# Patient Record
Sex: Female | Born: 1960 | Race: White | Hispanic: No | Marital: Married | Smoking: Former smoker
Health system: Southern US, Community
[De-identification: ages and names within clinical notes are randomized; demographics above are authoritative.]

## PROBLEM LIST (undated history)

## (undated) DIAGNOSIS — G43809 Other migraine, not intractable, without status migrainosus: Secondary | ICD-10-CM

## (undated) DIAGNOSIS — F32A Depression, unspecified: Secondary | ICD-10-CM

## (undated) DIAGNOSIS — M1990 Unspecified osteoarthritis, unspecified site: Secondary | ICD-10-CM

## (undated) DIAGNOSIS — R112 Nausea with vomiting, unspecified: Secondary | ICD-10-CM

## (undated) DIAGNOSIS — E279 Disorder of adrenal gland, unspecified: Secondary | ICD-10-CM

## (undated) DIAGNOSIS — C50919 Malignant neoplasm of unspecified site of unspecified female breast: Secondary | ICD-10-CM

## (undated) DIAGNOSIS — G4730 Sleep apnea, unspecified: Secondary | ICD-10-CM

## (undated) DIAGNOSIS — E039 Hypothyroidism, unspecified: Secondary | ICD-10-CM

## (undated) DIAGNOSIS — M4802 Spinal stenosis, cervical region: Secondary | ICD-10-CM

## (undated) DIAGNOSIS — R42 Dizziness and giddiness: Secondary | ICD-10-CM

## (undated) DIAGNOSIS — Z803 Family history of malignant neoplasm of breast: Secondary | ICD-10-CM

## (undated) DIAGNOSIS — Z8639 Personal history of other endocrine, nutritional and metabolic disease: Secondary | ICD-10-CM

## (undated) DIAGNOSIS — F419 Anxiety disorder, unspecified: Secondary | ICD-10-CM

## (undated) DIAGNOSIS — R7303 Prediabetes: Secondary | ICD-10-CM

## (undated) DIAGNOSIS — M8580 Other specified disorders of bone density and structure, unspecified site: Secondary | ICD-10-CM

## (undated) DIAGNOSIS — IMO0002 Reserved for concepts with insufficient information to code with codable children: Secondary | ICD-10-CM

## (undated) DIAGNOSIS — R251 Tremor, unspecified: Secondary | ICD-10-CM

## (undated) DIAGNOSIS — K219 Gastro-esophageal reflux disease without esophagitis: Secondary | ICD-10-CM

## (undated) DIAGNOSIS — E785 Hyperlipidemia, unspecified: Secondary | ICD-10-CM

## (undated) DIAGNOSIS — C3490 Malignant neoplasm of unspecified part of unspecified bronchus or lung: Secondary | ICD-10-CM

## (undated) DIAGNOSIS — G2581 Restless legs syndrome: Secondary | ICD-10-CM

## (undated) DIAGNOSIS — Z9889 Other specified postprocedural states: Secondary | ICD-10-CM

## (undated) DIAGNOSIS — F319 Bipolar disorder, unspecified: Secondary | ICD-10-CM

## (undated) DIAGNOSIS — E0590 Thyrotoxicosis, unspecified without thyrotoxic crisis or storm: Secondary | ICD-10-CM

## (undated) DIAGNOSIS — E079 Disorder of thyroid, unspecified: Secondary | ICD-10-CM

## (undated) DIAGNOSIS — E278 Other specified disorders of adrenal gland: Secondary | ICD-10-CM

## (undated) HISTORY — DX: Prediabetes: R73.03

## (undated) HISTORY — DX: Hyperlipidemia, unspecified: E78.5

## (undated) HISTORY — DX: Gastro-esophageal reflux disease without esophagitis: K21.9

## (undated) HISTORY — DX: Depression, unspecified: F32.A

## (undated) HISTORY — PX: KNEE ARTHROSCOPY WITH EXCISION BAKER'S CYST: SHX5646

## (undated) HISTORY — PX: TUBAL LIGATION: SHX77

## (undated) HISTORY — PX: LAPAROSCOPY: SHX197

## (undated) HISTORY — DX: Reserved for concepts with insufficient information to code with codable children: IMO0002

## (undated) HISTORY — DX: Other specified disorders of bone density and structure, unspecified site: M85.80

## (undated) HISTORY — PX: CYSTECTOMY: SUR359

## (undated) HISTORY — DX: Disorder of thyroid, unspecified: E07.9

## (undated) HISTORY — PX: OTHER SURGICAL HISTORY: SHX169

## (undated) HISTORY — DX: Family history of malignant neoplasm of breast: Z80.3

## (undated) HISTORY — DX: Spinal stenosis, cervical region: M48.02

## (undated) HISTORY — DX: Sleep apnea, unspecified: G47.30

## (undated) HISTORY — DX: Malignant neoplasm of unspecified site of unspecified female breast: C50.919

## (undated) HISTORY — DX: Bipolar disorder, unspecified: F31.9

## (undated) HISTORY — PX: WISDOM TOOTH EXTRACTION: SHX21

## (undated) HISTORY — PX: BREAST LUMPECTOMY: SHX2

## (undated) HISTORY — PX: ANTERIOR / POSTERIOR COMBINED FUSION CERVICAL SPINE: SUR627

## (undated) HISTORY — DX: Malignant neoplasm of unspecified part of unspecified bronchus or lung: C34.90

## (undated) HISTORY — PX: TOTAL MASTECTOMY: SHX6129

---

## 2004-12-26 ENCOUNTER — Observation Stay (HOSPITAL_COMMUNITY): Admission: AD | Admit: 2004-12-26 | Discharge: 2004-12-27 | Payer: Self-pay | Admitting: Neurosurgery

## 2004-12-26 ENCOUNTER — Emergency Department: Payer: Self-pay | Admitting: Internal Medicine

## 2005-11-23 ENCOUNTER — Ambulatory Visit: Payer: Self-pay

## 2006-02-15 ENCOUNTER — Ambulatory Visit: Payer: Self-pay | Admitting: Unknown Physician Specialty

## 2010-01-27 ENCOUNTER — Ambulatory Visit: Payer: Self-pay | Admitting: Family Medicine

## 2011-02-28 ENCOUNTER — Ambulatory Visit: Payer: Self-pay | Admitting: Family Medicine

## 2011-10-09 ENCOUNTER — Ambulatory Visit: Payer: Self-pay | Admitting: Family Medicine

## 2011-10-16 ENCOUNTER — Ambulatory Visit: Payer: Self-pay | Admitting: Family Medicine

## 2012-02-29 ENCOUNTER — Ambulatory Visit: Payer: Self-pay | Admitting: Family Medicine

## 2012-04-24 ENCOUNTER — Ambulatory Visit: Payer: Self-pay | Admitting: Orthopedic Surgery

## 2013-04-09 ENCOUNTER — Ambulatory Visit: Payer: Self-pay | Admitting: Family Medicine

## 2013-04-28 ENCOUNTER — Ambulatory Visit: Payer: Self-pay | Admitting: Gastroenterology

## 2014-05-21 ENCOUNTER — Ambulatory Visit: Payer: Self-pay | Admitting: Family Medicine

## 2014-09-15 DIAGNOSIS — G5600 Carpal tunnel syndrome, unspecified upper limb: Secondary | ICD-10-CM | POA: Insufficient documentation

## 2014-11-17 ENCOUNTER — Other Ambulatory Visit: Payer: Self-pay | Admitting: Neurosurgery

## 2014-11-27 ENCOUNTER — Ambulatory Visit
Admission: RE | Admit: 2014-11-27 | Discharge: 2014-11-27 | Disposition: A | Discharge status: Home / Self Care | Payer: Commercial Managed Care - HMO | Source: Ambulatory Visit | Attending: Neurosurgery | Admitting: Neurosurgery

## 2014-11-27 ENCOUNTER — Other Ambulatory Visit: Payer: Self-pay | Admitting: Neurosurgery

## 2014-11-27 DIAGNOSIS — M7989 Other specified soft tissue disorders: Secondary | ICD-10-CM | POA: Insufficient documentation

## 2014-11-27 DIAGNOSIS — Z9889 Other specified postprocedural states: Secondary | ICD-10-CM | POA: Diagnosis not present

## 2014-11-27 DIAGNOSIS — M542 Cervicalgia: Secondary | ICD-10-CM

## 2014-11-27 DIAGNOSIS — M47816 Spondylosis without myelopathy or radiculopathy, lumbar region: Secondary | ICD-10-CM | POA: Diagnosis present

## 2014-11-27 DIAGNOSIS — M4722 Other spondylosis with radiculopathy, cervical region: Secondary | ICD-10-CM

## 2014-12-24 DIAGNOSIS — G5620 Lesion of ulnar nerve, unspecified upper limb: Secondary | ICD-10-CM | POA: Insufficient documentation

## 2015-01-19 ENCOUNTER — Other Ambulatory Visit: Payer: Self-pay | Admitting: Family Medicine

## 2015-01-20 ENCOUNTER — Other Ambulatory Visit: Payer: Self-pay | Admitting: Family Medicine

## 2015-01-26 NOTE — Telephone Encounter (Signed)
Has been out. Rx sent to her pharmacy.

## 2015-02-18 ENCOUNTER — Other Ambulatory Visit: Payer: Self-pay | Admitting: Family Medicine

## 2015-03-12 ENCOUNTER — Ambulatory Visit
Admission: RE | Admit: 2015-03-12 | Discharge: 2015-03-12 | Disposition: A | Discharge status: Home / Self Care | Payer: Commercial Managed Care - HMO | Source: Ambulatory Visit | Attending: Neurosurgery | Admitting: Neurosurgery

## 2015-03-12 ENCOUNTER — Other Ambulatory Visit: Payer: Self-pay | Admitting: Neurosurgery

## 2015-03-12 DIAGNOSIS — Z981 Arthrodesis status: Secondary | ICD-10-CM | POA: Insufficient documentation

## 2015-03-12 DIAGNOSIS — M542 Cervicalgia: Secondary | ICD-10-CM

## 2015-03-23 ENCOUNTER — Encounter: Payer: Self-pay | Admitting: Family Medicine

## 2015-03-23 ENCOUNTER — Ambulatory Visit (INDEPENDENT_AMBULATORY_CARE_PROVIDER_SITE_OTHER): Payer: Commercial Managed Care - HMO | Admitting: Family Medicine

## 2015-03-23 VITALS — BP 138/73 | HR 88 | Temp 99.2°F | Ht 64.0 in | Wt 193.0 lb

## 2015-03-23 DIAGNOSIS — E039 Hypothyroidism, unspecified: Secondary | ICD-10-CM

## 2015-03-23 DIAGNOSIS — E785 Hyperlipidemia, unspecified: Secondary | ICD-10-CM | POA: Diagnosis not present

## 2015-03-23 DIAGNOSIS — E0590 Thyrotoxicosis, unspecified without thyrotoxic crisis or storm: Secondary | ICD-10-CM | POA: Insufficient documentation

## 2015-03-23 DIAGNOSIS — F3181 Bipolar II disorder: Secondary | ICD-10-CM | POA: Diagnosis not present

## 2015-03-23 DIAGNOSIS — R300 Dysuria: Secondary | ICD-10-CM

## 2015-03-23 DIAGNOSIS — Z79899 Other long term (current) drug therapy: Secondary | ICD-10-CM | POA: Diagnosis not present

## 2015-03-23 DIAGNOSIS — J0110 Acute frontal sinusitis, unspecified: Secondary | ICD-10-CM

## 2015-03-23 LAB — CBC WITH DIFFERENTIAL/PLATELET
HEMATOCRIT: 38.7 % (ref 34.0–46.6)
HEMOGLOBIN: 13.2 g/dL (ref 11.1–15.9)
Lymphocytes Absolute: 1.5 10*3/uL (ref 0.7–3.1)
Lymphs: 23 %
MCH: 30.9 pg (ref 26.6–33.0)
MCHC: 34.1 g/dL (ref 31.5–35.7)
MCV: 91 fL (ref 79–97)
MID (Absolute): 0.5 10*3/uL (ref 0.1–1.6)
MID: 8 %
NEUTROS PCT: 69 %
Neutrophils Absolute: 4.5 10*3/uL (ref 1.4–7.0)
Platelets: 250 10*3/uL (ref 150–379)
RBC: 4.27 x10E6/uL (ref 3.77–5.28)
RDW: 14.3 % (ref 12.3–15.4)
WBC: 6.5 10*3/uL (ref 3.4–10.8)

## 2015-03-23 LAB — MICROSCOPIC EXAMINATION: Renal Epithel, UA: NONE SEEN /hpf

## 2015-03-23 LAB — MICROALBUMIN, URINE WAIVED
Creatinine, Urine Waived: 100 mg/dL (ref 10–300)
Microalb, Ur Waived: 10 mg/L (ref 0–19)
Microalb/Creat Ratio: <30 mg/g (ref <30)

## 2015-03-23 LAB — LIPID PANEL PICCOLO, WAIVED
CHOL/HDL RATIO PICCOLO,WAIVE: 3.2 mg/dL
Cholesterol Piccolo, Waived: 146 mg/dL (ref <200)
HDL Chol Piccolo, Waived: 45 mg/dL — ABNORMAL LOW (ref >59)
LDL CHOL CALC PICCOLO WAIVED: 75 mg/dL (ref <100)
Triglycerides Piccolo,Waived: 127 mg/dL (ref <150)
VLDL Chol Calc Piccolo,Waive: 25 mg/dL (ref <30)

## 2015-03-23 MED ORDER — QUETIAPINE FUMARATE 100 MG PO TABS: 150.0000 mg | ORAL_TABLET | Freq: Every day | ORAL | Status: DC

## 2015-03-23 MED ORDER — PREDNISONE 10 MG PO TABS: 40.0000 mg | ORAL_TABLET | Freq: Every day | ORAL | Status: DC

## 2015-03-23 MED ORDER — AMOXICILLIN-POT CLAVULANATE 875-125 MG PO TABS
1.0000 | ORAL_TABLET | Freq: Two times a day (BID) | ORAL | Status: DC
Start: 2015-03-23 — End: 2015-04-20

## 2015-03-23 MED ORDER — TRAMADOL HCL 50 MG PO TABS: 50.0000 mg | ORAL_TABLET | Freq: Four times a day (QID) | ORAL | Status: DC | PRN

## 2015-03-23 NOTE — Assessment & Plan Note (Signed)
Under good control. Continue current regimen. Recheck 6 months.

## 2015-03-23 NOTE — Assessment & Plan Note (Signed)
Rechecking levels today. Will adjust as needed pending results.

## 2015-03-23 NOTE — Progress Notes (Signed)
BP 138/73 mmHg  Pulse 88  Temp(Src) 99.2 F (37.3 C)  Ht '5\' 4"'$  (1.626 m)  Wt 193 lb (87.544 kg)  BMI 33.11 kg/m2  SpO2 95%   Subjective:    Patient ID: Carol Becker, female    DOB: 1961-03-09, 54 y.o.   MRN: [id number]  HPI: Carol Becker is a 54 y.o. female  Chief Complaint  Patient presents with  . Hypothyroidism  . Hyperlipidemia   UPPER RESPIRATORY TRACT INFECTION x5 days Worst symptom: fatigue Fever: yes Cough: no Shortness of breath: yes Wheezing: no Chest pain: no Chest tightness: no Chest congestion: no Nasal congestion: yes Runny nose: no Post nasal drip: yes Sneezing: no Sore throat: yes- gone now Swollen glands: no Sinus pressure: no Headache: no Face pain: no Toothache: no Ear pain: no  Ear pressure: no  Eyes red/itching:no Eye drainage/crusting: yes  Vomiting: no Rash: no Fatigue: yes Sick contacts: yes Strep contacts: no  Context: stable Recurrent sinusitis: no Relief with OTC cold/cough medications: no  Treatments attempted: none   HYPOTHYROIDISM Thyroid control status:stable Satisfied with current treatment? yes Medication side effects: no Medication compliance: good compliance Recent dose adjustment:no Fatigue: yes Cold intolerance: no Heat intolerance: no Weight gain: yes Weight loss: no Constipation: yes Diarrhea/loose stools: no Palpitations: no Lower extremity edema: no Anxiety/depressed mood: no  HYPERLIPIDEMIA Hyperlipidemia status: excellent compliance Satisfied with current treatment?  yes Side effects:  no Medication compliance: excellent compliance Supplements: none Aspirin:  no Chest pain:  no Coronary artery disease:  no Family history CAD:  yes  Relevant past medical, surgical, family and social history reviewed and updated as indicated. Interim medical history since our last visit reviewed. Allergies and medications reviewed and updated.  Review of Systems  Constitutional: Negative.   HENT:  Positive for congestion.   Respiratory: Negative.   Cardiovascular: Negative.   Gastrointestinal: Negative.   Musculoskeletal:       Leg and feet cramps  Psychiatric/Behavioral: Negative.    Per HPI unless specifically indicated above     Objective:    BP 138/73 mmHg  Pulse 88  Temp(Src) 99.2 F (37.3 C)  Ht '5\' 4"'$  (1.626 m)  Wt 193 lb (87.544 kg)  BMI 33.11 kg/m2  SpO2 95%  Wt Readings from Last 3 Encounters:  03/23/15 193 lb (87.544 kg)  09/11/14 193 lb (87.544 kg)    Physical Exam  Constitutional: She is oriented to person, place, and time. She appears well-developed and well-nourished. No distress.  HENT:  Head: Normocephalic and atraumatic.  Right Ear: Hearing normal.  Left Ear: Hearing normal.  Nose: Nose normal.  Eyes: Conjunctivae and lids are normal. Right eye exhibits no discharge. Left eye exhibits no discharge. No scleral icterus.  Cardiovascular: Normal rate, regular rhythm, normal heart sounds and intact distal pulses.  Exam reveals no gallop and no friction rub.   No murmur heard. Pulmonary/Chest: Effort normal and breath sounds normal. No respiratory distress. She has no wheezes. She has no rales. She exhibits no tenderness.  Musculoskeletal: Normal range of motion.  Neurological: She is alert and oriented to person, place, and time.  Skin: Skin is warm, dry and intact. No rash noted. No erythema. No pallor.  Psychiatric: She has a normal mood and affect. Her speech is normal and behavior is normal. Judgment and thought content normal. Cognition and memory are normal.  Nursing note and vitals reviewed.   No results found for this or any previous visit.    Assessment & Plan:  Problem List Items Addressed This Visit      Endocrine   Thyroid activity decreased    Rechecking levels today. Will adjust as needed pending results.       Relevant Orders   TSH   Comprehensive metabolic panel   CBC With Differential/Platelet   UA/M w/rflx Culture,  Routine     Other   Hyperlipidemia    Under good control. Continue current regimen. Recheck 6 months.       Relevant Orders   Comprehensive metabolic panel   Lipid Panel Piccolo, Waived   CBC With Differential/Platelet   UA/M w/rflx Culture, Routine   Bipolar 2 disorder    Under good control with current regimen. Continue current regimen. Refills given today.        Other Visit Diagnoses    Acute frontal sinusitis, recurrence not specified    -  Primary    Will treat with prednisone burst. If not better in 2-3 days, will start augmentin.     Relevant Medications    amoxicillin-clavulanate (AUGMENTIN) 875-125 MG per tablet    predniSONE (DELTASONE) 10 MG tablet    Long-term use of high-risk medication        Will check CMP due to seroquel.     Relevant Orders    Microalbumin, Urine Waived    CBC With Differential/Platelet    UA/M w/rflx Culture, Routine    Dysuria        Await culture. If + and susceptible to amoxicillin, will treat with augmentin for sinusitis.         Follow up plan: Return in about 6 months (around 09/20/2015) for Physical.

## 2015-03-23 NOTE — Assessment & Plan Note (Signed)
Under good control with current regimen. Continue current regimen. Refills given today.

## 2015-03-24 ENCOUNTER — Telehealth: Payer: Self-pay | Admitting: Family Medicine

## 2015-03-24 DIAGNOSIS — R7301 Impaired fasting glucose: Secondary | ICD-10-CM

## 2015-03-24 DIAGNOSIS — R7303 Prediabetes: Secondary | ICD-10-CM

## 2015-03-24 LAB — COMPREHENSIVE METABOLIC PANEL
ALBUMIN: 4.3 g/dL (ref 3.5–5.5)
ALT: 26 IU/L (ref 0–32)
AST: 21 IU/L (ref 0–40)
Albumin/Globulin Ratio: 1.5 (ref 1.1–2.5)
Alkaline Phosphatase: 79 IU/L (ref 39–117)
BILIRUBIN TOTAL: 0.3 mg/dL (ref 0.0–1.2)
BUN / CREAT RATIO: 12 (ref 9–23)
BUN: 11 mg/dL (ref 6–24)
CO2: 24 mmol/L (ref 18–29)
CREATININE: 0.94 mg/dL (ref 0.57–1.00)
Calcium: 9.7 mg/dL (ref 8.7–10.2)
Chloride: 99 mmol/L (ref 97–108)
GFR calc non Af Amer: 69 mL/min/{1.73_m2} (ref >59)
GFR, EST AFRICAN AMERICAN: 80 mL/min/{1.73_m2} (ref >59)
GLUCOSE: 144 mg/dL — AB (ref 65–99)
Globulin, Total: 2.8 g/dL (ref 1.5–4.5)
Potassium: 4.4 mmol/L (ref 3.5–5.2)
Sodium: 138 mmol/L (ref 134–144)
TOTAL PROTEIN: 7.1 g/dL (ref 6.0–8.5)

## 2015-03-24 LAB — TSH: TSH: 3.08 u[IU]/mL (ref 0.450–4.500)

## 2015-03-24 MED ORDER — LEVOTHYROXINE SODIUM 25 MCG PO TABS: 37.5000 ug | ORAL_TABLET | Freq: Every day | ORAL | Status: DC

## 2015-03-24 NOTE — Telephone Encounter (Signed)
Please let Carol Becker know that her labs came back normal except an elevated sugar- but I don't think she was fasting, if she was, we need to recheck an A1c, so let me know and I'll add it on (she shouldn't need to come in). Her thyroid was right around where it was, so I sent in a years supply for her. Thanks!

## 2015-03-24 NOTE — Telephone Encounter (Signed)
Spoke to patient. A1c added on in office and came back at 6.1. Appt cancelled for tomorrow. Went over results and mailing patient information about diet with prediabetes. Continue to monitor. Recheck A1c in 6 months.

## 2015-03-24 NOTE — Telephone Encounter (Signed)
All she had was some almond milk in her coffee. She will come in for a A1C, appointment is scheduled for 8:00am on 03/25/15. I placed the order for the A1C  FYI: Daughter needed a doctor, she recommended you and she will be coming in on 04/06/15 for a new patient appointment with you.

## 2015-03-24 NOTE — Patient Instructions (Signed)
Diabetes Mellitus and Food It is important for you to manage your blood sugar (glucose) level. Your blood glucose level can be greatly affected by what you eat. Eating healthier foods in the appropriate amounts throughout the day at about the same time each day will help you control your blood glucose level. It can also help slow or prevent worsening of your diabetes mellitus. Healthy eating may even help you improve the level of your blood pressure and reach or maintain a healthy weight.  HOW CAN FOOD AFFECT ME? Carbohydrates Carbohydrates affect your blood glucose level more than any other type of food. Your dietitian will help you determine how many carbohydrates to eat at each meal and teach you how to count carbohydrates. Counting carbohydrates is important to keep your blood glucose at a healthy level, especially if you are using insulin or taking certain medicines for diabetes mellitus. Alcohol Alcohol can cause sudden decreases in blood glucose (hypoglycemia), especially if you use insulin or take certain medicines for diabetes mellitus. Hypoglycemia can be a life-threatening condition. Symptoms of hypoglycemia (sleepiness, dizziness, and disorientation) are similar to symptoms of having too much alcohol.  If your health care provider has given you approval to drink alcohol, do so in moderation and use the following guidelines:  Women should not have more than one drink per day, and men should not have more than two drinks per day. One drink is equal to:  12 oz of beer.  5 oz of wine.  1 oz of hard liquor.  Do not drink on an empty stomach.  Keep yourself hydrated. Have water, diet soda, or unsweetened iced tea.  Regular soda, juice, and other mixers might contain a lot of carbohydrates and should be counted. WHAT FOODS ARE NOT RECOMMENDED? As you make food choices, it is important to remember that all foods are not the same. Some foods have fewer nutrients per serving than other  foods, even though they might have the same number of calories or carbohydrates. It is difficult to get your body what it needs when you eat foods with fewer nutrients. Examples of foods that you should avoid that are high in calories and carbohydrates but low in nutrients include:  Trans fats (most processed foods list trans fats on the Nutrition Facts label).  Regular soda.  Juice.  Candy.  Sweets, such as cake, pie, doughnuts, and cookies.  Fried foods. WHAT FOODS CAN I EAT? Have nutrient-rich foods, which will nourish your body and keep you healthy. The food you should eat also will depend on several factors, including:  The calories you need.  The medicines you take.  Your weight.  Your blood glucose level.  Your blood pressure level.  Your cholesterol level. You also should eat a variety of foods, including:  Protein, such as meat, poultry, fish, tofu, nuts, and seeds (lean animal proteins are best).  Fruits.  Vegetables.  Dairy products, such as milk, cheese, and yogurt (low fat is best).  Breads, grains, pasta, cereal, rice, and beans.  Fats such as olive oil, trans fat-free margarine, canola oil, avocado, and olives. DOES EVERYONE WITH DIABETES MELLITUS HAVE THE SAME MEAL PLAN? Because every person with diabetes mellitus is different, there is not one meal plan that works for everyone. It is very important that you meet with a dietitian who will help you create a meal plan that is just right for you. Document Released: 03/23/2005 Document Revised: 07/01/2013 Document Reviewed: 05/23/2013 [employer] Patient Information 2015 [employer]. This   information is not intended to replace advice given to you by your health care provider. Make sure you discuss any questions you have with your health care provider. Basic Carbohydrate Counting for Diabetes Mellitus Carbohydrate counting is a method for keeping track of the amount of carbohydrates you eat. Eating  carbohydrates naturally increases the level of sugar (glucose) in your blood, so it is important for you to know the amount that is okay for you to have in every meal. Carbohydrate counting helps keep the level of glucose in your blood within normal limits. The amount of carbohydrates allowed is different for every person. A dietitian can help you calculate the amount that is right for you. Once you know the amount of carbohydrates you can have, you can count the carbohydrates in the foods you want to eat. Carbohydrates are found in the following foods:  Grains, such as breads and cereals.  Dried beans and soy products.  Starchy vegetables, such as potatoes, peas, and corn.  Fruit and fruit juices.  Milk and yogurt.  Sweets and snack foods, such as cake, cookies, candy, chips, soft drinks, and fruit drinks. CARBOHYDRATE COUNTING There are two ways to count the carbohydrates in your food. You can use either of the methods or a combination of both. Reading the "Nutrition Facts" on [place] The "Nutrition Facts" is an area that is included on the labels of almost all packaged food and beverages in the [place]. It includes the serving size of that food or beverage and information about the nutrients in each serving of the food, including the grams (g) of carbohydrate per serving.  Decide the number of servings of this food or beverage that you will be able to eat or drink. Multiply that number of servings by the number of grams of carbohydrate that is listed on the label for that serving. The total will be the amount of carbohydrates you will be having when you eat or drink this food or beverage. Learning Standard Serving Sizes of Food When you eat food that is not packaged or does not include "Nutrition Facts" on the label, you need to measure the servings in order to count the amount of carbohydrates.A serving of most carbohydrate-rich foods contains about 15 g of carbohydrates. The  following list includes serving sizes of carbohydrate-rich foods that provide 15 g ofcarbohydrate per serving:   1 slice of bread (1 oz) or 1 six-inch tortilla.    of a hamburger bun or English muffin.  4-6 crackers.   cup unsweetened dry cereal.    cup hot cereal.   cup rice or pasta.    cup mashed potatoes or  of a large baked potato.  1 cup fresh fruit or one small piece of fruit.    cup canned or frozen fruit or fruit juice.  1 cup milk.   cup plain fat-free yogurt or yogurt sweetened with artificial sweeteners.   cup cooked dried beans or starchy vegetable, such as peas, corn, or potatoes.  Decide the number of standard-size servings that you will eat. Multiply that number of servings by 15 (the grams of carbohydrates in that serving). For example, if you eat 2 cups of strawberries, you will have eaten 2 servings and 30 g of carbohydrates (2 servings x 15 g = 30 g). For foods such as soups and casseroles, in which more than one food is mixed in, you will need to count the carbohydrates in each food that is included. EXAMPLE OF CARBOHYDRATE  COUNTING Sample Dinner  3 oz chicken breast.   cup of brown rice.   cup of corn.  1 cup milk.   1 cup strawberries with sugar-free whipped topping.  Carbohydrate Calculation Step 1: Identify the foods that contain carbohydrates:   Rice.   Corn.   Milk.   Strawberries. Step 2:Calculate the number of servings eaten of each:   2 servings of rice.   1 serving of corn.   1 serving of milk.   1 serving of strawberries. Step 3: Multiply each of those number of servings by 15 g:   2 servings of rice x 15 g = 30 g.   1 serving of corn x 15 g = 15 g.   1 serving of milk x 15 g = 15 g.   1 serving of strawberries x 15 g = 15 g. Step 4: Add together all of the amounts to find the total grams of carbohydrates eaten: 30 g + 15 g + 15 g + 15 g = 75 g. Document Released: 06/26/2005 Document  Revised: 11/10/2013 Document Reviewed: 05/23/2013 [facility] Patient Information 2015 [place], [place]. This information is not intended to replace advice given to you by your health care provider. Make sure you discuss any questions you have with your health care provider.

## 2015-03-25 ENCOUNTER — Other Ambulatory Visit: Payer: Self-pay

## 2015-03-25 LAB — UA/M W/RFLX CULTURE, ROUTINE

## 2015-03-26 ENCOUNTER — Other Ambulatory Visit: Payer: Self-pay | Admitting: Family Medicine

## 2015-03-26 DIAGNOSIS — R7301 Impaired fasting glucose: Secondary | ICD-10-CM

## 2015-03-26 LAB — BAYER DCA HB A1C WAIVED: HB A1C (BAYER DCA - WAIVED): 6.1 % (ref <7.0)

## 2015-04-12 ENCOUNTER — Encounter: Payer: Commercial Managed Care - HMO | Admitting: Family Medicine

## 2015-04-20 ENCOUNTER — Encounter: Payer: Self-pay | Admitting: Family Medicine

## 2015-04-20 ENCOUNTER — Ambulatory Visit (INDEPENDENT_AMBULATORY_CARE_PROVIDER_SITE_OTHER): Payer: Commercial Managed Care - HMO | Admitting: Family Medicine

## 2015-04-20 VITALS — BP 126/80 | HR 85 | Temp 98.0°F | Ht 64.3 in | Wt 197.0 lb

## 2015-04-20 DIAGNOSIS — Z1239 Encounter for other screening for malignant neoplasm of breast: Secondary | ICD-10-CM

## 2015-04-20 DIAGNOSIS — E669 Obesity, unspecified: Secondary | ICD-10-CM | POA: Diagnosis not present

## 2015-04-20 DIAGNOSIS — Z0000 Encounter for general adult medical examination without abnormal findings: Secondary | ICD-10-CM | POA: Diagnosis not present

## 2015-04-20 DIAGNOSIS — Z23 Encounter for immunization: Secondary | ICD-10-CM | POA: Diagnosis not present

## 2015-04-20 DIAGNOSIS — Z6835 Body mass index (BMI) 35.0-35.9, adult: Secondary | ICD-10-CM

## 2015-04-20 MED ORDER — LIRAGLUTIDE -WEIGHT MANAGEMENT 18 MG/3ML ~~LOC~~ SOPN: 0.6000 mg | PEN_INJECTOR | Freq: Every day | SUBCUTANEOUS | Status: DC

## 2015-04-20 NOTE — Assessment & Plan Note (Signed)
Has really worked on diet and exercise. Stable to gaining weight. Has comorbidities. Will treat with saxenda if covered and follow up in 1 month.

## 2015-04-20 NOTE — Progress Notes (Signed)
BP 126/80 mmHg  Pulse 85  Temp(Src) 98 F (36.7 C)  Ht 5' 4.3" (1.633 m)  Wt 197 lb (89.359 kg)  BMI 33.51 kg/m2  SpO2 93%   Subjective:    Patient ID: Carol Becker, female    DOB: 1961-01-17, 54 y.o.   MRN: [id number]  HPI: Carol Becker is a 54 y.o. female presenting on 04/20/2015 for comprehensive medical examination. Current medical complaints include: Scratchy throat starting today.   UPPER RESPIRATORY TRACT INFECTION Worst symptom: sore throat Fever: no Cough: yes Shortness of breath: no Wheezing: no Chest pain: no Chest tightness: no Chest congestion: no Nasal congestion: yes Runny nose: yes Post nasal drip: yes Sneezing: no Sore throat: yes Swollen glands: no Sinus pressure: no Headache: no Face pain: no Toothache: no Ear pain: no  Ear pressure: no  Eyes red/itching:no Eye drainage/crusting: no  Vomiting: no Rash: no Fatigue: no Sick contacts: yes Strep contacts: yes  Context: stable Recurrent sinusitis: no Relief with OTC cold/cough medications: no  Treatments attempted: none    She currently lives with: Menopausal Symptoms: no  Depression Screen done today and results listed below:  No flowsheet data found.  The patient does not have a history of falls. I did not complete a risk assessment for falls. A plan of care for falls was not documented.   Past Medical History:  Past Medical History  Diagnosis Date  . Sleep apnea   . Bipolar 1 disorder ([place])   . Osteopenia   . Hyperlipidemia   . Cervical spinal stenosis   . GERD (gastroesophageal reflux disease)   . Thyroid disease     Surgical History:  Past Surgical History  Procedure Laterality Date  . Tubal ligation    . Knee arthroscopy with excision baker's cyst    . Anterior / posterior combined fusion cervical spine      Medications:  Current Outpatient Prescriptions on File Prior to Visit  Medication Sig  . gabapentin (NEURONTIN) 600 MG tablet   .  HYDROcodone-acetaminophen (NORCO) 10-325 MG per tablet TAKE 1/2-1 TABLET BY ORAL ROUTE EVERY 5 HOURS AS NEEDED FOR PAIN  . levothyroxine (SYNTHROID, LEVOTHROID) 25 MCG tablet Take 1.5 tablets (37.5 mcg total) by mouth daily.  . QUEtiapine (SEROQUEL) 100 MG tablet Take 1.5 tablets (150 mg total) by mouth at bedtime.  . simvastatin (ZOCOR) 20 MG tablet TAKE 1 TABLET BY MOUTH EVERY DAY  . traMADol (ULTRAM) 50 MG tablet Take 1 tablet (50 mg total) by mouth every 6 (six) hours as needed. Take1-2 tablets every 6 hours as needed   No current facility-administered medications on file prior to visit.    Allergies:  Allergies  Allergen Reactions  . Codeine Phosphate [Codeine] Other (See Comments)    Jittery  . Crestor [Rosuvastatin Calcium] Other (See Comments)    Indigestion  . Sulfa Antibiotics Nausea Only  . Zithromax [Azithromycin] Other (See Comments)    Not effective    Social History:  Social History   Social History  . Marital Status: Married    Spouse Name: N/A  . Number of Children: N/A  . Years of Education: N/A   Occupational History  . Not on file.   Social History Main Topics  . Smoking status: Former Smoker    Quit date: 04/06/2011  . Smokeless tobacco: Never Used  . Alcohol Use: Yes     Comment: On Occasion  . Drug Use: No  . Sexual Activity: Yes    Birth Control/  Protection: Post-menopausal   Other Topics Concern  . Not on file   Social History Narrative   History  Smoking status  . Former Smoker  . Quit date: 04/06/2011  Smokeless tobacco  . Never Used   History  Alcohol Use  . Yes    Comment: On Occasion    Family History:  Family History  Problem Relation Age of Onset  . Heart disease Father     CAD  . Heart disease Sister 34    CAD  . Cancer Maternal Grandmother     breast  . Cancer Paternal Grandmother     breast  . Alzheimer's disease Maternal Grandfather     Past medical history, surgical history, medications, allergies, family  history and social history reviewed with patient today and changes made to appropriate areas of the chart.   Review of Systems  Constitutional: Negative.   HENT: Negative.   Eyes: Negative.   Respiratory: Negative.   Cardiovascular: Negative.   Gastrointestinal: Positive for constipation. Negative for heartburn, nausea, vomiting, abdominal pain, diarrhea, blood in stool and melena.  Genitourinary: Negative.   Musculoskeletal: Negative.   Skin: Negative.   Neurological: Negative.   Endo/Heme/Allergies: Negative.   Psychiatric/Behavioral: Negative.     All other ROS negative except what is listed above and in the HPI.      Objective:    BP 126/80 mmHg  Pulse 85  Temp(Src) 98 F (36.7 C)  Ht 5' 4.3" (1.633 m)  Wt 197 lb (89.359 kg)  BMI 33.51 kg/m2  SpO2 93%  Wt Readings from Last 3 Encounters:  04/20/15 197 lb (89.359 kg)  03/23/15 193 lb (87.544 kg)  09/11/14 193 lb (87.544 kg)    Physical Exam  Constitutional: She is oriented to person, place, and time. She appears well-developed and well-nourished. No distress.  HENT:  Head: Normocephalic and atraumatic.  Right Ear: Hearing and external ear normal.  Left Ear: Hearing and external ear normal.  Nose: Nose normal.  Mouth/Throat: Oropharynx is clear and moist. No oropharyngeal exudate.  Eyes: Conjunctivae and lids are normal. Pupils are equal, round, and reactive to light. Right eye exhibits no discharge. Left eye exhibits no discharge. No scleral icterus.  Neck: Normal range of motion. Neck supple. No JVD present. No tracheal deviation present. No thyromegaly present.  Cardiovascular: Normal rate, regular rhythm, normal heart sounds and intact distal pulses.  Exam reveals no gallop and no friction rub.   No murmur heard. Pulmonary/Chest: Effort normal and breath sounds normal. No stridor. No respiratory distress. She has no wheezes. She has no rales. She exhibits no tenderness. Right breast exhibits no inverted nipple, no  mass, no nipple discharge, no skin change and no tenderness. Left breast exhibits no inverted nipple, no mass, no nipple discharge, no skin change and no tenderness. Breasts are symmetrical.  Abdominal: Soft. Bowel sounds are normal. She exhibits no distension and no mass. There is no tenderness. There is no rebound and no guarding.  Genitourinary:  Deferred with shared decision making  Musculoskeletal: Normal range of motion. She exhibits no edema or tenderness.  Lymphadenopathy:    She has no cervical adenopathy.  Neurological: She is alert and oriented to person, place, and time. She has normal reflexes. She displays normal reflexes. No cranial nerve deficit. She exhibits normal muscle tone. Coordination normal.  Skin: Skin is warm, dry and intact. No rash noted. She is not diaphoretic. No erythema. No pallor.  Psychiatric: She has a normal mood and affect.  Her speech is normal and behavior is normal. Judgment and thought content normal. Cognition and memory are normal.  Nursing note and vitals reviewed.   Results for orders placed or performed in visit on 03/23/15  Microscopic Examination  Result Value Ref Range   WBC, UA 0-5 0 -  5 /hpf   RBC, UA 0-2 0 -  2 /hpf   Epithelial Cells (non renal) 0-10 0 - 10 /hpf   Renal Epithel, UA None seen None seen /hpf   Casts Present (A) None seen /lpf   Cast Type Hyaline casts N/A   Mucus, UA Present Not Estab.   Bacteria, UA Few None seen/Few  TSH  Result Value Ref Range   TSH 3.080 0.450 - 4.500 uIU/mL  Microalbumin, Urine Waived  Result Value Ref Range   Microalb, Ur Waived 10 0 - 19 mg/L   Creatinine, Urine Waived 100 10 - 300 mg/dL   Microalb/Creat Ratio <30 <30 mg/g  Comprehensive metabolic panel  Result Value Ref Range   Glucose 144 (H) 65 - 99 mg/dL   BUN 11 6 - 24 mg/dL   Creatinine, Ser 0.94 0.57 - 1.00 mg/dL   GFR calc non Af Amer 69 >59 mL/min/1.73   GFR calc Af Amer 80 >59 mL/min/1.73   BUN/Creatinine Ratio 12 9 - 23    Sodium 138 134 - 144 mmol/L   Potassium 4.4 3.5 - 5.2 mmol/L   Chloride 99 97 - 108 mmol/L   CO2 24 18 - 29 mmol/L   Calcium 9.7 8.7 - 10.2 mg/dL   Total Protein 7.1 6.0 - 8.5 g/dL   Albumin 4.3 3.5 - 5.5 g/dL   Globulin, Total 2.8 1.5 - 4.5 g/dL   Albumin/Globulin Ratio 1.5 1.1 - 2.5   Bilirubin Total 0.3 0.0 - 1.2 mg/dL   Alkaline Phosphatase 79 39 - 117 IU/L   AST 21 0 - 40 IU/L   ALT 26 0 - 32 IU/L  Lipid Panel Piccolo, Waived  Result Value Ref Range   Cholesterol Piccolo, Waived 146 <200 mg/dL   HDL Chol Piccolo, Waived 45 (L) >59 mg/dL   Triglycerides Piccolo,Waived 127 <150 mg/dL   Chol/HDL Ratio Piccolo,Waive 3.2 mg/dL   LDL Chol Calc Piccolo Waived 75 <100 mg/dL   VLDL Chol Calc Piccolo,Waive 25 <30 mg/dL  CBC With Differential/Platelet  Result Value Ref Range   WBC 6.5 3.4 - 10.8 x10E3/uL   RBC 4.27 3.77 - 5.28 x10E6/uL   Hemoglobin 13.2 11.1 - 15.9 g/dL   Hematocrit 38.7 34.0 - 46.6 %   MCV 91 79 - 97 fL   MCH 30.9 26.6 - 33.0 pg   MCHC 34.1 31.5 - 35.7 g/dL   RDW 14.3 12.3 - 15.4 %   Platelets 250 150 - 379 x10E3/uL   Neutrophils 69 %   Lymphs 23 %   MID 8 %   Neutrophils Absolute 4.5 1.4 - 7.0 x10E3/uL   Lymphocytes Absolute 1.5 0.7 - 3.1 x10E3/uL   MID (Absolute) 0.5 0.1 - 1.6 X10E3/uL  UA/M w/rflx Culture, Routine  Result Value Ref Range   Urine Culture, Routine Final report    Urine Culture result 1 Comment   Bayer DCA Hb A1c Waived  Result Value Ref Range   Bayer DCA Hb A1c Waived 6.1 <7.0 %      Assessment & Plan:   Problem List Items Addressed This Visit      Other   Obesity    Has really worked on diet  and exercise. Stable to gaining weight. Has comorbidities. Will treat with saxenda if covered and follow up in 1 month.       Relevant Medications   Liraglutide -Weight Management (SAXENDA) 18 MG/3ML SOPN    Other Visit Diagnoses    Routine general medical examination at a health care facility    -  Primary    Up to date on pap and  mammogram and colonoscopy. Up to date on tdap, flu today, pneumovax refused. Screening labs last visit. Continue diet and exercise.     Immunization due        Flu shot given today.     Relevant Orders    Flu Vaccine QUAD 36+ mos PF IM (Fluarix & Fluzone Quad PF) (Completed)    Screening for breast cancer        Mammogram due in November. Ordered today.    Relevant Orders    MM DIGITAL SCREENING BILATERAL        Follow up plan: Return in about 1 month (around 05/21/2015) for follow up weight.   LABORATORY TESTING:  - Pap smear: up to date- due July 2020  IMMUNIZATIONS:   - Tdap: Tetanus vaccination status reviewed: last tetanus booster within 10 years. - Influenza: Up to date - Pneumovax: Refused  SCREENING: -Mammogram: Ordered today  - Colonoscopy: Up to date   PATIENT COUNSELING:   Advised to take 1 mg of folate supplement per day if capable of pregnancy.   Sexuality: Discussed sexually transmitted diseases, partner selection, use of condoms, avoidance of unintended pregnancy  and contraceptive alternatives.   Advised to avoid cigarette smoking.  I discussed with the patient that most people either abstain from alcohol or drink within safe limits (<=14/week and <=4 drinks/occasion for males, <=7/weeks and <= 3 drinks/occasion for females) and that the risk for alcohol disorders and other health effects rises proportionally with the number of drinks per week and how often a drinker exceeds daily limits.  Discussed cessation/primary prevention of drug use and availability of treatment for abuse.   Diet: Encouraged to adjust caloric intake to maintain  or achieve ideal body weight, to reduce intake of dietary saturated fat and total fat, to limit sodium intake by avoiding high sodium foods and not adding table salt, and to maintain adequate dietary potassium and calcium preferably from fresh fruits, vegetables, and low-fat dairy products.    stressed the importance of regular  exercise  Injury prevention: Discussed safety belts, safety helmets, smoke detector, smoking near bedding or upholstery.   Dental health: Discussed importance of regular tooth brushing, flossing, and dental visits.    NEXT PREVENTATIVE PHYSICAL DUE IN 1 YEAR. Return in about 1 month (around 05/21/2015) for follow up weight.

## 2015-04-20 NOTE — Addendum Note (Signed)
Addended by: Valerie Roys on: 04/20/2015 08:52 AM   Modules accepted: Miquel Dunn

## 2015-04-20 NOTE — Patient Instructions (Addendum)
You are due for your mammogram after 05/22/15- your order is in, you can call and schedule whenever you'd like.  Preventive Care for Adults, Female A healthy lifestyle and preventive care can promote health and wellness. Preventive health guidelines for women include the following key practices.  A routine yearly physical is a good way to check with your health care provider about your health and preventive screening. It is a chance to share any concerns and updates on your health and to receive a thorough exam.  Visit your dentist for a routine exam and preventive care every 6 months. Brush your teeth twice a day and floss once a day. Good oral hygiene prevents tooth decay and gum disease.  The frequency of eye exams is based on your age, health, family medical history, use of contact lenses, and other factors. Follow your health care provider's recommendations for frequency of eye exams.  Eat a healthy diet. Foods like vegetables, fruits, whole grains, low-fat dairy products, and lean protein foods contain the nutrients you need without too many calories. Decrease your intake of foods high in solid fats, added sugars, and salt. Eat the right amount of calories for you.Get information about a proper diet from your health care provider, if necessary.  Regular physical exercise is one of the most important things you can do for your health. Most adults should get at least 150 minutes of moderate-intensity exercise (any activity that increases your heart rate and causes you to sweat) each week. In addition, most adults need muscle-strengthening exercises on 2 or more days a week.  Maintain a healthy weight. The body mass index (BMI) is a screening tool to identify possible weight problems. It provides an estimate of body fat based on height and weight. Your health care provider can find your BMI and can help you achieve or maintain a healthy weight.For adults 20 years and older:  A BMI below 18.5 is  considered underweight.  A BMI of 18.5 to 24.9 is normal.  A BMI of 25 to 29.9 is considered overweight.  A BMI of 30 and above is considered obese.  Maintain normal blood lipids and cholesterol levels by exercising and minimizing your intake of saturated fat. Eat a balanced diet with plenty of fruit and vegetables. Blood tests for lipids and cholesterol should begin at age 33 and be repeated every 5 years. If your lipid or cholesterol levels are high, you are over 50, or you are at high risk for heart disease, you may need your cholesterol levels checked more frequently.Ongoing high lipid and cholesterol levels should be treated with medicines if diet and exercise are not working.  If you smoke, find out from your health care provider how to quit. If you do not use tobacco, do not start.  Lung cancer screening is recommended for adults aged 54-80 years who are at high risk for developing lung cancer because of a history of smoking. A yearly low-dose CT scan of the lungs is recommended for people who have at least a 30-pack-year history of smoking and are a current smoker or have quit within the past 15 years. A pack year of smoking is smoking an average of 1 pack of cigarettes a day for 1 year (for example: 1 pack a day for 30 years or 2 packs a day for 15 years). Yearly screening should continue until the smoker has stopped smoking for at least 15 years. Yearly screening should be stopped for people who develop a health problem  that would prevent them from having lung cancer treatment.  If you are pregnant, do not drink alcohol. If you are breastfeeding, be very cautious about drinking alcohol. If you are not pregnant and choose to drink alcohol, do not have more than 1 drink per day. One drink is considered to be 12 ounces (355 mL) of beer, 5 ounces (148 mL) of wine, or 1.5 ounces (44 mL) of liquor.  Avoid use of street drugs. Do not share needles with anyone. Ask for help if you need support or  instructions about stopping the use of drugs.  High blood pressure causes heart disease and increases the risk of stroke. Your blood pressure should be checked at least every 1 to 2 years. Ongoing high blood pressure should be treated with medicines if weight loss and exercise do not work.  If you are 49-23 years old, ask your health care provider if you should take aspirin to prevent strokes.  Diabetes screening is done by taking a blood sample to check your blood glucose level after you have not eaten for a certain period of time (fasting). If you are not overweight and you do not have risk factors for diabetes, you should be screened once every 3 years starting at age 7. If you are overweight or obese and you are 24-60 years of age, you should be screened for diabetes every year as part of your cardiovascular risk assessment.  Breast cancer screening is essential preventive care for women. You should practice "breast self-awareness." This means understanding the normal appearance and feel of your breasts and may include breast self-examination. Any changes detected, no matter how small, should be reported to a health care provider. Women in their 63s and 30s should have a clinical breast exam (CBE) by a health care provider as part of a regular health exam every 1 to 3 years. After age 50, women should have a CBE every year. Starting at age 65, women should consider having a mammogram (breast X-ray test) every year. Women who have a family history of breast cancer should talk to their health care provider about genetic screening. Women at a high risk of breast cancer should talk to their health care providers about having an MRI and a mammogram every year.  Breast cancer gene (BRCA)-related cancer risk assessment is recommended for women who have family members with BRCA-related cancers. BRCA-related cancers include breast, ovarian, tubal, and peritoneal cancers. Having family members with these  cancers may be associated with an increased risk for harmful changes (mutations) in the breast cancer genes BRCA1 and BRCA2. Results of the assessment will determine the need for genetic counseling and BRCA1 and BRCA2 testing.  Your health care provider may recommend that you be screened regularly for cancer of the pelvic organs (ovaries, uterus, and vagina). This screening involves a pelvic examination, including checking for microscopic changes to the surface of your cervix (Pap test). You may be encouraged to have this screening done every 3 years, beginning at age 85.  For women ages 15-65, health care providers may recommend pelvic exams and Pap testing every 3 years, or they may recommend the Pap and pelvic exam, combined with testing for human papilloma virus (HPV), every 5 years. Some types of HPV increase your risk of cervical cancer. Testing for HPV may also be done on women of any age with unclear Pap test results.  Other health care providers may not recommend any screening for nonpregnant women who are considered low risk for  pelvic cancer and who do not have symptoms. Ask your health care provider if a screening pelvic exam is right for you.  If you have had past treatment for cervical cancer or a condition that could lead to cancer, you need Pap tests and screening for cancer for at least 20 years after your treatment. If Pap tests have been discontinued, your risk factors (such as having a new sexual partner) need to be reassessed to determine if screening should resume. Some women have medical problems that increase the chance of getting cervical cancer. In these cases, your health care provider may recommend more frequent screening and Pap tests.  Colorectal cancer can be detected and often prevented. Most routine colorectal cancer screening begins at the age of 83 years and continues through age 79 years. However, your health care provider may recommend screening at an earlier age if you  have risk factors for colon cancer. On a yearly basis, your health care provider may provide home test kits to check for hidden blood in the stool. Use of a small camera at the end of a tube, to directly examine the colon (sigmoidoscopy or colonoscopy), can detect the earliest forms of colorectal cancer. Talk to your health care provider about this at age 62, when routine screening begins. Direct exam of the colon should be repeated every 5-10 years through age 9 years, unless early forms of precancerous polyps or small growths are found.  People who are at an increased risk for hepatitis B should be screened for this virus. You are considered at high risk for hepatitis B if:  You were born in a country where hepatitis B occurs often. Talk with your health care provider about which countries are considered high risk.  Your parents were born in a high-risk country and you have not received a shot to protect against hepatitis B (hepatitis B vaccine).  You have HIV or AIDS.  You use needles to inject street drugs.  You live with, or have sex with, someone who has hepatitis B.  You get hemodialysis treatment.  You take certain medicines for conditions like cancer, organ transplantation, and autoimmune conditions.  Hepatitis C blood testing is recommended for all people born from [birth year] through 1964 and any individual with known risks for hepatitis C.  Practice safe sex. Use condoms and avoid high-risk sexual practices to reduce the spread of sexually transmitted infections (STIs). STIs include gonorrhea, chlamydia, syphilis, trichomonas, herpes, HPV, and human immunodeficiency virus (HIV). Herpes, HIV, and HPV are viral illnesses that have no cure. They can result in disability, cancer, and death.  You should be screened for sexually transmitted illnesses (STIs) including gonorrhea and chlamydia if:  You are sexually active and are younger than 24 years.  You are older than 24 years and your  health care provider tells you that you are at risk for this type of infection.  Your sexual activity has changed since you were last screened and you are at an increased risk for chlamydia or gonorrhea. Ask your health care provider if you are at risk.  If you are at risk of being infected with HIV, it is recommended that you take a prescription medicine daily to prevent HIV infection. This is called preexposure prophylaxis (PrEP). You are considered at risk if:  You are sexually active and do not regularly use condoms or know the HIV status of your partner(s).  You take drugs by injection.  You are sexually active with a partner who has  HIV.  Talk with your health care provider about whether you are at high risk of being infected with HIV. If you choose to begin PrEP, you should first be tested for HIV. You should then be tested every 3 months for as long as you are taking PrEP.  Osteoporosis is a disease in which the bones lose minerals and strength with aging. This can result in serious bone fractures or breaks. The risk of osteoporosis can be identified using a bone density scan. Women ages 59 years and over and women at risk for fractures or osteoporosis should discuss screening with their health care providers. Ask your health care provider whether you should take a calcium supplement or vitamin D to reduce the rate of osteoporosis.  Menopause can be associated with physical symptoms and risks. Hormone replacement therapy is available to decrease symptoms and risks. You should talk to your health care provider about whether hormone replacement therapy is right for you.  Use sunscreen. Apply sunscreen liberally and repeatedly throughout the day. You should seek shade when your shadow is shorter than you. Protect yourself by wearing long sleeves, pants, a wide-brimmed hat, and sunglasses year round, whenever you are outdoors.  Once a month, do a whole body skin exam, using a mirror to look  at the skin on your back. Tell your health care provider of new moles, moles that have irregular borders, moles that are larger than a pencil eraser, or moles that have changed in shape or color.  Stay current with required vaccines (immunizations).  Influenza vaccine. All adults should be immunized every year.  Tetanus, diphtheria, and acellular pertussis (Td, Tdap) vaccine. Pregnant women should receive 1 dose of Tdap vaccine during each pregnancy. The dose should be obtained regardless of the length of time since the last dose. Immunization is preferred during the 27th-36th week of gestation. An adult who has not previously received Tdap or who does not know her vaccine status should receive 1 dose of Tdap. This initial dose should be followed by tetanus and diphtheria toxoids (Td) booster doses every 10 years. Adults with an unknown or incomplete history of completing a 3-dose immunization series with Td-containing vaccines should begin or complete a primary immunization series including a Tdap dose. Adults should receive a Td booster every 10 years.  Varicella vaccine. An adult without evidence of immunity to varicella should receive 2 doses or a second dose if she has previously received 1 dose. Pregnant females who do not have evidence of immunity should receive the first dose after pregnancy. This first dose should be obtained before leaving the health care facility. The second dose should be obtained 4-8 weeks after the first dose.  Human papillomavirus (HPV) vaccine. Females aged 13-26 years who have not received the vaccine previously should obtain the 3-dose series. The vaccine is not recommended for use in pregnant females. However, pregnancy testing is not needed before receiving a dose. If a female is found to be pregnant after receiving a dose, no treatment is needed. In that case, the remaining doses should be delayed until after the pregnancy. Immunization is recommended for any person  with an immunocompromised condition through the age of 30 years if she did not get any or all doses earlier. During the 3-dose series, the second dose should be obtained 4-8 weeks after the first dose. The third dose should be obtained 24 weeks after the first dose and 16 weeks after the second dose.  Zoster vaccine. One dose is recommended  for adults aged 36 years or older unless certain conditions are present.  Measles, mumps, and rubella (MMR) vaccine. Adults born before [birth year] generally are considered immune to measles and mumps. Adults born in [birth year] or later should have 1 or more doses of MMR vaccine unless there is a contraindication to the vaccine or there is laboratory evidence of immunity to each of the three diseases. A routine second dose of MMR vaccine should be obtained at least 28 days after the first dose for students attending postsecondary schools, health care workers, or international travelers. People who received inactivated measles vaccine or an unknown type of measles vaccine during 1963-1967 should receive 2 doses of MMR vaccine. People who received inactivated mumps vaccine or an unknown type of mumps vaccine before 1979 and are at high risk for mumps infection should consider immunization with 2 doses of MMR vaccine. For females of childbearing age, rubella immunity should be determined. If there is no evidence of immunity, females who are not pregnant should be vaccinated. If there is no evidence of immunity, females who are pregnant should delay immunization until after pregnancy. Unvaccinated health care workers born before [birth year] who lack laboratory evidence of measles, mumps, or rubella immunity or laboratory confirmation of disease should consider measles and mumps immunization with 2 doses of MMR vaccine or rubella immunization with 1 dose of MMR vaccine.  Pneumococcal 13-valent conjugate (PCV13) vaccine. When indicated, a person who is uncertain of his immunization history and has  no record of immunization should receive the PCV13 vaccine. All adults 38 years of age and older should receive this vaccine. An adult aged 84 years or older who has certain medical conditions and has not been previously immunized should receive 1 dose of PCV13 vaccine. This PCV13 should be followed with a dose of pneumococcal polysaccharide (PPSV23) vaccine. Adults who are at high risk for pneumococcal disease should obtain the PPSV23 vaccine at least 8 weeks after the dose of PCV13 vaccine. Adults older than 53 years of age who have normal immune system function should obtain the PPSV23 vaccine dose at least 1 year after the dose of PCV13 vaccine.  Pneumococcal polysaccharide (PPSV23) vaccine. When PCV13 is also indicated, PCV13 should be obtained first. All adults aged 4 years and older should be immunized. An adult younger than age 47 years who has certain medical conditions should be immunized. Any person who resides in a nursing home or long-term care facility should be immunized. An adult smoker should be immunized. People with an immunocompromised condition and certain other conditions should receive both PCV13 and PPSV23 vaccines. People with human immunodeficiency virus (HIV) infection should be immunized as soon as possible after diagnosis. Immunization during chemotherapy or radiation therapy should be avoided. Routine use of PPSV23 vaccine is not recommended for American Indians, [place] Natives, or people younger than 65 years unless there are medical conditions that require PPSV23 vaccine. When indicated, people who have unknown immunization and have no record of immunization should receive PPSV23 vaccine. One-time revaccination 5 years after the first dose of PPSV23 is recommended for people aged 19-64 years who have chronic kidney failure, nephrotic syndrome, asplenia, or immunocompromised conditions. People who received 1-2 doses of PPSV23 before age 13 years should receive another dose of PPSV23  vaccine at age 11 years or later if at least 5 years have passed since the previous dose. Doses of PPSV23 are not needed for people immunized with PPSV23 at or after age 28 years.  Meningococcal vaccine. Adults with  asplenia or persistent complement component deficiencies should receive 2 doses of quadrivalent meningococcal conjugate (MenACWY-D) vaccine. The doses should be obtained at least 2 months apart. Microbiologists working with certain meningococcal bacteria, [place] recruits, people at risk during an outbreak, and people who travel to or live in countries with a high rate of meningitis should be immunized. A first-year college student up through age 2 years who is living in a residence hall should receive a dose if she did not receive a dose on or after her 16th birthday. Adults who have certain high-risk conditions should receive one or more doses of vaccine.  Hepatitis A vaccine. Adults who wish to be protected from this disease, have certain high-risk conditions, work with hepatitis A-infected animals, work in hepatitis A research labs, or travel to or work in countries with a high rate of hepatitis A should be immunized. Adults who were previously unvaccinated and who anticipate close contact with an international adoptee during the first 60 days after arrival in the [place] from a country with a high rate of hepatitis A should be immunized.  Hepatitis B vaccine. Adults who wish to be protected from this disease, have certain high-risk conditions, may be exposed to blood or other infectious body fluids, are household contacts or sex partners of hepatitis B positive people, are clients or workers in certain care facilities, or travel to or work in countries with a high rate of hepatitis B should be immunized.  Haemophilus influenzae type b (Hib) vaccine. A previously unvaccinated person with asplenia or sickle cell disease or having a scheduled splenectomy should receive 1 dose of Hib  vaccine. Regardless of previous immunization, a recipient of a hematopoietic stem cell transplant should receive a 3-dose series 6-12 months after her successful transplant. Hib vaccine is not recommended for adults with HIV infection. Preventive Services / Frequency Ages 77 to 26 years  Blood pressure check.** / Every 3-5 years.  Lipid and cholesterol check.** / Every 5 years beginning at age 54.  Clinical breast exam.** / Every 3 years for women in their 55s and 28s.  BRCA-related cancer risk assessment.** / For women who have family members with a BRCA-related cancer (breast, ovarian, tubal, or peritoneal cancers).  Pap test.** / Every 2 years from ages 32 through 36. Every 3 years starting at age 63 through age 79 or 76 with a history of 3 consecutive normal Pap tests.  HPV screening.** / Every 3 years from ages 21 through ages 20 to 12 with a history of 3 consecutive normal Pap tests.  Hepatitis C blood test.** / For any individual with known risks for hepatitis C.  Skin self-exam. / Monthly.  Influenza vaccine. / Every year.  Tetanus, diphtheria, and acellular pertussis (Tdap, Td) vaccine.** / Consult your health care provider. Pregnant women should receive 1 dose of Tdap vaccine during each pregnancy. 1 dose of Td every 10 years.  Varicella vaccine.** / Consult your health care provider. Pregnant females who do not have evidence of immunity should receive the first dose after pregnancy.  HPV vaccine. / 3 doses over 6 months, if 42 and younger. The vaccine is not recommended for use in pregnant females. However, pregnancy testing is not needed before receiving a dose.  Measles, mumps, rubella (MMR) vaccine.** / You need at least 1 dose of MMR if you were born in 1957 or later. You may also need a 2nd dose. For females of childbearing age, rubella immunity should be determined. If there is no  evidence of immunity, females who are not pregnant should be vaccinated. If there is no  evidence of immunity, females who are pregnant should delay immunization until after pregnancy.  Pneumococcal 13-valent conjugate (PCV13) vaccine.** / Consult your health care provider.  Pneumococcal polysaccharide (PPSV23) vaccine.** / 1 to 2 doses if you smoke cigarettes or if you have certain conditions.  Meningococcal vaccine.** / 1 dose if you are age 52 to 22 years and a Market researcher living in a residence hall, or have one of several medical conditions, you need to get vaccinated against meningococcal disease. You may also need additional booster doses.  Hepatitis A vaccine.** / Consult your health care provider.  Hepatitis B vaccine.** / Consult your health care provider.  Haemophilus influenzae type b (Hib) vaccine.** / Consult your health care provider. Ages 87 to 7 years  Blood pressure check.** / Every year.  Lipid and cholesterol check.** / Every 5 years beginning at age 38 years.  Lung cancer screening. / Every year if you are aged 76-80 years and have a 30-pack-year history of smoking and currently smoke or have quit within the past 15 years. Yearly screening is stopped once you have quit smoking for at least 15 years or develop a health problem that would prevent you from having lung cancer treatment.  Clinical breast exam.** / Every year after age 67 years.  BRCA-related cancer risk assessment.** / For women who have family members with a BRCA-related cancer (breast, ovarian, tubal, or peritoneal cancers).  Mammogram.** / Every year beginning at age 40 years and continuing for as long as you are in good health. Consult with your health care provider.  Pap test.** / Every 3 years starting at age 19 years through age 17 or 12 years with a history of 3 consecutive normal Pap tests.  HPV screening.** / Every 3 years from ages 37 years through ages 34 to 71 years with a history of 3 consecutive normal Pap tests.  Fecal occult blood test (FOBT) of stool. /  Every year beginning at age 46 years and continuing until age 87 years. You may not need to do this test if you get a colonoscopy every 10 years.  Flexible sigmoidoscopy or colonoscopy.** / Every 5 years for a flexible sigmoidoscopy or every 10 years for a colonoscopy beginning at age 57 years and continuing until age 22 years.  Hepatitis C blood test.** / For all people born from [birth year] through 1964 and any individual with known risks for hepatitis C.  Skin self-exam. / Monthly.  Influenza vaccine. / Every year.  Tetanus, diphtheria, and acellular pertussis (Tdap/Td) vaccine.** / Consult your health care provider. Pregnant women should receive 1 dose of Tdap vaccine during each pregnancy. 1 dose of Td every 10 years.  Varicella vaccine.** / Consult your health care provider. Pregnant females who do not have evidence of immunity should receive the first dose after pregnancy.  Zoster vaccine.** / 1 dose for adults aged 106 years or older.  Measles, mumps, rubella (MMR) vaccine.** / You need at least 1 dose of MMR if you were born in 1957 or later. You may also need a second dose. For females of childbearing age, rubella immunity should be determined. If there is no evidence of immunity, females who are not pregnant should be vaccinated. If there is no evidence of immunity, females who are pregnant should delay immunization until after pregnancy.  Pneumococcal 13-valent conjugate (PCV13) vaccine.** / Consult your health care provider.  Pneumococcal polysaccharide (  PPSV23) vaccine.** / 1 to 2 doses if you smoke cigarettes or if you have certain conditions.  Meningococcal vaccine.** / Consult your health care provider.  Hepatitis A vaccine.** / Consult your health care provider.  Hepatitis B vaccine.** / Consult your health care provider.  Haemophilus influenzae type b (Hib) vaccine.** / Consult your health care provider. Ages 36 years and over  Blood pressure check.** / Every year.  Lipid  and cholesterol check.** / Every 5 years beginning at age 36 years.  Lung cancer screening. / Every year if you are aged 52-80 years and have a 30-pack-year history of smoking and currently smoke or have quit within the past 15 years. Yearly screening is stopped once you have quit smoking for at least 15 years or develop a health problem that would prevent you from having lung cancer treatment.  Clinical breast exam.** / Every year after age 40 years.  BRCA-related cancer risk assessment.** / For women who have family members with a BRCA-related cancer (breast, ovarian, tubal, or peritoneal cancers).  Mammogram.** / Every year beginning at age 53 years and continuing for as long as you are in good health. Consult with your health care provider.  Pap test.** / Every 3 years starting at age 35 years through age 66 or 78 years with 3 consecutive normal Pap tests. Testing can be stopped between 65 and 70 years with 3 consecutive normal Pap tests and no abnormal Pap or HPV tests in the past 10 years.  HPV screening.** / Every 3 years from ages 81 years through ages 28 or 37 years with a history of 3 consecutive normal Pap tests. Testing can be stopped between 65 and 70 years with 3 consecutive normal Pap tests and no abnormal Pap or HPV tests in the past 10 years.  Fecal occult blood test (FOBT) of stool. / Every year beginning at age 51 years and continuing until age 49 years. You may not need to do this test if you get a colonoscopy every 10 years.  Flexible sigmoidoscopy or colonoscopy.** / Every 5 years for a flexible sigmoidoscopy or every 10 years for a colonoscopy beginning at age 20 years and continuing until age 22 years.  Hepatitis C blood test.** / For all people born from [birth year] through 1964 and any individual with known risks for hepatitis C.  Osteoporosis screening.** / A one-time screening for women ages 29 years and over and women at risk for fractures or osteoporosis.  Skin  self-exam. / Monthly.  Influenza vaccine. / Every year.  Tetanus, diphtheria, and acellular pertussis (Tdap/Td) vaccine.** / 1 dose of Td every 10 years.  Varicella vaccine.** / Consult your health care provider.  Zoster vaccine.** / 1 dose for adults aged 37 years or older.  Pneumococcal 13-valent conjugate (PCV13) vaccine.** / Consult your health care provider.  Pneumococcal polysaccharide (PPSV23) vaccine.** / 1 dose for all adults aged 47 years and older.  Meningococcal vaccine.** / Consult your health care provider.  Hepatitis A vaccine.** / Consult your health care provider.  Hepatitis B vaccine.** / Consult your health care provider.  Haemophilus influenzae type b (Hib) vaccine.** / Consult your health care provider. ** Family history and personal history of risk and conditions may change your health care provider's recommendations.   This information is not intended to replace advice given to you by your health care provider. Make sure you discuss any questions you have with your health care provider.   Document Released: 08/22/2001 Document Revised: 07/17/2014 Document  Reviewed: 11/21/2010 Elsevier Interactive Patient Education [employer].

## 2015-04-29 ENCOUNTER — Ambulatory Visit (INDEPENDENT_AMBULATORY_CARE_PROVIDER_SITE_OTHER): Payer: 59 | Admitting: Family Medicine

## 2015-04-29 ENCOUNTER — Telehealth: Payer: Self-pay

## 2015-04-29 ENCOUNTER — Encounter: Payer: Self-pay | Admitting: Family Medicine

## 2015-04-29 VITALS — BP 142/85 | HR 63 | Temp 97.9°F | Ht 64.3 in | Wt 197.0 lb

## 2015-04-29 DIAGNOSIS — R300 Dysuria: Secondary | ICD-10-CM | POA: Diagnosis not present

## 2015-04-29 LAB — UA/M W/RFLX CULTURE, ROUTINE
BILIRUBIN UA: NEGATIVE
GLUCOSE, UA: NEGATIVE
Ketones, UA: NEGATIVE
LEUKOCYTES UA: NEGATIVE
Nitrite, UA: NEGATIVE
PH UA: 7 (ref 5.0–7.5)
PROTEIN UA: NEGATIVE
RBC, UA: NEGATIVE
SPEC GRAV UA: 1.01 (ref 1.005–1.030)
Urobilinogen, Ur: 0.2 mg/dL (ref 0.2–1.0)

## 2015-04-29 MED ORDER — CIPROFLOXACIN HCL 500 MG PO TABS: 500.0000 mg | ORAL_TABLET | Freq: Two times a day (BID) | ORAL | Status: DC

## 2015-04-29 NOTE — Patient Instructions (Signed)
Interstitial Cystitis Interstitial cystitis is a condition that causes inflammation of the bladder. The bladder is a hollow organ in the lower part of your abdomen. It stores urine after the urine is made by your kidneys. With interstitial cystitis, you may have pain in the bladder area. You may also have a frequent and urgent need to urinate. The severity of interstitial cystitis can vary from person to person. You may have flare-ups of the condition, and then it may go away for a while. For many people who have this condition, it becomes a long-term problem. CAUSES The cause of this condition is not known. RISK FACTORS This condition is more likely to develop in women. SYMPTOMS Symptoms of interstitial cystitis vary, and they can change over time. Symptoms may include:  Discomfort or pain in the bladder area. This can range from mild to severe. The pain may change in intensity as the bladder fills with urine or as it empties.  Pelvic pain.  An urgent need to urinate.  Frequent urination.  Pain during sexual intercourse.  Pinpoint bleeding on the bladder wall. For women, the symptoms often get worse during menstruation. DIAGNOSIS This condition is diagnosed by evaluating your symptoms and ruling out other causes. A physical exam will be done. Various tests may be done to rule out other conditions. Common tests include:  Urine tests.  Cystoscopy. In this test, a tool that is like a very thin telescope is used to look into your bladder.  Biopsy. This involves taking a sample of tissue from the bladder wall to be examined under a microscope. TREATMENT There is no cure for interstitial cystitis, but treatment methods are available to control your symptoms. Work closely with your health care provider to find the treatments that will be most effective for you. Treatment options may include:  Medicines to relieve pain and to help reduce the number of times that you feel the need to  urinate.  Bladder training. This involves learning ways to control when you urinate, such as:  Urinating at scheduled times.  Training yourself to delay urination.  Doing exercises (Kegel exercises) to strengthen the muscles that control urine flow.  Lifestyle changes, such as changing your diet or taking steps to control stress.  Use of a device that provides electrical stimulation in order to reduce pain.  A procedure that stretches your bladder by filling it with air or fluid.  Surgery. This is rare. It is only done for extreme cases if other treatments do not help. HOME CARE INSTRUCTIONS  Take medicines only as directed by your health care provider.  Use bladder training techniques as directed.  Keep a bladder diary to find out which foods, liquids, or activities make your symptoms worse.  Use your bladder diary to schedule bathroom trips. If you are away from home, plan to be near a bathroom at each of your scheduled times.  Make sure you urinate just before you leave the house and just before you go to bed.  Do Kegel exercises as directed by your health care provider.  Do not drink alcohol.  Do not use any tobacco products, including cigarettes, chewing tobacco, or electronic cigarettes. If you need help quitting, ask your health care provider.  Make dietary changes as directed by your health care provider. You may need to avoid spicy foods and foods that contain a high amount of potassium.  Limit your drinking of beverages that stimulate urination. These include soda, coffee, and tea.  Keep all follow-up   visits as directed by your health care provider. This is important. SEEK MEDICAL CARE IF:  Your symptoms do not get better after treatment.  Your pain and discomfort are getting worse.  You have more frequent urges to urinate.  You have a fever. SEEK IMMEDIATE MEDICAL CARE IF:  You are not able to control your bladder at all.   This information is not  intended to replace advice given to you by your health care provider. Make sure you discuss any questions you have with your health care provider.   Document Released: 02/25/2004 Document Revised: 07/17/2014 Document Reviewed: 03/03/2014 Elsevier Interactive Patient Education 2016 Elsevier Inc.  

## 2015-04-29 NOTE — Telephone Encounter (Signed)
Patient called, she is having UTI symptoms. Patient will come in for an appt at 4:00pm today.

## 2015-04-29 NOTE — Progress Notes (Signed)
BP 142/85 mmHg  Pulse 63  Temp(Src) 97.9 F (36.6 C)  Ht 5' 4.3" (1.633 m)  Wt 197 lb (89.359 kg)  BMI 33.51 kg/m2  SpO2 99%   Subjective:    Patient ID: Carol Becker, female    DOB: Jan 06, 1961, 54 y.o.   MRN: [id number]  HPI: Carol Becker is a 54 y.o. female  Chief Complaint  Patient presents with  . urinary symptoms   URINARY SYMPTOMS Duration: this morning Dysuria: yes Urinary frequency: yes Urgency: yes Small volume voids: yes Symptom severity: moderate Urinary incontinence: no Foul odor: no Hematuria: no Abdominal pain: no Back pain: no Suprapubic pain/pressure: yes Flank pain: no Fever:  no Vomiting: no Relief with cranberry juice: no Relief with pyridium: no Status: stable Previous urinary tract infection: yes Recurrent urinary tract infection: no Sexual activity: Monogamous History of sexually transmitted disease: no Vaginal discharge: no Treatments attempted: antibiotics, pyridium, cranberry and increasing fluids   Relevant past medical, surgical, family and social history reviewed and updated as indicated. Interim medical history since our last visit reviewed. Allergies and medications reviewed and updated.  Review of Systems  Constitutional: Negative.   Respiratory: Negative.   Cardiovascular: Negative.   Gastrointestinal: Negative.   Genitourinary: Negative.   Psychiatric/Behavioral: Negative.     Per HPI unless specifically indicated above     Objective:    BP 142/85 mmHg  Pulse 63  Temp(Src) 97.9 F (36.6 C)  Ht 5' 4.3" (1.633 m)  Wt 197 lb (89.359 kg)  BMI 33.51 kg/m2  SpO2 99%  Wt Readings from Last 3 Encounters:  04/29/15 197 lb (89.359 kg)  04/20/15 197 lb (89.359 kg)  03/23/15 193 lb (87.544 kg)    Physical Exam  Constitutional: She is oriented to person, place, and time. She appears well-developed and well-nourished. No distress.  HENT:  Head: Normocephalic and atraumatic.  Right Ear: Hearing normal.  Left  Ear: Hearing normal.  Nose: Nose normal.  Eyes: Conjunctivae and lids are normal. Right eye exhibits no discharge. Left eye exhibits no discharge. No scleral icterus.  Cardiovascular: Normal rate, regular rhythm, normal heart sounds and intact distal pulses.  Exam reveals no gallop and no friction rub.   No murmur heard. Pulmonary/Chest: Effort normal and breath sounds normal. No respiratory distress. She has no wheezes. She has no rales. She exhibits no tenderness.  Abdominal: Soft. Bowel sounds are normal. She exhibits no distension and no mass. There is no tenderness. There is no rebound and no guarding.  Musculoskeletal: Normal range of motion.  Neurological: She is alert and oriented to person, place, and time.  Skin: Skin is warm, dry and intact. No rash noted. No erythema. No pallor.  Psychiatric: She has a normal mood and affect. Her speech is normal and behavior is normal. Judgment and thought content normal. Cognition and memory are normal.  Nursing note and vitals reviewed.   Results for orders placed or performed in visit on 03/23/15  Microscopic Examination  Result Value Ref Range   WBC, UA 0-5 0 -  5 /hpf   RBC, UA 0-2 0 -  2 /hpf   Epithelial Cells (non renal) 0-10 0 - 10 /hpf   Renal Epithel, UA None seen None seen /hpf   Casts Present (A) None seen /lpf   Cast Type Hyaline casts N/A   Mucus, UA Present Not Estab.   Bacteria, UA Few None seen/Few  TSH  Result Value Ref Range   TSH 3.080 0.450 - 4.500  uIU/mL  Microalbumin, Urine Waived  Result Value Ref Range   Microalb, Ur Waived 10 0 - 19 mg/L   Creatinine, Urine Waived 100 10 - 300 mg/dL   Microalb/Creat Ratio <30 <30 mg/g  Comprehensive metabolic panel  Result Value Ref Range   Glucose 144 (H) 65 - 99 mg/dL   BUN 11 6 - 24 mg/dL   Creatinine, Ser 0.94 0.57 - 1.00 mg/dL   GFR calc non Af Amer 69 >59 mL/min/1.73   GFR calc Af Amer 80 >59 mL/min/1.73   BUN/Creatinine Ratio 12 9 - 23   Sodium 138 134 - 144  mmol/L   Potassium 4.4 3.5 - 5.2 mmol/L   Chloride 99 97 - 108 mmol/L   CO2 24 18 - 29 mmol/L   Calcium 9.7 8.7 - 10.2 mg/dL   Total Protein 7.1 6.0 - 8.5 g/dL   Albumin 4.3 3.5 - 5.5 g/dL   Globulin, Total 2.8 1.5 - 4.5 g/dL   Albumin/Globulin Ratio 1.5 1.1 - 2.5   Bilirubin Total 0.3 0.0 - 1.2 mg/dL   Alkaline Phosphatase 79 39 - 117 IU/L   AST 21 0 - 40 IU/L   ALT 26 0 - 32 IU/L  Lipid Panel Piccolo, Waived  Result Value Ref Range   Cholesterol Piccolo, Waived 146 <200 mg/dL   HDL Chol Piccolo, Waived 45 (L) >59 mg/dL   Triglycerides Piccolo,Waived 127 <150 mg/dL   Chol/HDL Ratio Piccolo,Waive 3.2 mg/dL   LDL Chol Calc Piccolo Waived 75 <100 mg/dL   VLDL Chol Calc Piccolo,Waive 25 <30 mg/dL  CBC With Differential/Platelet  Result Value Ref Range   WBC 6.5 3.4 - 10.8 x10E3/uL   RBC 4.27 3.77 - 5.28 x10E6/uL   Hemoglobin 13.2 11.1 - 15.9 g/dL   Hematocrit 38.7 34.0 - 46.6 %   MCV 91 79 - 97 fL   MCH 30.9 26.6 - 33.0 pg   MCHC 34.1 31.5 - 35.7 g/dL   RDW 14.3 12.3 - 15.4 %   Platelets 250 150 - 379 x10E3/uL   Neutrophils 69 %   Lymphs 23 %   MID 8 %   Neutrophils Absolute 4.5 1.4 - 7.0 x10E3/uL   Lymphocytes Absolute 1.5 0.7 - 3.1 x10E3/uL   MID (Absolute) 0.5 0.1 - 1.6 X10E3/uL  UA/M w/rflx Culture, Routine  Result Value Ref Range   Urine Culture, Routine Final report    Urine Culture result 1 Comment   Bayer DCA Hb A1c Waived  Result Value Ref Range   Bayer DCA Hb A1c Waived 6.1 <7.0 %      Assessment & Plan:   Problem List Items Addressed This Visit    None    Visit Diagnoses    Dysuria    -  Primary    Normal UA today, but all the symptoms of UTI. Will treat as UTI. Possibly IC- will work on identifying triggers and monitor. Call if not better.     Relevant Orders    UA/M w/rflx Culture, Routine        Follow up plan: Return if symptoms worsen or fail to improve.

## 2015-05-16 ENCOUNTER — Other Ambulatory Visit: Payer: Self-pay | Admitting: Family Medicine

## 2015-05-27 ENCOUNTER — Other Ambulatory Visit: Payer: Self-pay | Admitting: Family Medicine

## 2015-05-28 NOTE — Telephone Encounter (Signed)
Did she go through 3 refills in 2 months?

## 2015-05-28 NOTE — Telephone Encounter (Signed)
She can come pick up new Rx

## 2015-05-28 NOTE — Telephone Encounter (Signed)
I called and spoke with pharmacy, patient picked up her last refill a couple of days ago.

## 2015-07-16 ENCOUNTER — Other Ambulatory Visit: Payer: Self-pay | Admitting: Family Medicine

## 2015-07-16 ENCOUNTER — Other Ambulatory Visit: Payer: 59

## 2015-07-16 DIAGNOSIS — R300 Dysuria: Secondary | ICD-10-CM

## 2015-07-16 DIAGNOSIS — N3001 Acute cystitis with hematuria: Secondary | ICD-10-CM

## 2015-07-16 MED ORDER — CIPROFLOXACIN HCL 500 MG PO TABS: 500.0000 mg | ORAL_TABLET | Freq: Two times a day (BID) | ORAL | Status: DC

## 2015-07-23 LAB — UA/M W/RFLX CULTURE, ROUTINE
BILIRUBIN UA: NEGATIVE
GLUCOSE, UA: NEGATIVE
KETONES UA: NEGATIVE
Nitrite, UA: POSITIVE — AB
SPEC GRAV UA: 1.015 (ref 1.005–1.030)
UUROB: 0.2 mg/dL (ref 0.2–1.0)
pH, UA: 6 (ref 5.0–7.5)

## 2015-07-23 LAB — URINE CULTURE, REFLEX

## 2015-07-23 LAB — MICROSCOPIC EXAMINATION
CAST TYPE: NEGATIVE
CRYSTALS: NEGATIVE
Casts: NEGATIVE /lpf

## 2015-09-14 ENCOUNTER — Other Ambulatory Visit: Payer: Self-pay | Admitting: Family Medicine

## 2015-09-14 NOTE — Telephone Encounter (Signed)
Apt MJ for PE

## 2015-09-16 ENCOUNTER — Encounter: Payer: Self-pay | Admitting: Family Medicine

## 2015-09-16 NOTE — Telephone Encounter (Signed)
Letter sent.

## 2015-10-08 ENCOUNTER — Other Ambulatory Visit: Payer: Self-pay | Admitting: Family Medicine

## 2015-10-08 ENCOUNTER — Ambulatory Visit (INDEPENDENT_AMBULATORY_CARE_PROVIDER_SITE_OTHER): Payer: 59 | Admitting: Family Medicine

## 2015-10-08 ENCOUNTER — Encounter: Payer: Self-pay | Admitting: Family Medicine

## 2015-10-08 VITALS — BP 130/82 | HR 93 | Temp 99.5°F | Ht 63.7 in | Wt 210.0 lb

## 2015-10-08 DIAGNOSIS — F3181 Bipolar II disorder: Secondary | ICD-10-CM | POA: Diagnosis not present

## 2015-10-08 DIAGNOSIS — E785 Hyperlipidemia, unspecified: Secondary | ICD-10-CM

## 2015-10-08 DIAGNOSIS — R635 Abnormal weight gain: Secondary | ICD-10-CM

## 2015-10-08 DIAGNOSIS — E039 Hypothyroidism, unspecified: Secondary | ICD-10-CM | POA: Diagnosis not present

## 2015-10-08 DIAGNOSIS — R7303 Prediabetes: Secondary | ICD-10-CM

## 2015-10-08 DIAGNOSIS — E669 Obesity, unspecified: Secondary | ICD-10-CM

## 2015-10-08 LAB — LIPID PANEL PICCOLO, WAIVED
CHOLESTEROL PICCOLO, WAIVED: 145 mg/dL (ref <200)
Chol/HDL Ratio Piccolo,Waive: 3 mg/dL
HDL Chol Piccolo, Waived: 48 mg/dL — ABNORMAL LOW (ref >59)
LDL Chol Calc Piccolo Waived: 57 mg/dL (ref <100)
Triglycerides Piccolo,Waived: 200 mg/dL — ABNORMAL HIGH (ref <150)
VLDL Chol Calc Piccolo,Waive: 40 mg/dL — ABNORMAL HIGH (ref <30)

## 2015-10-08 LAB — BAYER DCA HB A1C WAIVED: HB A1C (BAYER DCA - WAIVED): 5.8 % (ref <7.0)

## 2015-10-08 MED ORDER — QUETIAPINE FUMARATE 100 MG PO TABS: 150.0000 mg | ORAL_TABLET | Freq: Every day | ORAL | Status: DC

## 2015-10-08 MED ORDER — LORCASERIN HCL 10 MG PO TABS: 10.0000 mg | ORAL_TABLET | Freq: Two times a day (BID) | ORAL | Status: DC

## 2015-10-08 MED ORDER — TRAMADOL HCL 50 MG PO TABS: 50.0000 mg | ORAL_TABLET | Freq: Four times a day (QID) | ORAL | Status: DC | PRN

## 2015-10-08 MED ORDER — SIMVASTATIN 20 MG PO TABS: 20.0000 mg | ORAL_TABLET | Freq: Every day | ORAL | Status: DC

## 2015-10-08 MED ORDER — GABAPENTIN 600 MG PO TABS: 600.0000 mg | ORAL_TABLET | Freq: Two times a day (BID) | ORAL | Status: DC

## 2015-10-08 NOTE — Assessment & Plan Note (Signed)
Under good control. Continue to monitor. Refills given today. Continue to monitor.

## 2015-10-08 NOTE — Assessment & Plan Note (Signed)
Stable on current regimen. Continue current regimen and continue to monitor. May benefit from more weight neutral medication. Consider latuda. Continue to monitor.

## 2015-10-08 NOTE — Assessment & Plan Note (Signed)
Not under good control. Has gotten out of control. Couldn't afford saxenda. Will start belviq and see if it helps, monitor mood closely for hypomania. Not able to take contrave due to chronic pain. Continue to monitor.

## 2015-10-08 NOTE — Progress Notes (Signed)
BP 130/82 mmHg  Pulse 93  Temp(Src) 99.5 F (37.5 C)  Ht 5' 3.7" (1.618 m)  Wt 210 lb (95.255 kg)  BMI 36.39 kg/m2  SpO2 96%   Subjective:    Patient ID: Carol Becker, female    DOB: 08/20/1960, 55 y.o.   MRN: [id number]  HPI: Carol Becker is a 55 y.o. female  Chief Complaint  Patient presents with  . Fatigue  . Weight Gain   HYPERLIPIDEMIA Hyperlipidemia status: excellent compliance Satisfied with current treatment?  yes Side effects:  no Medication compliance: excellent compliance Supplements: none Aspirin:  no Chest pain:  no Coronary artery disease:  no  BIPOLAR Mood status: stable Satisfied with current treatment?: yes Symptom severity: mild  Duration of current treatment : chronic Side effects: no Medication compliance: excellent compliance Psychotherapy/counseling: no  Depressed mood: yes Anxious mood: no Anhedonia: no Significant weight loss or gain: yes Insomnia: no  Fatigue: yes Feelings of worthlessness or guilt: no Impaired concentration/indecisiveness: no Suicidal ideations: no Hopelessness: no Crying spells: no Depression screen PHQ 2/9 04/20/2015  Decreased Interest 1  Down, Depressed, Hopeless 1  PHQ - 2 Score 2   Impaired Fasting Glucose HbA1C: 5.8 Duration of elevated blood sugar: chronic Polydipsia: no Polyuria: no Weight change: yes Visual disturbance: no Glucose Monitoring: no Diabetic Education: Not Completed  HYPOTHYROIDISM Thyroid control status:worse Satisfied with current treatment? no Medication side effects: no Medication compliance: excellent compliance Recent dose adjustment:no Fatigue: yes Cold intolerance: no Heat intolerance: no Weight gain: yes Weight loss: no Constipation: no Diarrhea/loose stools: no Palpitations: yes Lower extremity edema: no Anxiety/depressed mood: yes   Relevant past medical, surgical, family and social history reviewed and updated as indicated. Interim medical history  since our last visit reviewed. Allergies and medications reviewed and updated.  Review of Systems  Constitutional: Positive for fatigue and unexpected weight change. Negative for fever, chills, diaphoresis, activity change and appetite change.  Respiratory: Negative.   Cardiovascular: Positive for palpitations. Negative for chest pain and leg swelling.  Musculoskeletal: Positive for back pain. Negative for myalgias, joint swelling, arthralgias, gait problem, neck pain and neck stiffness.  Psychiatric/Behavioral: Positive for sleep disturbance and dysphoric mood. Negative for suicidal ideas, hallucinations, behavioral problems, confusion, self-injury, decreased concentration and agitation. The patient is not nervous/anxious and is not hyperactive.     Per HPI unless specifically indicated above     Objective:    BP 130/82 mmHg  Pulse 93  Temp(Src) 99.5 F (37.5 C)  Ht 5' 3.7" (1.618 m)  Wt 210 lb (95.255 kg)  BMI 36.39 kg/m2  SpO2 96%  Wt Readings from Last 3 Encounters:  10/08/15 210 lb (95.255 kg)  04/29/15 197 lb (89.359 kg)  04/20/15 197 lb (89.359 kg)    Physical Exam  Constitutional: She is oriented to person, place, and time. She appears well-developed and well-nourished. No distress.  HENT:  Head: Normocephalic and atraumatic.  Right Ear: Hearing and external ear normal.  Left Ear: Hearing and external ear normal.  Nose: Nose normal.  Mouth/Throat: Oropharynx is clear and moist. No oropharyngeal exudate.  Eyes: Conjunctivae, EOM and lids are normal. Pupils are equal, round, and reactive to light. Right eye exhibits no discharge. Left eye exhibits no discharge. No scleral icterus.  Cardiovascular: Normal rate, regular rhythm, normal heart sounds and intact distal pulses.  Exam reveals no gallop and no friction rub.   No murmur heard. Pulmonary/Chest: Effort normal and breath sounds normal. No respiratory distress. She has no wheezes.  She has no rales. She exhibits no  tenderness.  Musculoskeletal: Normal range of motion.  Neurological: She is alert and oriented to person, place, and time.  Skin: Skin is warm, dry and intact. No rash noted. She is not diaphoretic. No erythema. No pallor.  Psychiatric: She has a normal mood and affect. Her speech is normal and behavior is normal. Judgment and thought content normal. Cognition and memory are normal.  Nursing note and vitals reviewed.   Results for orders placed or performed in visit on 10/08/15  Lipid Panel Piccolo, [employer]  Result Value Ref Range   Cholesterol Piccolo, Waived 145 <200 mg/dL   HDL Chol Piccolo, Waived 48 (L) >59 mg/dL   Triglycerides Piccolo,Waived 200 (H) <150 mg/dL   Chol/HDL Ratio Piccolo,Waive 3.0 mg/dL   LDL Chol Calc Piccolo Waived 57 <100 mg/dL   VLDL Chol Calc Piccolo,Waive 40 (H) <30 mg/dL  Bayer DCA Hb A1c Waived  Result Value Ref Range   Bayer DCA Hb A1c Waived 5.8 <7.0 %      Assessment & Plan:   Problem List Items Addressed This Visit      Endocrine   Thyroid activity decreased    Having symptoms again. Unclear if they are from her thyroid or from mood. Will check labs and await results. Will adjust as needed.       Relevant Orders   Thyroid Panel With TSH     Other   Hyperlipidemia    Under good control. Continue to monitor. Refills given today. Continue to monitor.       Relevant Medications   simvastatin (ZOCOR) 20 MG tablet   Bipolar 2 disorder (HCC)    Stable on current regimen. Continue current regimen and continue to monitor. May benefit from more weight neutral medication. Consider latuda. Continue to monitor.       Prediabetes    Improved. A1c down to 5.8. Continue diet and exercise. Continue to monitor. Recheck in 6 months.       Relevant Orders   Amb ref to [facility] Nutrition [facility]   Obesity - Primary    Not under good control. Has gotten out of control. Couldn't afford saxenda. Will start belviq and see if it helps, monitor mood closely  for hypomania. Not able to take contrave due to chronic pain. Continue to monitor.       Relevant Medications   Lorcaserin HCl 10 MG TABS   Other Relevant Orders   Amb ref to [facility]    Other Visit Diagnoses    Weight gain        Will check labs and will monitor.     Relevant Orders    Thyroid Panel With TSH    Amb ref to [facility]        Follow up plan: Return in about 4 weeks (around 11/05/2015) for weight check.

## 2015-10-08 NOTE — Assessment & Plan Note (Signed)
Improved. A1c down to 5.8. Continue diet and exercise. Continue to monitor. Recheck in 6 months.

## 2015-10-08 NOTE — Assessment & Plan Note (Signed)
Having symptoms again. Unclear if they are from her thyroid or from mood. Will check labs and await results. Will adjust as needed.

## 2015-10-09 LAB — COMPREHENSIVE METABOLIC PANEL
ALBUMIN: 4.4 g/dL (ref 3.5–5.5)
ALK PHOS: 90 IU/L (ref 39–117)
ALT: 38 IU/L — ABNORMAL HIGH (ref 0–32)
AST: 31 IU/L (ref 0–40)
Albumin/Globulin Ratio: 1.8 (ref 1.2–2.2)
BUN / CREAT RATIO: 20 (ref 9–23)
BUN: 15 mg/dL (ref 6–24)
Bilirubin Total: <0.2 mg/dL (ref 0.0–1.2)
CO2: 21 mmol/L (ref 18–29)
CREATININE: 0.74 mg/dL (ref 0.57–1.00)
Calcium: 9.6 mg/dL (ref 8.7–10.2)
Chloride: 99 mmol/L (ref 96–106)
GFR, EST AFRICAN AMERICAN: 107 mL/min/{1.73_m2} (ref >59)
GFR, EST NON AFRICAN AMERICAN: 93 mL/min/{1.73_m2} (ref >59)
GLOBULIN, TOTAL: 2.5 g/dL (ref 1.5–4.5)
GLUCOSE: 103 mg/dL — AB (ref 65–99)
Potassium: 4.4 mmol/L (ref 3.5–5.2)
Sodium: 140 mmol/L (ref 134–144)
TOTAL PROTEIN: 6.9 g/dL (ref 6.0–8.5)

## 2015-10-09 LAB — THYROID PANEL WITH TSH
Free Thyroxine Index: 1.9 (ref 1.2–4.9)
T3 UPTAKE RATIO: 29 % (ref 24–39)
T4, Total: 6.7 ug/dL (ref 4.5–12.0)
TSH: 4.22 u[IU]/mL (ref 0.450–4.500)

## 2015-10-11 ENCOUNTER — Telehealth: Payer: Self-pay | Admitting: Family Medicine

## 2015-10-11 DIAGNOSIS — E039 Hypothyroidism, unspecified: Secondary | ICD-10-CM

## 2015-10-11 MED ORDER — LEVOTHYROXINE SODIUM 50 MCG PO TABS: 50.0000 ug | ORAL_TABLET | Freq: Every day | ORAL | Status: DC

## 2015-10-11 NOTE — Telephone Encounter (Signed)
Called Carol Becker with the results. Will increase thyroid medicine. Will recheck 6 weeks. She is aware. Belviq very expensive. Will try coupon card if still really expensive, will call back.

## 2015-11-08 ENCOUNTER — Encounter: Payer: Self-pay | Admitting: Family Medicine

## 2015-11-08 ENCOUNTER — Ambulatory Visit (INDEPENDENT_AMBULATORY_CARE_PROVIDER_SITE_OTHER): Payer: 59 | Admitting: Family Medicine

## 2015-11-08 ENCOUNTER — Encounter (INDEPENDENT_AMBULATORY_CARE_PROVIDER_SITE_OTHER): Payer: Self-pay

## 2015-11-08 VITALS — BP 121/78 | HR 78 | Temp 98.7°F | Ht 64.5 in | Wt 207.0 lb

## 2015-11-08 DIAGNOSIS — E039 Hypothyroidism, unspecified: Secondary | ICD-10-CM | POA: Diagnosis not present

## 2015-11-08 DIAGNOSIS — E669 Obesity, unspecified: Secondary | ICD-10-CM

## 2015-11-08 NOTE — Assessment & Plan Note (Signed)
Tolerating belviq well with no side effects. Has lost 3lbs so far. Will check weight in 2 months if able to continue to afford it. If not, will send to weight loss clinic for eval. She is aware. Continue to monitor.

## 2015-11-08 NOTE — Progress Notes (Signed)
BP 121/78 mmHg  Pulse 78  Temp(Src) 98.7 F (37.1 C)  Ht 5' 4.5" (1.638 m)  Wt 207 lb (93.895 kg)  BMI 35.00 kg/m2  SpO2 97%   Subjective:    Patient ID: Carol Becker, female    DOB: 12-Feb-1961, 55 y.o.   MRN: [id number]  HPI: Carol Becker is a 55 y.o. female  Chief Complaint  Patient presents with  . Weight Check    down 3 pounds since last visit   WEIGHT GAIN- started on belviq last visit. Here to check on tolerance and how she's doing.  Duration: chronic Previous attempts at weight loss: yes Complications of obesity: yes Peak weight: 210 Weight loss to date: 3 lbs  Requesting obesity pharmacotherapy: yes Current weight loss supplements/medications: yes Previous weight loss supplements/meds: no   HYPOTHYROIDISM Thyroid control status:better Satisfied with current treatment? yes Medication side effects: no Medication compliance: excellent compliance Recent dose adjustment:yes Fatigue: yes Cold intolerance: no Heat intolerance: no Weight gain: no Weight loss: yes Constipation: no Diarrhea/loose stools: no Palpitations: no Lower extremity edema: no Anxiety/depressed mood: no   Relevant past medical, surgical, family and social history reviewed and updated as indicated. Interim medical history since our last visit reviewed. Allergies and medications reviewed and updated.  Review of Systems  Constitutional: Negative.   Respiratory: Negative.   Cardiovascular: Negative.   Psychiatric/Behavioral: Negative.     Per HPI unless specifically indicated above     Objective:    BP 121/78 mmHg  Pulse 78  Temp(Src) 98.7 F (37.1 C)  Ht 5' 4.5" (1.638 m)  Wt 207 lb (93.895 kg)  BMI 35.00 kg/m2  SpO2 97%  Wt Readings from Last 3 Encounters:  11/08/15 207 lb (93.895 kg)  10/08/15 210 lb (95.255 kg)  04/29/15 197 lb (89.359 kg)    Physical Exam  Constitutional: She is oriented to person, place, and time. She appears well-developed and  well-nourished. No distress.  HENT:  Head: Normocephalic and atraumatic.  Right Ear: Hearing normal.  Left Ear: Hearing normal.  Nose: Nose normal.  Eyes: Conjunctivae and lids are normal. Right eye exhibits no discharge. Left eye exhibits no discharge. No scleral icterus.  Cardiovascular: Normal rate, regular rhythm, normal heart sounds and intact distal pulses.  Exam reveals no gallop and no friction rub.   No murmur heard. Pulmonary/Chest: Effort normal and breath sounds normal. No respiratory distress. She has no wheezes. She has no rales. She exhibits no tenderness.  Musculoskeletal: Normal range of motion.  Neurological: She is alert and oriented to person, place, and time.  Skin: Skin is intact. No rash noted.  Psychiatric: She has a normal mood and affect. Her speech is normal and behavior is normal. Judgment and thought content normal. Cognition and memory are normal.  Nursing note and vitals reviewed.   Results for orders placed or performed in visit on 10/08/15  Lipid Panel Piccolo, [employer]  Result Value Ref Range   Cholesterol Piccolo, Waived 145 <200 mg/dL   HDL Chol Piccolo, Waived 48 (L) >59 mg/dL   Triglycerides Piccolo,Waived 200 (H) <150 mg/dL   Chol/HDL Ratio Piccolo,Waive 3.0 mg/dL   LDL Chol Calc Piccolo Waived 57 <100 mg/dL   VLDL Chol Calc Piccolo,Waive 40 (H) <30 mg/dL  Comprehensive metabolic panel  Result Value Ref Range   Glucose 103 (H) 65 - 99 mg/dL   BUN 15 6 - 24 mg/dL   Creatinine, Ser 0.74 0.57 - 1.00 mg/dL   GFR calc non Af Wyvonnia Lora  93 >59 mL/min/1.73   GFR calc Af Amer 107 >59 mL/min/1.73   BUN/Creatinine Ratio 20 9 - 23   Sodium 140 134 - 144 mmol/L   Potassium 4.4 3.5 - 5.2 mmol/L   Chloride 99 96 - 106 mmol/L   CO2 21 18 - 29 mmol/L   Calcium 9.6 8.7 - 10.2 mg/dL   Total Protein 6.9 6.0 - 8.5 g/dL   Albumin 4.4 3.5 - 5.5 g/dL   Globulin, Total 2.5 1.5 - 4.5 g/dL   Albumin/Globulin Ratio 1.8 1.2 - 2.2   Bilirubin Total <0.2 0.0 - 1.2 mg/dL    Alkaline Phosphatase 90 39 - 117 IU/L   AST 31 0 - 40 IU/L   ALT 38 (H) 0 - 32 IU/L  Bayer DCA Hb A1c Waived  Result Value Ref Range   Bayer DCA Hb A1c Waived 5.8 <7.0 %  Thyroid Panel With TSH  Result Value Ref Range   TSH 4.220 0.450 - 4.500 uIU/mL   T4, Total 6.7 4.5 - 12.0 ug/dL   T3 Uptake Ratio 29 24 - 39 %   Free Thyroxine Index 1.9 1.2 - 4.9      Assessment & Plan:   Problem List Items Addressed This Visit      Endocrine   Thyroid activity decreased    Due for recheck in her levels in 2 weeks. Will return and adjust as needed.         Other   Obesity - Primary    Tolerating belviq well with no side effects. Has lost 3lbs so far. Will check weight in 2 months if able to continue to afford it. If not, will send to weight loss clinic for eval. She is aware. Continue to monitor.           Follow up plan: Return in about 2 months (around 01/08/2016) for weight check .

## 2015-11-08 NOTE — Assessment & Plan Note (Signed)
Due for recheck in her levels in 2 weeks. Will return and adjust as needed.

## 2015-11-23 ENCOUNTER — Other Ambulatory Visit: Payer: 59

## 2015-11-23 DIAGNOSIS — E039 Hypothyroidism, unspecified: Secondary | ICD-10-CM

## 2015-11-24 ENCOUNTER — Telehealth: Payer: Self-pay | Admitting: Family Medicine

## 2015-11-24 LAB — TSH: TSH: 4.8 u[IU]/mL — AB (ref 0.450–4.500)

## 2015-11-24 MED ORDER — AMOXICILLIN-POT CLAVULANATE 875-125 MG PO TABS: 1.0000 | ORAL_TABLET | Freq: Two times a day (BID) | ORAL | Status: DC

## 2015-11-24 MED ORDER — LEVOTHYROXINE SODIUM 75 MCG PO TABS: 75.0000 ug | ORAL_TABLET | Freq: Every day | ORAL | Status: DC

## 2015-11-24 NOTE — Telephone Encounter (Signed)
Called and spoke to patient. Thyroid off. Will increase dose to 73mg and recheck in 6 weeks. If having palpitations, will decrease back down to 595m. Has been sick with hacking cough and sinusitis. Finishing z-pack but no better. Rx for augmentin sent to her pharmacy. Will call if not better or worse.

## 2016-01-10 ENCOUNTER — Ambulatory Visit: Payer: PRIVATE HEALTH INSURANCE | Admitting: Family Medicine

## 2016-01-14 ENCOUNTER — Encounter: Payer: Self-pay | Admitting: Family Medicine

## 2016-01-14 ENCOUNTER — Other Ambulatory Visit: Payer: Self-pay | Admitting: Family Medicine

## 2016-01-14 ENCOUNTER — Ambulatory Visit (INDEPENDENT_AMBULATORY_CARE_PROVIDER_SITE_OTHER): Payer: 59 | Admitting: Family Medicine

## 2016-01-14 VITALS — BP 138/78 | HR 83 | Temp 98.6°F | Wt 209.0 lb

## 2016-01-14 DIAGNOSIS — E669 Obesity, unspecified: Secondary | ICD-10-CM

## 2016-01-14 DIAGNOSIS — F458 Other somatoform disorders: Secondary | ICD-10-CM

## 2016-01-14 MED ORDER — FLUOXETINE HCL 20 MG PO TABS: 20.0000 mg | ORAL_TABLET | Freq: Every day | ORAL | Status: DC

## 2016-01-14 NOTE — Progress Notes (Signed)
BP 138/78 mmHg  Pulse 83  Temp(Src) 98.6 F (37 C)  Wt 209 lb (94.802 kg)  SpO2 97%   Subjective:    Patient ID: Carol Becker, female    DOB: 09/22/1960, 55 y.o.   MRN: [id number]  HPI: Carol Becker is a 55 y.o. female  Chief Complaint  Patient presents with  . Obesity    weight follow up, she had to stop the Belviq, it was too expensive.   WEIGHT GAIN Duration: chronic Previous attempts at weight loss: yes Complications of obesity: IFG, HLD Peak weight:  210lbs Weight loss goal: 170 Weight loss to date: 1lb Requesting obesity pharmacotherapy: yes Current weight loss supplements/medications: no Previous weight loss supplements/meds: yes- used belviq but it was too expensive. Can't use others due to cost/use of narcotics. Did well without manic symptoms on belviq. Lost weight on prozac, would like to try it again.   Having some pain in her muscle under her chin when she stretches. Talked to her dentist who thought it was due to her grinding her teeth. Wants to know if it has something to do with her thyroid.   Relevant past medical, surgical, family and social history reviewed and updated as indicated. Interim medical history since our last visit reviewed. Allergies and medications reviewed and updated.  Review of Systems  Constitutional: Negative.   Respiratory: Negative.   Cardiovascular: Negative.   Psychiatric/Behavioral: Negative.     Per HPI unless specifically indicated above     Objective:    BP 138/78 mmHg  Pulse 83  Temp(Src) 98.6 F (37 C)  Wt 209 lb (94.802 kg)  SpO2 97%  Wt Readings from Last 3 Encounters:  01/14/16 209 lb (94.802 kg)  11/08/15 207 lb (93.895 kg)  10/08/15 210 lb (95.255 kg)    Physical Exam  Constitutional: She is oriented to person, place, and time. She appears well-developed and well-nourished. No distress.  HENT:  Head: Normocephalic and atraumatic.  Right Ear: Hearing normal.  Left Ear: Hearing normal.  Nose:  Nose normal.  Eyes: Conjunctivae and lids are normal. Right eye exhibits no discharge. Left eye exhibits no discharge. No scleral icterus.  Cardiovascular: Normal rate, regular rhythm, normal heart sounds and intact distal pulses.  Exam reveals no gallop and no friction rub.   No murmur heard. Pulmonary/Chest: Effort normal and breath sounds normal. No respiratory distress. She has no wheezes. She has no rales. She exhibits no tenderness.  Musculoskeletal: Normal range of motion.  Neurological: She is alert and oriented to person, place, and time.  Skin: Skin is warm, dry and intact. No rash noted. No erythema. No pallor.  Psychiatric: She has a normal mood and affect. Her speech is normal and behavior is normal. Judgment and thought content normal. Cognition and memory are normal.  Nursing note and vitals reviewed.   Results for orders placed or performed in visit on 11/23/15  TSH  Result Value Ref Range   TSH 4.800 (H) 0.450 - 4.500 uIU/mL      Assessment & Plan:   Problem List Items Addressed This Visit      Other   Obesity - Primary    Will try her on prozac. Follow up 3 weeks. Signs of hypomania discussed and she will watch closely. Has been having some depression symptoms because of the weight. Continue to monitor closely.       Other Visit Diagnoses    Bruxism        Discussed stretches and  guard. Call if not getting better or getting worse.         Follow up plan: Return in about 3 weeks (around 02/04/2016) for follow up weight.

## 2016-01-14 NOTE — Assessment & Plan Note (Signed)
Will try her on prozac. Follow up 3 weeks. Signs of hypomania discussed and she will watch closely. Has been having some depression symptoms because of the weight. Continue to monitor closely.

## 2016-01-30 ENCOUNTER — Other Ambulatory Visit: Payer: Self-pay | Admitting: Family Medicine

## 2016-01-31 NOTE — Telephone Encounter (Signed)
Your patient.  Thanks 

## 2016-02-10 ENCOUNTER — Ambulatory Visit (INDEPENDENT_AMBULATORY_CARE_PROVIDER_SITE_OTHER): Payer: 59 | Admitting: Family Medicine

## 2016-02-10 ENCOUNTER — Encounter: Payer: Self-pay | Admitting: Family Medicine

## 2016-02-10 VITALS — BP 124/80 | HR 59 | Temp 98.5°F | Wt 210.0 lb

## 2016-02-10 DIAGNOSIS — E669 Obesity, unspecified: Secondary | ICD-10-CM | POA: Diagnosis not present

## 2016-02-10 DIAGNOSIS — E039 Hypothyroidism, unspecified: Secondary | ICD-10-CM

## 2016-02-10 DIAGNOSIS — Z23 Encounter for immunization: Secondary | ICD-10-CM

## 2016-02-10 MED ORDER — FLUOXETINE HCL 20 MG PO TABS: 20.0000 mg | ORAL_TABLET | Freq: Every day | ORAL | 1 refills | Status: DC

## 2016-02-10 NOTE — Progress Notes (Signed)
BP 124/80 (BP Location: Left Arm, Patient Position: Sitting, Cuff Size: Large)   Pulse (!) 59   Temp 98.5 F (36.9 C)   Wt 210 lb (95.3 kg)   SpO2 97%   BMI 35.49 kg/m    Subjective:    Patient ID: Carol Becker, female    DOB: [birth year], 55 y.o.   MRN: [id number]  HPI: Carol Becker is a 55 y.o. female  Chief Complaint  Patient presents with  . Obesity   Doing well on her medicine. No signs of mania. Has been losing weight. Feeling well.   HYPOTHYROIDISM Thyroid control status:better Satisfied with current treatment? yes Medication side effects: no Medication compliance: excellent compliance Recent dose adjustment:yes Fatigue: yes Cold intolerance: no Heat intolerance: no Weight gain: no Weight loss: no Constipation: yes Diarrhea/loose stools: no Palpitations: no Lower extremity edema: no Anxiety/depressed mood: no  Relevant past medical, surgical, family and social history reviewed and updated as indicated. Interim medical history since our last visit reviewed. Allergies and medications reviewed and updated.  Review of Systems  Constitutional: Negative.   Respiratory: Negative.   Cardiovascular: Negative.   Psychiatric/Behavioral: Negative.     Per HPI unless specifically indicated above     Objective:    BP 124/80 (BP Location: Left Arm, Patient Position: Sitting, Cuff Size: Large)   Pulse (!) 59   Temp 98.5 F (36.9 C)   Wt 210 lb (95.3 kg)   SpO2 97%   BMI 35.49 kg/m   Wt Readings from Last 3 Encounters:  02/10/16 210 lb (95.3 kg)  01/14/16 209 lb (94.8 kg)  11/08/15 207 lb (93.9 kg)    Physical Exam  Constitutional: She is oriented to person, place, and time. She appears well-developed and well-nourished. No distress.  HENT:  Head: Normocephalic and atraumatic.  Right Ear: Hearing normal.  Left Ear: Hearing normal.  Nose: Nose normal.  Eyes: Conjunctivae and lids are normal. Right eye exhibits no discharge. Left eye exhibits  no discharge. No scleral icterus.  Cardiovascular: Normal rate, regular rhythm, normal heart sounds and intact distal pulses.  Exam reveals no gallop and no friction rub.   No murmur heard. Pulmonary/Chest: Effort normal and breath sounds normal. No respiratory distress. She has no wheezes. She has no rales. She exhibits no tenderness.  Musculoskeletal: Normal range of motion.  Neurological: She is alert and oriented to person, place, and time.  Skin: Skin is warm, dry and intact. No rash noted. No erythema. No pallor.  Psychiatric: She has a normal mood and affect. Her speech is normal and behavior is normal. Judgment and thought content normal. Cognition and memory are normal.  Nursing note and vitals reviewed.   Results for orders placed or performed in visit on 11/23/15  TSH  Result Value Ref Range   TSH 4.800 (H) 0.450 - 4.500 uIU/mL      Assessment & Plan:   Problem List Items Addressed This Visit      Endocrine   Thyroid activity decreased    Rechecking levels today. Adjust as needed. Call with any concerns.       Relevant Orders   TSH     Other   Obesity - Primary    Doing well on her medicine and feeling well. Recheck October for Physical.       Other Visit Diagnoses    Immunization due       Tdap given today.   Relevant Orders   Tdap vaccine greater than or  equal to 7yo IM (Completed)       Follow up plan: Return After 10/11 , for physical.

## 2016-02-10 NOTE — Assessment & Plan Note (Signed)
Doing well on her medicine and feeling well. Recheck October for Physical.

## 2016-02-10 NOTE — Assessment & Plan Note (Signed)
Rechecking levels today. Adjust as needed. Call with any concerns.

## 2016-02-10 NOTE — Patient Instructions (Addendum)
Tdap Vaccine (Tetanus, Diphtheria and Pertussis): What You Need to Know 1. Why get vaccinated? Tetanus, diphtheria and pertussis are very serious diseases. Tdap vaccine can protect us from these diseases. And, Tdap vaccine given to pregnant women can protect newborn babies against pertussis. TETANUS (Lockjaw) is rare in the [place] today. It causes painful muscle tightening and stiffness, usually all over the body.  It can lead to tightening of muscles in the head and neck so you can't open your mouth, swallow, or sometimes even breathe. Tetanus kills about 1 out of 10 people who are infected even after receiving the best medical care. DIPHTHERIA is also rare in the [place] today. It can cause a thick coating to form in the back of the throat.  It can lead to breathing problems, heart failure, paralysis, and death. PERTUSSIS (Whooping Cough) causes severe coughing spells, which can cause difficulty breathing, vomiting and disturbed sleep.  It can also lead to weight loss, incontinence, and rib fractures. Up to 2 in 100 adolescents and 5 in 100 adults with pertussis are hospitalized or have complications, which could include pneumonia or death. These diseases are caused by bacteria. Diphtheria and pertussis are spread from person to person through secretions from coughing or sneezing. Tetanus enters the body through cuts, scratches, or wounds. Before vaccines, as many as 200,000 cases of diphtheria, 200,000 cases of pertussis, and hundreds of cases of tetanus, were reported in the [place] each year. Since vaccination began, reports of cases for tetanus and diphtheria have dropped by about 99% and for pertussis by about 80%. 2. Tdap vaccine Tdap vaccine can protect adolescents and adults from tetanus, diphtheria, and pertussis. One dose of Tdap is routinely given at age 11 or 12. People who did not get Tdap at that age should get it as soon as possible. Tdap is especially important  for healthcare professionals and anyone having close contact with a baby younger than 12 months. Pregnant women should get a dose of Tdap during every pregnancy, to protect the newborn from pertussis. Infants are most at risk for severe, life-threatening complications from pertussis. Another vaccine, called Td, protects against tetanus and diphtheria, but not pertussis. A Td booster should be given every 10 years. Tdap may be given as one of these boosters if you have never gotten Tdap before. Tdap may also be given after a severe cut or burn to prevent tetanus infection. Your doctor or the person giving you the vaccine can give you more information. Tdap may safely be given at the same time as other vaccines. 3. Some people should not get this vaccine  A person who has ever had a life-threatening allergic reaction after a previous dose of any diphtheria, tetanus or pertussis containing vaccine, OR has a severe allergy to any part of this vaccine, should not get Tdap vaccine. Tell the person giving the vaccine about any severe allergies.  Anyone who had coma or long repeated seizures within 7 days after a childhood dose of DTP or DTaP, or a previous dose of Tdap, should not get Tdap, unless a cause other than the vaccine was found. They can still get Td.  Talk to your doctor if you:  have seizures or another nervous system problem,  had severe pain or swelling after any vaccine containing diphtheria, tetanus or pertussis,  ever had a condition called Guillain-Barr Syndrome (GBS),  aren't feeling well on the day the shot is scheduled. 4. Risks With any medicine, including vaccines, there is   a chance of side effects. These are usually mild and go away on their own. Serious reactions are also possible but are rare. Most people who get Tdap vaccine do not have any problems with it. Mild problems following Tdap (Did not interfere with activities)  Pain where the shot was given (about 3 in 4  adolescents or 2 in 3 adults)  Redness or swelling where the shot was given (about 1 person in 5)  Mild fever of at least 100.4F (up to about 1 in 25 adolescents or 1 in 100 adults)  Headache (about 3 or 4 people in 10)  Tiredness (about 1 person in 3 or 4)  Nausea, vomiting, diarrhea, stomach ache (up to 1 in 4 adolescents or 1 in 10 adults)  Chills, sore joints (about 1 person in 10)  Body aches (about 1 person in 3 or 4)  Rash, swollen glands (uncommon) Moderate problems following Tdap (Interfered with activities, but did not require medical attention)  Pain where the shot was given (up to 1 in 5 or 6)  Redness or swelling where the shot was given (up to about 1 in 16 adolescents or 1 in 12 adults)  Fever over 102F (about 1 in 100 adolescents or 1 in 250 adults)  Headache (about 1 in 7 adolescents or 1 in 10 adults)  Nausea, vomiting, diarrhea, stomach ache (up to 1 or 3 people in 100)  Swelling of the entire arm where the shot was given (up to about 1 in 500). Severe problems following Tdap (Unable to perform usual activities; required medical attention)  Swelling, severe pain, bleeding and redness in the arm where the shot was given (rare). Problems that could happen after any vaccine:  People sometimes faint after a medical procedure, including vaccination. Sitting or lying down for about 15 minutes can help prevent fainting, and injuries caused by a fall. Tell your doctor if you feel dizzy, or have vision changes or ringing in the ears.  Some people get severe pain in the shoulder and have difficulty moving the arm where a shot was given. This happens very rarely.  Any medication can cause a severe allergic reaction. Such reactions from a vaccine are very rare, estimated at fewer than 1 in a million doses, and would happen within a few minutes to a few hours after the vaccination. As with any medicine, there is a very remote chance of a vaccine causing a serious  injury or death. The safety of vaccines is always being monitored. For more information, visit: www.cdc.gov/vaccinesafety/ 5. What if there is a serious problem? What should I look for?  Look for anything that concerns you, such as signs of a severe allergic reaction, very high fever, or unusual behavior.  Signs of a severe allergic reaction can include hives, swelling of the face and throat, difficulty breathing, a fast heartbeat, dizziness, and weakness. These would usually start a few minutes to a few hours after the vaccination. What should I do?  If you think it is a severe allergic reaction or other emergency that can't wait, call 9-1-1 or get the person to the nearest hospital. Otherwise, call your doctor.  Afterward, the reaction should be reported to the Vaccine Adverse Event Reporting System (VAERS). Your doctor might file this report, or you can do it yourself through the VAERS web site at www.vaers.hhs.gov, or by calling 1-800-822-7967. VAERS does not give medical advice.  6. The National Vaccine Injury Compensation Program The National Vaccine Injury Compensation Program (  VICP) is a federal program that was created to compensate people who may have been injured by certain vaccines. Persons who believe they may have been injured by a vaccine can learn about the program and about filing a claim by calling 1-800-338-2382 or visiting the VICP website at www.hrsa.gov/vaccinecompensation. There is a time limit to file a claim for compensation. 7. How can I learn more?  Ask your doctor. He or she can give you the vaccine package insert or suggest other sources of information.  Call your local or state health department.  Contact the Centers for Disease Control and Prevention (CDC):  Call 1-800-232-4636 (1-800-CDC-INFO) or  Visit CDC's website at www.cdc.gov/vaccines CDC Tdap Vaccine VIS (09/02/13)   This information is not intended to replace advice given to you by your health care  provider. Make sure you discuss any questions you have with your health care provider.   Document Released: 12/26/2011 Document Revised: 07/17/2014 Document Reviewed: 10/08/2013 Elsevier Interactive Patient Education 2016 [employer]  

## 2016-02-11 ENCOUNTER — Telehealth: Payer: Self-pay | Admitting: Family Medicine

## 2016-02-11 LAB — TSH: TSH: 1.54 u[IU]/mL (ref 0.450–4.500)

## 2016-02-11 MED ORDER — LEVOTHYROXINE SODIUM 75 MCG PO TABS: 75.0000 ug | ORAL_TABLET | Freq: Every day | ORAL | 3 refills | Status: DC

## 2016-02-11 NOTE — Telephone Encounter (Signed)
Please let Carol Becker know that her thyroid was normal and I sent a year supply of her medicine to the pharmacy. Thanks!

## 2016-02-11 NOTE — Telephone Encounter (Signed)
Patient notified

## 2016-02-29 ENCOUNTER — Other Ambulatory Visit: Payer: Self-pay | Admitting: Family Medicine

## 2016-03-28 ENCOUNTER — Other Ambulatory Visit: Payer: Self-pay | Admitting: Family Medicine

## 2016-06-13 ENCOUNTER — Ambulatory Visit (INDEPENDENT_AMBULATORY_CARE_PROVIDER_SITE_OTHER): Payer: 59 | Admitting: Family Medicine

## 2016-06-13 ENCOUNTER — Other Ambulatory Visit: Payer: Self-pay | Admitting: Family Medicine

## 2016-06-13 ENCOUNTER — Encounter: Payer: Self-pay | Admitting: Family Medicine

## 2016-06-13 VITALS — BP 121/76 | HR 72 | Temp 98.6°F | Ht 64.3 in | Wt 206.0 lb

## 2016-06-13 DIAGNOSIS — Z0001 Encounter for general adult medical examination with abnormal findings: Secondary | ICD-10-CM

## 2016-06-13 DIAGNOSIS — Z23 Encounter for immunization: Secondary | ICD-10-CM | POA: Diagnosis not present

## 2016-06-13 DIAGNOSIS — E6609 Other obesity due to excess calories: Secondary | ICD-10-CM | POA: Diagnosis not present

## 2016-06-13 DIAGNOSIS — E782 Mixed hyperlipidemia: Secondary | ICD-10-CM

## 2016-06-13 DIAGNOSIS — M25532 Pain in left wrist: Secondary | ICD-10-CM

## 2016-06-13 DIAGNOSIS — R7303 Prediabetes: Secondary | ICD-10-CM

## 2016-06-13 DIAGNOSIS — F3181 Bipolar II disorder: Secondary | ICD-10-CM | POA: Diagnosis not present

## 2016-06-13 DIAGNOSIS — E039 Hypothyroidism, unspecified: Secondary | ICD-10-CM

## 2016-06-13 DIAGNOSIS — Z6835 Body mass index (BMI) 35.0-35.9, adult: Secondary | ICD-10-CM | POA: Diagnosis not present

## 2016-06-13 DIAGNOSIS — N3000 Acute cystitis without hematuria: Secondary | ICD-10-CM | POA: Diagnosis not present

## 2016-06-13 DIAGNOSIS — IMO0001 Reserved for inherently not codable concepts without codable children: Secondary | ICD-10-CM

## 2016-06-13 DIAGNOSIS — Z0000 Encounter for general adult medical examination without abnormal findings: Secondary | ICD-10-CM

## 2016-06-13 DIAGNOSIS — Z79899 Other long term (current) drug therapy: Secondary | ICD-10-CM

## 2016-06-13 LAB — LIPID PANEL PICCOLO, WAIVED
CHOL/HDL RATIO PICCOLO,WAIVE: 3.5 mg/dL
CHOLESTEROL PICCOLO, WAIVED: 155 mg/dL (ref <200)
HDL Chol Piccolo, Waived: 45 mg/dL — ABNORMAL LOW (ref >59)
LDL CHOL CALC PICCOLO WAIVED: 80 mg/dL (ref <100)
TRIGLYCERIDES PICCOLO,WAIVED: 155 mg/dL — AB (ref <150)
VLDL Chol Calc Piccolo,Waive: 31 mg/dL — ABNORMAL HIGH (ref <30)

## 2016-06-13 LAB — UA/M W/RFLX CULTURE, ROUTINE
BILIRUBIN UA: NEGATIVE
Glucose, UA: NEGATIVE
Ketones, UA: NEGATIVE
Nitrite, UA: NEGATIVE
PH UA: 6 (ref 5.0–7.5)
Specific Gravity, UA: 1.025 (ref 1.005–1.030)
Urobilinogen, Ur: 0.2 mg/dL (ref 0.2–1.0)

## 2016-06-13 LAB — MICROSCOPIC EXAMINATION

## 2016-06-13 LAB — BAYER DCA HB A1C WAIVED: HB A1C: 6.2 % (ref <7.0)

## 2016-06-13 MED ORDER — HYDROCODONE-ACETAMINOPHEN 10-325 MG PO TABS: ORAL_TABLET | ORAL | 0 refills | Status: DC

## 2016-06-13 MED ORDER — FLUOXETINE HCL 20 MG PO TABS: 20.0000 mg | ORAL_TABLET | Freq: Every day | ORAL | 1 refills | Status: DC

## 2016-06-13 MED ORDER — CIPROFLOXACIN HCL 500 MG PO TABS: 500.0000 mg | ORAL_TABLET | Freq: Two times a day (BID) | ORAL | 0 refills | Status: DC

## 2016-06-13 MED ORDER — LEVOTHYROXINE SODIUM 75 MCG PO TABS: 75.0000 ug | ORAL_TABLET | Freq: Every day | ORAL | 3 refills | Status: DC

## 2016-06-13 MED ORDER — DICLOFENAC SODIUM 1 % TD GEL: 4.0000 g | Freq: Four times a day (QID) | TRANSDERMAL | 3 refills | Status: DC

## 2016-06-13 MED ORDER — GABAPENTIN 600 MG PO TABS
ORAL_TABLET | ORAL | 1 refills | Status: DC
Start: 2016-06-13 — End: 2016-12-19

## 2016-06-13 MED ORDER — QUETIAPINE FUMARATE 100 MG PO TABS: 150.0000 mg | ORAL_TABLET | Freq: Every day | ORAL | 1 refills | Status: DC

## 2016-06-13 MED ORDER — SIMVASTATIN 20 MG PO TABS: 20.0000 mg | ORAL_TABLET | Freq: Every day | ORAL | 1 refills | Status: DC

## 2016-06-13 NOTE — Assessment & Plan Note (Signed)
Doing great on vegetarian diet. If continues to be low next visit, will try trial off statin. For now, continue current regimen.

## 2016-06-13 NOTE — Assessment & Plan Note (Signed)
For hydrocodone for previous neck surgery. 3 month supply given 06/13/16 as she takes 1 a day

## 2016-06-13 NOTE — Assessment & Plan Note (Signed)
A1c 6.3. Continue to work on diet and exercise. Call with any concerns.

## 2016-06-13 NOTE — Assessment & Plan Note (Signed)
Rechecking levels today. Continue current regimen. Call with any concerns.

## 2016-06-13 NOTE — Assessment & Plan Note (Signed)
Stable. No concerns. Continue current regimen. Call with any concerns.

## 2016-06-13 NOTE — Progress Notes (Signed)
BP 121/76 (BP Location: Left Arm, Patient Position: Sitting, Cuff Size: Large)   Pulse 72   Temp 98.6 F (37 C)   Ht 5' 4.3" (1.633 m)   Wt 206 lb (93.4 kg)   SpO2 97%   BMI 35.03 kg/m    Subjective:    Patient ID: Carol Becker, female    DOB: [birth year], 55 y.o.   MRN: [id number]  HPI: Carol Becker is a 55 y.o. female presenting on 06/13/2016 for comprehensive medical examination. Current medical complaints include:  HAND PAIN Duration: about a month Involved hand: left Mechanism of injury: unknown Location: dorsal over thumb Onset: sudden Severity: moderate  Quality: aching Frequency: constant Radiation: no Aggravating factors: nothing Alleviating factors: nothing Treatments attempted: wrapping it Relief with NSAIDs?: No NSAIDs Taken Weakness: no Numbness: no Redness: no Swelling:no Bruising: no Fevers: no  HYPOTHYROIDISM Thyroid control status:controlled Satisfied with current treatment? yes Medication side effects: no Medication compliance: excellent compliance Etiology of hypothyroidism:  Recent dose adjustment:no Fatigue: yes Cold intolerance: no Heat intolerance: no Weight gain: no Weight loss: no Constipation: yes Diarrhea/loose stools: no Palpitations: yes Lower extremity edema: no Anxiety/depressed mood: no  HYPERLIPIDEMIA Hyperlipidemia status: Under good control- Now a vegetarian Satisfied with current treatment?  yes Side effects:  No Supplements: none Aspirin:  no Chest pain:  no Coronary artery disease:  no  She currently lives with: husband Menopausal Symptoms: no  Depression Screen done today and results listed below:  Depression screen [facility] 2/9 06/13/2016 04/20/2015  Decreased Interest 0 1  Down, Depressed, Hopeless 0 1  PHQ - 2 Score 0 2    Past Medical History:  Past Medical History:  Diagnosis Date  . Bipolar 1 disorder ([place])   . Cervical spinal stenosis   . GERD (gastroesophageal reflux disease)   .  Hyperlipidemia   . Osteopenia   . Sleep apnea   . Thyroid disease     Surgical History:  Past Surgical History:  Procedure Laterality Date  . ANTERIOR / POSTERIOR COMBINED FUSION CERVICAL SPINE    . KNEE ARTHROSCOPY WITH EXCISION BAKER'S CYST    . TUBAL LIGATION      Medications:  Current Outpatient Prescriptions on File Prior to Visit  Medication Sig  . cyclobenzaprine (FLEXERIL) 10 MG tablet Take 10 mg by mouth 3 (three) times daily as needed. for muscle spams  . levothyroxine (SYNTHROID, LEVOTHROID) 75 MCG tablet TAKE 1 TABLET (75 MCG TOTAL) BY MOUTH DAILY BEFORE BREAKFAST.  Marland Kitchen traMADol (ULTRAM) 50 MG tablet Take 1-2 tablets (50-100 mg total) by mouth every 6 (six) hours as needed.   No current facility-administered medications on file prior to visit.     Allergies:  Allergies  Allergen Reactions  . Codeine Phosphate [Codeine] Other (See Comments)    Jittery  . Crestor [Rosuvastatin Calcium] Other (See Comments)    Indigestion  . Sulfa Antibiotics Nausea Only  . Zithromax [Azithromycin] Other (See Comments)    Not effective    Social History:  Social History   Social History  . Marital status: Married    Spouse name: N/A  . Number of children: N/A  . Years of education: N/A   Occupational History  . Not on file.   Social History Main Topics  . Smoking status: Former Smoker    Quit date: 04/06/2011  . Smokeless tobacco: Never Used  . Alcohol use Yes     Comment: On Occasion  . Drug use: No  . Sexual activity: Yes  Birth control/ protection: Post-menopausal   Other Topics Concern  . Not on file   Social History Narrative  . No narrative on file   History  Smoking Status  . Former Smoker  . Quit date: 04/06/2011  Smokeless Tobacco  . Never Used   History  Alcohol Use  . Yes    Comment: On Occasion    Family History:  Family History  Problem Relation Age of Onset  . Heart disease Father     CAD  . Heart disease Sister 81    CAD  .  Cancer Maternal Grandmother     breast  . Cancer Paternal Grandmother     breast  . Alzheimer's disease Maternal Grandfather     Past medical history, surgical history, medications, allergies, family history and social history reviewed with patient today and changes made to appropriate areas of the chart.   Review of Systems  Constitutional: Negative.   HENT: Negative.   Eyes: Negative.   Respiratory: Negative.   Cardiovascular: Positive for palpitations. Negative for chest pain, orthopnea, claudication, leg swelling and PND.  Gastrointestinal: Positive for constipation and heartburn (Relieved with OTC medicine). Negative for abdominal pain, blood in stool, diarrhea, melena, nausea and vomiting.  Genitourinary: Positive for dysuria. Negative for flank pain, frequency, hematuria and urgency.  Musculoskeletal: Positive for joint pain, myalgias and neck pain. Negative for back pain and falls.  Skin: Negative.   Neurological: Negative.   Endo/Heme/Allergies: Negative.   Psychiatric/Behavioral: Negative.    All other ROS negative except what is listed above and in the HPI.      Objective:    BP 121/76 (BP Location: Left Arm, Patient Position: Sitting, Cuff Size: Large)   Pulse 72   Temp 98.6 F (37 C)   Ht 5' 4.3" (1.633 m)   Wt 206 lb (93.4 kg)   SpO2 97%   BMI 35.03 kg/m   Wt Readings from Last 3 Encounters:  06/13/16 206 lb (93.4 kg)  02/10/16 210 lb (95.3 kg)  01/14/16 209 lb (94.8 kg)    Physical Exam  Constitutional: She is oriented to person, place, and time. She appears well-developed and well-nourished. No distress.  HENT:  Head: Normocephalic and atraumatic.  Right Ear: Hearing, tympanic membrane, external ear and ear canal normal.  Left Ear: Hearing, tympanic membrane, external ear and ear canal normal.  Nose: Nose normal.  Mouth/Throat: Uvula is midline, oropharynx is clear and moist and mucous membranes are normal. No oropharyngeal exudate.  Eyes:  Conjunctivae, EOM and lids are normal. Pupils are equal, round, and reactive to light. Right eye exhibits no discharge. Left eye exhibits no discharge. No scleral icterus.  Neck: Normal range of motion. Neck supple. No JVD present. No tracheal deviation present. No thyromegaly present.  Cardiovascular: Normal rate, regular rhythm, normal heart sounds and intact distal pulses.  Exam reveals no gallop and no friction rub.   No murmur heard. Pulmonary/Chest: Effort normal and breath sounds normal. No stridor. No respiratory distress. She has no wheezes. She has no rales. She exhibits no tenderness. Right breast exhibits no inverted nipple, no mass, no nipple discharge, no skin change and no tenderness. Left breast exhibits no inverted nipple, no mass, no nipple discharge, no skin change and no tenderness. Breasts are symmetrical.  Abdominal: Soft. Bowel sounds are normal. She exhibits no distension and no mass. There is no tenderness. There is no rebound and no guarding.  Genitourinary:  Genitourinary Comments: GYN exam deferred with shared decision making  Musculoskeletal: Normal range of motion. She exhibits no edema, tenderness or deformity.  Lymphadenopathy:    She has no cervical adenopathy.  Neurological: She is alert and oriented to person, place, and time. She has normal reflexes. She displays normal reflexes. No cranial nerve deficit. She exhibits normal muscle tone. Coordination normal.  Skin: Skin is warm, dry and intact. No rash noted. She is not diaphoretic. No erythema. No pallor.  Psychiatric: She has a normal mood and affect. Her speech is normal and behavior is normal. Judgment and thought content normal. Cognition and memory are normal.  Nursing note and vitals reviewed.   Results for orders placed or performed in visit on 02/10/16  TSH  Result Value Ref Range   TSH 1.540 0.450 - 4.500 uIU/mL      Assessment & Plan:   Problem List Items Addressed This Visit      Endocrine    Thyroid activity decreased    Rechecking levels today. Continue current regimen. Call with any concerns.       Relevant Medications   levothyroxine (SYNTHROID, LEVOTHROID) 75 MCG tablet     Other   Hyperlipidemia    Doing great on vegetarian diet. If continues to be low next visit, will try trial off statin. For now, continue current regimen.      Relevant Medications   simvastatin (ZOCOR) 20 MG tablet   Bipolar 2 disorder ([facility])    Stable. No concerns. Continue current regimen. Call with any concerns.       Prediabetes    A1c 6.3. Continue to work on diet and exercise. Call with any concerns.       Obesity    Congratulated patient on 4lb weight loss. Continue current regimen. Continue to work on diet and exercise.       Controlled substance agreement signed    For hydrocodone for previous neck surgery. 3 month supply given 06/13/16 as she takes 1 a day       Other Visit Diagnoses    Routine general medical examination at a health care facility    -  Primary   Up to date on vaccines. Screening labs checked today. To get mammogram. Colonoscopy and pap up to date. Continue diet and exercise.    Immunization due       Flu shot given today.   Relevant Orders   Flu Vaccine QUAD 36+ mos PF IM (Fluarix & Fluzone Quad PF) (Completed)   Acute cystitis without hematuria       Will start her on cipro. Call with any concerns.    Left wrist pain       Will start her on diflucan as she can't take oral NSAIDs due to GI upset. Call if not getting better.       Follow up plan: Return in about 6 months (around 12/12/2016) for Follow up cholesterol and mood.   LABORATORY TESTING:  - Pap smear: up to date  IMMUNIZATIONS:   - Tdap: Tetanus vaccination status reviewed: last tetanus booster within 10 years. - Influenza: Administered today - Pneumovax: Not applicable - Prevnar: Not applicable - Zostavax vaccine: Will check with insurance  SCREENING: -Mammogram: Ordered- just needs to  go  - Colonoscopy: Up to date   PATIENT COUNSELING:   Advised to take 1 mg of folate supplement per day if capable of pregnancy.   Sexuality: Discussed sexually transmitted diseases, partner selection, use of condoms, avoidance of unintended pregnancy  and contraceptive alternatives.   Advised to avoid  cigarette smoking.  I discussed with the patient that most people either abstain from alcohol or drink within safe limits (<=14/week and <=4 drinks/occasion for males, <=7/weeks and <= 3 drinks/occasion for females) and that the risk for alcohol disorders and other health effects rises proportionally with the number of drinks per week and how often a drinker exceeds daily limits.  Discussed cessation/primary prevention of drug use and availability of treatment for abuse.   Diet: Encouraged to adjust caloric intake to maintain  or achieve ideal body weight, to reduce intake of dietary saturated fat and total fat, to limit sodium intake by avoiding high sodium foods and not adding table salt, and to maintain adequate dietary potassium and calcium preferably from fresh fruits, vegetables, and low-fat dairy products.    stressed the importance of regular exercise  Injury prevention: Discussed safety belts, safety helmets, smoke detector, smoking near bedding or upholstery.   Dental health: Discussed importance of regular tooth brushing, flossing, and dental visits.    NEXT PREVENTATIVE PHYSICAL DUE IN 1 YEAR. Return in about 6 months (around 12/12/2016) for Follow up cholesterol and mood.

## 2016-06-13 NOTE — Assessment & Plan Note (Signed)
Congratulated patient on 4lb weight loss. Continue current regimen. Continue to work on diet and exercise.

## 2016-06-13 NOTE — Patient Instructions (Addendum)
Health Maintenance, Female Introduction Adopting a healthy lifestyle and getting preventive care can go a long way to promote health and wellness. Talk with your health care provider about what schedule of regular examinations is right for you. This is a good chance for you to check in with your provider about disease prevention and staying healthy. In between checkups, there are plenty of things you can do on your own. Experts have done a lot of research about which lifestyle changes and preventive measures are most likely to keep you healthy. Ask your health care provider for more information. Weight and diet Eat a healthy diet  Be sure to include plenty of vegetables, fruits, low-fat dairy products, and lean protein.  Do not eat a lot of foods high in solid fats, added sugars, or salt.  Get regular exercise. This is one of the most important things you can do for your health.  Most adults should exercise for at least 150 minutes each week. The exercise should increase your heart rate and make you sweat (moderate-intensity exercise).  Most adults should also do strengthening exercises at least twice a week. This is in addition to the moderate-intensity exercise. Maintain a healthy weight  Body mass index (BMI) is a measurement that can be used to identify possible weight problems. It estimates body fat based on height and weight. Your health care provider can help determine your BMI and help you achieve or maintain a healthy weight.  For females 63 years of age and older:  A BMI below 18.5 is considered underweight.  A BMI of 18.5 to 24.9 is normal.  A BMI of 25 to 29.9 is considered overweight.  A BMI of 30 and above is considered obese. Watch levels of cholesterol and blood lipids  You should start having your blood tested for lipids and cholesterol at 54 years of age, then have this test every 5 years.  You may need to have your cholesterol levels checked more often if:  Your  lipid or cholesterol levels are high.  You are older than 54 years of age.  You are at high risk for heart disease. Cancer screening Lung Cancer  Lung cancer screening is recommended for adults 54-22 years old who are at high risk for lung cancer because of a history of smoking.  A yearly low-dose CT scan of the lungs is recommended for people who:  Currently smoke.  Have quit within the past 15 years.  Have at least a 30-pack-year history of smoking. A pack year is smoking an average of one pack of cigarettes a day for 1 year.  Yearly screening should continue until it has been 15 years since you quit.  Yearly screening should stop if you develop a health problem that would prevent you from having lung cancer treatment. Breast Cancer  Practice breast self-awareness. This means understanding how your breasts normally appear and feel.  It also means doing regular breast self-exams. Let your health care provider know about any changes, no matter how small.  If you are in your 20s or 30s, you should have a clinical breast exam (CBE) by a health care provider every 1-3 years as part of a regular health exam.  If you are 35 or older, have a CBE every year. Also consider having a breast X-ray (mammogram) every year.  If you have a family history of breast cancer, talk to your health care provider about genetic screening.  If you are at high risk for breast cancer,  talk to your health care provider about having an MRI and a mammogram every year.  Breast cancer gene (BRCA) assessment is recommended for women who have family members with BRCA-related cancers. BRCA-related cancers include:  Breast.  Ovarian.  Tubal.  Peritoneal cancers.  Results of the assessment will determine the need for genetic counseling and BRCA1 and BRCA2 testing. Cervical Cancer  Your health care provider may recommend that you be screened regularly for cancer of the pelvic organs (ovaries, uterus, and  vagina). This screening involves a pelvic examination, including checking for microscopic changes to the surface of your cervix (Pap test). You may be encouraged to have this screening done every 3 years, beginning at age 54.  For women ages 30-65, health care providers may recommend pelvic exams and Pap testing every 3 years, or they may recommend the Pap and pelvic exam, combined with testing for human papilloma virus (HPV), every 5 years. Some types of HPV increase your risk of cervical cancer. Testing for HPV may also be done on women of any age with unclear Pap test results.  Other health care providers may not recommend any screening for nonpregnant women who are considered low risk for pelvic cancer and who do not have symptoms. Ask your health care provider if a screening pelvic exam is right for you.  If you have had past treatment for cervical cancer or a condition that could lead to cancer, you need Pap tests and screening for cancer for at least 20 years after your treatment. If Pap tests have been discontinued, your risk factors (such as having a new sexual partner) need to be reassessed to determine if screening should resume. Some women have medical problems that increase the chance of getting cervical cancer. In these cases, your health care provider may recommend more frequent screening and Pap tests. Colorectal Cancer  This type of cancer can be detected and often prevented.  Routine colorectal cancer screening usually begins at 54 years of age and continues through 54 years of age.  Your health care provider may recommend screening at an earlier age if you have risk factors for colon cancer.  Your health care provider may also recommend using home test kits to check for hidden blood in the stool.  A small camera at the end of a tube can be used to examine your colon directly (sigmoidoscopy or colonoscopy). This is done to check for the earliest forms of colorectal  cancer.  Routine screening usually begins at age 50.  Direct examination of the colon should be repeated every 5-10 years through 54 years of age. However, you may need to be screened more often if early forms of precancerous polyps or small growths are found. Skin Cancer  Check your skin from head to toe regularly.  Tell your health care provider about any new moles or changes in moles, especially if there is a change in a mole's shape or color.  Also tell your health care provider if you have a mole that is larger than the size of a pencil eraser.  Always use sunscreen. Apply sunscreen liberally and repeatedly throughout the day.  Protect yourself by wearing long sleeves, pants, a wide-brimmed hat, and sunglasses whenever you are outside. Heart disease, diabetes, and high blood pressure  High blood pressure causes heart disease and increases the risk of stroke. High blood pressure is more likely to develop in:  People who have blood pressure in the high end of the normal range (130-139/85-89 mm Hg).    People who are overweight or obese.  People who are African American.  If you are 18-39 years of age, have your blood pressure checked every 3-5 years. If you are 40 years of age or older, have your blood pressure checked every year. You should have your blood pressure measured twice-once when you are at a hospital or clinic, and once when you are not at a hospital or clinic. Record the average of the two measurements. To check your blood pressure when you are not at a hospital or clinic, you can use:  An automated blood pressure machine at a pharmacy.  A home blood pressure monitor.  If you are between 55 years and 79 years old, ask your health care provider if you should take aspirin to prevent strokes.  Have regular diabetes screenings. This involves taking a blood sample to check your fasting blood sugar level.  If you are at a normal weight and have a low risk for diabetes,  have this test once every three years after 54 years of age.  If you are overweight and have a high risk for diabetes, consider being tested at a younger age or more often. Preventing infection Hepatitis B  If you have a higher risk for hepatitis B, you should be screened for this virus. You are considered at high risk for hepatitis B if:  You were born in a country where hepatitis B is common. Ask your health care provider which countries are considered high risk.  Your parents were born in a high-risk country, and you have not been immunized against hepatitis B (hepatitis B vaccine).  You have HIV or AIDS.  You use needles to inject street drugs.  You live with someone who has hepatitis B.  You have had sex with someone who has hepatitis B.  You get hemodialysis treatment.  You take certain medicines for conditions, including cancer, organ transplantation, and autoimmune conditions. Hepatitis C  Blood testing is recommended for:  Everyone born from 1945 through 1965.  Anyone with known risk factors for hepatitis C. Sexually transmitted infections (STIs)  You should be screened for sexually transmitted infections (STIs) including gonorrhea and chlamydia if:  You are sexually active and are younger than 54 years of age.  You are older than 54 years of age and your health care provider tells you that you are at risk for this type of infection.  Your sexual activity has changed since you were last screened and you are at an increased risk for chlamydia or gonorrhea. Ask your health care provider if you are at risk.  If you do not have HIV, but are at risk, it may be recommended that you take a prescription medicine daily to prevent HIV infection. This is called pre-exposure prophylaxis (PrEP). You are considered at risk if:  You are sexually active and do not regularly use condoms or know the HIV status of your partner(s).  You take drugs by injection.  You are sexually  active with a partner who has HIV. Talk with your health care provider about whether you are at high risk of being infected with HIV. If you choose to begin PrEP, you should first be tested for HIV. You should then be tested every 3 months for as long as you are taking PrEP. Pregnancy  If you are premenopausal and you may become pregnant, ask your health care provider about preconception counseling.  If you may become pregnant, take 400 to 800 micrograms (mcg) of folic acid   every day.  If you want to prevent pregnancy, talk to your health care provider about birth control (contraception). Osteoporosis and menopause  Osteoporosis is a disease in which the bones lose minerals and strength with aging. This can result in serious bone fractures. Your risk for osteoporosis can be identified using a bone density scan.  If you are 55 years of age or older, or if you are at risk for osteoporosis and fractures, ask your health care provider if you should be screened.  Ask your health care provider whether you should take a calcium or vitamin D supplement to lower your risk for osteoporosis.  Menopause may have certain physical symptoms and risks.  Hormone replacement therapy may reduce some of these symptoms and risks. Talk to your health care provider about whether hormone replacement therapy is right for you. Follow these instructions at home:  Schedule regular health, dental, and eye exams.  Stay current with your immunizations.  Do not use any tobacco products including cigarettes, chewing tobacco, or electronic cigarettes.  If you are pregnant, do not drink alcohol.  If you are breastfeeding, limit how much and how often you drink alcohol.  Limit alcohol intake to no more than 1 drink per day for nonpregnant women. One drink equals 12 ounces of beer, 5 ounces of wine, or 1 ounces of hard liquor.  Do not use street drugs.  Do not share needles.  Ask your health care provider for  help if you need support or information about quitting drugs.  Tell your health care provider if you often feel depressed.  Tell your health care provider if you have ever been abused or do not feel safe at home. This information is not intended to replace advice given to you by your health care provider. Make sure you discuss any questions you have with your health care provider. Document Released: 01/09/2011 Document Revised: 12/02/2015 Document Reviewed: 03/30/2015  2017 [employer]  Health Maintenance for Postmenopausal Women Introduction Menopause is a normal process in which your reproductive ability comes to an end. This process happens gradually over a span of months to years, usually between the ages of 63 and 11. Menopause is complete when you have missed 12 consecutive menstrual periods. It is important to talk with your health care provider about some of the most common conditions that affect postmenopausal women, such as heart disease, cancer, and bone loss (osteoporosis). Adopting a healthy lifestyle and getting preventive care can help to promote your health and wellness. Those actions can also lower your chances of developing some of these common conditions. What should I know about menopause? During menopause, you may experience a number of symptoms, such as:  Moderate-to-severe hot flashes.  Night sweats.  Decrease in sex drive.  Mood swings.  Headaches.  Tiredness.  Irritability.  Memory problems.  Insomnia. Choosing to treat or not to treat menopausal changes is an individual decision that you make with your health care provider. What should I know about hormone replacement therapy and supplements? Hormone therapy products are effective for treating symptoms that are associated with menopause, such as hot flashes and night sweats. Hormone replacement carries certain risks, especially as you become older. If you are thinking about using estrogen or estrogen with  progestin treatments, discuss the benefits and risks with your health care provider. What should I know about heart disease and stroke? Heart disease, heart attack, and stroke become more likely as you age. This may be due, in part, to the hormonal  changes that your body experiences during menopause. These can affect how your body processes dietary fats, triglycerides, and cholesterol. Heart attack and stroke are both medical emergencies. There are many things that you can do to help prevent heart disease and stroke:  Have your blood pressure checked at least every 1-2 years. High blood pressure causes heart disease and increases the risk of stroke.  If you are 86-51 years old, ask your health care provider if you should take aspirin to prevent a heart attack or a stroke.  Do not use any tobacco products, including cigarettes, chewing tobacco, or electronic cigarettes. If you need help quitting, ask your health care provider.  It is important to eat a healthy diet and maintain a healthy weight.  Be sure to include plenty of vegetables, fruits, low-fat dairy products, and lean protein.  Avoid eating foods that are high in solid fats, added sugars, or salt (sodium).  Get regular exercise. This is one of the most important things that you can do for your health.  Try to exercise for at least 150 minutes each week. The type of exercise that you do should increase your heart rate and make you sweat. This is known as moderate-intensity exercise.  Try to do strengthening exercises at least twice each week. Do these in addition to the moderate-intensity exercise.  Know your numbers.Ask your health care provider to check your cholesterol and your blood glucose. Continue to have your blood tested as directed by your health care provider. What should I know about cancer screening? There are several types of cancer. Take the following steps to reduce your risk and to catch any cancer development as  early as possible. Breast Cancer  Practice breast self-awareness.  This means understanding how your breasts normally appear and feel.  It also means doing regular breast self-exams. Let your health care provider know about any changes, no matter how small.  If you are 61 or older, have a clinician do a breast exam (clinical breast exam or CBE) every year. Depending on your age, family history, and medical history, it may be recommended that you also have a yearly breast X-ray (mammogram).  If you have a family history of breast cancer, talk with your health care provider about genetic screening.  If you are at high risk for breast cancer, talk with your health care provider about having an MRI and a mammogram every year.  Breast cancer (BRCA) gene test is recommended for women who have family members with BRCA-related cancers. Results of the assessment will determine the need for genetic counseling and BRCA1 and for BRCA2 testing. BRCA-related cancers include these types:  Breast. This occurs in males or females.  Ovarian.  Tubal. This may also be called fallopian tube cancer.  Cancer of the abdominal or pelvic lining (peritoneal cancer).  Prostate.  Pancreatic. Cervical, Uterine, and Ovarian Cancer  Your health care provider may recommend that you be screened regularly for cancer of the pelvic organs. These include your ovaries, uterus, and vagina. This screening involves a pelvic exam, which includes checking for microscopic changes to the surface of your cervix (Pap test).  For women ages 21-65, health care providers may recommend a pelvic exam and a Pap test every three years. For women ages 36-65, they may recommend the Pap test and pelvic exam, combined with testing for human papilloma virus (HPV), every five years. Some types of HPV increase your risk of cervical cancer. Testing for HPV may also be done on  women of any age who have unclear Pap test results.  Other health care  providers may not recommend any screening for nonpregnant women who are considered low risk for pelvic cancer and have no symptoms. Ask your health care provider if a screening pelvic exam is right for you.  If you have had past treatment for cervical cancer or a condition that could lead to cancer, you need Pap tests and screening for cancer for at least 20 years after your treatment. If Pap tests have been discontinued for you, your risk factors (such as having a new sexual partner) need to be reassessed to determine if you should start having screenings again. Some women have medical problems that increase the chance of getting cervical cancer. In these cases, your health care provider may recommend that you have screening and Pap tests more often.  If you have a family history of uterine cancer or ovarian cancer, talk with your health care provider about genetic screening.  If you have vaginal bleeding after reaching menopause, tell your health care provider.  There are currently no reliable tests available to screen for ovarian cancer. Lung Cancer  Lung cancer screening is recommended for adults 47-80 years old who are at high risk for lung cancer because of a history of smoking. A yearly low-dose CT scan of the lungs is recommended if you:  Currently smoke.  Have a history of at least 30 pack-years of smoking and you currently smoke or have quit within the past 15 years. A pack-year is smoking an average of one pack of cigarettes per day for one year. Yearly screening should:  Continue until it has been 15 years since you quit.  Stop if you develop a health problem that would prevent you from having lung cancer treatment. Colorectal Cancer  This type of cancer can be detected and can often be prevented.  Routine colorectal cancer screening usually begins at age 71 and continues through age 75.  If you have risk factors for colon cancer, your health care provider may recommend that you  be screened at an earlier age.  If you have a family history of colorectal cancer, talk with your health care provider about genetic screening.  Your health care provider may also recommend using home test kits to check for hidden blood in your stool.  A small camera at the end of a tube can be used to examine your colon directly (sigmoidoscopy or colonoscopy). This is done to check for the earliest forms of colorectal cancer.  Direct examination of the colon should be repeated every 5-10 years until age 10. However, if early forms of precancerous polyps or small growths are found or if you have a family history or genetic risk for colorectal cancer, you may need to be screened more often. Skin Cancer  Check your skin from head to toe regularly.  Monitor any moles. Be sure to tell your health care provider:  About any new moles or changes in moles, especially if there is a change in a mole's shape or color.  If you have a mole that is larger than the size of a pencil eraser.  If any of your family members has a history of skin cancer, especially at a young age, talk with your health care provider about genetic screening.  Always use sunscreen. Apply sunscreen liberally and repeatedly throughout the day.  Whenever you are outside, protect yourself by wearing long sleeves, pants, a wide-brimmed hat, and sunglasses. What should I  know about osteoporosis? Osteoporosis is a condition in which bone destruction happens more quickly than new bone creation. After menopause, you may be at an increased risk for osteoporosis. To help prevent osteoporosis or the bone fractures that can happen because of osteoporosis, the following is recommended:  If you are 73-25 years old, get at least 1,000 mg of calcium and at least 600 mg of vitamin D per day.  If you are older than age 80 but younger than age 51, get at least 1,200 mg of calcium and at least 600 mg of vitamin D per day.  If you are older than  age 80, get at least 1,200 mg of calcium and at least 800 mg of vitamin D per day. Smoking and excessive alcohol intake increase the risk of osteoporosis. Eat foods that are rich in calcium and vitamin D, and do weight-bearing exercises several times each week as directed by your health care provider. What should I know about how menopause affects my mental health? Depression may occur at any age, but it is more common as you become older. Common symptoms of depression include:  Low or sad mood.  Changes in sleep patterns.  Changes in appetite or eating patterns.  Feeling an overall lack of motivation or enjoyment of activities that you previously enjoyed.  Frequent crying spells. Talk with your health care provider if you think that you are experiencing depression. What should I know about immunizations? It is important that you get and maintain your immunizations. These include:  Tetanus, diphtheria, and pertussis (Tdap) booster vaccine.  Influenza every year before the flu season begins.  Pneumonia vaccine.  Shingles vaccine. Your health care provider may also recommend other immunizations. This information is not intended to replace advice given to you by your health care provider. Make sure you discuss any questions you have with your health care provider. Document Released: 08/18/2005 Document Revised: 01/14/2016 Document Reviewed: 03/30/2015  2017 [employer] Influenza (Flu) Vaccine (Inactivated or Recombinant): What You Need to Know 1. Why get vaccinated? Influenza ("flu") is a contagious disease that spreads around the [place] every year, usually between October and May. Flu is caused by influenza viruses, and is spread mainly by coughing, sneezing, and close contact. Anyone can get flu. Flu strikes suddenly and can last several days. Symptoms vary by age, but can include:  fever/chills  sore throat  muscle aches  fatigue  cough  headache  runny or stuffy  nose Flu can also lead to pneumonia and blood infections, and cause diarrhea and seizures in children. If you have a medical condition, such as heart or lung disease, flu can make it worse. Flu is more dangerous for some people. Infants and young children, people 71 years of age and older, pregnant women, and people with certain health conditions or a weakened immune system are at greatest risk. Each year thousands of people in the [place] die from flu, and many more are hospitalized. Flu vaccine can:  keep you from getting flu,  make flu less severe if you do get it, and  keep you from spreading flu to your family and other people. 2. Inactivated and recombinant flu vaccines A dose of flu vaccine is recommended every flu season. Children 6 months through 81 years of age may need two doses during the same flu season. Everyone else needs only one dose each flu season. Some inactivated flu vaccines contain a very small amount of a mercury-based preservative called thimerosal. Studies have not  shown thimerosal in vaccines to be harmful, but flu vaccines that do not contain thimerosal are available. There is no live flu virus in flu shots. They cannot cause the flu. There are many flu viruses, and they are always changing. Each year a new flu vaccine is made to protect against three or four viruses that are likely to cause disease in the upcoming flu season. But even when the vaccine doesn't exactly match these viruses, it may still provide some protection. Flu vaccine cannot prevent:  flu that is caused by a virus not covered by the vaccine, or  illnesses that look like flu but are not. It takes about 2 weeks for protection to develop after vaccination, and protection lasts through the flu season. 3. Some people should not get this vaccine Tell the person who is giving you the vaccine:  If you have any severe, life-threatening allergies. If you ever had a life-threatening allergic reaction  after a dose of flu vaccine, or have a severe allergy to any part of this vaccine, you may be advised not to get vaccinated. Most, but not all, types of flu vaccine contain a small amount of egg protein.  If you ever had Guillain-Barr Syndrome (also called GBS). Some people with a history of GBS should not get this vaccine. This should be discussed with your doctor.  If you are not feeling well. It is usually okay to get flu vaccine when you have a mild illness, but you might be asked to come back when you feel better. 4. Risks of a vaccine reaction With any medicine, including vaccines, there is a chance of reactions. These are usually mild and go away on their own, but serious reactions are also possible. Most people who get a flu shot do not have any problems with it. Minor problems following a flu shot include:  soreness, redness, or swelling where the shot was given  hoarseness  sore, red or itchy eyes  cough  fever  aches  headache  itching  fatigue If these problems occur, they usually begin soon after the shot and last 1 or 2 days. More serious problems following a flu shot can include the following:  There may be a small increased risk of Guillain-Barre Syndrome (GBS) after inactivated flu vaccine. This risk has been estimated at 1 or 2 additional cases per million people vaccinated. This is much lower than the risk of severe complications from flu, which can be prevented by flu vaccine.  Young children who get the flu shot along with pneumococcal vaccine (PCV13) and/or DTaP vaccine at the same time might be slightly more likely to have a seizure caused by fever. Ask your doctor for more information. Tell your doctor if a child who is getting flu vaccine has ever had a seizure. Problems that could happen after any injected vaccine:  People sometimes faint after a medical procedure, including vaccination. Sitting or lying down for about 15 minutes can help prevent  fainting, and injuries caused by a fall. Tell your doctor if you feel dizzy, or have vision changes or ringing in the ears.  Some people get severe pain in the shoulder and have difficulty moving the arm where a shot was given. This happens very rarely.  Any medication can cause a severe allergic reaction. Such reactions from a vaccine are very rare, estimated at about 1 in a million doses, and would happen within a few minutes to a few hours after the vaccination. As with any medicine,  there is a very remote chance of a vaccine causing a serious injury or death. The safety of vaccines is always being monitored. For more information, visit: http://floyd.org/ 5. What if there is a serious reaction? What should I look for? Look for anything that concerns you, such as signs of a severe allergic reaction, very high fever, or unusual behavior. Signs of a severe allergic reaction can include hives, swelling of the face and throat, difficulty breathing, a fast heartbeat, dizziness, and weakness. These would start a few minutes to a few hours after the vaccination. What should I do?  If you think it is a severe allergic reaction or other emergency that can't wait, call 9-1-1 and get the person to the nearest hospital. Otherwise, call your doctor.  Reactions should be reported to the Vaccine Adverse Event Reporting System (VAERS). Your doctor should file this report, or you can do it yourself through the VAERS web site at www.vaers.LAgents.no, or by calling 1-336 245 6446.  VAERS does not give medical advice. 6. The National Vaccine Injury Compensation Program The [employer] Vaccine Injury Compensation Program (VICP) is a federal program that was created to compensate people who may have been injured by certain vaccines. Persons who believe they may have been injured by a vaccine can learn about the program and about filing a claim by calling 1-320-600-1889 or visiting the VICP website at  SpiritualWord.at. There is a time limit to file a claim for compensation. 7. How can I learn more?  Ask your healthcare provider. He or she can give you the vaccine package insert or suggest other sources of information.  Call your local or state health department.  Contact the Centers for Disease Control and Prevention (CDC):  Call 458-297-5142 (1-800-CDC-INFO) or  Visit CDC's website at BiotechRoom.com.cy Vaccine Information Statement, Inactivated Influenza Vaccine (02/13/2014) This information is not intended to replace advice given to you by your health care provider. Make sure you discuss any questions you have with your health care provider. Document Released: 04/20/2006 Document Revised: 03/16/2016 Document Reviewed: 03/16/2016 Elsevier Interactive Patient Education  2017 Elsevier Inc. Influenza (Flu) Vaccine (Inactivated or Recombinant): What You Need to Know 1. Why get vaccinated? Influenza ("flu") is a contagious disease that spreads around the [place] every year, usually between October and May. Flu is caused by influenza viruses, and is spread mainly by coughing, sneezing, and close contact. Anyone can get flu. Flu strikes suddenly and can last several days. Symptoms vary by age, but can include:  fever/chills  sore throat  muscle aches  fatigue  cough  headache  runny or stuffy nose Flu can also lead to pneumonia and blood infections, and cause diarrhea and seizures in children. If you have a medical condition, such as heart or lung disease, flu can make it worse. Flu is more dangerous for some people. Infants and young children, people 19 years of age and older, pregnant women, and people with certain health conditions or a weakened immune system are at greatest risk. Each year thousands of people in the [place] die from flu, and many more are hospitalized. Flu vaccine can:  keep you from getting flu,  make flu less severe if you do  get it, and  keep you from spreading flu to your family and other people. 2. Inactivated and recombinant flu vaccines A dose of flu vaccine is recommended every flu season. Children 6 months through 46 years of age may need two doses during the same flu season. Everyone else needs  only one dose each flu season. Some inactivated flu vaccines contain a very small amount of a mercury-based preservative called thimerosal. Studies have not shown thimerosal in vaccines to be harmful, but flu vaccines that do not contain thimerosal are available. There is no live flu virus in flu shots. They cannot cause the flu. There are many flu viruses, and they are always changing. Each year a new flu vaccine is made to protect against three or four viruses that are likely to cause disease in the upcoming flu season. But even when the vaccine doesn't exactly match these viruses, it may still provide some protection. Flu vaccine cannot prevent:  flu that is caused by a virus not covered by the vaccine, or  illnesses that look like flu but are not. It takes about 2 weeks for protection to develop after vaccination, and protection lasts through the flu season. 3. Some people should not get this vaccine Tell the person who is giving you the vaccine:  If you have any severe, life-threatening allergies. If you ever had a life-threatening allergic reaction after a dose of flu vaccine, or have a severe allergy to any part of this vaccine, you may be advised not to get vaccinated. Most, but not all, types of flu vaccine contain a small amount of egg protein.  If you ever had Guillain-Barr Syndrome (also called GBS). Some people with a history of GBS should not get this vaccine. This should be discussed with your doctor.  If you are not feeling well. It is usually okay to get flu vaccine when you have a mild illness, but you might be asked to come back when you feel better. 4. Risks of a vaccine reaction With any  medicine, including vaccines, there is a chance of reactions. These are usually mild and go away on their own, but serious reactions are also possible. Most people who get a flu shot do not have any problems with it. Minor problems following a flu shot include:  soreness, redness, or swelling where the shot was given  hoarseness  sore, red or itchy eyes  cough  fever  aches  headache  itching  fatigue If these problems occur, they usually begin soon after the shot and last 1 or 2 days. More serious problems following a flu shot can include the following:  There may be a small increased risk of Guillain-Barre Syndrome (GBS) after inactivated flu vaccine. This risk has been estimated at 1 or 2 additional cases per million people vaccinated. This is much lower than the risk of severe complications from flu, which can be prevented by flu vaccine.  Young children who get the flu shot along with pneumococcal vaccine (PCV13) and/or DTaP vaccine at the same time might be slightly more likely to have a seizure caused by fever. Ask your doctor for more information. Tell your doctor if a child who is getting flu vaccine has ever had a seizure. Problems that could happen after any injected vaccine:  People sometimes faint after a medical procedure, including vaccination. Sitting or lying down for about 15 minutes can help prevent fainting, and injuries caused by a fall. Tell your doctor if you feel dizzy, or have vision changes or ringing in the ears.  Some people get severe pain in the shoulder and have difficulty moving the arm where a shot was given. This happens very rarely.  Any medication can cause a severe allergic reaction. Such reactions from a vaccine are very rare, estimated at about  1 in a million doses, and would happen within a few minutes to a few hours after the vaccination. As with any medicine, there is a very remote chance of a vaccine causing a serious injury or death. The  safety of vaccines is always being monitored. For more information, visit: http://www.aguilar.org/ 5. What if there is a serious reaction? What should I look for? Look for anything that concerns you, such as signs of a severe allergic reaction, very high fever, or unusual behavior. Signs of a severe allergic reaction can include hives, swelling of the face and throat, difficulty breathing, a fast heartbeat, dizziness, and weakness. These would start a few minutes to a few hours after the vaccination. What should I do?  If you think it is a severe allergic reaction or other emergency that can't wait, call 9-1-1 and get the person to the nearest hospital. Otherwise, call your doctor.  Reactions should be reported to the Vaccine Adverse Event Reporting System (VAERS). Your doctor should file this report, or you can do it yourself through the VAERS web site at www.vaers.SamedayNews.es, or by calling (425)482-4094.  VAERS does not give medical advice. 6. The National Vaccine Injury Compensation Program The [employer] Vaccine Injury Compensation Program (VICP) is a federal program that was created to compensate people who may have been injured by certain vaccines. Persons who believe they may have been injured by a vaccine can learn about the program and about filing a claim by calling 585-530-6476 or visiting the [place] website at GoldCloset.com.ee. There is a time limit to file a claim for compensation. 7. How can I learn more?  Ask your healthcare provider. He or she can give you the vaccine package insert or suggest other sources of information.  Call your local or state health department.  Contact the Centers for Disease Control and Prevention (CDC):  Call 575 576 4674 (1-800-CDC-INFO) or  Visit CDC's website at https://gibson.com/ Vaccine Information Statement, Inactivated Influenza Vaccine (02/13/2014) This information is not intended to replace advice given to you by your health  care provider. Make sure you discuss any questions you have with your health care provider. Document Released: 04/20/2006 Document Revised: 03/16/2016 Document Reviewed: 03/16/2016 Elsevier Interactive Patient Education  2017 [employer].

## 2016-06-14 ENCOUNTER — Telehealth: Payer: Self-pay | Admitting: Family Medicine

## 2016-06-14 ENCOUNTER — Encounter: Payer: Self-pay | Admitting: Family Medicine

## 2016-06-14 LAB — CBC WITH DIFFERENTIAL/PLATELET
BASOS ABS: 0 10*3/uL (ref 0.0–0.2)
Basos: 1 %
EOS (ABSOLUTE): 0.1 10*3/uL (ref 0.0–0.4)
Eos: 2 %
Hematocrit: 40.6 % (ref 34.0–46.6)
Hemoglobin: 13.4 g/dL (ref 11.1–15.9)
IMMATURE GRANULOCYTES: 0 %
Immature Grans (Abs): 0 10*3/uL (ref 0.0–0.1)
LYMPHS ABS: 1.8 10*3/uL (ref 0.7–3.1)
Lymphs: 28 %
MCH: 30.9 pg (ref 26.6–33.0)
MCHC: 33 g/dL (ref 31.5–35.7)
MCV: 94 fL (ref 79–97)
MONOS ABS: 0.4 10*3/uL (ref 0.1–0.9)
Monocytes: 7 %
NEUTROS PCT: 62 %
Neutrophils Absolute: 4 10*3/uL (ref 1.4–7.0)
PLATELETS: 240 10*3/uL (ref 150–379)
RBC: 4.34 x10E6/uL (ref 3.77–5.28)
RDW: 13.1 % (ref 12.3–15.4)
WBC: 6.4 10*3/uL (ref 3.4–10.8)

## 2016-06-14 LAB — COMPREHENSIVE METABOLIC PANEL
A/G RATIO: 1.7 (ref 1.2–2.2)
ALK PHOS: 72 IU/L (ref 39–117)
ALT: 50 IU/L — AB (ref 0–32)
AST: 36 IU/L (ref 0–40)
Albumin: 4.3 g/dL (ref 3.5–5.5)
BUN/Creatinine Ratio: 15 (ref 9–23)
BUN: 13 mg/dL (ref 6–24)
Bilirubin Total: 0.4 mg/dL (ref 0.0–1.2)
CALCIUM: 9.4 mg/dL (ref 8.7–10.2)
CO2: 24 mmol/L (ref 18–29)
CREATININE: 0.84 mg/dL (ref 0.57–1.00)
Chloride: 100 mmol/L (ref 96–106)
GFR calc Af Amer: 91 mL/min/{1.73_m2} (ref >59)
GFR, EST NON AFRICAN AMERICAN: 79 mL/min/{1.73_m2} (ref >59)
Globulin, Total: 2.5 g/dL (ref 1.5–4.5)
Glucose: 143 mg/dL — ABNORMAL HIGH (ref 65–99)
POTASSIUM: 4.5 mmol/L (ref 3.5–5.2)
SODIUM: 139 mmol/L (ref 134–144)
Total Protein: 6.8 g/dL (ref 6.0–8.5)

## 2016-06-14 LAB — TSH: TSH: 0.103 u[IU]/mL — AB (ref 0.450–4.500)

## 2016-06-14 NOTE — Telephone Encounter (Signed)
Called to give Carol Becker the results. Slightly over active thyroid. Will call if she starts having palpitations.

## 2016-06-15 ENCOUNTER — Telehealth: Payer: Self-pay | Admitting: Family Medicine

## 2016-06-15 NOTE — Telephone Encounter (Signed)
Called pharmacy, they do have the medication, patient notified.

## 2016-06-15 NOTE — Telephone Encounter (Signed)
Pt states that a cream was supposed to be called in after her visit earlier this week however the pharmacy has not received the RX. Please call the RX in to [facility] on [address].

## 2016-07-17 ENCOUNTER — Encounter: Payer: Self-pay | Admitting: Family Medicine

## 2016-07-17 ENCOUNTER — Ambulatory Visit (INDEPENDENT_AMBULATORY_CARE_PROVIDER_SITE_OTHER): Payer: 59 | Admitting: Family Medicine

## 2016-07-17 VITALS — BP 128/85 | HR 68 | Temp 97.8°F | Wt 204.6 lb

## 2016-07-17 DIAGNOSIS — J069 Acute upper respiratory infection, unspecified: Secondary | ICD-10-CM | POA: Diagnosis not present

## 2016-07-17 MED ORDER — HYDROCOD POLST-CPM POLST ER 10-8 MG/5ML PO SUER: 5.0000 mL | Freq: Two times a day (BID) | ORAL | 0 refills | Status: DC | PRN

## 2016-07-17 MED ORDER — BENZONATATE 100 MG PO CAPS: 200.0000 mg | ORAL_CAPSULE | Freq: Three times a day (TID) | ORAL | 0 refills | Status: DC | PRN

## 2016-07-17 MED ORDER — ONDANSETRON 4 MG PO TBDP: 4.0000 mg | ORAL_TABLET | Freq: Three times a day (TID) | ORAL | 0 refills | Status: DC | PRN

## 2016-07-17 MED ORDER — AMOXICILLIN-POT CLAVULANATE 875-125 MG PO TABS: 1.0000 | ORAL_TABLET | Freq: Two times a day (BID) | ORAL | 0 refills | Status: DC

## 2016-07-17 NOTE — Patient Instructions (Signed)
Probiotic supplement like culturelle Imodium for diarrhea

## 2016-07-17 NOTE — Progress Notes (Signed)
   BP 128/85 (BP Location: Right Arm, Patient Position: Sitting, Cuff Size: Large)   Pulse 68   Temp 97.8 F (36.6 C)   Wt 204 lb 9.6 oz (92.8 kg)   SpO2 91%   BMI 34.79 kg/m    Subjective:    Patient ID: Carol Becker, female    DOB: 1960-10-28, 56 y.o.   MRN: [id number]  HPI: Carol Becker is a 56 y.o. female  Chief Complaint  Patient presents with  . URI    pt states she has had chest and head congestion for a week and a half. States she also has a stomach ache but thinks it is from drainage.    Patient presents with 10 day history of head and chest congestion, sinus pain/pressure, and productive cough with thick sputum. Some intermittent nausea. Denies CP, SOB, fever. Tylenol, dayquil, and nyquil have provided minimal relief. Daughter was sick last week.   Relevant past medical, surgical, family and social history reviewed and updated as indicated. Interim medical history since our last visit reviewed. Allergies and medications reviewed and updated.  Review of Systems  Constitutional: Negative.   HENT: Positive for congestion, sinus pain and sinus pressure.   Eyes: Negative.   Respiratory: Positive for cough.   Cardiovascular: Negative.   Gastrointestinal: Positive for nausea.  Genitourinary: Negative.   Musculoskeletal: Negative.   Neurological: Negative.   Psychiatric/Behavioral: Negative.     Per HPI unless specifically indicated above     Objective:    BP 128/85 (BP Location: Right Arm, Patient Position: Sitting, Cuff Size: Large)   Pulse 68   Temp 97.8 F (36.6 C)   Wt 204 lb 9.6 oz (92.8 kg)   SpO2 91%   BMI 34.79 kg/m   Wt Readings from Last 3 Encounters:  07/17/16 204 lb 9.6 oz (92.8 kg)  06/13/16 206 lb (93.4 kg)  02/10/16 210 lb (95.3 kg)    Physical Exam  Constitutional: She is oriented to person, place, and time. She appears well-developed and well-nourished. No distress.  HENT:  Head: Atraumatic.  Right Ear: External ear normal.    Left Ear: External ear normal.  Nose: Nose normal.  Oropharynx erythematous  Eyes: Conjunctivae are normal. Pupils are equal, round, and reactive to light. No scleral icterus.  Neck: Normal range of motion. Neck supple.  Cardiovascular: Normal rate and normal heart sounds.   Pulmonary/Chest: Effort normal and breath sounds normal. No respiratory distress. She has no wheezes.  Musculoskeletal: Normal range of motion.  Lymphadenopathy:    She has no cervical adenopathy.  Neurological: She is alert and oriented to person, place, and time.  Skin: Skin is warm and dry.  Psychiatric: She has a normal mood and affect. Her behavior is normal.  Nursing note and vitals reviewed.     Assessment & Plan:   Problem List Items Addressed This Visit    None    Visit Diagnoses    Upper respiratory tract infection, unspecified type    -  Primary   Given duration, will treat with augmentin, tessalon, tussionex, and zofran prn for nausea. Supportive care discussed. Precautions reviewed.        Follow up plan: Return if symptoms worsen or fail to improve.

## 2016-07-25 ENCOUNTER — Telehealth: Payer: Self-pay

## 2016-07-25 MED ORDER — DOXYCYCLINE HYCLATE 100 MG PO TABS: 100.0000 mg | ORAL_TABLET | Freq: Two times a day (BID) | ORAL | 0 refills | Status: DC

## 2016-07-25 NOTE — Telephone Encounter (Signed)
Patient notified

## 2016-07-25 NOTE — Telephone Encounter (Signed)
Sent in different antibiotic for her to go ahead and start. Ask her to keep [place] posted on how she's feeling

## 2016-07-25 NOTE — Telephone Encounter (Signed)
Patient called and stated that she had one dose left of her antibiotic and she is not any better. Wants to know what to do.

## 2016-08-16 ENCOUNTER — Other Ambulatory Visit: Payer: Self-pay | Admitting: Family Medicine

## 2016-08-16 DIAGNOSIS — Z1231 Encounter for screening mammogram for malignant neoplasm of breast: Secondary | ICD-10-CM

## 2016-08-28 ENCOUNTER — Other Ambulatory Visit: Payer: Self-pay | Admitting: Family Medicine

## 2016-08-28 NOTE — Telephone Encounter (Signed)
Routing to provider  

## 2016-08-29 ENCOUNTER — Other Ambulatory Visit: Payer: Self-pay | Admitting: *Deleted

## 2016-08-29 NOTE — Telephone Encounter (Signed)
This patient needs an appointment for further refills.

## 2016-08-29 NOTE — Telephone Encounter (Signed)
Sent Cheryl refill request for hydrocodone. Original message was for Tussinex which was incorrect.

## 2016-09-04 ENCOUNTER — Other Ambulatory Visit: Payer: Self-pay | Admitting: Family Medicine

## 2016-09-04 MED ORDER — HYDROCODONE-ACETAMINOPHEN 10-325 MG PO TABS: ORAL_TABLET | ORAL | 0 refills | Status: DC

## 2016-09-04 NOTE — Telephone Encounter (Signed)
OK for her to come get Rx

## 2016-09-04 NOTE — Telephone Encounter (Signed)
Informed patient Rx is up front for pick up.

## 2016-09-11 ENCOUNTER — Ambulatory Visit
Admission: RE | Admit: 2016-09-11 | Discharge: 2016-09-11 | Disposition: A | Discharge status: Home / Self Care | Payer: Commercial Managed Care - HMO | Source: Ambulatory Visit | Attending: Family Medicine | Admitting: Family Medicine

## 2016-09-11 ENCOUNTER — Telehealth: Payer: Self-pay | Admitting: Family Medicine

## 2016-09-11 DIAGNOSIS — Z1231 Encounter for screening mammogram for malignant neoplasm of breast: Secondary | ICD-10-CM | POA: Diagnosis present

## 2016-09-11 NOTE — Telephone Encounter (Signed)
Pt called and stated that she would like an antibiotic sent to [facility] [address] for an upper respiratory infection.

## 2016-09-12 ENCOUNTER — Encounter: Payer: Self-pay | Admitting: Family Medicine

## 2016-09-12 NOTE — Telephone Encounter (Signed)
Transferred patient to [facility] Glad to make appointment.

## 2016-09-13 ENCOUNTER — Ambulatory Visit: Payer: 59 | Admitting: Family Medicine

## 2016-09-14 ENCOUNTER — Encounter: Payer: Self-pay | Admitting: Family Medicine

## 2016-09-14 ENCOUNTER — Ambulatory Visit (INDEPENDENT_AMBULATORY_CARE_PROVIDER_SITE_OTHER): Payer: 59 | Admitting: Family Medicine

## 2016-09-14 VITALS — BP 128/81 | HR 70 | Temp 98.4°F | Wt 203.0 lb

## 2016-09-14 DIAGNOSIS — J069 Acute upper respiratory infection, unspecified: Secondary | ICD-10-CM | POA: Diagnosis not present

## 2016-09-14 MED ORDER — DOXYCYCLINE HYCLATE 100 MG PO TABS: 100.0000 mg | ORAL_TABLET | Freq: Two times a day (BID) | ORAL | 0 refills | Status: DC

## 2016-09-14 NOTE — Progress Notes (Signed)
   BP 128/81   Pulse 70   Temp 98.4 F (36.9 C)   Wt 203 lb (92.1 kg)   SpO2 95%   BMI 34.52 kg/m    Subjective:    Patient ID: Carol Becker, female    DOB: [birth year], 56 y.o.   MRN: [id number]  HPI: Carol Becker is a 56 y.o. female  Chief Complaint  Patient presents with  . URI    x 9 days, head/chest congestion, fatigue, runny nose, sinus drainage. No fever, no body aches, no cough. No sore throat, no ear ache.   Patient presents with 9 day history of congestion, fatigue, rhinorrhea, sinus pressure, and ear pressure. Some mild cough, but no CP, SOB, wheezing, fever, chills. Taking mucinex DM and nyquil with minimal relief. No sick contacts.   Relevant past medical, surgical, family and social history reviewed and updated as indicated. Interim medical history since our last visit reviewed. Allergies and medications reviewed and updated.  Review of Systems  Constitutional: Positive for fatigue.  HENT: Positive for congestion, ear pain, rhinorrhea and sinus pressure.   Eyes: Negative.   Respiratory: Positive for cough.   Cardiovascular: Negative.   Gastrointestinal: Negative.   Genitourinary: Negative.   Musculoskeletal: Negative.   Neurological: Negative.   Psychiatric/Behavioral: Negative.    Per HPI unless specifically indicated above     Objective:    BP 128/81   Pulse 70   Temp 98.4 F (36.9 C)   Wt 203 lb (92.1 kg)   SpO2 95%   BMI 34.52 kg/m   Wt Readings from Last 3 Encounters:  09/14/16 203 lb (92.1 kg)  07/17/16 204 lb 9.6 oz (92.8 kg)  06/13/16 206 lb (93.4 kg)    Physical Exam  Constitutional: She is oriented to person, place, and time. She appears well-developed and well-nourished. No distress.  HENT:  Head: Atraumatic.  Nose: Nose normal.  Mild effusion b/l middle ears Oropharynx erythematous   Eyes: Conjunctivae are normal. Pupils are equal, round, and reactive to light.  Neck: Normal range of motion. Neck supple.    Cardiovascular: Normal rate and normal heart sounds.   Pulmonary/Chest: Effort normal and breath sounds normal. No respiratory distress.  Musculoskeletal: Normal range of motion.  Neurological: She is alert and oriented to person, place, and time.  Skin: Skin is warm and dry.  Psychiatric: She has a normal mood and affect. Her behavior is normal.  Nursing note and vitals reviewed.     Assessment & Plan:   Problem List Items Addressed This Visit    None    Visit Diagnoses    Upper respiratory tract infection, unspecified type    -  Primary   Doxycycline sent as pt seems to do better with it than others. Continue OTC meds for symptomatic relief. Rest, good hydration. F/u if no improvement.        Follow up plan: Return if symptoms worsen or fail to improve.

## 2016-09-17 NOTE — Patient Instructions (Signed)
Follow up as needed

## 2016-10-09 ENCOUNTER — Other Ambulatory Visit: Payer: 59

## 2016-10-09 ENCOUNTER — Other Ambulatory Visit: Payer: Self-pay | Admitting: Family Medicine

## 2016-10-09 ENCOUNTER — Telehealth: Payer: Self-pay | Admitting: Family Medicine

## 2016-10-09 DIAGNOSIS — R300 Dysuria: Secondary | ICD-10-CM

## 2016-10-09 DIAGNOSIS — R310 Gross hematuria: Secondary | ICD-10-CM

## 2016-10-09 DIAGNOSIS — Z1239 Encounter for other screening for malignant neoplasm of breast: Secondary | ICD-10-CM

## 2016-10-09 MED ORDER — CIPROFLOXACIN HCL 500 MG PO TABS: 500.0000 mg | ORAL_TABLET | Freq: Two times a day (BID) | ORAL | 0 refills | Status: DC

## 2016-10-09 NOTE — Telephone Encounter (Signed)
Called and spoke with Carol Becker. She notes that she's having some hematuria and mild pressure, but no pain. Will treat with cipro as UA + and await culture. Will return when done with Abx to make sure it's resolved.

## 2016-10-12 ENCOUNTER — Other Ambulatory Visit: Payer: Self-pay | Admitting: Family Medicine

## 2016-10-13 ENCOUNTER — Telehealth: Payer: Self-pay | Admitting: Family Medicine

## 2016-10-13 ENCOUNTER — Encounter: Payer: Self-pay | Admitting: Family Medicine

## 2016-10-13 ENCOUNTER — Other Ambulatory Visit: Payer: 59

## 2016-10-13 DIAGNOSIS — R310 Gross hematuria: Secondary | ICD-10-CM

## 2016-10-13 LAB — UA/M W/RFLX CULTURE, ROUTINE
BILIRUBIN UA: NEGATIVE
GLUCOSE, UA: NEGATIVE
KETONES UA: NEGATIVE
NITRITE UA: NEGATIVE
PROTEIN UA: NEGATIVE
SPEC GRAV UA: 1.02 (ref 1.005–1.030)
Urobilinogen, Ur: 0.2 mg/dL (ref 0.2–1.0)
pH, UA: 5.5 (ref 5.0–7.5)

## 2016-10-13 LAB — URINE CULTURE, REFLEX

## 2016-10-13 LAB — MICROSCOPIC EXAMINATION: Bacteria, UA: NONE SEEN

## 2016-10-13 NOTE — Telephone Encounter (Signed)
Called to let her know that her urine looks clear, still having a bit of pressure. Will wait on culture results.

## 2016-10-15 LAB — UA/M W/RFLX CULTURE, ROUTINE
BILIRUBIN UA: NEGATIVE
Glucose, UA: NEGATIVE
KETONES UA: NEGATIVE
Nitrite, UA: NEGATIVE
PROTEIN UA: NEGATIVE
Urobilinogen, Ur: 0.2 mg/dL (ref 0.2–1.0)
pH, UA: 5.5 (ref 5.0–7.5)

## 2016-10-15 LAB — URINE CULTURE, REFLEX

## 2016-10-15 LAB — MICROSCOPIC EXAMINATION
Bacteria, UA: NONE SEEN
RBC, UA: NONE SEEN /hpf (ref 0–?)
WBC, UA: NONE SEEN /hpf (ref 0–?)

## 2016-10-16 ENCOUNTER — Telehealth: Payer: Self-pay | Admitting: Family Medicine

## 2016-10-16 ENCOUNTER — Encounter: Payer: Self-pay | Admitting: Family Medicine

## 2016-10-16 MED ORDER — CIPROFLOXACIN HCL 500 MG PO TABS: 500.0000 mg | ORAL_TABLET | Freq: Two times a day (BID) | ORAL | 0 refills | Status: DC

## 2016-10-16 NOTE — Telephone Encounter (Signed)
Patient called to let Dr Wynetta Emery know that she had been having lower abdomen cramping and tightness still and Sunday when she emptied her bladder she had pain in right side which has resided since yesterday.  Thank you  Santiago Glad

## 2016-10-16 NOTE — Telephone Encounter (Signed)
UA looked good, but given symptoms will treat another 4 days, Rx sent to her pharmacy.

## 2016-11-08 ENCOUNTER — Other Ambulatory Visit: Payer: Self-pay | Admitting: Family Medicine

## 2016-11-08 NOTE — Telephone Encounter (Signed)
Routing to provider  

## 2016-11-08 NOTE — Telephone Encounter (Signed)
Patient called to request a refill for her RX: hydrocodone-aceptaminophen (Norco) 10-325 MG tablet. Please advise.  Patient contact number: [phone]  [place], [place], [place]: [phone]  Thank you.

## 2016-11-09 MED ORDER — HYDROCODONE-ACETAMINOPHEN 10-325 MG PO TABS: ORAL_TABLET | ORAL | 0 refills | Status: DC

## 2016-11-09 NOTE — Telephone Encounter (Signed)
We cannot call that in. Rx up front for her to pick up

## 2016-12-12 ENCOUNTER — Ambulatory Visit: Payer: 59 | Admitting: Family Medicine

## 2016-12-17 ENCOUNTER — Other Ambulatory Visit: Payer: Self-pay | Admitting: Family Medicine

## 2016-12-19 ENCOUNTER — Encounter: Payer: Self-pay | Admitting: Family Medicine

## 2016-12-19 ENCOUNTER — Ambulatory Visit (INDEPENDENT_AMBULATORY_CARE_PROVIDER_SITE_OTHER): Payer: 59 | Admitting: Family Medicine

## 2016-12-19 VITALS — BP 126/80 | HR 70 | Temp 98.7°F | Ht 64.0 in | Wt 203.4 lb

## 2016-12-19 DIAGNOSIS — R7303 Prediabetes: Secondary | ICD-10-CM

## 2016-12-19 DIAGNOSIS — E782 Mixed hyperlipidemia: Secondary | ICD-10-CM | POA: Diagnosis not present

## 2016-12-19 DIAGNOSIS — F3181 Bipolar II disorder: Secondary | ICD-10-CM | POA: Diagnosis not present

## 2016-12-19 DIAGNOSIS — J069 Acute upper respiratory infection, unspecified: Secondary | ICD-10-CM

## 2016-12-19 DIAGNOSIS — Z6835 Body mass index (BMI) 35.0-35.9, adult: Secondary | ICD-10-CM

## 2016-12-19 DIAGNOSIS — E039 Hypothyroidism, unspecified: Secondary | ICD-10-CM

## 2016-12-19 DIAGNOSIS — M5030 Other cervical disc degeneration, unspecified cervical region: Secondary | ICD-10-CM

## 2016-12-19 DIAGNOSIS — M47812 Spondylosis without myelopathy or radiculopathy, cervical region: Secondary | ICD-10-CM | POA: Insufficient documentation

## 2016-12-19 LAB — BAYER DCA HB A1C WAIVED: HB A1C (BAYER DCA - WAIVED): 5.8 % (ref <7.0)

## 2016-12-19 MED ORDER — FLUOXETINE HCL 20 MG PO TABS: 20.0000 mg | ORAL_TABLET | Freq: Every day | ORAL | 1 refills | Status: DC

## 2016-12-19 MED ORDER — GABAPENTIN 600 MG PO TABS
600.0000 mg | ORAL_TABLET | Freq: Every day | ORAL | 1 refills | Status: DC
Start: 2016-12-19 — End: 2017-06-21

## 2016-12-19 MED ORDER — ONDANSETRON 4 MG PO TBDP: 4.0000 mg | ORAL_TABLET | Freq: Three times a day (TID) | ORAL | 0 refills | Status: DC | PRN

## 2016-12-19 MED ORDER — HYDROCODONE-ACETAMINOPHEN 10-325 MG PO TABS: ORAL_TABLET | ORAL | 0 refills | Status: DC

## 2016-12-19 NOTE — Assessment & Plan Note (Signed)
Refill of pain medicine given- likely due to computer placement at work. Call with any concerns.

## 2016-12-19 NOTE — Progress Notes (Signed)
BP 126/80 (BP Location: Left Arm, Patient Position: Sitting, Cuff Size: Normal)   Pulse 70   Temp 98.7 F (37.1 C)   Ht 5\' 4"  (1.626 m)   Wt 203 lb 6.4 oz (92.3 kg)   SpO2 97%   BMI 34.91 kg/m    Subjective:    Patient ID: Carol Becker, female    DOB: 06-Sep-1960, 56 y.o.   MRN: [id number]  HPI: [name] is a 56 y.o. female  Chief Complaint  Patient presents with  . Follow-up   HYPOTHYROIDISM Thyroid control status:uncontrolled Satisfied with current treatment? no Medication side effects: no Medication compliance: excellent compliance Recent dose adjustment:no Fatigue: yes Cold intolerance: yes Heat intolerance: no Weight gain: no Weight loss: no- but has drastically changed her diet Constipation: yes Diarrhea/loose stools: no Palpitations: no Lower extremity edema: no Anxiety/depressed mood: no  BIPOLAR Mood status: controlled Satisfied with current treatment?: yes Symptom severity: mild  Duration of current treatment : chronic Side effects: no Medication compliance: excellent compliance Psychotherapy/counseling: no  Depressed mood: no Anxious mood: no Anhedonia: no Significant weight loss or gain: no Insomnia: no  Fatigue: yes Feelings of worthlessness or guilt: no Impaired concentration/indecisiveness: no Suicidal ideations: no Hopelessness: no Crying spells: no Depression screen [facility] 2/9 12/19/2016 06/13/2016 04/20/2015  Decreased Interest 0 0 1  Down, Depressed, Hopeless 1 0 1  PHQ - 2 Score 1 0 2   Impaired Fasting Glucose HbA1C: 5.8 Duration of elevated blood sugar:  Polydipsia: no Polyuria: no Weight change: no Visual disturbance: no Glucose Monitoring: no Diabetic Education: Not Completed Family history of diabetes: no  HYPERLIPIDEMIA Hyperlipidemia status: excellent compliance Satisfied with current treatment?  no Side effects:  no Medication compliance: excellent compliance Past cholesterol meds:  simvastatin Supplements: none Aspirin:  no The 10-year ASCVD risk score Mikey Bussing DC Jr., et al., 2013) is: 1.6%   Values used to calculate the score:     Age: 26 years     Sex: Female     Is Non-Hispanic African American: No     Diabetic: No     Tobacco smoker: No     Systolic Blood Pressure: 893 mmHg     Is BP treated: No     HDL Cholesterol: 45 mg/dL     Total Cholesterol: 155 mg/dL Chest pain:  no Coronary artery disease:  no Family history CAD:  yes  Relevant past medical, surgical, family and social history reviewed and updated as indicated. Interim medical history since our last visit reviewed. Allergies and medications reviewed and updated.  Review of Systems  Constitutional: Positive for fatigue. Negative for activity change, appetite change, chills, diaphoresis, fever and unexpected weight change.  HENT: Negative.   Respiratory: Negative.   Cardiovascular: Negative.   Gastrointestinal: Negative.   Musculoskeletal: Positive for myalgias, neck pain and neck stiffness. Negative for arthralgias, back pain, gait problem and joint swelling.  Neurological: Negative.   Psychiatric/Behavioral: Negative.     Per HPI unless specifically indicated above     Objective:    BP 126/80 (BP Location: Left Arm, Patient Position: Sitting, Cuff Size: Normal)   Pulse 70   Temp 98.7 F (37.1 C)   Ht 5\' 4"  (1.626 m)   Wt 203 lb 6.4 oz (92.3 kg)   SpO2 97%   BMI 34.91 kg/m   Wt Readings from Last 3 Encounters:  12/19/16 203 lb 6.4 oz (92.3 kg)  09/14/16 203 lb (92.1 kg)  07/17/16 204 lb 9.6 oz (92.8 kg)  Physical Exam  Constitutional: She is oriented to person, place, and time. She appears well-developed and well-nourished. No distress.  HENT:  Head: Normocephalic and atraumatic.  Right Ear: Hearing normal.  Left Ear: Hearing normal.  Nose: Nose normal.  Eyes: Conjunctivae and lids are normal. Right eye exhibits no discharge. Left eye exhibits no discharge. No scleral  icterus.  Cardiovascular: Normal rate, regular rhythm, normal heart sounds and intact distal pulses.  Exam reveals no gallop and no friction rub.   No murmur heard. Pulmonary/Chest: Effort normal and breath sounds normal. No respiratory distress. She has no wheezes. She has no rales. She exhibits no tenderness.  Musculoskeletal: Normal range of motion.  Neurological: She is alert and oriented to person, place, and time.  Skin: Skin is warm, dry and intact. No rash noted. She is not diaphoretic. No erythema. No pallor.  Psychiatric: She has a normal mood and affect. Her speech is normal and behavior is normal. Judgment and thought content normal. Cognition and memory are normal.  Nursing note and vitals reviewed.   Results for orders placed or performed in visit on 12/19/16  Bayer DCA Hb A1c Waived  Result Value Ref Range   Bayer DCA Hb A1c Waived 5.8 <7.0 %      Assessment & Plan:   Problem List Items Addressed This Visit      Endocrine   Thyroid activity decreased - Primary    Having symptoms. Will check TSH and adjust dose as needed.       Relevant Orders   Comprehensive metabolic panel   TSH     Musculoskeletal and Integument   DJD (degenerative joint disease), cervical    Refill of pain medicine given- likely due to computer placement at work. Call with any concerns.       Relevant Medications   HYDROcodone-acetaminophen (NORCO) 10-325 MG tablet     Other   Hyperlipidemia    Rechecking levels today. Await results. If doing really well, will stop medication.       Relevant Orders   Comprehensive metabolic panel   Lipid Panel w/o Chol/HDL Ratio   Bipolar 2 disorder (HCC)    Under good control. Call with any concerns. Refills given.       Relevant Orders   Comprehensive metabolic panel   Prediabetes    Under good control with A1c of 5.8. Continue current regimen. Continue to monitor. Call with any concerns.       Relevant Orders   Bayer DCA Hb A1c Waived  (Completed)   Comprehensive metabolic panel   Obesity    Continue diet and exercise. Has not gained or lost. Rechecking thyroid. Call with any concerns.       Relevant Orders   Comprehensive metabolic panel    Other Visit Diagnoses    Recurrent URI (upper respiratory infection)       If happens again, will see ENT. Call with any concerns.        Follow up plan: Return in about 6 months (around 06/20/2017) for Physical.

## 2016-12-19 NOTE — Assessment & Plan Note (Signed)
Having symptoms. Will check TSH and adjust dose as needed.

## 2016-12-19 NOTE — Assessment & Plan Note (Signed)
Under good control. Call with any concerns. Refills given.

## 2016-12-19 NOTE — Assessment & Plan Note (Signed)
Under good control with A1c of 5.8. Continue current regimen. Continue to monitor. Call with any concerns.

## 2016-12-19 NOTE — Assessment & Plan Note (Signed)
Continue diet and exercise. Has not gained or lost. Rechecking thyroid. Call with any concerns.

## 2016-12-19 NOTE — Assessment & Plan Note (Signed)
Rechecking levels today. Await results. If doing really well, will stop medication.

## 2016-12-20 ENCOUNTER — Telehealth: Payer: Self-pay | Admitting: Family Medicine

## 2016-12-20 LAB — LIPID PANEL W/O CHOL/HDL RATIO
CHOLESTEROL TOTAL: 144 mg/dL (ref 100–199)
HDL: 42 mg/dL (ref >39)
LDL CALC: 68 mg/dL (ref 0–99)
TRIGLYCERIDES: 172 mg/dL — AB (ref 0–149)
VLDL CHOLESTEROL CAL: 34 mg/dL (ref 5–40)

## 2016-12-20 LAB — COMPREHENSIVE METABOLIC PANEL
ALK PHOS: 72 IU/L (ref 39–117)
ALT: 33 IU/L — ABNORMAL HIGH (ref 0–32)
AST: 25 IU/L (ref 0–40)
Albumin/Globulin Ratio: 1.7 (ref 1.2–2.2)
Albumin: 4.4 g/dL (ref 3.5–5.5)
BUN/Creatinine Ratio: 13 (ref 9–23)
BUN: 11 mg/dL (ref 6–24)
Bilirubin Total: 0.3 mg/dL (ref 0.0–1.2)
CO2: 22 mmol/L (ref 20–29)
CREATININE: 0.85 mg/dL (ref 0.57–1.00)
Calcium: 9.2 mg/dL (ref 8.7–10.2)
Chloride: 101 mmol/L (ref 96–106)
GFR calc Af Amer: 90 mL/min/{1.73_m2} (ref >59)
GFR calc non Af Amer: 78 mL/min/{1.73_m2} (ref >59)
GLUCOSE: 95 mg/dL (ref 65–99)
Globulin, Total: 2.6 g/dL (ref 1.5–4.5)
Potassium: 4.2 mmol/L (ref 3.5–5.2)
SODIUM: 138 mmol/L (ref 134–144)
Total Protein: 7 g/dL (ref 6.0–8.5)

## 2016-12-20 LAB — TSH: TSH: 0.206 u[IU]/mL — ABNORMAL LOW (ref 0.450–4.500)

## 2016-12-20 MED ORDER — LEVOTHYROXINE SODIUM 75 MCG PO TABS: 75.0000 ug | ORAL_TABLET | Freq: Every day | ORAL | 0 refills | Status: DC

## 2016-12-20 NOTE — Telephone Encounter (Signed)
Called and gave her results. Her thyroid is a little over active. She does better with this- no symptoms. Still having fatigue and constipation. Would like to change to name brand synthroid. Rx sent to her pharmacy- for residual symptoms if we need to do a PA

## 2016-12-28 ENCOUNTER — Other Ambulatory Visit: Payer: Self-pay | Admitting: Family Medicine

## 2017-01-05 ENCOUNTER — Encounter: Payer: Self-pay | Admitting: Family Medicine

## 2017-01-05 ENCOUNTER — Ambulatory Visit (INDEPENDENT_AMBULATORY_CARE_PROVIDER_SITE_OTHER): Payer: 59 | Admitting: Family Medicine

## 2017-01-05 VITALS — BP 132/85 | HR 64 | Temp 99.1°F | Wt 205.0 lb

## 2017-01-05 DIAGNOSIS — M25552 Pain in left hip: Secondary | ICD-10-CM

## 2017-01-05 MED ORDER — PREDNISONE 10 MG PO TABS: ORAL_TABLET | ORAL | 0 refills | Status: DC

## 2017-01-05 NOTE — Progress Notes (Signed)
   BP 132/85   Pulse 64   Temp 99.1 F (37.3 C)   Wt 205 lb (93 kg)   SpO2 97%   BMI 35.19 kg/m    Subjective:    Patient ID: Carol Becker, female    DOB: [birth year], 56 y.o.   MRN: [id number]  HPI: Carol Becker is a 56 y.o. female  Chief Complaint  Patient presents with  . Hip Pain    left hip. off and on x 6 months, woke up Monday night and it's been worse since. wakes her up at night. Not painful to walk but painful to tocuh   Patient presents with worsening left hip pain x 4 days. Has been present x 6 months but much worse lately. States she sleeps on that side and the pressure of laying on it is what hurts. No pain with walking or other movements, only when pressure is applied. No known injury. Takes occasional flexeril and hydrocodone for other back issues, but has not taken any for this.   Relevant past medical, surgical, family and social history reviewed and updated as indicated. Interim medical history since our last visit reviewed. Allergies and medications reviewed and updated.  Review of Systems  Constitutional: Negative.   HENT: Negative.   Eyes: Negative.   Respiratory: Negative.   Cardiovascular: Negative.   Gastrointestinal: Negative.   Genitourinary: Negative.   Musculoskeletal: Positive for arthralgias.  Neurological: Negative.   Psychiatric/Behavioral: Negative.     Per HPI unless specifically indicated above     Objective:    BP 132/85   Pulse 64   Temp 99.1 F (37.3 C)   Wt 205 lb (93 kg)   SpO2 97%   BMI 35.19 kg/m   Wt Readings from Last 3 Encounters:  01/05/17 205 lb (93 kg)  12/19/16 203 lb 6.4 oz (92.3 kg)  09/14/16 203 lb (92.1 kg)    Physical Exam  Constitutional: She is oriented to person, place, and time. She appears well-developed and well-nourished. No distress.  HENT:  Head: Atraumatic.  Eyes: Conjunctivae are normal. Pupils are equal, round, and reactive to light.  Neck: Normal range of motion. Neck supple.    Cardiovascular: Normal rate and normal heart sounds.   Pulmonary/Chest: Effort normal and breath sounds normal. No respiratory distress.  Musculoskeletal: Normal range of motion.  Neurological: She is alert and oriented to person, place, and time.  Skin: Skin is warm and dry.  Psychiatric: She has a normal mood and affect. Her behavior is normal.  Nursing note and vitals reviewed.     Assessment & Plan:   Problem List Items Addressed This Visit    None    Visit Diagnoses    Left hip pain    -  Primary   Suspect bursitis, will obtain x-ray given persistence and start some prednisone in addition to the flexeril and hydrocodone. Sleep on opposite side   Relevant Orders   DG HIP UNILAT WITH PELVIS 2-3 VIEWS LEFT       Follow up plan: Return if symptoms worsen or fail to improve.

## 2017-01-15 ENCOUNTER — Other Ambulatory Visit: Payer: Self-pay | Admitting: Family Medicine

## 2017-03-22 ENCOUNTER — Ambulatory Visit: Payer: 59 | Admitting: Family Medicine

## 2017-03-26 ENCOUNTER — Other Ambulatory Visit: Payer: Self-pay | Admitting: Family Medicine

## 2017-03-26 ENCOUNTER — Telehealth: Payer: Self-pay | Admitting: Family Medicine

## 2017-03-26 DIAGNOSIS — R197 Diarrhea, unspecified: Secondary | ICD-10-CM

## 2017-03-26 DIAGNOSIS — M5030 Other cervical disc degeneration, unspecified cervical region: Secondary | ICD-10-CM | POA: Insufficient documentation

## 2017-03-26 MED ORDER — CIPROFLOXACIN HCL 500 MG PO TABS: 500.0000 mg | ORAL_TABLET | Freq: Two times a day (BID) | ORAL | 0 refills | Status: DC

## 2017-03-26 MED ORDER — HYDROCODONE-ACETAMINOPHEN 10-325 MG PO TABS
ORAL_TABLET | ORAL | 0 refills | Status: DC
Start: 2017-03-26 — End: 2017-05-21

## 2017-03-26 NOTE — Telephone Encounter (Signed)
Called and spoke with Carol Becker. Not doing well. Diarrhea every 1-2 hours on both pepto bismol and imodium. Still waiting on stool studies to come back. UC wants her to see GI- so does she. No better on augmentin or flagyl. Will change to cipro (rx to her pharmacy) and will put in urgent referral to GI. She saw Dr. Tiffany Kocher in the past and would like to see her. Referral generated today.   Kristin Bruins- can we please see about getting her scheduled sooner rather than later. I don't care who in that office she sees, but if we could get her in this week, that'd be awesome!

## 2017-03-26 NOTE — Telephone Encounter (Signed)
Patient notified

## 2017-03-26 NOTE — Telephone Encounter (Signed)
Routing to provider  

## 2017-03-26 NOTE — Telephone Encounter (Signed)
Called and spoke with patient, she went to Urgent Care due to severe diarrhea with bloating, bubbling and gas. She states that she has not had a solid stool since last Sunday, and she is going every 1-2 hours. Patient states that they gave her an antibiotic, which has not helped so they told her to stop that. She is still waiting on some of the stool cultures to come back, but the C Diff was negative. They recommend that patient goes to see a GI doctor.  Patient would like to discuss this with you.

## 2017-03-26 NOTE — Telephone Encounter (Signed)
Patient forgot to tell Dr Wynetta Emery she is going to need a refill on her Hydrocodone within a couple of days.   Thank You

## 2017-03-26 NOTE — Telephone Encounter (Signed)
Rx up front for her to pick up.

## 2017-03-26 NOTE — Telephone Encounter (Signed)
Patient called and would like to speak with Tiffany regarding her stomach issues that she has been having. She was seen at the ER but is still having these issues and wanted to speak with Tiffany.   [phone]  Thank You

## 2017-03-27 ENCOUNTER — Telehealth: Payer: Self-pay | Admitting: Family Medicine

## 2017-03-27 NOTE — Telephone Encounter (Signed)
[facility] with [facility] needs someone to call her back regarding needing patient information per patient Dr Jeananne Rama had ordered colonoscopy in the past and if so they need a copy of it.    Please advise  Thanks  Shirlean Mylar [phone]

## 2017-03-27 NOTE — Telephone Encounter (Signed)
Faxed

## 2017-03-28 DIAGNOSIS — R197 Diarrhea, unspecified: Secondary | ICD-10-CM | POA: Insufficient documentation

## 2017-04-03 ENCOUNTER — Telehealth: Payer: Self-pay | Admitting: Family Medicine

## 2017-04-03 ENCOUNTER — Other Ambulatory Visit
Admission: RE | Admit: 2017-04-03 | Discharge: 2017-04-03 | Disposition: A | Discharge status: Home / Self Care | Payer: 59 | Source: Ambulatory Visit | Attending: Unknown Physician Specialty | Admitting: Unknown Physician Specialty

## 2017-04-03 DIAGNOSIS — R197 Diarrhea, unspecified: Secondary | ICD-10-CM | POA: Diagnosis present

## 2017-04-03 LAB — GASTROINTESTINAL PANEL BY PCR, STOOL (REPLACES STOOL CULTURE)

## 2017-04-03 NOTE — Telephone Encounter (Signed)
Patient is still having diarrhea and is wanting [place] to get all the tests that Dr Vira Agar had done and send her to Dr Allen Norris.  She would like a 2nd opinion   She feels she has had several tests and no answers  Thank you  [phone]

## 2017-04-04 NOTE — Telephone Encounter (Signed)
Notified patient that she will need to get her records from [place], please send a new referral to Dr.Wohl in [place].

## 2017-04-04 NOTE — Telephone Encounter (Signed)
New referral generated- Keri, can you see if we can get her in sooner than later? She'd been having diarrhea every 1-2 hours on both pepto bismol and imodium. Has seen Dr. Tiffany Kocher, but wants a 2nd opinion, saw Dr. Allen Norris for colonoscopy in 2014. Thanks!

## 2017-04-05 NOTE — Telephone Encounter (Signed)
Patient appointment scheduled with Dr. Allen Norris.

## 2017-04-09 ENCOUNTER — Other Ambulatory Visit: Payer: Self-pay

## 2017-04-09 ENCOUNTER — Ambulatory Visit (INDEPENDENT_AMBULATORY_CARE_PROVIDER_SITE_OTHER): Payer: 59 | Admitting: Gastroenterology

## 2017-04-09 ENCOUNTER — Encounter: Payer: Self-pay | Admitting: Gastroenterology

## 2017-04-09 ENCOUNTER — Encounter (INDEPENDENT_AMBULATORY_CARE_PROVIDER_SITE_OTHER): Payer: Self-pay

## 2017-04-09 DIAGNOSIS — R197 Diarrhea, unspecified: Secondary | ICD-10-CM | POA: Diagnosis not present

## 2017-04-09 NOTE — Progress Notes (Signed)
Carol Darby, MD [address]  [place]  [place], [place] [address]  Main: [phone]  Fax: [phone]    Gastroenterology Consultation  Referring Provider:     Valerie Roys, DO Primary Care Physician:  Valerie Roys, DO Primary Gastroenterologist:  Dr. Cephas Becker Reason for Consultation:   Acute diarrhea        HPI:   Carol Becker is a 56 y.o. y/o female referred by Dr. Wynetta Emery, Barb Merino, DO  for consultation & management of Diarrhea. Her diarrhea started on sep 9th after she returned from [place]. She started experiencing severe gurgling/rumbling, explosive diarrhea almost every hour, watery, she initially has a large BM followed by smaller amounts, and nonbloody. She went to walk-in on 03/21/2017, was prescribed Augmentin. Stool studies on 03/22/2017 were negative for C. difficile, ova, parasites, and other infectious etiology. Said she took Pepto then Imodium. She saw Dr. Vira Agar due to persistent diarrhea and was started on ciprofloxacin. Her symptoms didn't resolve and she was recommended colonoscopy but she did not schedule one. ESR was normal, there was no leukocytosis. She came to see me today for a second opinion. She reports that this past weekend her diarrhea resolved spontaneously. She also reports that eating fatty foods, fried foods results in upper abdominal pain and diarrhea She is currently having 2-3 semi-formed BMs since Saturday. I had to this episode, she was having regular bowel movements, 2-3 daily which are more harder in consistency. Denies any weight loss in last 4 weeks. Denies abdominal pain, nausea, vomiting, fever, chills, blood in stools. She denies eating anything out of ordinary. Denies taking NSAIDs. Denies any sick contacts Her daughter has ulcerative colitis She is an ex-smoker, quit 6 years ago. Smoked for 30 years, 2 packs per day. Denies any abdominal surgeries GI Procedures: Dr. Allen Norris did a colonoscopy and she is due in  2019 EGD several years ago was found to have ulcers  Past Medical History:  Diagnosis Date  . Bipolar 1 disorder ([place])   . Cervical spinal stenosis   . GERD (gastroesophageal reflux disease)   . Hyperlipidemia   . Osteopenia   . Sleep apnea   . Thyroid disease     Past Surgical History:  Procedure Laterality Date  . ANTERIOR / POSTERIOR COMBINED FUSION CERVICAL SPINE    . KNEE ARTHROSCOPY WITH EXCISION BAKER'S CYST    . TUBAL LIGATION      Prior to Admission medications   Medication Sig Start Date End Date Taking? Authorizing Provider  chlorhexidine (PERIDEX) 0.12 % solution RINSE WITH 1/2 OZ FOR 30 SECONDS AFTER THROUGH BRUSHING THREE TIMES DAILY 03/21/17   [provider]  cyclobenzaprine (FLEXERIL) 10 MG tablet Take 10 mg by mouth 3 (three) times daily as needed. for muscle spams 04/15/15   [provider]  FLUoxetine (PROZAC) 20 MG tablet Take 1 tablet (20 mg total) by mouth daily. 12/19/16   Johnson, Megan P, DO  gabapentin (NEURONTIN) 600 MG tablet Take 1 tablet (600 mg total) by mouth at bedtime. TAKE 1 TABLET (600 MG TOTAL) BY MOUTH 2 (TWO) TIMES DAILY. 12/19/16   Johnson, Megan P, DO  gabapentin (NEURONTIN) 600 MG tablet TAKE 1 TABLET (600 MG TOTAL) BY MOUTH 2 (TWO) TIMES DAILY. 01/15/17   Johnson, Megan P, DO  HYDROcodone-acetaminophen (NORCO) 10-325 MG tablet TAKE 1/2-1 TABLET BY ORAL ROUTE EVERY 12 HOURS AS NEEDED FOR PAIN 03/26/17   Park Liter P, DO  levothyroxine (SYNTHROID, LEVOTHROID) 75  MCG tablet Take 1 tablet (75 mcg total) by mouth daily before breakfast. 12/20/16   Johnson, Megan P, DO  naproxen sodium (ANAPROX) 550 MG tablet TAKE 1 TABLET BY MOUTH THREE TIMES A DAY AS NEEDED. CONFIRMED HIGH DOSE WITH DR LUTINS 02/02/17   [provider]  ondansetron (ZOFRAN ODT) 4 MG disintegrating tablet Take 1 tablet (4 mg total) by mouth every 8 (eight) hours as needed for nausea or vomiting. 12/19/16   Park Liter P, DO  predniSONE (DELTASONE) 10 MG  tablet Take 6 tabs day one, 5 tabs day two, 4 tabs day 3, etc 01/05/17   Volney American, PA-C  QUEtiapine (SEROQUEL) 100 MG tablet TAKE 1.5 TABLETS (150 MG TOTAL) BY MOUTH AT BEDTIME. 12/18/16   Johnson, Megan P, DO  simvastatin (ZOCOR) 20 MG tablet TAKE 1 TABLET (20 MG TOTAL) BY MOUTH DAILY. 10/12/16   Valerie Roys, DO    Family History  Problem Relation Age of Onset  . Heart disease Father        CAD  . Heart disease Sister 50       CAD  . Cancer Maternal Grandmother        breast  . Breast cancer Maternal Grandmother 39  . Cancer Paternal Grandmother        breast  . Breast cancer Paternal Grandmother 56  . Alzheimer's disease Maternal Grandfather      Social History  Substance Use Topics  . Smoking status: Former Smoker    Quit date: 04/06/2011  . Smokeless tobacco: Never Used  . Alcohol use Yes     Comment: On Occasion    Allergies as of 04/09/2017 - Review Complete 01/05/2017  Allergen Reaction Noted  . Codeine phosphate [codeine] Other (See Comments) 03/19/2015  . Crestor [rosuvastatin calcium] Other (See Comments) 03/19/2015  . Rosuvastatin calcium Other (See Comments) 03/19/2015  . Sulfa antibiotics Nausea Only 03/19/2015  . Zithromax [azithromycin] Other (See Comments) 03/19/2015    Review of Systems:    All systems reviewed and negative except where noted in HPI.   Physical Exam:  BP 103/68   Pulse 76   Temp 98.7 F (37.1 C) (Oral)   Ht _0  (1.626 m)   Wt 206 lb 9.6 oz (93.7 kg)   BMI 35.46 kg/m  No LMP recorded. Patient is postmenopausal.  General:   Alert,  Well-developed, well-nourished, pleasant and cooperative in NAD Head:  Normocephalic and atraumatic. Eyes:  Sclera clear, no icterus.   Conjunctiva pink. Ears:  Normal auditory acuity. Nose:  No deformity, discharge, or lesions. Mouth:  No deformity or lesions,oropharynx pink & moist. Neck:  Supple; no masses or thyromegaly. Lungs:  Respirations even and unlabored.  Clear throughout  to auscultation.   No wheezes, crackles, or rhonchi. No acute distress. Heart:  Regular rate and rhythm; no murmurs, clicks, rubs, or gallops. Abdomen:  Normal bowel sounds.  No bruits.  Soft, mild lower abdominal discomfort and non-distended without masses, hepatosplenomegaly or hernias noted.  No guarding or rebound tenderness.   Rectal: Nor performed Msk:  Symmetrical without gross deformities. Good, equal movement & strength bilaterally. Pulses:  Normal pulses noted. Extremities:  No clubbing or edema.  No cyanosis. Neurologic:  Alert and oriented x3;  grossly normal neurologically. Skin:  Intact without significant lesions or rashes. No jaundice. Lymph Nodes:  No significant cervical adenopathy. Psych:  Alert and cooperative. Normal mood and affect.  Imaging Studies: No abdominal imaging  Assessment and Plan:   Nastassia  LATAVIA GOGA is a 56 y.o. female with Acute nonbloody diarrhea, stool studies have been negative for infectious etiology, ESR normal. Her diarrhea has spontaneously resolved. She received Augmentin followed by ciprofloxacin in last 4 weeks. She has history of heavy smoking. Differentials include viral enteritis or functional diarrhea or exocrine pancreatic insufficiency secondary to chronic pancreatitis  - Check CT A/P pancreas protocol - Pancreatic fecal elastase - If above tests are negative and diarrhea recurs, I will perform colonoscopy with biopsies   Follow up after above workup or sooner if symptoms recur   Carol Darby, MD

## 2017-04-11 ENCOUNTER — Other Ambulatory Visit
Admission: RE | Admit: 2017-04-11 | Discharge: 2017-04-11 | Disposition: A | Discharge status: Home / Self Care | Payer: 59 | Source: Ambulatory Visit | Attending: Gastroenterology | Admitting: Gastroenterology

## 2017-04-11 DIAGNOSIS — R197 Diarrhea, unspecified: Secondary | ICD-10-CM | POA: Insufficient documentation

## 2017-04-13 ENCOUNTER — Ambulatory Visit: Admission: RE | Admit: 2017-04-13 | Payer: 59 | Source: Ambulatory Visit

## 2017-04-14 LAB — PANCREATIC ELASTASE, FECAL

## 2017-04-16 ENCOUNTER — Other Ambulatory Visit: Payer: Self-pay | Admitting: Family Medicine

## 2017-04-17 ENCOUNTER — Telehealth: Payer: Self-pay

## 2017-04-17 NOTE — Telephone Encounter (Signed)
Patient has been informed  pancreatic insufficiency came back normal. We are waiting on the CT A/P . She said she had to cancel her CT due to the cost to have it done.  She is still having issues - she has only had 3 firm bowel movements since Sept 9th.  Is there anything we can do?  Please advise.  Thanks [employer]

## 2017-04-18 ENCOUNTER — Telehealth: Payer: Self-pay | Admitting: Gastroenterology

## 2017-04-18 ENCOUNTER — Encounter: Payer: Self-pay | Admitting: Gastroenterology

## 2017-04-18 DIAGNOSIS — K591 Functional diarrhea: Secondary | ICD-10-CM

## 2017-04-18 MED ORDER — RIFAXIMIN 550 MG PO TABS: 550.0000 mg | ORAL_TABLET | Freq: Three times a day (TID) | ORAL | 0 refills | Status: AC

## 2017-04-18 NOTE — Telephone Encounter (Signed)
Her diarrhea improved when she saw me, started getting worse again. I recommended to check for celiac disease and empiric trial of Antibiotics as infectious work up came back negative and pancreatic elastase came back normal.  She wants to try antibiotics first before checking celiac serologies. She also had about 10lbs weight loss as she is scared of eating. Prescribed rifaximin for 14days  If diarrhea is persistent, recommend clinic f/u to discuss about EGD and colonoscopy with biopsies  Cephas Darby, MD [place]  [place], [place] [address]  Main: [phone]  Fax: [phone] Pager: [phone]

## 2017-05-15 ENCOUNTER — Ambulatory Visit: Payer: Commercial Managed Care - HMO | Admitting: Gastroenterology

## 2017-05-21 ENCOUNTER — Telehealth: Payer: Self-pay | Admitting: Family Medicine

## 2017-05-21 MED ORDER — HYDROCODONE-ACETAMINOPHEN 10-325 MG PO TABS: ORAL_TABLET | ORAL | 0 refills | Status: DC

## 2017-05-21 NOTE — Telephone Encounter (Signed)
Copied from [facility] [id number]. Topic: Quick Communication - See Telephone Encounter >> May 21, 2017  1:32 PM Burnis Medin, NT wrote: CRM for notification. See Telephone encounter for: Pt is calling in about getting a refill on HYDROcodone-acetaminophen (NORCO) 10-325 MG tablet. Pt. Said her insurance will only pay for 34 day supply. Pt would like to pick the medication today or tomorrow. Pt would like a call back when prescription is ready.  05/21/17.

## 2017-05-21 NOTE — Telephone Encounter (Signed)
Patient notified

## 2017-05-21 NOTE — Telephone Encounter (Signed)
Patient states that she needs a 34 day supply,  insurance will only cover 34 day.

## 2017-05-21 NOTE — Telephone Encounter (Signed)
Also making an appointment with neurosurgeon since she is having more pain than usual

## 2017-05-21 NOTE — Telephone Encounter (Signed)
Needs MD approval. Thanks.

## 2017-05-21 NOTE — Telephone Encounter (Signed)
New Rx for 68 pills written, will void 90 day Rx

## 2017-05-21 NOTE — Telephone Encounter (Signed)
Rx up front for her to pick up, however, as she is needing this more regularly, I would like to see her about every 3 months-- we will discuss this at her appointment in December.

## 2017-06-11 ENCOUNTER — Ambulatory Visit: Payer: 59 | Admitting: Family Medicine

## 2017-06-21 ENCOUNTER — Encounter: Payer: Self-pay | Admitting: Family Medicine

## 2017-06-21 ENCOUNTER — Ambulatory Visit (INDEPENDENT_AMBULATORY_CARE_PROVIDER_SITE_OTHER): Payer: Managed Care, Other (non HMO) | Admitting: Family Medicine

## 2017-06-21 VITALS — BP 133/76 | HR 76 | Temp 98.3°F | Ht 64.6 in | Wt 207.5 lb

## 2017-06-21 DIAGNOSIS — R7303 Prediabetes: Secondary | ICD-10-CM | POA: Diagnosis not present

## 2017-06-21 DIAGNOSIS — Z1159 Encounter for screening for other viral diseases: Secondary | ICD-10-CM

## 2017-06-21 DIAGNOSIS — M4722 Other spondylosis with radiculopathy, cervical region: Secondary | ICD-10-CM

## 2017-06-21 DIAGNOSIS — Z0000 Encounter for general adult medical examination without abnormal findings: Secondary | ICD-10-CM

## 2017-06-21 DIAGNOSIS — E039 Hypothyroidism, unspecified: Secondary | ICD-10-CM

## 2017-06-21 DIAGNOSIS — E782 Mixed hyperlipidemia: Secondary | ICD-10-CM | POA: Diagnosis not present

## 2017-06-21 DIAGNOSIS — Z124 Encounter for screening for malignant neoplasm of cervix: Secondary | ICD-10-CM

## 2017-06-21 DIAGNOSIS — Z1239 Encounter for other screening for malignant neoplasm of breast: Secondary | ICD-10-CM

## 2017-06-21 DIAGNOSIS — F3181 Bipolar II disorder: Secondary | ICD-10-CM

## 2017-06-21 MED ORDER — GABAPENTIN 600 MG PO TABS: 600.0000 mg | ORAL_TABLET | Freq: Every day | ORAL | 1 refills | Status: DC

## 2017-06-21 MED ORDER — ONDANSETRON 4 MG PO TBDP: 4.0000 mg | ORAL_TABLET | Freq: Three times a day (TID) | ORAL | 0 refills | Status: DC | PRN

## 2017-06-21 MED ORDER — QUETIAPINE FUMARATE 100 MG PO TABS: 150.0000 mg | ORAL_TABLET | Freq: Every day | ORAL | 1 refills | Status: DC

## 2017-06-21 MED ORDER — SIMVASTATIN 20 MG PO TABS: 20.0000 mg | ORAL_TABLET | Freq: Every day | ORAL | 1 refills | Status: DC

## 2017-06-21 MED ORDER — FLUOXETINE HCL 20 MG PO TABS: 20.0000 mg | ORAL_TABLET | Freq: Every day | ORAL | 1 refills | Status: DC

## 2017-06-21 MED ORDER — OMEPRAZOLE 20 MG PO CPDR: 20.0000 mg | DELAYED_RELEASE_CAPSULE | Freq: Every day | ORAL | 3 refills | Status: DC

## 2017-06-21 NOTE — Patient Instructions (Addendum)
Health Maintenance, Female Adopting a healthy lifestyle and getting preventive care can go a long way to promote health and wellness. Talk with your health care provider about what schedule of regular examinations is right for you. This is a good chance for you to check in with your provider about disease prevention and staying healthy. In between checkups, there are plenty of things you can do on your own. Experts have done a lot of research about which lifestyle changes and preventive measures are most likely to keep you healthy. Ask your health care provider for more information. Weight and diet Eat a healthy diet  Be sure to include plenty of vegetables, fruits, low-fat dairy products, and lean protein.  Do not eat a lot of foods high in solid fats, added sugars, or salt.  Get regular exercise. This is one of the most important things you can do for your health. ? Most adults should exercise for at least 150 minutes each week. The exercise should increase your heart rate and make you sweat (moderate-intensity exercise). ? Most adults should also do strengthening exercises at least twice a week. This is in addition to the moderate-intensity exercise.  Maintain a healthy weight  Body mass index (BMI) is a measurement that can be used to identify possible weight problems. It estimates body fat based on height and weight. Your health care provider can help determine your BMI and help you achieve or maintain a healthy weight.  For females 20 years of age and older: ? A BMI below 18.5 is considered underweight. ? A BMI of 18.5 to 24.9 is normal. ? A BMI of 25 to 29.9 is considered overweight. ? A BMI of 30 and above is considered obese.  Watch levels of cholesterol and blood lipids  You should start having your blood tested for lipids and cholesterol at 55 years of age, then have this test every 5 years.  You may need to have your cholesterol levels checked more often if: ? Your lipid or  cholesterol levels are high. ? You are older than 55 years of age. ? You are at high risk for heart disease.  Cancer screening Lung Cancer  Lung cancer screening is recommended for adults 55-80 years old who are at high risk for lung cancer because of a history of smoking.  A yearly low-dose CT scan of the lungs is recommended for people who: ? Currently smoke. ? Have quit within the past 15 years. ? Have at least a 30-pack-year history of smoking. A pack year is smoking an average of one pack of cigarettes a day for 1 year.  Yearly screening should continue until it has been 15 years since you quit.  Yearly screening should stop if you develop a health problem that would prevent you from having lung cancer treatment.  Breast Cancer  Practice breast self-awareness. This means understanding how your breasts normally appear and feel.  It also means doing regular breast self-exams. Let your health care provider know about any changes, no matter how small.  If you are in your 20s or 30s, you should have a clinical breast exam (CBE) by a health care provider every 1-3 years as part of a regular health exam.  If you are 40 or older, have a CBE every year. Also consider having a breast X-ray (mammogram) every year.  If you have a family history of breast cancer, talk to your health care provider about genetic screening.  If you are at high risk   for breast cancer, talk to your health care provider about having an MRI and a mammogram every year.  Breast cancer gene (BRCA) assessment is recommended for women who have family members with BRCA-related cancers. BRCA-related cancers include: ? Breast. ? Ovarian. ? Tubal. ? Peritoneal cancers.  Results of the assessment will determine the need for genetic counseling and BRCA1 and BRCA2 testing.  Cervical Cancer Your health care provider may recommend that you be screened regularly for cancer of the pelvic organs (ovaries, uterus, and  vagina). This screening involves a pelvic examination, including checking for microscopic changes to the surface of your cervix (Pap test). You may be encouraged to have this screening done every 3 years, beginning at age 22.  For women ages 56-65, health care providers may recommend pelvic exams and Pap testing every 3 years, or they may recommend the Pap and pelvic exam, combined with testing for human papilloma virus (HPV), every 5 years. Some types of HPV increase your risk of cervical cancer. Testing for HPV may also be done on women of any age with unclear Pap test results.  Other health care providers may not recommend any screening for nonpregnant women who are considered low risk for pelvic cancer and who do not have symptoms. Ask your health care provider if a screening pelvic exam is right for you.  If you have had past treatment for cervical cancer or a condition that could lead to cancer, you need Pap tests and screening for cancer for at least 20 years after your treatment. If Pap tests have been discontinued, your risk factors (such as having a new sexual partner) need to be reassessed to determine if screening should resume. Some women have medical problems that increase the chance of getting cervical cancer. In these cases, your health care provider may recommend more frequent screening and Pap tests.  Colorectal Cancer  This type of cancer can be detected and often prevented.  Routine colorectal cancer screening usually begins at 54 years of age and continues through 55 years of age.  Your health care provider may recommend screening at an earlier age if you have risk factors for colon cancer.  Your health care provider may also recommend using home test kits to check for hidden blood in the stool.  A small camera at the end of a tube can be used to examine your colon directly (sigmoidoscopy or colonoscopy). This is done to check for the earliest forms of colorectal  cancer.  Routine screening usually begins at age 33.  Direct examination of the colon should be repeated every 5-10 years through 55 years of age. However, you may need to be screened more often if early forms of precancerous polyps or small growths are found.  Skin Cancer  Check your skin from head to toe regularly.  Tell your health care provider about any new moles or changes in moles, especially if there is a change in a mole's shape or color.  Also tell your health care provider if you have a mole that is larger than the size of a pencil eraser.  Always use sunscreen. Apply sunscreen liberally and repeatedly throughout the day.  Protect yourself by wearing long sleeves, pants, a wide-brimmed hat, and sunglasses whenever you are outside.  Heart disease, diabetes, and high blood pressure  High blood pressure causes heart disease and increases the risk of stroke. High blood pressure is more likely to develop in: ? People who have blood pressure in the high end of  the normal range (130-139/85-89 mm Hg). ? People who are overweight or obese. ? People who are African American.  If you are 21-29 years of age, have your blood pressure checked every 3-5 years. If you are 3 years of age or older, have your blood pressure checked every year. You should have your blood pressure measured twice-once when you are at a hospital or clinic, and once when you are not at a hospital or clinic. Record the average of the two measurements. To check your blood pressure when you are not at a hospital or clinic, you can use: ? An automated blood pressure machine at a pharmacy. ? A home blood pressure monitor.  If you are between 17 years and 37 years old, ask your health care provider if you should take aspirin to prevent strokes.  Have regular diabetes screenings. This involves taking a blood sample to check your fasting blood sugar level. ? If you are at a normal weight and have a low risk for diabetes,  have this test once every three years after 55 years of age. ? If you are overweight and have a high risk for diabetes, consider being tested at a younger age or more often. Preventing infection Hepatitis B  If you have a higher risk for hepatitis B, you should be screened for this virus. You are considered at high risk for hepatitis B if: ? You were born in a country where hepatitis B is common. Ask your health care provider which countries are considered high risk. ? Your parents were born in a high-risk country, and you have not been immunized against hepatitis B (hepatitis B vaccine). ? You have HIV or AIDS. ? You use needles to inject street drugs. ? You live with someone who has hepatitis B. ? You have had sex with someone who has hepatitis B. ? You get hemodialysis treatment. ? You take certain medicines for conditions, including cancer, organ transplantation, and autoimmune conditions.  Hepatitis C  Blood testing is recommended for: ? Everyone born from [birth year] through 1964. ? Anyone with known risk factors for hepatitis C.  Sexually transmitted infections (STIs)  You should be screened for sexually transmitted infections (STIs) including gonorrhea and chlamydia if: ? You are sexually active and are younger than 55 years of age. ? You are older than 55 years of age and your health care provider tells you that you are at risk for this type of infection. ? Your sexual activity has changed since you were last screened and you are at an increased risk for chlamydia or gonorrhea. Ask your health care provider if you are at risk.  If you do not have HIV, but are at risk, it may be recommended that you take a prescription medicine daily to prevent HIV infection. This is called pre-exposure prophylaxis (PrEP). You are considered at risk if: ? You are sexually active and do not regularly use condoms or know the HIV status of your partner(s). ? You take drugs by injection. ? You are  sexually active with a partner who has HIV.  Talk with your health care provider about whether you are at high risk of being infected with HIV. If you choose to begin PrEP, you should first be tested for HIV. You should then be tested every 3 months for as long as you are taking PrEP. Pregnancy  If you are premenopausal and you may become pregnant, ask your health care provider about preconception counseling.  If you may become  pregnant, take 400 to 800 micrograms (mcg) of folic acid every day.  If you want to prevent pregnancy, talk to your health care provider about birth control (contraception). Osteoporosis and menopause  Osteoporosis is a disease in which the bones lose minerals and strength with aging. This can result in serious bone fractures. Your risk for osteoporosis can be identified using a bone density scan.  If you are 28 years of age or older, or if you are at risk for osteoporosis and fractures, ask your health care provider if you should be screened.  Ask your health care provider whether you should take a calcium or vitamin D supplement to lower your risk for osteoporosis.  Menopause may have certain physical symptoms and risks.  Hormone replacement therapy may reduce some of these symptoms and risks. Talk to your health care provider about whether hormone replacement therapy is right for you. Follow these instructions at home:  Schedule regular health, dental, and eye exams.  Stay current with your immunizations.  Do not use any tobacco products including cigarettes, chewing tobacco, or electronic cigarettes.  If you are pregnant, do not drink alcohol.  If you are breastfeeding, limit how much and how often you drink alcohol.  Limit alcohol intake to no more than 1 drink per day for nonpregnant women. One drink equals 12 ounces of beer, 5 ounces of wine, or 1 ounces of hard liquor.  Do not use street drugs.  Do not share needles.  Ask your health care  provider for help if you need support or information about quitting drugs.  Tell your health care provider if you often feel depressed.  Tell your health care provider if you have ever been abused or do not feel safe at home. This information is not intended to replace advice given to you by your health care provider. Make sure you discuss any questions you have with your health care provider. Document Released: 01/09/2011 Document Revised: 12/02/2015 Document Reviewed: 03/30/2015 Elsevier Interactive Patient Education  2018 [place] Maintenance for Postmenopausal Women Menopause is a normal process in which your reproductive ability comes to an end. This process happens gradually over a span of months to years, usually between the ages of 96 and 42. Menopause is complete when you have missed 12 consecutive menstrual periods. It is important to talk with your health care provider about some of the most common conditions that affect postmenopausal women, such as heart disease, cancer, and bone loss (osteoporosis). Adopting a healthy lifestyle and getting preventive care can help to promote your health and wellness. Those actions can also lower your chances of developing some of these common conditions. What should I know about menopause? During menopause, you may experience a number of symptoms, such as:  Moderate-to-severe hot flashes.  Night sweats.  Decrease in sex drive.  Mood swings.  Headaches.  Tiredness.  Irritability.  Memory problems.  Insomnia.  Choosing to treat or not to treat menopausal changes is an individual decision that you make with your health care provider. What should I know about hormone replacement therapy and supplements? Hormone therapy products are effective for treating symptoms that are associated with menopause, such as hot flashes and night sweats. Hormone replacement carries certain risks, especially as you become older. If you are  thinking about using estrogen or estrogen with progestin treatments, discuss the benefits and risks with your health care provider. What should I know about heart disease and stroke? Heart disease, heart attack, and stroke  become more likely as you age. This may be due, in part, to the hormonal changes that your body experiences during menopause. These can affect how your body processes dietary fats, triglycerides, and cholesterol. Heart attack and stroke are both medical emergencies. There are many things that you can do to help prevent heart disease and stroke:  Have your blood pressure checked at least every 1-2 years. High blood pressure causes heart disease and increases the risk of stroke.  If you are 55-79 years old, ask your health care provider if you should take aspirin to prevent a heart attack or a stroke.  Do not use any tobacco products, including cigarettes, chewing tobacco, or electronic cigarettes. If you need help quitting, ask your health care provider.  It is important to eat a healthy diet and maintain a healthy weight. ? Be sure to include plenty of vegetables, fruits, low-fat dairy products, and lean protein. ? Avoid eating foods that are high in solid fats, added sugars, or salt (sodium).  Get regular exercise. This is one of the most important things that you can do for your health. ? Try to exercise for at least 150 minutes each week. The type of exercise that you do should increase your heart rate and make you sweat. This is known as moderate-intensity exercise. ? Try to do strengthening exercises at least twice each week. Do these in addition to the moderate-intensity exercise.  Know your numbers.Ask your health care provider to check your cholesterol and your blood glucose. Continue to have your blood tested as directed by your health care provider.  What should I know about cancer screening? There are several types of cancer. Take the following steps to reduce  your risk and to catch any cancer development as early as possible. Breast Cancer  Practice breast self-awareness. ? This means understanding how your breasts normally appear and feel. ? It also means doing regular breast self-exams. Let your health care provider know about any changes, no matter how small.  If you are 40 or older, have a clinician do a breast exam (clinical breast exam or CBE) every year. Depending on your age, family history, and medical history, it may be recommended that you also have a yearly breast X-ray (mammogram).  If you have a family history of breast cancer, talk with your health care provider about genetic screening.  If you are at high risk for breast cancer, talk with your health care provider about having an MRI and a mammogram every year.  Breast cancer (BRCA) gene test is recommended for women who have family members with BRCA-related cancers. Results of the assessment will determine the need for genetic counseling and BRCA1 and for BRCA2 testing. BRCA-related cancers include these types: ? Breast. This occurs in males or females. ? Ovarian. ? Tubal. This may also be called fallopian tube cancer. ? Cancer of the abdominal or pelvic lining (peritoneal cancer). ? Prostate. ? Pancreatic.  Cervical, Uterine, and Ovarian Cancer Your health care provider may recommend that you be screened regularly for cancer of the pelvic organs. These include your ovaries, uterus, and vagina. This screening involves a pelvic exam, which includes checking for microscopic changes to the surface of your cervix (Pap test).  For women ages 21-65, health care providers may recommend a pelvic exam and a Pap test every three years. For women ages 30-65, they may recommend the Pap test and pelvic exam, combined with testing for human papilloma virus (HPV), every five years. Some types   of HPV increase your risk of cervical cancer. Testing for HPV may also be done on women of any age who  have unclear Pap test results.  Other health care providers may not recommend any screening for nonpregnant women who are considered low risk for pelvic cancer and have no symptoms. Ask your health care provider if a screening pelvic exam is right for you.  If you have had past treatment for cervical cancer or a condition that could lead to cancer, you need Pap tests and screening for cancer for at least 20 years after your treatment. If Pap tests have been discontinued for you, your risk factors (such as having a new sexual partner) need to be reassessed to determine if you should start having screenings again. Some women have medical problems that increase the chance of getting cervical cancer. In these cases, your health care provider may recommend that you have screening and Pap tests more often.  If you have a family history of uterine cancer or ovarian cancer, talk with your health care provider about genetic screening.  If you have vaginal bleeding after reaching menopause, tell your health care provider.  There are currently no reliable tests available to screen for ovarian cancer.  Lung Cancer Lung cancer screening is recommended for adults 20-34 years old who are at high risk for lung cancer because of a history of smoking. A yearly low-dose CT scan of the lungs is recommended if you:  Currently smoke.  Have a history of at least 30 pack-years of smoking and you currently smoke or have quit within the past 15 years. A pack-year is smoking an average of one pack of cigarettes per day for one year.  Yearly screening should:  Continue until it has been 15 years since you quit.  Stop if you develop a health problem that would prevent you from having lung cancer treatment.  Colorectal Cancer  This type of cancer can be detected and can often be prevented.  Routine colorectal cancer screening usually begins at age 91 and continues through age 40.  If you have risk factors for colon  cancer, your health care provider may recommend that you be screened at an earlier age.  If you have a family history of colorectal cancer, talk with your health care provider about genetic screening.  Your health care provider may also recommend using home test kits to check for hidden blood in your stool.  A small camera at the end of a tube can be used to examine your colon directly (sigmoidoscopy or colonoscopy). This is done to check for the earliest forms of colorectal cancer.  Direct examination of the colon should be repeated every 5-10 years until age 59. However, if early forms of precancerous polyps or small growths are found or if you have a family history or genetic risk for colorectal cancer, you may need to be screened more often.  Skin Cancer  Check your skin from head to toe regularly.  Monitor any moles. Be sure to tell your health care provider: ? About any new moles or changes in moles, especially if there is a change in a mole's shape or color. ? If you have a mole that is larger than the size of a pencil eraser.  If any of your family members has a history of skin cancer, especially at a young age, talk with your health care provider about genetic screening.  Always use sunscreen. Apply sunscreen liberally and repeatedly throughout the day.  Whenever you are outside, protect yourself by wearing long sleeves, pants, a wide-brimmed hat, and sunglasses.  What should I know about osteoporosis? Osteoporosis is a condition in which bone destruction happens more quickly than new bone creation. After menopause, you may be at an increased risk for osteoporosis. To help prevent osteoporosis or the bone fractures that can happen because of osteoporosis, the following is recommended:  If you are 13-49 years old, get at least 1,000 mg of calcium and at least 600 mg of vitamin D per day.  If you are older than age 83 but younger than age 38, get at least 1,200 mg of calcium and  at least 600 mg of vitamin D per day.  If you are older than age 77, get at least 1,200 mg of calcium and at least 800 mg of vitamin D per day.  Smoking and excessive alcohol intake increase the risk of osteoporosis. Eat foods that are rich in calcium and vitamin D, and do weight-bearing exercises several times each week as directed by your health care provider. What should I know about how menopause affects my mental health? Depression may occur at any age, but it is more common as you become older. Common symptoms of depression include:  Low or sad mood.  Changes in sleep patterns.  Changes in appetite or eating patterns.  Feeling an overall lack of motivation or enjoyment of activities that you previously enjoyed.  Frequent crying spells.  Talk with your health care provider if you think that you are experiencing depression. What should I know about immunizations? It is important that you get and maintain your immunizations. These include:  Tetanus, diphtheria, and pertussis (Tdap) booster vaccine.  Influenza every year before the flu season begins.  Pneumonia vaccine.  Shingles vaccine.  Your health care provider may also recommend other immunizations. This information is not intended to replace advice given to you by your health care provider. Make sure you discuss any questions you have with your health care provider. Document Released: 08/18/2005 Document Revised: 01/14/2016 Document Reviewed: 03/30/2015 Elsevier Interactive Patient Education  2018 [employer].

## 2017-06-21 NOTE — Assessment & Plan Note (Signed)
Under good control. Does not need refill today. Will call when she does. We expect a PA on her medicine. Call with any concerns.

## 2017-06-21 NOTE — Progress Notes (Signed)
BP 133/76 (BP Location: Left Arm, Patient Position: Sitting, Cuff Size: Normal)   Pulse 76   Temp 98.3 F (36.8 C)   Ht 5' 4.6" (1.641 m)   Wt 207 lb 8 oz (94.1 kg)   SpO2 96%   BMI 34.96 kg/m    Subjective:    Patient ID: Carol Becker, female    DOB: [birth year], 56 y.o.   MRN: [id number]  HPI: Carol Becker is a 56 y.o. female presenting on 06/21/2017 for comprehensive medical examination. Current medical complaints include:  CHRONIC PAIN  Present dose:  Morphine equivalents Pain control status: controlled Duration: chronic Location: low back Quality: dull and aching Current Pain Level: moderate Previous Pain Level: severe Breakthrough pain: no Benefit from narcotic medications: yes What Activities task can be accomplished with current medication? Able to do her job and work Interested in Careers adviser off narcotics:no   Stool softners/OTC fiber: no  Previous pain specialty evaluation: no Non-narcotic analgesic meds: yes Narcotic contract: yes  HYPOTHYROIDISM Thyroid control status:controlled Satisfied with current treatment? yes Medication side effects: no Medication compliance: excellent compliance Etiology of hypothyroidism:  Recent dose adjustment:no Fatigue: no Cold intolerance: no Heat intolerance: no Weight gain: no Weight loss: no Constipation: no Diarrhea/loose stools: no Palpitations: no Lower extremity edema: no Anxiety/depressed mood: no  Impaired Fasting Glucose HbA1C: No results found for: HGBA1C Duration of elevated blood sugar:  Polydipsia: no Polyuria: no Weight change: no Visual disturbance: no Glucose Monitoring: no  Diabetic Education: Not Completed Family history of diabetes: yes  HYPERLIPIDEMIA Hyperlipidemia status: excellent compliance Satisfied with current treatment?  yes Side effects:  no Medication compliance: excellent compliance Past cholesterol meds: simvastatin Supplements: none Aspirin:  no The 10-year ASCVD  risk score Mikey Bussing DC Jr., et al., 2013) is: 2%   Values used to calculate the score:     Age: 47 years     Sex: Female     Is Non-Hispanic African American: No     Diabetic: No     Tobacco smoker: No     Systolic Blood Pressure: 182 mmHg     Is BP treated: No     HDL Cholesterol: 42 mg/dL     Total Cholesterol: 144 mg/dL Chest pain:  no Coronary artery disease:  no Family history CAD:  yes  BIPOLAR Mood status: controlled Satisfied with current treatment?: yes Symptom severity: mild  Duration of current treatment : chronic Side effects: no Medication compliance: excellent compliance Psychotherapy/counseling: no  Previous psychiatric medications: seroquel and prozac Depressed mood: no Anxious mood: no Anhedonia: no Significant weight loss or gain: no Insomnia: no  Fatigue: yes Feelings of worthlessness or guilt: no Impaired concentration/indecisiveness: no Suicidal ideations: no Hopelessness: no Crying spells: no Depression screen [facility] 2/9 06/21/2017 12/19/2016 06/13/2016 04/20/2015  Decreased Interest 0 0 0 1  Down, Depressed, Hopeless 0 1 0 1  PHQ - 2 Score 0 1 0 2    She currently lives with: husband Menopausal Symptoms: no  Depression Screen done today and results listed below:  Depression screen [facility] 2/9 06/21/2017 12/19/2016 06/13/2016 04/20/2015  Decreased Interest 0 0 0 1  Down, Depressed, Hopeless 0 1 0 1  PHQ - 2 Score 0 1 0 2     Past Medical History:  Past Medical History:  Diagnosis Date  . Bipolar 1 disorder ([place])   . Cervical spinal stenosis   . GERD (gastroesophageal reflux disease)   . Hyperlipidemia   . Osteopenia   . Sleep apnea   .  Thyroid disease     Surgical History:  Past Surgical History:  Procedure Laterality Date  . ANTERIOR / POSTERIOR COMBINED FUSION CERVICAL SPINE    . KNEE ARTHROSCOPY WITH EXCISION BAKER'S CYST    . TUBAL LIGATION      Medications:  Current Outpatient Medications on File Prior to Visit  Medication Sig  .  chlorhexidine (PERIDEX) 0.12 % solution RINSE WITH 1/2 OZ FOR 30 SECONDS AFTER THROUGH BRUSHING THREE TIMES DAILY  . HYDROcodone-acetaminophen (NORCO) 10-325 MG tablet TAKE 1/2-1 TABLET BY ORAL ROUTE EVERY 12 HOURS AS NEEDED FOR PAIN  . levothyroxine (SYNTHROID, LEVOTHROID) 75 MCG tablet Take 1 tablet (75 mcg total) by mouth daily before breakfast.   No current facility-administered medications on file prior to visit.     Allergies:  Allergies  Allergen Reactions  . Codeine Phosphate [Codeine] Other (See Comments)    Jittery  . Crestor [Rosuvastatin Calcium] Other (See Comments)    Indigestion  . Rosuvastatin Calcium Other (See Comments)    Indigestion  . Sulfa Antibiotics Nausea Only  . Zithromax [Azithromycin] Other (See Comments)    Not effective    Social History:  Social History   Socioeconomic History  . Marital status: Married    Spouse name: Not on file  . Number of children: Not on file  . Years of education: Not on file  . Highest education level: Not on file  Social Needs  . Financial resource strain: Not on file  . Food insecurity - worry: Not on file  . Food insecurity - inability: Not on file  . Transportation needs - medical: Not on file  . Transportation needs - non-medical: Not on file  Occupational History  . Not on file  Tobacco Use  . Smoking status: Former Smoker    Last attempt to quit: 04/06/2011    Years since quitting: 6.2  . Smokeless tobacco: Never Used  Substance and Sexual Activity  . Alcohol use: Yes    Comment: On Occasion  . Drug use: No  . Sexual activity: Yes    Birth control/protection: Post-menopausal  Other Topics Concern  . Not on file  Social History Narrative  . Not on file   Social History   Tobacco Use  Smoking Status Former Smoker  . Last attempt to quit: 04/06/2011  . Years since quitting: 6.2  Smokeless Tobacco Never Used   Social History   Substance and Sexual Activity  Alcohol Use Yes   Comment: On Occasion     Family History:  Family History  Problem Relation Age of Onset  . Heart disease Father        CAD  . Heart disease Sister 19       CAD  . Cancer Maternal Grandmother        breast  . Breast cancer Maternal Grandmother 56  . Cancer Paternal Grandmother        breast  . Breast cancer Paternal Grandmother 56  . Alzheimer's disease Maternal Grandfather     Past medical history, surgical history, medications, allergies, family history and social history reviewed with patient today and changes made to appropriate areas of the chart.   Review of Systems  Constitutional: Positive for malaise/fatigue. Negative for chills, diaphoresis, fever and weight loss.  HENT: Negative.   Eyes: Negative.   Respiratory: Negative.   Cardiovascular: Negative.   Gastrointestinal: Positive for heartburn. Negative for abdominal pain, blood in stool, constipation, diarrhea, melena, nausea and vomiting.  Genitourinary: Negative.  Musculoskeletal: Positive for back pain, myalgias and neck pain. Negative for falls and joint pain.  Skin: Negative.   Neurological: Negative.  Negative for weakness.  Endo/Heme/Allergies: Negative.   Psychiatric/Behavioral: Negative.     All other ROS negative except what is listed above and in the HPI.      Objective:    BP 133/76 (BP Location: Left Arm, Patient Position: Sitting, Cuff Size: Normal)   Pulse 76   Temp 98.3 F (36.8 C)   Ht 5' 4.6" (1.641 m)   Wt 207 lb 8 oz (94.1 kg)   SpO2 96%   BMI 34.96 kg/m   Wt Readings from Last 3 Encounters:  06/21/17 207 lb 8 oz (94.1 kg)  04/09/17 206 lb 9.6 oz (93.7 kg)  01/05/17 205 lb (93 kg)    Physical Exam  Constitutional: She is oriented to person, place, and time. She appears well-developed and well-nourished. No distress.  HENT:  Head: Normocephalic and atraumatic.  Right Ear: Hearing, tympanic membrane, external ear and ear canal normal. No decreased hearing is noted.  Left Ear: Hearing, tympanic  membrane, external ear and ear canal normal.  Nose: Nose normal.  Mouth/Throat: Uvula is midline, oropharynx is clear and moist and mucous membranes are normal. No oropharyngeal exudate.  Eyes: Conjunctivae, EOM and lids are normal. Pupils are equal, round, and reactive to light. Right eye exhibits no discharge. Left eye exhibits no discharge. No scleral icterus.  Neck: Normal range of motion. Neck supple. No JVD present. No tracheal deviation present. No thyromegaly present.  Cardiovascular: Normal rate, regular rhythm, normal heart sounds and intact distal pulses. Exam reveals no gallop and no friction rub.  No murmur heard. Pulmonary/Chest: Effort normal and breath sounds normal. No stridor. No respiratory distress. She has no wheezes. She has no rales. She exhibits no tenderness. Right breast exhibits no inverted nipple, no mass, no nipple discharge, no skin change and no tenderness. Left breast exhibits no inverted nipple, no mass, no nipple discharge, no skin change and no tenderness. Breasts are symmetrical.  Abdominal: Soft. Bowel sounds are normal. She exhibits no distension and no mass. There is no tenderness. There is no rebound and no guarding. Hernia confirmed negative in the right inguinal area and confirmed negative in the left inguinal area.  Genitourinary: Vagina normal and uterus normal. No labial fusion. There is no rash, tenderness, lesion or injury on the right labia. There is no rash, tenderness, lesion or injury on the left labia. Uterus is not deviated, not enlarged, not fixed and not tender. Cervix exhibits no motion tenderness, no discharge and no friability. Right adnexum displays no mass, no tenderness and no fullness. Left adnexum displays no mass, no tenderness and no fullness. No erythema, tenderness or bleeding in the vagina. No foreign body in the vagina. No signs of injury around the vagina. No vaginal discharge found.  Musculoskeletal: Normal range of motion. She exhibits  no edema, tenderness or deformity.  Lymphadenopathy:    She has no cervical adenopathy.       Right: No inguinal adenopathy present.       Left: No inguinal adenopathy present.  Neurological: She is alert and oriented to person, place, and time. She has normal reflexes. She displays normal reflexes. No cranial nerve deficit. She exhibits normal muscle tone. Coordination normal.  Skin: Skin is warm, dry and intact. No rash noted. She is not diaphoretic. No erythema. No pallor.  Psychiatric: She has a normal mood and affect. Her speech is  normal and behavior is normal. Judgment and thought content normal. Cognition and memory are normal.  Nursing note and vitals reviewed.   Results for orders placed or performed during the hospital encounter of 04/11/17  Pancreatic elastase, fecal  Result Value Ref Range   Pancreatic Elastase-1, Stool >500 >200 ug Elast./g      Assessment & Plan:   Problem List Items Addressed This Visit      Endocrine   Thyroid activity decreased    Under good control. Rechecking levels today and will adjust dose as needed. Call with any concerns.       Relevant Orders   Comprehensive metabolic panel   TSH   UA/M w/rflx Culture, Routine     Musculoskeletal and Integument   DJD (degenerative joint disease), cervical    Under good control. Does not need refill today. Will call when she does. We expect a PA on her medicine. Call with any concerns.         Other   Hyperlipidemia    Under good control. Rechecking levels today and will adjust dose as needed. Call with any concerns.       Relevant Medications   simvastatin (ZOCOR) 20 MG tablet   Other Relevant Orders   Comprehensive metabolic panel   Lipid Panel w/o Chol/HDL Ratio   UA/M w/rflx Culture, Routine   Bipolar 2 disorder (HCC)    Under good control. Continue current regimen. Continue to monitor. Call with any concerns.       Prediabetes    Under good control. Rechecking levels today and will  adjust dose as needed. Call with any concerns.       Relevant Orders   Bayer DCA Hb A1c Waived   Comprehensive metabolic panel   Microalbumin, Urine Waived   UA/M w/rflx Culture, Routine    Other Visit Diagnoses    Routine general medical examination at a health care facility    -  Primary   Vaccines up to date. Screening labs checked today. Pap done. Mammo next year. Colonoscopy up to date. Call with any concerns.    Relevant Orders   Bayer DCA Hb A1c Waived   CBC with Differential/Platelet   Comprehensive metabolic panel   Lipid Panel w/o Chol/HDL Ratio   Microalbumin, Urine Waived   TSH   UA/M w/rflx Culture, Routine   Need for hepatitis C screening test       Labs drawn today. Await results.    Relevant Orders   Hepatitis C Antibody   Screening for breast cancer       Would like to go every other year. Mammogram to be done next year.    Screening for cervical cancer       Pap done today.   Relevant Orders   IGP, Aptima HPV, rfx 16/18,45       Follow up plan: Return in about 6 months (around 12/20/2017) for follow up mood/cholesterol/sugars and pain.   LABORATORY TESTING:  - Pap smear: pap done  IMMUNIZATIONS:   - Tdap: Tetanus vaccination status reviewed: last tetanus booster within 10 years. - Influenza: Refused - Pneumovax: Not applicable  SCREENING: -Mammogram: Ordered today  - Colonoscopy: Up to date  - Bone Density: Not applicable   PATIENT COUNSELING:   Advised to take 1 mg of folate supplement per day if capable of pregnancy.   Sexuality: Discussed sexually transmitted diseases, partner selection, use of condoms, avoidance of unintended pregnancy  and contraceptive alternatives.   Advised to avoid cigarette  smoking.  I discussed with the patient that most people either abstain from alcohol or drink within safe limits (<=14/week and <=4 drinks/occasion for males, <=7/weeks and <= 3 drinks/occasion for females) and that the risk for alcohol disorders  and other health effects rises proportionally with the number of drinks per week and how often a drinker exceeds daily limits.  Discussed cessation/primary prevention of drug use and availability of treatment for abuse.   Diet: Encouraged to adjust caloric intake to maintain  or achieve ideal body weight, to reduce intake of dietary saturated fat and total fat, to limit sodium intake by avoiding high sodium foods and not adding table salt, and to maintain adequate dietary potassium and calcium preferably from fresh fruits, vegetables, and low-fat dairy products.    stressed the importance of regular exercise  Injury prevention: Discussed safety belts, safety helmets, smoke detector, smoking near bedding or upholstery.   Dental health: Discussed importance of regular tooth brushing, flossing, and dental visits.    NEXT PREVENTATIVE PHYSICAL DUE IN 1 YEAR. Return in about 6 months (around 12/20/2017) for follow up mood/cholesterol/sugars and pain.

## 2017-06-21 NOTE — Assessment & Plan Note (Signed)
Under good control. Rechecking levels today and will adjust dose as needed. Call with any concerns.

## 2017-06-21 NOTE — Assessment & Plan Note (Signed)
Under good control. Continue current regimen. Continue to monitor. Call with any concerns. 

## 2017-06-22 ENCOUNTER — Encounter: Payer: Self-pay | Admitting: Family Medicine

## 2017-06-22 LAB — LIPID PANEL W/O CHOL/HDL RATIO
CHOLESTEROL TOTAL: 182 mg/dL (ref 100–199)
HDL: 58 mg/dL (ref >39)
LDL Calculated: 107 mg/dL — ABNORMAL HIGH (ref 0–99)
TRIGLYCERIDES: 87 mg/dL (ref 0–149)
VLDL CHOLESTEROL CAL: 17 mg/dL (ref 5–40)

## 2017-06-22 LAB — CBC WITH DIFFERENTIAL/PLATELET
BASOS ABS: 0 10*3/uL (ref 0.0–0.2)
BASOS: 1 %
EOS (ABSOLUTE): 0.2 10*3/uL (ref 0.0–0.4)
Eos: 2 %
Hematocrit: 40.1 % (ref 34.0–46.6)
Hemoglobin: 13.6 g/dL (ref 11.1–15.9)
IMMATURE GRANS (ABS): 0 10*3/uL (ref 0.0–0.1)
Immature Granulocytes: 0 %
LYMPHS: 35 %
Lymphocytes Absolute: 2.5 10*3/uL (ref 0.7–3.1)
MCH: 31.5 pg (ref 26.6–33.0)
MCHC: 33.9 g/dL (ref 31.5–35.7)
MCV: 93 fL (ref 79–97)
MONOS ABS: 0.6 10*3/uL (ref 0.1–0.9)
Monocytes: 9 %
NEUTROS ABS: 3.9 10*3/uL (ref 1.4–7.0)
Neutrophils: 53 %
PLATELETS: 265 10*3/uL (ref 150–379)
RBC: 4.32 x10E6/uL (ref 3.77–5.28)
RDW: 13.3 % (ref 12.3–15.4)
WBC: 7.2 10*3/uL (ref 3.4–10.8)

## 2017-06-22 LAB — COMPREHENSIVE METABOLIC PANEL
A/G RATIO: 1.4 (ref 1.2–2.2)
ALK PHOS: 81 IU/L (ref 39–117)
ALT: 17 IU/L (ref 0–32)
AST: 21 IU/L (ref 0–40)
Albumin: 4.4 g/dL (ref 3.5–5.5)
BILIRUBIN TOTAL: 0.3 mg/dL (ref 0.0–1.2)
BUN/Creatinine Ratio: 19 (ref 9–23)
BUN: 15 mg/dL (ref 6–24)
CHLORIDE: 104 mmol/L (ref 96–106)
CO2: 27 mmol/L (ref 20–29)
Calcium: 9.5 mg/dL (ref 8.7–10.2)
Creatinine, Ser: 0.81 mg/dL (ref 0.57–1.00)
GFR calc Af Amer: 95 mL/min/{1.73_m2} (ref >59)
GFR calc non Af Amer: 82 mL/min/{1.73_m2} (ref >59)
GLUCOSE: 75 mg/dL (ref 65–99)
Globulin, Total: 3.1 g/dL (ref 1.5–4.5)
POTASSIUM: 3.7 mmol/L (ref 3.5–5.2)
Sodium: 142 mmol/L (ref 134–144)
TOTAL PROTEIN: 7.5 g/dL (ref 6.0–8.5)

## 2017-06-22 LAB — HIV ANTIBODY (ROUTINE TESTING W REFLEX): HIV SCREEN 4TH GENERATION: NONREACTIVE

## 2017-06-22 LAB — TSH: TSH: 0.939 u[IU]/mL (ref 0.450–4.500)

## 2017-06-22 LAB — HEPATITIS C ANTIBODY: Hep C Virus Ab: <0.1 s/co ratio (ref 0.0–0.9)

## 2017-06-23 LAB — UA/M W/RFLX CULTURE, ROUTINE
BILIRUBIN UA: NEGATIVE
Glucose, UA: NEGATIVE
Ketones, UA: NEGATIVE
Nitrite, UA: NEGATIVE
PH UA: 7 (ref 5.0–7.5)
PROTEIN UA: NEGATIVE
Specific Gravity, UA: 1.02 (ref 1.005–1.030)
UUROB: 0.2 mg/dL (ref 0.2–1.0)

## 2017-06-23 LAB — BAYER DCA HB A1C WAIVED: HB A1C (BAYER DCA - WAIVED): 5.8 % (ref <7.0)

## 2017-06-23 LAB — MICROALBUMIN, URINE WAIVED
CREATININE, URINE WAIVED: 200 mg/dL (ref 10–300)
Microalb, Ur Waived: 30 mg/L — ABNORMAL HIGH (ref 0–19)

## 2017-06-23 LAB — IGP, APTIMA HPV, RFX 16/18,45
HPV Aptima: NEGATIVE
PAP Smear Comment: 0

## 2017-06-23 LAB — URINE CULTURE, REFLEX

## 2017-06-23 LAB — MICROSCOPIC EXAMINATION

## 2017-06-28 ENCOUNTER — Other Ambulatory Visit: Payer: Self-pay | Admitting: Family Medicine

## 2017-06-28 ENCOUNTER — Telehealth: Payer: Self-pay | Admitting: Family Medicine

## 2017-06-28 MED ORDER — LEVOTHYROXINE SODIUM 75 MCG PO TABS: 75.0000 ug | ORAL_TABLET | Freq: Every day | ORAL | 3 refills | Status: DC

## 2017-06-28 NOTE — Telephone Encounter (Signed)
Patient notified of lab results

## 2017-06-28 NOTE — Telephone Encounter (Signed)
Copied from [place]. Topic: Quick Communication - See Telephone Encounter >> Jun 28, 2017 10:40 AM Percell Belt A wrote: CRM for notification. See Telephone encounter for:  pt called in and would like someone to call her about her lab results.  She would also like to know if Dr is going to call in her Thyroid meds?   She only has 2 pills left   06/28/17.

## 2017-07-05 ENCOUNTER — Telehealth: Payer: Self-pay | Admitting: Family Medicine

## 2017-07-05 MED ORDER — LEVOTHYROXINE SODIUM 75 MCG PO TABS: 75.0000 ug | ORAL_TABLET | Freq: Every day | ORAL | 3 refills | Status: DC

## 2017-07-05 NOTE — Telephone Encounter (Signed)
New Rx sent to her pharmacy

## 2017-07-05 NOTE — Telephone Encounter (Signed)
Copied from [place]. Topic: Quick Communication - See Telephone Encounter >> Jul 05, 2017 11:36 AM Ahmed Prima L wrote: CRM for notification. See Telephone encounter for:   07/05/17.  Pt is requesting a generic on her LEVOTHYROXINE [facility] on [address].

## 2017-07-05 NOTE — Telephone Encounter (Signed)
Called pt who stated that she had been getting [facility] and was taking Levothyoxine. She is now getting med filled at [facility]. She stated that the pharmacist stated that the Brand name was ordered and the cost is $40.00. She states all this time she has been taking the generic version and was paying $ 0 per refill. She is requesting to have it changed back to generic. And sent to [facility] @ [address]

## 2017-07-05 NOTE — Telephone Encounter (Signed)
Routing to provider  

## 2017-09-07 ENCOUNTER — Other Ambulatory Visit: Payer: Self-pay | Admitting: Family Medicine

## 2017-09-07 DIAGNOSIS — Z1231 Encounter for screening mammogram for malignant neoplasm of breast: Secondary | ICD-10-CM

## 2017-09-09 ENCOUNTER — Other Ambulatory Visit: Payer: Self-pay | Admitting: Family Medicine

## 2017-09-10 NOTE — Telephone Encounter (Signed)
Unable to fill under [place] RX protocol  Gabapentin refill LOV: 06/21/17  IDC:[facility]UD[facility] [place]: [facility] [address]

## 2017-09-14 ENCOUNTER — Telehealth: Payer: Self-pay | Admitting: Family Medicine

## 2017-09-14 MED ORDER — HYDROCODONE-ACETAMINOPHEN 10-325 MG PO TABS: ORAL_TABLET | ORAL | 0 refills | Status: DC

## 2017-09-14 NOTE — Telephone Encounter (Signed)
Patient notified, states she will pick RX up Monday.

## 2017-09-14 NOTE — Telephone Encounter (Signed)
Copied from [place] [phone]. Topic: Quick Communication - Rx Refill/Question >> Sep 14, 2017  3:49 PM Carolyn Stare wrote: Medication   HYDROcodone-acetaminophen (NORCO) 10-325 MG tablet   [facility]erred Pharmacy  [facility] [place]   Agent: Please be advised that RX refills may take up to 3 business days. We ask that you follow-up with your pharmacy.

## 2017-09-14 NOTE — Telephone Encounter (Signed)
Refill request for Norco /. LOV 06/21/17 with Dr. Wynetta Emery / Patient would like medication to go to [facility].  Pharmacy updated in [place].

## 2017-09-14 NOTE — Telephone Encounter (Signed)
Pt called back to make sure the refill request was made for and she actually wants this at:   [place], [place] - [place] [phone] (Phone) [phone] (Fax)

## 2017-09-14 NOTE — Telephone Encounter (Signed)
Rx up front for her to pick up. We cannot call or fax that medicine in. Thanks!

## 2017-09-17 ENCOUNTER — Ambulatory Visit
Admission: RE | Admit: 2017-09-17 | Discharge: 2017-09-17 | Disposition: A | Discharge status: Home / Self Care | Payer: Managed Care, Other (non HMO) | Source: Ambulatory Visit | Attending: Family Medicine | Admitting: Family Medicine

## 2017-09-17 DIAGNOSIS — Z1231 Encounter for screening mammogram for malignant neoplasm of breast: Secondary | ICD-10-CM | POA: Insufficient documentation

## 2017-09-18 ENCOUNTER — Telehealth: Payer: Self-pay | Admitting: Family Medicine

## 2017-09-18 ENCOUNTER — Encounter: Payer: Self-pay | Admitting: Family Medicine

## 2017-09-18 NOTE — Telephone Encounter (Signed)
Copied from [place] [phone]. Topic: Quick Communication - Rx Refill/Question >> Sep 14, 2017  3:49 PM Carolyn Stare wrote: Medication   HYDROcodone-acetaminophen (NORCO) 10-325 MG tablet   [facility] [place]   Agent: Please be advised that RX refills may take up to 3 business days. We ask that you follow-up with your pharmacy. >> Sep 18, 2017 11:39 AM Neva Seat wrote: Pt is needing to speak with someone about insurance needing prior aurth for a full 30 day supply of Rx - HYDROcodone-acetaminophen (NORCO) 10-325 MG tablet. They are only approving for 7 day supply.  Please call pt to let her know what needs to be done asap.  [facility] [place], [place] - [address]  [place] [address]  Phone: [phone] Fax: [phone]

## 2017-09-18 NOTE — Telephone Encounter (Signed)
Britt, I'm not sure what to do about this?

## 2017-09-19 NOTE — Telephone Encounter (Signed)
PA submitted through Cover My Meds and pending determination.

## 2017-09-19 NOTE — Telephone Encounter (Signed)
Pt calling back about prior authorization on Norco stating that the pharmacy was faxing something over today

## 2017-09-19 NOTE — Telephone Encounter (Signed)
PA approved. Patient notified of approval and form faxed back to pharmacy to let them know as well.

## 2017-10-08 ENCOUNTER — Other Ambulatory Visit: Payer: Self-pay | Admitting: Family Medicine

## 2017-11-01 ENCOUNTER — Other Ambulatory Visit: Payer: Self-pay | Admitting: Family Medicine

## 2017-11-01 NOTE — Telephone Encounter (Signed)
Copied from [place] [phone]. Topic: Quick Communication - Rx Refill/Question >> Nov 01, 2017 10:31 AM Carolyn Stare wrote: Medication HYDROcodone-acetaminophen (NORCO) 10-325 MG tablet   need to be the exact same quanity as last refill    Has the patient contacted their pharmacy yes   ( Preferred Pharmacy  [facility]   Agent: Please be advised that RX refills may take up to 3 business days. We ask that you follow-up with your pharmacy.

## 2017-11-02 MED ORDER — HYDROCODONE-ACETAMINOPHEN 10-325 MG PO TABS: ORAL_TABLET | ORAL | 0 refills | Status: DC

## 2017-11-02 NOTE — Telephone Encounter (Signed)
Refill of hydrocodone  LOV 06/21/17  Dr. Wynetta Emery  [facility] 09/14/17  #68   0 refills  [id number] - [place], [place]

## 2017-12-01 ENCOUNTER — Other Ambulatory Visit: Payer: Self-pay | Admitting: Family Medicine

## 2017-12-26 ENCOUNTER — Other Ambulatory Visit: Payer: Self-pay | Admitting: Family Medicine

## 2017-12-26 NOTE — Telephone Encounter (Signed)
Needs appointment

## 2017-12-28 ENCOUNTER — Telehealth: Payer: Self-pay | Admitting: Family Medicine

## 2017-12-28 MED ORDER — HYDROCODONE-ACETAMINOPHEN 10-325 MG PO TABS: ORAL_TABLET | ORAL | 0 refills | Status: DC

## 2017-12-28 NOTE — Telephone Encounter (Signed)
Copied from [place]. Topic: Quick Communication - See Telephone Encounter >> Dec 28, 2017 11:19 AM Rutherford Nail, NT wrote: CRM for notification. See Telephone encounter for: 12/28/17. Patient calling to check on HYDROcodone-acetaminophen (NORCO) 10-325 MG tablet refill. Informed her that she would need to make an office visit. Medication refill visit made for 01/07/18 at 2:15pm. Would like to know if enough medication can be sent to the pharmacy to last until that appointment? States she only has 5 pills left. Please advise.

## 2017-12-28 NOTE — Telephone Encounter (Signed)
Rx sent to her pharmacy 

## 2018-01-07 ENCOUNTER — Encounter: Payer: Self-pay | Admitting: Family Medicine

## 2018-01-07 ENCOUNTER — Ambulatory Visit (INDEPENDENT_AMBULATORY_CARE_PROVIDER_SITE_OTHER): Payer: Managed Care, Other (non HMO) | Admitting: Family Medicine

## 2018-01-07 VITALS — BP 136/84 | HR 91 | Temp 98.6°F | Wt 216.2 lb

## 2018-01-07 DIAGNOSIS — Z6835 Body mass index (BMI) 35.0-35.9, adult: Secondary | ICD-10-CM | POA: Diagnosis not present

## 2018-01-07 DIAGNOSIS — E039 Hypothyroidism, unspecified: Secondary | ICD-10-CM

## 2018-01-07 DIAGNOSIS — J029 Acute pharyngitis, unspecified: Secondary | ICD-10-CM

## 2018-01-07 DIAGNOSIS — M4722 Other spondylosis with radiculopathy, cervical region: Secondary | ICD-10-CM

## 2018-01-07 DIAGNOSIS — G894 Chronic pain syndrome: Secondary | ICD-10-CM

## 2018-01-07 DIAGNOSIS — F3181 Bipolar II disorder: Secondary | ICD-10-CM

## 2018-01-07 DIAGNOSIS — E782 Mixed hyperlipidemia: Secondary | ICD-10-CM

## 2018-01-07 DIAGNOSIS — R7303 Prediabetes: Secondary | ICD-10-CM

## 2018-01-07 DIAGNOSIS — G8929 Other chronic pain: Secondary | ICD-10-CM | POA: Insufficient documentation

## 2018-01-07 LAB — BAYER DCA HB A1C WAIVED: HB A1C (BAYER DCA - WAIVED): 6.1 % (ref <7.0)

## 2018-01-07 MED ORDER — HYDROCODONE-ACETAMINOPHEN 10-325 MG PO TABS: ORAL_TABLET | ORAL | 0 refills | Status: DC

## 2018-01-07 MED ORDER — FLUOXETINE HCL 20 MG PO TABS: ORAL_TABLET | ORAL | 0 refills | Status: DC

## 2018-01-07 MED ORDER — SIMVASTATIN 20 MG PO TABS: ORAL_TABLET | ORAL | 1 refills | Status: DC

## 2018-01-07 MED ORDER — GABAPENTIN 600 MG PO TABS: 600.0000 mg | ORAL_TABLET | Freq: Two times a day (BID) | ORAL | 1 refills | Status: DC

## 2018-01-07 MED ORDER — QUETIAPINE FUMARATE 100 MG PO TABS: ORAL_TABLET | ORAL | 1 refills | Status: DC

## 2018-01-07 NOTE — Assessment & Plan Note (Addendum)
Under good control with A1c of 6.1. Call with any concerns. Refills given today. Continue to monitor.

## 2018-01-07 NOTE — Assessment & Plan Note (Signed)
Under good control on current regimen. Taking medicine less often. 3 month supply given today. Should last about 6 months.

## 2018-01-07 NOTE — Assessment & Plan Note (Signed)
Under good control. Call with any concerns. Refills given today. Continue to monitor.

## 2018-01-07 NOTE — Assessment & Plan Note (Signed)
Continue diet and exercise. Call with any concerns. Continue to monitor.

## 2018-01-07 NOTE — Progress Notes (Signed)
BP 136/84 (BP Location: Left Arm, Cuff Size: Normal)   Pulse 91   Temp 98.6 F (37 C)   Wt 216 lb 4 oz (98.1 kg)   SpO2 99%   BMI 36.43 kg/m    Subjective:    Patient ID: Carol Becker, female    DOB: 05-31-1961, 57 y.o.   MRN: [id number]  HPI: Carol Becker is a 57 y.o. female  Chief Complaint  Patient presents with  . Fatigue  . Hypertension  . Hyperlipidemia  . Hypothyroidism  . Pain   FATIGUE Duration:  6 months Severity: moderate  Onset: gradual Context when symptoms started:  unknown Symptoms improve with rest: no  Depressive symptoms: no Stress/anxiety: no Insomnia: no  Snoring: yes Observed apnea by bed partner: no Daytime hypersomnolence:yes Wakes feeling refreshed: no History of sleep study: yes Dysnea on exertion:  no Orthopnea/PND: no Chest pain: no Chronic cough: no Lower extremity edema: no Arthralgias:yes Myalgias: yes Weakness: no Rash: no  UPPER RESPIRATORY TRACT INFECTION Duration: 10 days Worst symptom: fatigue and sore throat Fever: no Cough: no Shortness of breath: no Wheezing: no Chest pain: no Chest tightness: no Chest congestion: no Nasal congestion: no Runny nose: no Post nasal drip: no Sneezing: no Sore throat: yes Swollen glands: no Sinus pressure: no Headache: no Face pain: no Toothache: no Ear pain: no  Ear pressure: no  Eyes red/itching:no Eye drainage/crusting: no  Vomiting: no Rash: no Fatigue: yes Sick contacts: no Strep contacts: no  Context: stable Recurrent sinusitis: no Relief with OTC cold/cough medications: no   Impaired Fasting Glucose Duration of elevated blood sugar: chronic Polydipsia: no Polyuria: no Weight change: yes Visual disturbance: no Glucose Monitoring: no Family history of diabetes: yes  HYPERTENSION / HYPERLIPIDEMIA Satisfied with current treatment? yes Duration of hypertension: chronic BP monitoring frequency: not checking BP medication side effects: no Past  BP meds: none Duration of hyperlipidemia: chronic Cholesterol medication side effects: no Cholesterol supplements: none Past cholesterol medications: simvastatin Medication compliance: excellent compliance Aspirin: no Recent stressors: yes Recurrent headaches: no Visual changes: no Palpitations: no Dyspnea: no Chest pain: no Lower extremity edema: no Dizzy/lightheaded: no  CHRONIC PAIN  Present dose: 10-20 Morphine equivalents Pain control status: controlled, has been doing dry needling Duration: chronic Location: Neck and arms Quality: aching and sore Current Pain Level: 3/10 Previous Pain Level: severe Breakthrough pain: no Benefit from narcotic medications: yes What Activities task can be accomplished with current medication?: Able to do her ADLs Interested in weaning off narcotics:no   Stool softners/OTC fiber: yes  Previous pain specialty evaluation: no Non-narcotic analgesic meds: yes Narcotic contract: yes  BIPOLAR Mood status: controlled Satisfied with current treatment?: yes Symptom severity: moderate  Duration of current treatment : chronic Side effects: no Medication compliance: excellent compliance Psychotherapy/counseling: no  Previous psychiatric medications: seroquel and prozac Depressed mood: no Anxious mood: no Anhedonia: no Significant weight loss or gain: yes Insomnia: no  Fatigue: yes Feelings of worthlessness or guilt: no Impaired concentration/indecisiveness: no Suicidal ideations: no Hopelessness: no Crying spells: no Depression screen [facility] 2/9 06/21/2017 12/19/2016 06/13/2016 04/20/2015  Decreased Interest 0 0 0 1  Down, Depressed, Hopeless 0 1 0 1  PHQ - 2 Score 0 1 0 2   HYPOTHYROIDISM Thyroid control status:controlled Satisfied with current treatment? yes Medication side effects: no Medication compliance: excellent compliance Recent dose adjustment:no Fatigue: yes Cold intolerance: no Heat intolerance: no Weight gain:  no Weight loss: no Constipation: no Diarrhea/loose stools: no Palpitations: no  Lower extremity edema: no Anxiety/depressed mood: no   Relevant past medical, surgical, family and social history reviewed and updated as indicated. Interim medical history since our last visit reviewed. Allergies and medications reviewed and updated.  Review of Systems  Constitutional: Positive for fatigue. Negative for activity change, appetite change, chills, diaphoresis, fever and unexpected weight change.  HENT: Positive for sore throat. Negative for congestion, dental problem, drooling, ear discharge, ear pain, facial swelling, hearing loss, mouth sores, nosebleeds, postnasal drip, rhinorrhea, sinus pressure, sinus pain, sneezing, tinnitus, trouble swallowing and voice change.   Respiratory: Negative.   Cardiovascular: Negative.   Gastrointestinal: Positive for abdominal pain. Negative for abdominal distention, anal bleeding, blood in stool, constipation, diarrhea, nausea, rectal pain and vomiting.  Genitourinary: Negative.   Skin: Negative.   Neurological: Negative.   Psychiatric/Behavioral: Negative.     Per HPI unless specifically indicated above     Objective:    BP 136/84 (BP Location: Left Arm, Cuff Size: Normal)   Pulse 91   Temp 98.6 F (37 C)   Wt 216 lb 4 oz (98.1 kg)   SpO2 99%   BMI 36.43 kg/m   Wt Readings from Last 3 Encounters:  01/07/18 216 lb 4 oz (98.1 kg)  06/21/17 207 lb 8 oz (94.1 kg)  04/09/17 206 lb 9.6 oz (93.7 kg)    Physical Exam  Constitutional: She is oriented to person, place, and time. She appears well-developed and well-nourished. No distress.  HENT:  Head: Normocephalic and atraumatic.  Right Ear: Hearing and external ear normal.  Left Ear: Hearing and external ear normal.  Nose: Nose normal.  Mouth/Throat: Oropharynx is clear and moist. No oropharyngeal exudate.  Eyes: Pupils are equal, round, and reactive to light. Conjunctivae, EOM and lids are  normal. Right eye exhibits no discharge. Left eye exhibits no discharge. No scleral icterus.  Neck: Normal range of motion. Neck supple. No JVD present. No tracheal deviation present. No thyromegaly present.  Cardiovascular: Normal rate, regular rhythm, normal heart sounds and intact distal pulses. Exam reveals no gallop and no friction rub.  No murmur heard. Pulmonary/Chest: Effort normal and breath sounds normal. No stridor. No respiratory distress. She has no wheezes. She has no rales. She exhibits no tenderness.  Musculoskeletal: Normal range of motion.  Lymphadenopathy:    She has no cervical adenopathy.  Neurological: She is alert and oriented to person, place, and time.  Skin: Skin is warm, dry and intact. Capillary refill takes less than 2 seconds. No rash noted. She is not diaphoretic. No erythema. No pallor.  Psychiatric: She has a normal mood and affect. Her speech is normal and behavior is normal. Judgment and thought content normal. Cognition and memory are normal.    Results for orders placed or performed in visit on 06/21/17  Microscopic Examination  Result Value Ref Range   WBC, UA 0-5 0 - 5 /hpf   RBC, UA 0-2 0 - 2 /hpf   Epithelial Cells (non renal) 0-10 0 - 10 /hpf   Renal Epithel, UA 0-10 (A) None seen /hpf   Bacteria, UA Few None seen/Few   Yeast, UA Present None seen  Urine Culture, Reflex  Result Value Ref Range   Urine Culture, Routine Final report (A)    Organism ID, Bacteria Comment (A)   Bayer DCA Hb A1c Waived  Result Value Ref Range   HB A1C (BAYER DCA - WAIVED) 5.8 <7.0 %  CBC with Differential/Platelet  Result Value Ref Range   WBC  7.2 3.4 - 10.8 x10E3/uL   RBC 4.32 3.77 - 5.28 x10E6/uL   Hemoglobin 13.6 11.1 - 15.9 g/dL   Hematocrit 40.1 34.0 - 46.6 %   MCV 93 79 - 97 fL   MCH 31.5 26.6 - 33.0 pg   MCHC 33.9 31.5 - 35.7 g/dL   RDW 13.3 12.3 - 15.4 %   Platelets 265 150 - 379 x10E3/uL   Neutrophils 53 Not Estab. %   Lymphs 35 Not Estab. %    Monocytes 9 Not Estab. %   Eos 2 Not Estab. %   Basos 1 Not Estab. %   Neutrophils Absolute 3.9 1.4 - 7.0 x10E3/uL   Lymphocytes Absolute 2.5 0.7 - 3.1 x10E3/uL   Monocytes Absolute 0.6 0.1 - 0.9 x10E3/uL   EOS (ABSOLUTE) 0.2 0.0 - 0.4 x10E3/uL   Basophils Absolute 0.0 0.0 - 0.2 x10E3/uL   Immature Granulocytes 0 Not Estab. %   Immature Grans (Abs) 0.0 0.0 - 0.1 x10E3/uL  Comprehensive metabolic panel  Result Value Ref Range   Glucose 75 65 - 99 mg/dL   BUN 15 6 - 24 mg/dL   Creatinine, Ser 0.81 0.57 - 1.00 mg/dL   GFR calc non Af Amer 82 >59 mL/min/1.73   GFR calc Af Amer 95 >59 mL/min/1.73   BUN/Creatinine Ratio 19 9 - 23   Sodium 142 134 - 144 mmol/L   Potassium 3.7 3.5 - 5.2 mmol/L   Chloride 104 96 - 106 mmol/L   CO2 27 20 - 29 mmol/L   Calcium 9.5 8.7 - 10.2 mg/dL   Total Protein 7.5 6.0 - 8.5 g/dL   Albumin 4.4 3.5 - 5.5 g/dL   Globulin, Total 3.1 1.5 - 4.5 g/dL   Albumin/Globulin Ratio 1.4 1.2 - 2.2   Bilirubin Total 0.3 0.0 - 1.2 mg/dL   Alkaline Phosphatase 81 39 - 117 IU/L   AST 21 0 - 40 IU/L   ALT 17 0 - 32 IU/L  Lipid Panel w/o Chol/HDL Ratio  Result Value Ref Range   Cholesterol, Total 182 100 - 199 mg/dL   Triglycerides 87 0 - 149 mg/dL   HDL 58 >39 mg/dL   VLDL Cholesterol Cal 17 5 - 40 mg/dL   LDL Calculated 107 (H) 0 - 99 mg/dL  Microalbumin, Urine Waived  Result Value Ref Range   Microalb, Ur Waived 30 (H) 0 - 19 mg/L   Creatinine, Urine Waived 200 10 - 300 mg/dL   Microalb/Creat Ratio <30 <30 mg/g  TSH  Result Value Ref Range   TSH 0.939 0.450 - 4.500 uIU/mL  UA/M w/rflx Culture, Routine  Result Value Ref Range   Specific Gravity, UA 1.020 1.005 - 1.030   pH, UA 7.0 5.0 - 7.5   Color, UA Yellow Yellow   Appearance Ur Hazy (A) Clear   Leukocytes, UA 2+ (A) Negative   Protein, UA Negative Negative/Trace   Glucose, UA Negative Negative   Ketones, UA Negative Negative   RBC, UA Trace (A) Negative   Bilirubin, UA Negative Negative    Urobilinogen, Ur 0.2 0.2 - 1.0 mg/dL   Nitrite, UA Negative Negative   Microscopic Examination See below:    Urinalysis Reflex Comment   Hepatitis C Antibody  Result Value Ref Range   Hep C Virus Ab <0.1 0.0 - 0.9 s/co ratio  HIV antibody  Result Value Ref Range   HIV Screen 4th Generation wRfx Non Reactive Non Reactive  IGP, Aptima HPV, rfx 16/18,45  Result Value Ref  Range   DIAGNOSIS: Comment    Specimen adequacy: Comment    Clinician Provided ICD10 Comment    Performed by: Comment    QC reviewed by: Comment    PAP Smear Comment .    Note: Comment    Test Methodology Comment    HPV Aptima Negative Negative      Assessment & Plan:   Problem List Items Addressed This Visit      Endocrine   Thyroid activity decreased - Primary    Rechecking levels today. Will adjust as needed. Call with any concerns.       Relevant Orders   CBC with Differential/Platelet   Comprehensive metabolic panel   TSH     Musculoskeletal and Integument   DJD (degenerative joint disease), cervical    Under good control on current regimen. Taking medicine less often. 3 month supply given today. Should last about 6 months.       Relevant Medications   HYDROcodone-acetaminophen (NORCO) 10-325 MG tablet   HYDROcodone-acetaminophen (NORCO) 10-325 MG tablet (Start on 02/07/2018)   HYDROcodone-acetaminophen ([place]) 10-325 MG tablet (Start on 03/10/2018)   Other Relevant Orders   CBC with Differential/Platelet   Comprehensive metabolic panel     Other   Hyperlipidemia    Under good control. Call with any concerns. Refills given today. Continue to monitor. Rechecking levels today.       Relevant Medications   simvastatin (ZOCOR) 20 MG tablet   Other Relevant Orders   CBC with Differential/Platelet   Comprehensive metabolic panel   Lipid Panel w/o Chol/HDL Ratio   Bipolar 2 disorder (HCC)    Under good control. Call with any concerns. Refills given today. Continue to monitor.       Relevant  Orders   CBC with Differential/Platelet   Comprehensive metabolic panel   Prediabetes    Under good control with A1c of 6.1. Call with any concerns. Refills given today. Continue to monitor.       Relevant Orders   CBC with Differential/Platelet   Bayer DCA Hb A1c Waived   Comprehensive metabolic panel   Class 2 severe obesity due to excess calories with serious comorbidity and body mass index (BMI) of 35.0 to 35.9 in adult [facility])    Continue diet and exercise. Call with any concerns. Continue to monitor.       Chronic pain    Under good control on current regimen. Taking medicine less often. 3 month supply given today. Should last about 6 months.       Relevant Medications   HYDROcodone-acetaminophen (NORCO) 10-325 MG tablet   gabapentin (NEURONTIN) 600 MG tablet   FLUoxetine (PROZAC) 20 MG tablet   HYDROcodone-acetaminophen (NORCO) 10-325 MG tablet (Start on 02/07/2018)   HYDROcodone-acetaminophen (NORCO) 10-325 MG tablet (Start on 03/10/2018)   Other Relevant Orders   CBC with Differential/Platelet   Comprehensive metabolic panel    Other Visit Diagnoses    Sore throat       Strep negative. Likely due to allergies or a virus. Monitor. Call if not getting better or getting worse.    Relevant Orders   Rapid Strep Screen ([facility] ONLY)       Follow up plan: Return in about 6 months (around 07/10/2018) for Physical.

## 2018-01-07 NOTE — Assessment & Plan Note (Signed)
Rechecking levels today. Will adjust as needed. Call with any concerns.

## 2018-01-07 NOTE — Assessment & Plan Note (Signed)
Under good control. Call with any concerns. Refills given today. Continue to monitor. Rechecking levels today.

## 2018-01-08 ENCOUNTER — Telehealth: Payer: Self-pay | Admitting: Family Medicine

## 2018-01-08 DIAGNOSIS — E039 Hypothyroidism, unspecified: Secondary | ICD-10-CM

## 2018-01-08 LAB — COMPREHENSIVE METABOLIC PANEL
A/G RATIO: 1.7 (ref 1.2–2.2)
ALK PHOS: 74 IU/L (ref 39–117)
ALT: 43 IU/L — AB (ref 0–32)
AST: 33 IU/L (ref 0–40)
Albumin: 4.3 g/dL (ref 3.5–5.5)
BUN/Creatinine Ratio: 10 (ref 9–23)
BUN: 10 mg/dL (ref 6–24)
Bilirubin Total: 0.2 mg/dL (ref 0.0–1.2)
CO2: 24 mmol/L (ref 20–29)
Calcium: 9.1 mg/dL (ref 8.7–10.2)
Chloride: 106 mmol/L (ref 96–106)
Creatinine, Ser: 0.98 mg/dL (ref 0.57–1.00)
GFR calc Af Amer: 75 mL/min/{1.73_m2} (ref >59)
GFR calc non Af Amer: 65 mL/min/{1.73_m2} (ref >59)
GLOBULIN, TOTAL: 2.5 g/dL (ref 1.5–4.5)
Glucose: 162 mg/dL — ABNORMAL HIGH (ref 65–99)
POTASSIUM: 4.1 mmol/L (ref 3.5–5.2)
SODIUM: 143 mmol/L (ref 134–144)
Total Protein: 6.8 g/dL (ref 6.0–8.5)

## 2018-01-08 LAB — CBC WITH DIFFERENTIAL/PLATELET
Basophils Absolute: 0 10*3/uL (ref 0.0–0.2)
Basos: 1 %
EOS (ABSOLUTE): 0.1 10*3/uL (ref 0.0–0.4)
Eos: 2 %
Hematocrit: 43.2 % (ref 34.0–46.6)
Hemoglobin: 14.1 g/dL (ref 11.1–15.9)
Immature Grans (Abs): 0 10*3/uL (ref 0.0–0.1)
Immature Granulocytes: 0 %
LYMPHS ABS: 2.2 10*3/uL (ref 0.7–3.1)
Lymphs: 34 %
MCH: 31.1 pg (ref 26.6–33.0)
MCHC: 32.6 g/dL (ref 31.5–35.7)
MCV: 95 fL (ref 79–97)
MONOS ABS: 0.5 10*3/uL (ref 0.1–0.9)
Monocytes: 8 %
NEUTROS PCT: 55 %
Neutrophils Absolute: 3.5 10*3/uL (ref 1.4–7.0)
PLATELETS: 242 10*3/uL (ref 150–450)
RBC: 4.54 x10E6/uL (ref 3.77–5.28)
RDW: 13.1 % (ref 12.3–15.4)
WBC: 6.3 10*3/uL (ref 3.4–10.8)

## 2018-01-08 LAB — LIPID PANEL W/O CHOL/HDL RATIO
CHOLESTEROL TOTAL: 173 mg/dL (ref 100–199)
HDL: 36 mg/dL — ABNORMAL LOW (ref >39)
LDL Calculated: 69 mg/dL (ref 0–99)
TRIGLYCERIDES: 338 mg/dL — AB (ref 0–149)
VLDL Cholesterol Cal: 68 mg/dL — ABNORMAL HIGH (ref 5–40)

## 2018-01-08 LAB — TSH: TSH: 0.298 u[IU]/mL — ABNORMAL LOW (ref 0.450–4.500)

## 2018-01-08 MED ORDER — LEVOTHYROXINE SODIUM 75 MCG PO TABS: 75.0000 ug | ORAL_TABLET | Freq: Every day | ORAL | 1 refills | Status: DC

## 2018-01-08 NOTE — Telephone Encounter (Signed)
Patient notified.  Appointment scheduled.

## 2018-01-08 NOTE — Telephone Encounter (Signed)
Please let her know that her thyroid is actually over treated right now. I know she's been having issues with fatigue, so we won't change the dose yet, I just want her to come in in 1 month to see if it's back where it should be. Order is in and refills sent.

## 2018-01-09 ENCOUNTER — Encounter: Payer: Self-pay | Admitting: Family Medicine

## 2018-01-10 LAB — CULTURE, GROUP A STREP

## 2018-01-10 LAB — RAPID STREP SCREEN (MED CTR MEBANE ONLY): STREP GP A AG, IA W/REFLEX: NEGATIVE

## 2018-02-08 ENCOUNTER — Other Ambulatory Visit: Payer: Managed Care, Other (non HMO)

## 2018-02-08 DIAGNOSIS — E039 Hypothyroidism, unspecified: Secondary | ICD-10-CM

## 2018-02-09 LAB — THYROID PANEL WITH TSH
Free Thyroxine Index: 2 (ref 1.2–4.9)
T3 UPTAKE RATIO: 28 % (ref 24–39)
T4 TOTAL: 7 ug/dL (ref 4.5–12.0)
TSH: 0.155 u[IU]/mL — AB (ref 0.450–4.500)

## 2018-02-13 ENCOUNTER — Other Ambulatory Visit: Payer: Self-pay | Admitting: Family Medicine

## 2018-02-13 MED ORDER — LEVOTHYROXINE SODIUM 50 MCG PO TABS: 50.0000 ug | ORAL_TABLET | Freq: Every day | ORAL | 0 refills | Status: DC

## 2018-02-26 ENCOUNTER — Other Ambulatory Visit: Payer: Self-pay | Admitting: Family Medicine

## 2018-02-26 NOTE — Telephone Encounter (Signed)
fluoxetine refill Last Refill:12/05/17 # 90 No RF Last OV: 01/07/18 PCP: Park Liter DO Pharmacy:[facility] [phone]

## 2018-03-15 ENCOUNTER — Other Ambulatory Visit: Payer: Self-pay | Admitting: Family Medicine

## 2018-03-15 ENCOUNTER — Encounter: Payer: Self-pay | Admitting: Family Medicine

## 2018-03-21 ENCOUNTER — Encounter: Payer: Self-pay | Admitting: Family Medicine

## 2018-03-21 ENCOUNTER — Encounter

## 2018-03-21 ENCOUNTER — Ambulatory Visit (INDEPENDENT_AMBULATORY_CARE_PROVIDER_SITE_OTHER): Payer: Managed Care, Other (non HMO) | Admitting: Family Medicine

## 2018-03-21 VITALS — BP 121/76 | HR 96 | Temp 97.9°F | Ht 64.6 in | Wt 220.0 lb

## 2018-03-21 DIAGNOSIS — M67441 Ganglion, right hand: Secondary | ICD-10-CM | POA: Diagnosis not present

## 2018-03-21 DIAGNOSIS — J069 Acute upper respiratory infection, unspecified: Secondary | ICD-10-CM

## 2018-03-21 MED ORDER — PREDNISONE 50 MG PO TABS: 50.0000 mg | ORAL_TABLET | Freq: Every day | ORAL | 0 refills | Status: DC

## 2018-03-21 MED ORDER — AMOXICILLIN-POT CLAVULANATE 875-125 MG PO TABS: 1.0000 | ORAL_TABLET | Freq: Two times a day (BID) | ORAL | 0 refills | Status: DC

## 2018-03-21 NOTE — Progress Notes (Signed)
BP 121/76   Pulse 96   Temp 97.9 F (36.6 C) (Oral)   Ht 5' 4.6" (1.641 m)   Wt 220 lb (99.8 kg)   SpO2 96%   BMI 37.06 kg/m    Subjective:    Patient ID: Carol Becker, female    DOB: 30-Jun-1961, 58 y.o.   MRN: [id number]  HPI: Carol Becker is a 57 y.o. female  Chief Complaint  Patient presents with  . Nasal Congestion    x 1week/ pt states have been taking tylenol cold & flu but not helping  . Cough   UPPER RESPIRATORY TRACT INFECTION Duration: 1 week Worst symptom: congestion and drainage Fever: no Cough: yes Shortness of breath: no Wheezing: no Chest pain: no Chest tightness: no Chest congestion: no Nasal congestion: yes Runny nose: yes Post nasal drip: yes Sneezing: no Sore throat: yes Swollen glands: yes Sinus pressure: no Headache: no Face pain: no Toothache: no Ear pain: no  Ear pressure: no  Eyes red/itching:no Eye drainage/crusting: no  Vomiting: no Rash: no Fatigue: yes Sick contacts: no Strep contacts: no  Context: stable Recurrent sinusitis: no Relief with OTC cold/cough medications: no  Treatments attempted: cold/sinus  Has a lump on the index finger on the R side- painful and making it hard for her to work.   Relevant past medical, surgical, family and social history reviewed and updated as indicated. Interim medical history since our last visit reviewed. Allergies and medications reviewed and updated.  Review of Systems  Constitutional: Positive for fatigue. Negative for activity change, appetite change, chills, diaphoresis, fever and unexpected weight change.  HENT: Positive for congestion, postnasal drip and rhinorrhea. Negative for dental problem, drooling, ear discharge, ear pain, facial swelling, hearing loss, mouth sores, nosebleeds, sinus pressure, sinus pain, sneezing, sore throat, tinnitus, trouble swallowing and voice change.   Eyes: Negative.   Respiratory: Negative.   Cardiovascular: Negative.     Gastrointestinal: Negative.   Psychiatric/Behavioral: Negative.     Per HPI unless specifically indicated above     Objective:    BP 121/76   Pulse 96   Temp 97.9 F (36.6 C) (Oral)   Ht 5' 4.6" (1.641 m)   Wt 220 lb (99.8 kg)   SpO2 96%   BMI 37.06 kg/m   Wt Readings from Last 3 Encounters:  03/21/18 220 lb (99.8 kg)  01/07/18 216 lb 4 oz (98.1 kg)  06/21/17 207 lb 8 oz (94.1 kg)    Physical Exam  Constitutional: She is oriented to person, place, and time. She appears well-developed and well-nourished. No distress.  HENT:  Head: Normocephalic and atraumatic.  Right Ear: Hearing and external ear normal.  Left Ear: Hearing and external ear normal.  Nose: Nose normal.  Mouth/Throat: Oropharynx is clear and moist. No oropharyngeal exudate.  Eyes: Pupils are equal, round, and reactive to light. Conjunctivae, EOM and lids are normal. Right eye exhibits no discharge. Left eye exhibits no discharge. No scleral icterus.  Neck: Normal range of motion. Neck supple. No JVD present. No tracheal deviation present. No thyromegaly present.  Cardiovascular: Normal rate, regular rhythm, normal heart sounds and intact distal pulses. Exam reveals no gallop and no friction rub.  No murmur heard. Pulmonary/Chest: Effort normal and breath sounds normal. No stridor. No respiratory distress. She has no wheezes. She has no rales. She exhibits no tenderness.  Musculoskeletal: Normal range of motion.  Painful lump on flexor tendon on radial side of R index finger  Lymphadenopathy:  She has no cervical adenopathy.  Neurological: She is alert and oriented to person, place, and time.  Skin: Skin is warm, dry and intact. Capillary refill takes less than 2 seconds. No rash noted. She is not diaphoretic. No erythema. No pallor.  Psychiatric: She has a normal mood and affect. Her speech is normal and behavior is normal. Judgment and thought content normal. Cognition and memory are normal.  Nursing note  and vitals reviewed.   Results for orders placed or performed in visit on 02/08/18  Thyroid Panel With TSH  Result Value Ref Range   TSH 0.155 (L) 0.450 - 4.500 uIU/mL   T4, Total 7.0 4.5 - 12.0 ug/dL   T3 Uptake Ratio 28 24 - 39 %   Free Thyroxine Index 2.0 1.2 - 4.9      Assessment & Plan:   Problem List Items Addressed This Visit    None    Visit Diagnoses    Viral upper respiratory tract infection    -  Primary   Not getting better. Will treat with prednisone, if not getting better, will take augmentin. Call with any concerns.    Ganglion cyst of flexor tendon sheath of finger, right       Will refer to hand surgeon. Call with any concerns.    Relevant Orders   Ambulatory referral to Hand Surgery       Follow up plan: Return if symptoms worsen or fail to improve.

## 2018-04-12 ENCOUNTER — Other Ambulatory Visit: Payer: Self-pay | Admitting: Family Medicine

## 2018-04-12 ENCOUNTER — Encounter: Payer: Self-pay | Admitting: Family Medicine

## 2018-04-12 MED ORDER — SCOPOLAMINE 1 MG/3DAYS TD PT72: 1.0000 | MEDICATED_PATCH | TRANSDERMAL | 12 refills | Status: DC

## 2018-05-07 ENCOUNTER — Other Ambulatory Visit: Payer: Self-pay | Admitting: Family Medicine

## 2018-05-07 MED ORDER — HYDROCODONE-ACETAMINOPHEN 10-325 MG PO TABS: ORAL_TABLET | ORAL | 0 refills | Status: DC

## 2018-05-24 DIAGNOSIS — M79643 Pain in unspecified hand: Secondary | ICD-10-CM | POA: Insufficient documentation

## 2018-06-24 ENCOUNTER — Encounter: Payer: Self-pay | Admitting: Family Medicine

## 2018-06-24 ENCOUNTER — Ambulatory Visit (INDEPENDENT_AMBULATORY_CARE_PROVIDER_SITE_OTHER): Payer: Managed Care, Other (non HMO) | Admitting: Family Medicine

## 2018-06-24 VITALS — BP 131/81 | HR 76 | Temp 98.7°F | Ht 64.0 in | Wt 220.6 lb

## 2018-06-24 DIAGNOSIS — Z1239 Encounter for other screening for malignant neoplasm of breast: Secondary | ICD-10-CM

## 2018-06-24 DIAGNOSIS — F3181 Bipolar II disorder: Secondary | ICD-10-CM | POA: Diagnosis not present

## 2018-06-24 DIAGNOSIS — Z0000 Encounter for general adult medical examination without abnormal findings: Secondary | ICD-10-CM

## 2018-06-24 DIAGNOSIS — E039 Hypothyroidism, unspecified: Secondary | ICD-10-CM

## 2018-06-24 DIAGNOSIS — E782 Mixed hyperlipidemia: Secondary | ICD-10-CM

## 2018-06-24 DIAGNOSIS — G894 Chronic pain syndrome: Secondary | ICD-10-CM

## 2018-06-24 DIAGNOSIS — R7303 Prediabetes: Secondary | ICD-10-CM

## 2018-06-24 DIAGNOSIS — D229 Melanocytic nevi, unspecified: Secondary | ICD-10-CM

## 2018-06-24 DIAGNOSIS — M79645 Pain in left finger(s): Secondary | ICD-10-CM

## 2018-06-24 DIAGNOSIS — Z6835 Body mass index (BMI) 35.0-35.9, adult: Secondary | ICD-10-CM

## 2018-06-24 LAB — UA/M W/RFLX CULTURE, ROUTINE
BILIRUBIN UA: NEGATIVE
Glucose, UA: NEGATIVE
Ketones, UA: NEGATIVE
Nitrite, UA: NEGATIVE
PH UA: 6.5 (ref 5.0–7.5)
PROTEIN UA: NEGATIVE
RBC, UA: NEGATIVE
Specific Gravity, UA: <1.005 — ABNORMAL LOW (ref 1.005–1.030)
Urobilinogen, Ur: 0.2 mg/dL (ref 0.2–1.0)

## 2018-06-24 LAB — MICROALBUMIN, URINE WAIVED
CREATININE, URINE WAIVED: 50 mg/dL (ref 10–300)
MICROALB, UR WAIVED: 10 mg/L (ref 0–19)

## 2018-06-24 LAB — MICROSCOPIC EXAMINATION
Bacteria, UA: NONE SEEN
RBC, UA: NONE SEEN /hpf (ref 0–2)

## 2018-06-24 LAB — BAYER DCA HB A1C WAIVED: HB A1C: 6.2 % (ref <7.0)

## 2018-06-24 MED ORDER — GABAPENTIN 600 MG PO TABS: 600.0000 mg | ORAL_TABLET | Freq: Two times a day (BID) | ORAL | 1 refills | Status: DC

## 2018-06-24 MED ORDER — QUETIAPINE FUMARATE 100 MG PO TABS: ORAL_TABLET | ORAL | 1 refills | Status: DC

## 2018-06-24 MED ORDER — SIMVASTATIN 20 MG PO TABS: ORAL_TABLET | ORAL | 1 refills | Status: DC

## 2018-06-24 MED ORDER — OMEPRAZOLE 20 MG PO CPDR: DELAYED_RELEASE_CAPSULE | ORAL | 3 refills | Status: DC

## 2018-06-24 MED ORDER — HYDROCODONE-ACETAMINOPHEN 10-325 MG PO TABS: ORAL_TABLET | ORAL | 0 refills | Status: DC

## 2018-06-24 MED ORDER — HYDROCODONE-ACETAMINOPHEN 10-325 MG PO TABS: ORAL_TABLET | ORAL | 0 refills | Status: AC

## 2018-06-24 MED ORDER — ONDANSETRON 4 MG PO TBDP: 4.0000 mg | ORAL_TABLET | Freq: Three times a day (TID) | ORAL | 6 refills | Status: DC | PRN

## 2018-06-24 MED ORDER — FLUOXETINE HCL 20 MG PO TABS: ORAL_TABLET | ORAL | 1 refills | Status: DC

## 2018-06-24 NOTE — Assessment & Plan Note (Signed)
Weight stable. Continue to monitor. Goal of losing 1-2lbs per week. Call with any concerns.

## 2018-06-24 NOTE — Assessment & Plan Note (Signed)
Under good control on current regimen. Continue current regimen. Continue to monitor. Call with any concerns. Refills given.   

## 2018-06-24 NOTE — Patient Instructions (Signed)
Health Maintenance for Postmenopausal Women Menopause is a normal process in which your reproductive ability comes to an end. This process happens gradually over a span of months to years, usually between the ages of 22 and 9. Menopause is complete when you have missed 12 consecutive menstrual periods. It is important to talk with your health care provider about some of the most common conditions that affect postmenopausal women, such as heart disease, cancer, and bone loss (osteoporosis). Adopting a healthy lifestyle and getting preventive care can help to promote your health and wellness. Those actions can also lower your chances of developing some of these common conditions. What should I know about menopause? During menopause, you may experience a number of symptoms, such as:  Moderate-to-severe hot flashes.  Night sweats.  Decrease in sex drive.  Mood swings.  Headaches.  Tiredness.  Irritability.  Memory problems.  Insomnia.  Choosing to treat or not to treat menopausal changes is an individual decision that you make with your health care provider. What should I know about hormone replacement therapy and supplements? Hormone therapy products are effective for treating symptoms that are associated with menopause, such as hot flashes and night sweats. Hormone replacement carries certain risks, especially as you become older. If you are thinking about using estrogen or estrogen with progestin treatments, discuss the benefits and risks with your health care provider. What should I know about heart disease and stroke? Heart disease, heart attack, and stroke become more likely as you age. This may be due, in part, to the hormonal changes that your body experiences during menopause. These can affect how your body processes dietary fats, triglycerides, and cholesterol. Heart attack and stroke are both medical emergencies. There are many things that you can do to help prevent heart disease  and stroke:  Have your blood pressure checked at least every 1-2 years. High blood pressure causes heart disease and increases the risk of stroke.  If you are 53-22 years old, ask your health care provider if you should take aspirin to prevent a heart attack or a stroke.  Do not use any tobacco products, including cigarettes, chewing tobacco, or electronic cigarettes. If you need help quitting, ask your health care provider.  It is important to eat a healthy diet and maintain a healthy weight. ? Be sure to include plenty of vegetables, fruits, low-fat dairy products, and lean protein. ? Avoid eating foods that are high in solid fats, added sugars, or salt (sodium).  Get regular exercise. This is one of the most important things that you can do for your health. ? Try to exercise for at least 150 minutes each week. The type of exercise that you do should increase your heart rate and make you sweat. This is known as moderate-intensity exercise. ? Try to do strengthening exercises at least twice each week. Do these in addition to the moderate-intensity exercise.  Know your numbers.Ask your health care provider to check your cholesterol and your blood glucose. Continue to have your blood tested as directed by your health care provider.  What should I know about cancer screening? There are several types of cancer. Take the following steps to reduce your risk and to catch any cancer development as early as possible. Breast Cancer  Practice breast self-awareness. ? This means understanding how your breasts normally appear and feel. ? It also means doing regular breast self-exams. Let your health care provider know about any changes, no matter how small.  If you are 40  or older, have a clinician do a breast exam (clinical breast exam or CBE) every year. Depending on your age, family history, and medical history, it may be recommended that you also have a yearly breast X-ray (mammogram).  If you  have a family history of breast cancer, talk with your health care provider about genetic screening.  If you are at high risk for breast cancer, talk with your health care provider about having an MRI and a mammogram every year.  Breast cancer (BRCA) gene test is recommended for women who have family members with BRCA-related cancers. Results of the assessment will determine the need for genetic counseling and BRCA1 and for BRCA2 testing. BRCA-related cancers include these types: ? Breast. This occurs in males or females. ? Ovarian. ? Tubal. This may also be called fallopian tube cancer. ? Cancer of the abdominal or pelvic lining (peritoneal cancer). ? Prostate. ? Pancreatic.  Cervical, Uterine, and Ovarian Cancer Your health care provider may recommend that you be screened regularly for cancer of the pelvic organs. These include your ovaries, uterus, and vagina. This screening involves a pelvic exam, which includes checking for microscopic changes to the surface of your cervix (Pap test).  For women ages 21-65, health care providers may recommend a pelvic exam and a Pap test every three years. For women ages 57-65, they may recommend the Pap test and pelvic exam, combined with testing for human papilloma virus (HPV), every five years. Some types of HPV increase your risk of cervical cancer. Testing for HPV may also be done on women of any age who have unclear Pap test results.  Other health care providers may not recommend any screening for nonpregnant women who are considered low risk for pelvic cancer and have no symptoms. Ask your health care provider if a screening pelvic exam is right for you.  If you have had past treatment for cervical cancer or a condition that could lead to cancer, you need Pap tests and screening for cancer for at least 20 years after your treatment. If Pap tests have been discontinued for you, your risk factors (such as having a new sexual partner) need to be  reassessed to determine if you should start having screenings again. Some women have medical problems that increase the chance of getting cervical cancer. In these cases, your health care provider may recommend that you have screening and Pap tests more often.  If you have a family history of uterine cancer or ovarian cancer, talk with your health care provider about genetic screening.  If you have vaginal bleeding after reaching menopause, tell your health care provider.  There are currently no reliable tests available to screen for ovarian cancer.  Lung Cancer Lung cancer screening is recommended for adults 41-74 years old who are at high risk for lung cancer because of a history of smoking. A yearly low-dose CT scan of the lungs is recommended if you:  Currently smoke.  Have a history of at least 30 pack-years of smoking and you currently smoke or have quit within the past 15 years. A pack-year is smoking an average of one pack of cigarettes per day for one year.  Yearly screening should:  Continue until it has been 15 years since you quit.  Stop if you develop a health problem that would prevent you from having lung cancer treatment.  Colorectal Cancer  This type of cancer can be detected and can often be prevented.  Routine colorectal cancer screening usually begins at  age 42 and continues through age 45.  If you have risk factors for colon cancer, your health care provider may recommend that you be screened at an earlier age.  If you have a family history of colorectal cancer, talk with your health care provider about genetic screening.  Your health care provider may also recommend using home test kits to check for hidden blood in your stool.  A small camera at the end of a tube can be used to examine your colon directly (sigmoidoscopy or colonoscopy). This is done to check for the earliest forms of colorectal cancer.  Direct examination of the colon should be repeated every  5-10 years until age 71. However, if early forms of precancerous polyps or small growths are found or if you have a family history or genetic risk for colorectal cancer, you may need to be screened more often.  Skin Cancer  Check your skin from head to toe regularly.  Monitor any moles. Be sure to tell your health care provider: ? About any new moles or changes in moles, especially if there is a change in a mole's shape or color. ? If you have a mole that is larger than the size of a pencil eraser.  If any of your family members has a history of skin cancer, especially at a young age, talk with your health care provider about genetic screening.  Always use sunscreen. Apply sunscreen liberally and repeatedly throughout the day.  Whenever you are outside, protect yourself by wearing long sleeves, pants, a wide-brimmed hat, and sunglasses.  What should I know about osteoporosis? Osteoporosis is a condition in which bone destruction happens more quickly than new bone creation. After menopause, you may be at an increased risk for osteoporosis. To help prevent osteoporosis or the bone fractures that can happen because of osteoporosis, the following is recommended:  If you are 46-71 years old, get at least 1,000 mg of calcium and at least 600 mg of vitamin D per day.  If you are older than age 55 but younger than age 65, get at least 1,200 mg of calcium and at least 600 mg of vitamin D per day.  If you are older than age 54, get at least 1,200 mg of calcium and at least 800 mg of vitamin D per day.  Smoking and excessive alcohol intake increase the risk of osteoporosis. Eat foods that are rich in calcium and vitamin D, and do weight-bearing exercises several times each week as directed by your health care provider. What should I know about how menopause affects my mental health? Depression may occur at any age, but it is more common as you become older. Common symptoms of depression  include:  Low or sad mood.  Changes in sleep patterns.  Changes in appetite or eating patterns.  Feeling an overall lack of motivation or enjoyment of activities that you previously enjoyed.  Frequent crying spells.  Talk with your health care provider if you think that you are experiencing depression. What should I know about immunizations? It is important that you get and maintain your immunizations. These include:  Tetanus, diphtheria, and pertussis (Tdap) booster vaccine.  Influenza every year before the flu season begins.  Pneumonia vaccine.  Shingles vaccine.  Your health care provider may also recommend other immunizations. This information is not intended to replace advice given to you by your health care provider. Make sure you discuss any questions you have with your health care provider. Document Released: 08/18/2005  Document Revised: 01/14/2016 Document Reviewed: 03/30/2015 Elsevier Interactive Patient Education  2018 [place] Angioma A cherry angioma is a harmless growth on the skin that is made up of blood vessels. Cherry angiomas are smooth, round, and red or purplish-red. They can be as small as the tip of a pin or as big as a pencil eraser. Cherry angiomas can appear anywhere on the body, but they usually appear on the trunk and arms. What are the causes? The cause of a cherry angioma is not known, but they seem related to advancing age. What increases the risk? Cherry angiomas occur most often in:  People over the age of 13.  People with a family member with this condition.  How is this diagnosed? A cherry angioma is diagnosed with a skin exam. Rarely, a piece of the cherry angioma may be removed for testing if it is not clear that the growth is a cherry angioma. How is this treated? Treatment is not needed. If you do not like the way a cherry angioma looks, you may have it removed. Removal methods include:  Electrocautery. In this method,  heat is used to burn the cherry angioma off the skin.  Cryosurgery. In this method, the cherry angioma is frozen. This causes it to eventually fall off the skin.  Laser therapy. In this method, a laser is used to destroy the red blood cells and blood vessels in the angioma.  Surgery.  A cherry angioma may come back after it has been removed. This information is not intended to replace advice given to you by your health care provider. Make sure you discuss any questions you have with your health care provider. Document Released: 09/04/2001 Document Revised: 12/02/2015 Document Reviewed: 04/16/2015 Elsevier Interactive Patient Education  [employer].

## 2018-06-24 NOTE — Progress Notes (Signed)
BP 131/81   Pulse 76   Temp 98.7 F (37.1 C) (Oral)   Ht 5\' 4"  (1.626 m)   Wt 220 lb 9.6 oz (100.1 kg)   SpO2 97%   BMI 37.87 kg/m    Subjective:    Patient ID: Carol Becker, female    DOB: 1960/09/10, 57 y.o.   MRN: [id number]  HPI: Carol Becker is a 57 y.o. female presenting on 06/24/2018 for comprehensive medical examination. Current medical complaints include:  Thumb on her L hand has been hurting her in her thumb- seeing ortho on Thursday. Has been getting worse.   Impaired Fasting Glucose HbA1C: 6.2 Duration of elevated blood sugar: chronic Polydipsia: no Polyuria: no Weight change: no Visual disturbance: no Glucose Monitoring: no Diabetic Education: Not Completed Family history of diabetes: yes  HYPOTHYROIDISM- stopped taking her thyroid medicine. Has not been taking it for a while Thyroid control status:stable Satisfied with current treatment? yes Medication side effects: no Medication compliance: excellent compliance Recent dose adjustment: yes Fatigue: yes Cold intolerance: no Heat intolerance: no Weight gain: no Weight loss: no Constipation: no Diarrhea/loose stools: no Palpitations: no Lower extremity edema: no Anxiety/depressed mood: no  HYPERLIPIDEMIA Hyperlipidemia status: excellent compliance Satisfied with current treatment?  yes Side effects:  no Medication compliance: excellent compliance Past cholesterol meds:  simvastatin Supplements: none Aspirin:  no The 10-year ASCVD risk score Mikey Bussing DC Jr., et al., 2013) is: 2.9%   Values used to calculate the score:     Age: 40 years     Sex: Female     Is Non-Hispanic African American: No     Diabetic: No     Tobacco smoker: No     Systolic Blood Pressure: 734 mmHg     Is BP treated: No     HDL Cholesterol: 36 mg/dL     Total Cholesterol: 173 mg/dL Chest pain:  no Coronary artery disease:  no  BIPOLAR Mood status: stable Satisfied with current treatment?: yes Symptom  severity: mild  Duration of current treatment : chronic Side effects: no Medication compliance: excellent compliance Psychotherapy/counseling: no  Previous psychiatric medications: seroquel, prozac Depressed mood: no Anxious mood: no Anhedonia: no Significant weight loss or gain: no Insomnia: no  Fatigue: no Feelings of worthlessness or guilt: no Impaired concentration/indecisiveness: no Suicidal ideations: no Hopelessness: no Crying spells: no Depression screen [facility] 2/9 06/24/2018 03/21/2018 01/07/2018 06/21/2017 12/19/2016  Decreased Interest 0 0 0 0 0  Down, Depressed, Hopeless 0 0 0 0 1  PHQ - 2 Score 0 0 0 0 1  Altered sleeping 0 0 2 - -  Tired, decreased energy 1 3 2  - -  Change in appetite 0 0 0 - -  Feeling bad or failure about yourself  0 0 0 - -  Trouble concentrating 0 0 1 - -  Moving slowly or fidgety/restless 0 0 0 - -  Suicidal thoughts 0 0 0 - -  PHQ-9 Score 1 3 5  - -  Difficult doing work/chores Not difficult at all Not difficult at all Somewhat difficult - -   CHRONIC PAIN  Present dose: 10-20 Morphine equivalents Pain control status: stable Duration: chronic Location: neck and arms Quality: aching and sore Current Pain Level: 3/10 Previous Pain Level: 3/10 Breakthrough pain: no Benefit from narcotic medications: yes What Activities task can be accomplished with current medication?: able to work and do her normal activities Interested in weaning off narcotics:no   Stool softners/OTC fiber: yes   Previous  pain specialty evaluation: yes Non-narcotic analgesic meds: yes Narcotic contract: yes  She currently lives with: husband Menopausal Symptoms: no  Depression Screen done today and results listed below:  Depression screen [facility] 2/9 06/24/2018 03/21/2018 01/07/2018 06/21/2017 12/19/2016  Decreased Interest 0 0 0 0 0  Down, Depressed, Hopeless 0 0 0 0 1  PHQ - 2 Score 0 0 0 0 1  Altered sleeping 0 0 2 - -  Tired, decreased energy 1 3 2  - -  Change in  appetite 0 0 0 - -  Feeling bad or failure about yourself  0 0 0 - -  Trouble concentrating 0 0 1 - -  Moving slowly or fidgety/restless 0 0 0 - -  Suicidal thoughts 0 0 0 - -  PHQ-9 Score 1 3 5  - -  Difficult doing work/chores Not difficult at all Not difficult at all Somewhat difficult - -    Past Medical History:  Past Medical History:  Diagnosis Date  . Bipolar 1 disorder ([place])   . Cervical spinal stenosis   . GERD (gastroesophageal reflux disease)   . Hyperlipidemia   . Osteopenia   . Sleep apnea   . Thyroid disease     Surgical History:  Past Surgical History:  Procedure Laterality Date  . ANTERIOR / POSTERIOR COMBINED FUSION CERVICAL SPINE    . KNEE ARTHROSCOPY WITH EXCISION BAKER'S CYST    . TUBAL LIGATION      Medications:  Current Outpatient Medications on File Prior to Visit  Medication Sig  . scopolamine (TRANSDERM-SCOP, 1.5 MG,) 1 MG/3DAYS Place 1 patch (1.5 mg total) onto the skin every 3 (three) days.   No current facility-administered medications on file prior to visit.     Allergies:  Allergies  Allergen Reactions  . Codeine Phosphate [Codeine] Other (See Comments)    Jittery  . Crestor [Rosuvastatin Calcium] Other (See Comments)    Indigestion  . Rosuvastatin Calcium Other (See Comments)    Indigestion  . Sulfa Antibiotics Nausea Only  . Zithromax [Azithromycin] Other (See Comments)    Not effective    Social History:  Social History   Socioeconomic History  . Marital status: Married    Spouse name: Not on file  . Number of children: Not on file  . Years of education: Not on file  . Highest education level: Not on file  Occupational History  . Not on file  Social Needs  . Financial resource strain: Not on file  . Food insecurity:    Worry: Not on file    Inability: Not on file  . Transportation needs:    Medical: Not on file    Non-medical: Not on file  Tobacco Use  . Smoking status: Former Smoker    Last attempt to quit:  04/06/2011    Years since quitting: 7.2  . Smokeless tobacco: Never Used  Substance and Sexual Activity  . Alcohol use: Yes    Comment: On Occasion  . Drug use: No  . Sexual activity: Yes    Birth control/protection: Post-menopausal  Lifestyle  . Physical activity:    Days per week: Not on file    Minutes per session: Not on file  . Stress: Not on file  Relationships  . Social connections:    Talks on phone: Not on file    Gets together: Not on file    Attends religious service: Not on file    Active member of club or organization: Not on file  Attends meetings of clubs or organizations: Not on file    Relationship status: Not on file  . Intimate partner violence:    Fear of current or ex partner: Not on file    Emotionally abused: Not on file    Physically abused: Not on file    Forced sexual activity: Not on file  Other Topics Concern  . Not on file  Social History Narrative  . Not on file   Social History   Tobacco Use  Smoking Status Former Smoker  . Last attempt to quit: 04/06/2011  . Years since quitting: 7.2  Smokeless Tobacco Never Used   Social History   Substance and Sexual Activity  Alcohol Use Yes   Comment: On Occasion    Family History:  Family History  Problem Relation Age of Onset  . Heart disease Father        CAD  . Heart disease Sister 65       CAD  . Cancer Maternal Grandmother        breast  . Breast cancer Maternal Grandmother 44  . Cancer Paternal Grandmother        breast  . Breast cancer Paternal Grandmother 40  . Alzheimer's disease Maternal Grandfather     Past medical history, surgical history, medications, allergies, family history and social history reviewed with patient today and changes made to appropriate areas of the chart.   Review of Systems  Constitutional: Negative.   HENT: Negative.   Eyes: Negative.   Respiratory: Negative.   Cardiovascular: Negative.   Gastrointestinal: Positive for abdominal pain.  Negative for blood in stool, constipation, diarrhea, heartburn, melena, nausea and vomiting.  Genitourinary: Negative.   Musculoskeletal: Positive for joint pain, myalgias and neck pain. Negative for back pain and falls.  Skin: Negative.        Numerous moles  Neurological: Negative.   Endo/Heme/Allergies: Negative.   Psychiatric/Behavioral: Negative.     All other ROS negative except what is listed above and in the HPI.      Objective:    BP 131/81   Pulse 76   Temp 98.7 F (37.1 C) (Oral)   Ht 5\' 4"  (1.626 m)   Wt 220 lb 9.6 oz (100.1 kg)   SpO2 97%   BMI 37.87 kg/m   Wt Readings from Last 3 Encounters:  06/24/18 220 lb 9.6 oz (100.1 kg)  03/21/18 220 lb (99.8 kg)  01/07/18 216 lb 4 oz (98.1 kg)    Physical Exam Vitals signs and nursing note reviewed.  Constitutional:      General: She is not in acute distress.    Appearance: Normal appearance. She is obese. She is not ill-appearing, toxic-appearing or diaphoretic.  HENT:     Head: Normocephalic and atraumatic.     Right Ear: Tympanic membrane, ear canal and external ear normal. There is no impacted cerumen.     Left Ear: Tympanic membrane, ear canal and external ear normal. There is no impacted cerumen.     Nose: Nose normal. No congestion or rhinorrhea.     Mouth/Throat:     Mouth: Mucous membranes are moist.     Pharynx: Oropharynx is clear. No oropharyngeal exudate or posterior oropharyngeal erythema.  Eyes:     General: No scleral icterus.       Right eye: No discharge.        Left eye: No discharge.     Extraocular Movements: Extraocular movements intact.     Conjunctiva/sclera: Conjunctivae normal.  Pupils: Pupils are equal, round, and reactive to light.  Neck:     Musculoskeletal: Normal range of motion and neck supple. No neck rigidity or muscular tenderness.     Vascular: No carotid bruit.  Cardiovascular:     Rate and Rhythm: Normal rate and regular rhythm.     Pulses: Normal pulses.     Heart  sounds: Normal heart sounds. No murmur. No friction rub. No gallop.   Pulmonary:     Effort: Pulmonary effort is normal. No respiratory distress.     Breath sounds: Normal breath sounds. No stridor. No wheezing, rhonchi or rales.  Chest:     Chest wall: No tenderness.  Abdominal:     General: Abdomen is flat. Bowel sounds are normal. There is no distension.     Palpations: Abdomen is soft. There is no mass.     Tenderness: There is no abdominal tenderness. There is no right CVA tenderness, left CVA tenderness, guarding or rebound.     Hernia: No hernia is present.  Genitourinary:    Comments: Breast and pelvic exams deferred with shared decision making Musculoskeletal: Normal range of motion.        General: No swelling, tenderness, deformity or signs of injury.     Right lower leg: No edema.     Left lower leg: No edema.  Lymphadenopathy:     Cervical: No cervical adenopathy.  Skin:    General: Skin is warm and dry.     Capillary Refill: Capillary refill takes less than 2 seconds.     Coloration: Skin is not jaundiced or pale.     Findings: No bruising, erythema, lesion or rash.  Neurological:     General: No focal deficit present.     Mental Status: She is alert and oriented to person, place, and time. Mental status is at baseline.     Cranial Nerves: No cranial nerve deficit.     Sensory: No sensory deficit.     Motor: No weakness.     Coordination: Coordination normal.     Gait: Gait normal.     Deep Tendon Reflexes: Reflexes normal.  Psychiatric:        Mood and Affect: Mood normal.        Behavior: Behavior normal.        Thought Content: Thought content normal.        Judgment: Judgment normal.     Results for orders placed or performed in visit on 02/08/18  Thyroid Panel With TSH  Result Value Ref Range   TSH 0.155 (L) 0.450 - 4.500 uIU/mL   T4, Total 7.0 4.5 - 12.0 ug/dL   T3 Uptake Ratio 28 24 - 39 %   Free Thyroxine Index 2.0 1.2 - 4.9      Assessment &  Plan:   Problem List Items Addressed This Visit      Endocrine   Thyroid activity decreased    Off medicine. Will recheck levels today and treat as needed.       Relevant Orders   CBC with Differential/Platelet   Comprehensive metabolic panel   TSH   UA/M w/rflx Culture, Routine     Other   Hyperlipidemia    Under good control on current regimen. Continue current regimen. Continue to monitor. Call with any concerns. Refills given. Checking labs today.       Relevant Medications   simvastatin (ZOCOR) 20 MG tablet   Other Relevant Orders   CBC with Differential/Platelet  Comprehensive metabolic panel   Lipid Panel w/o Chol/HDL Ratio   UA/M w/rflx Culture, Routine   Bipolar 2 disorder (HCC)    Under good control on current regimen. Continue current regimen. Continue to monitor. Call with any concerns. Refills given.        Relevant Orders   CBC with Differential/Platelet   Comprehensive metabolic panel   UA/M w/rflx Culture, Routine   Prediabetes    Under good control with a1c of 6.2. Continue diet and exercise. Continue to monitor. Call with any concerns.      Relevant Orders   CBC with Differential/Platelet   Bayer DCA Hb A1c Waived   Comprehensive metabolic panel   Microalbumin, Urine Waived   UA/M w/rflx Culture, Routine   Class 2 severe obesity due to excess calories with serious comorbidity and body mass index (BMI) of 35.0 to 35.9 in adult (HCC)    Weight stable. Continue to monitor. Goal of losing 1-2lbs per week. Call with any concerns.       Chronic pain    Under good control on current regimen. Taking medicine less often. 3 month supply given today. Should last about 6 months.       Relevant Medications   FLUoxetine (PROZAC) 20 MG tablet   gabapentin (NEURONTIN) 600 MG tablet   HYDROcodone-acetaminophen (NORCO) 10-325 MG tablet   HYDROcodone-acetaminophen (NORCO) 10-325 MG tablet (Start on 07/24/2018)   HYDROcodone-acetaminophen (NORCO) 10-325 MG  tablet (Start on 08/23/2018)    Other Visit Diagnoses    Routine general medical examination at a health care facility    -  Primary   Vaccines up to date. Screening labs checked today. Mammogram, pap and colonoscopy up to date. Continue diet and exercise. call with any concerns.    Relevant Orders   CBC with Differential/Platelet   Bayer DCA Hb A1c Waived   Comprehensive metabolic panel   Lipid Panel w/o Chol/HDL Ratio   Microalbumin, Urine Waived   TSH   UA/M w/rflx Culture, Routine   Pain of left thumb       Seeing ortho. Got referral from neurosurgeon. Unsure if she needs one from PCP- given today.   Relevant Orders   Ambulatory referral to Hand Surgery   Numerous moles       Would like to see dermatology. Referral generated today.   Relevant Orders   Ambulatory referral to Dermatology   Screening for breast cancer       mammogram due in March. Ordered today.   Relevant Orders   MM DIGITAL SCREENING BILATERAL       Follow up plan: Return in about 6 months (around 12/24/2018) for follow up.   LABORATORY TESTING:  - Pap smear: up to date  IMMUNIZATIONS:   - Tdap: Tetanus vaccination status reviewed: last tetanus booster within 10 years. - Influenza: Up to date - Pneumovax: Not applicable - Prevnar: Not applicable - Zostavax vaccine: Given elsewhere  SCREENING: -Mammogram: Up to date  - Colonoscopy: Up to date   PATIENT COUNSELING:   Advised to take 1 mg of folate supplement per day if capable of pregnancy.   Sexuality: Discussed sexually transmitted diseases, partner selection, use of condoms, avoidance of unintended pregnancy  and contraceptive alternatives.   Advised to avoid cigarette smoking.  I discussed with the patient that most people either abstain from alcohol or drink within safe limits (<=14/week and <=4 drinks/occasion for males, <=7/weeks and <= 3 drinks/occasion for females) and that the risk for alcohol disorders and other  health effects rises  proportionally with the number of drinks per week and how often a drinker exceeds daily limits.  Discussed cessation/primary prevention of drug use and availability of treatment for abuse.   Diet: Encouraged to adjust caloric intake to maintain  or achieve ideal body weight, to reduce intake of dietary saturated fat and total fat, to limit sodium intake by avoiding high sodium foods and not adding table salt, and to maintain adequate dietary potassium and calcium preferably from fresh fruits, vegetables, and low-fat dairy products.    stressed the importance of regular exercise  Injury prevention: Discussed safety belts, safety helmets, smoke detector, smoking near bedding or upholstery.   Dental health: Discussed importance of regular tooth brushing, flossing, and dental visits.    NEXT PREVENTATIVE PHYSICAL DUE IN 1 YEAR. Return in about 6 months (around 12/24/2018) for follow up.

## 2018-06-24 NOTE — Assessment & Plan Note (Signed)
Under good control on current regimen. Continue current regimen. Continue to monitor. Call with any concerns. Refills given. Checking labs today.

## 2018-06-24 NOTE — Assessment & Plan Note (Signed)
Off medicine. Will recheck levels today and treat as needed.

## 2018-06-24 NOTE — Assessment & Plan Note (Signed)
Under good control with a1c of 6.2. Continue diet and exercise. Continue to monitor. Call with any concerns.

## 2018-06-24 NOTE — Assessment & Plan Note (Signed)
Under good control on current regimen. Taking medicine less often. 3 month supply given today. Should last about 6 months.

## 2018-06-25 LAB — COMPREHENSIVE METABOLIC PANEL
A/G RATIO: 2 (ref 1.2–2.2)
ALK PHOS: 88 IU/L (ref 39–117)
ALT: 51 IU/L — ABNORMAL HIGH (ref 0–32)
AST: 36 IU/L (ref 0–40)
Albumin: 4.7 g/dL (ref 3.5–5.5)
BILIRUBIN TOTAL: 0.3 mg/dL (ref 0.0–1.2)
BUN / CREAT RATIO: 11 (ref 9–23)
BUN: 11 mg/dL (ref 6–24)
CHLORIDE: 103 mmol/L (ref 96–106)
CO2: 22 mmol/L (ref 20–29)
Calcium: 9.3 mg/dL (ref 8.7–10.2)
Creatinine, Ser: 1.01 mg/dL — ABNORMAL HIGH (ref 0.57–1.00)
GFR calc non Af Amer: 62 mL/min/{1.73_m2} (ref >59)
GFR, EST AFRICAN AMERICAN: 72 mL/min/{1.73_m2} (ref >59)
Globulin, Total: 2.4 g/dL (ref 1.5–4.5)
Glucose: 133 mg/dL — ABNORMAL HIGH (ref 65–99)
POTASSIUM: 4.3 mmol/L (ref 3.5–5.2)
Sodium: 142 mmol/L (ref 134–144)
TOTAL PROTEIN: 7.1 g/dL (ref 6.0–8.5)

## 2018-06-25 LAB — CBC WITH DIFFERENTIAL/PLATELET
Basophils Absolute: 0 10*3/uL (ref 0.0–0.2)
Basos: 1 %
EOS (ABSOLUTE): 0.2 10*3/uL (ref 0.0–0.4)
Eos: 2 %
Hematocrit: 40.1 % (ref 34.0–46.6)
Hemoglobin: 13.5 g/dL (ref 11.1–15.9)
IMMATURE GRANS (ABS): 0 10*3/uL (ref 0.0–0.1)
Immature Granulocytes: 0 %
LYMPHS ABS: 2.2 10*3/uL (ref 0.7–3.1)
LYMPHS: 30 %
MCH: 31 pg (ref 26.6–33.0)
MCHC: 33.7 g/dL (ref 31.5–35.7)
MCV: 92 fL (ref 79–97)
MONOS ABS: 0.6 10*3/uL (ref 0.1–0.9)
Monocytes: 9 %
NEUTROS ABS: 4.2 10*3/uL (ref 1.4–7.0)
Neutrophils: 58 %
PLATELETS: 268 10*3/uL (ref 150–450)
RBC: 4.36 x10E6/uL (ref 3.77–5.28)
RDW: 12.5 % (ref 12.3–15.4)
WBC: 7.2 10*3/uL (ref 3.4–10.8)

## 2018-06-25 LAB — LIPID PANEL W/O CHOL/HDL RATIO
Cholesterol, Total: 163 mg/dL (ref 100–199)
HDL: 45 mg/dL (ref >39)
LDL Calculated: 80 mg/dL (ref 0–99)
Triglycerides: 191 mg/dL — ABNORMAL HIGH (ref 0–149)
VLDL Cholesterol Cal: 38 mg/dL (ref 5–40)

## 2018-06-25 LAB — TSH: TSH: 1.64 u[IU]/mL (ref 0.450–4.500)

## 2018-08-05 ENCOUNTER — Telehealth: Payer: Self-pay

## 2018-08-05 ENCOUNTER — Encounter: Payer: Self-pay | Admitting: Family Medicine

## 2018-08-05 DIAGNOSIS — M79645 Pain in left finger(s): Secondary | ICD-10-CM

## 2018-08-05 NOTE — Telephone Encounter (Signed)
Error

## 2018-09-01 DIAGNOSIS — M25542 Pain in joints of left hand: Secondary | ICD-10-CM | POA: Insufficient documentation

## 2018-09-01 DIAGNOSIS — M1812 Unilateral primary osteoarthritis of first carpometacarpal joint, left hand: Secondary | ICD-10-CM | POA: Insufficient documentation

## 2018-09-11 ENCOUNTER — Other Ambulatory Visit: Payer: Self-pay | Admitting: Family Medicine

## 2018-09-11 NOTE — Telephone Encounter (Signed)
Dr. Jeananne Rama, please disregard. Routing to Dr. Wynetta Emery.

## 2018-09-12 ENCOUNTER — Encounter: Payer: Self-pay | Admitting: Family Medicine

## 2018-09-12 NOTE — Telephone Encounter (Signed)
Please note that I explained to patient that it was sent to the pharmacy from our office on 08/23/2018, not that it had been picked up from the pharmacy.

## 2018-09-12 NOTE — Telephone Encounter (Signed)
We talked about this last visit that her 3 month supply should last 6 months as she was taking it less often, if she is needing it more- she will need an appointment to discuss this.

## 2018-09-12 NOTE — Telephone Encounter (Signed)
Patient called and scheduled for today. Patient upset. States she is not overtaking her medication. States she only gets filled maybe once a month and would like state registry checked.

## 2018-09-13 ENCOUNTER — Ambulatory Visit: Payer: Self-pay | Admitting: Family Medicine

## 2018-11-21 ENCOUNTER — Encounter: Payer: Self-pay | Admitting: Family Medicine

## 2018-11-25 ENCOUNTER — Telehealth: Payer: Self-pay | Admitting: Family Medicine

## 2018-11-25 ENCOUNTER — Encounter: Payer: Self-pay | Admitting: Family Medicine

## 2018-11-25 ENCOUNTER — Ambulatory Visit (INDEPENDENT_AMBULATORY_CARE_PROVIDER_SITE_OTHER): Payer: Managed Care, Other (non HMO) | Admitting: Family Medicine

## 2018-11-25 ENCOUNTER — Other Ambulatory Visit: Payer: Self-pay

## 2018-11-25 VITALS — BP 107/79 | HR 73 | Temp 98.3°F | Ht 64.0 in | Wt 219.0 lb

## 2018-11-25 DIAGNOSIS — E782 Mixed hyperlipidemia: Secondary | ICD-10-CM

## 2018-11-25 DIAGNOSIS — E039 Hypothyroidism, unspecified: Secondary | ICD-10-CM | POA: Diagnosis not present

## 2018-11-25 DIAGNOSIS — M4722 Other spondylosis with radiculopathy, cervical region: Secondary | ICD-10-CM

## 2018-11-25 DIAGNOSIS — M1990 Unspecified osteoarthritis, unspecified site: Secondary | ICD-10-CM

## 2018-11-25 DIAGNOSIS — R7303 Prediabetes: Secondary | ICD-10-CM

## 2018-11-25 DIAGNOSIS — F3181 Bipolar II disorder: Secondary | ICD-10-CM

## 2018-11-25 DIAGNOSIS — G894 Chronic pain syndrome: Secondary | ICD-10-CM

## 2018-11-25 LAB — BAYER DCA HB A1C WAIVED: HB A1C (BAYER DCA - WAIVED): 6.5 % (ref <7.0)

## 2018-11-25 MED ORDER — QUETIAPINE FUMARATE 100 MG PO TABS: ORAL_TABLET | ORAL | 1 refills | Status: DC

## 2018-11-25 MED ORDER — HYDROCODONE-ACETAMINOPHEN 10-325 MG PO TABS: 0.5000 | ORAL_TABLET | Freq: Two times a day (BID) | ORAL | 0 refills | Status: AC

## 2018-11-25 MED ORDER — OMEPRAZOLE 20 MG PO CPDR: DELAYED_RELEASE_CAPSULE | ORAL | 3 refills | Status: DC

## 2018-11-25 MED ORDER — FLUOXETINE HCL 20 MG PO TABS: ORAL_TABLET | ORAL | 1 refills | Status: DC

## 2018-11-25 MED ORDER — SIMVASTATIN 20 MG PO TABS: ORAL_TABLET | ORAL | 1 refills | Status: DC

## 2018-11-25 MED ORDER — HYDROCODONE-ACETAMINOPHEN 10-325 MG PO TABS: ORAL_TABLET | ORAL | 0 refills | Status: AC

## 2018-11-25 MED ORDER — GABAPENTIN 600 MG PO TABS: 600.0000 mg | ORAL_TABLET | Freq: Two times a day (BID) | ORAL | 1 refills | Status: DC

## 2018-11-25 NOTE — Telephone Encounter (Signed)
Sugars came back at 6.5- can we please get her in in 3 months rather than 6 for recheck on sugars? Thanks!

## 2018-11-25 NOTE — Assessment & Plan Note (Signed)
Under good control on current regimen. Continue current regimen. Continue to monitor. Call with any concerns. Refills given.   

## 2018-11-25 NOTE — Assessment & Plan Note (Signed)
Continue to follow with neurosurgery as needed. Call with any concerns.

## 2018-11-25 NOTE — Assessment & Plan Note (Signed)
Under good control on current regimen. Continue current regimen. Continue to monitor. Call with any concerns. Refills given for 3 months- should last about 6 months, if needing before follow up, will call and come in.

## 2018-11-25 NOTE — Assessment & Plan Note (Signed)
Checking labs today. Await results. Call with any concerns. Continue to monitor.

## 2018-11-25 NOTE — Progress Notes (Signed)
BP 107/79   Pulse 73   Temp 98.3 F (36.8 C) (Oral)   Ht '5\' 4"'  (1.626 m)   Wt 219 lb (99.3 kg)   SpO2 95%   BMI 37.59 kg/m    Subjective:    Patient ID: Carol Becker, female    DOB: 1960-07-13, 58 y.o.   MRN: [id number]  HPI: AAMIRA Becker is a 58 y.o. female  Chief Complaint  Patient presents with  . Neck Pain    pain has started after her neck surgery about a few years ago  . Back Pain   CHRONIC PAIN- has been out of her medicine since Wednesday  Present dose: 10-20 Morphine equivalents Pain control status: uncontrolled Duration: chronic Location: neck and arms Quality: aching and sore Current Pain Level: moderate Previous Pain Level: 3/10 Breakthrough pain: yes Benefit from narcotic medications: yes What Activities task can be accomplished with current medication?- able to work and do her normal activities Interested in weaning off narcotics:no   Stool softners/OTC fiber: yes  Previous pain specialty evaluation: yes Non-narcotic analgesic meds: yes Narcotic contract: yes  HYPOTHYROIDISM Thyroid control status:controlled Satisfied with current treatment? yes Medication side effects: no Medication compliance: excellent compliance Etiology of hypothyroidism:  Recent dose adjustment:no Fatigue: no Cold intolerance: no Heat intolerance: no Weight gain: no Weight loss: no Constipation: no Diarrhea/loose stools: no Palpitations: no Lower extremity edema: no Anxiety/depressed mood: no  BIPOLAR Mood status: controlled Satisfied with current treatment?: yes Symptom severity: mild  Duration of current treatment : chronic Side effects: no Medication compliance: excellent compliance Psychotherapy/counseling: no  Previous psychiatric medications: seroquel, prozac Depressed mood: no Anxious mood: no Anhedonia: no Significant weight loss or gain: no Insomnia: no  Fatigue: no Feelings of worthlessness or guilt: no Impaired  concentration/indecisiveness: no Suicidal ideations: no Hopelessness: no Crying spells: no Depression screen [facility] 2/9 06/24/2018 03/21/2018 01/07/2018 06/21/2017 12/19/2016  Decreased Interest 0 0 0 0 0  Down, Depressed, Hopeless 0 0 0 0 1  PHQ - 2 Score 0 0 0 0 1  Altered sleeping 0 0 2 - -  Tired, decreased energy '1 3 2 ' - -  Change in appetite 0 0 0 - -  Feeling bad or failure about yourself  0 0 0 - -  Trouble concentrating 0 0 1 - -  Moving slowly or fidgety/restless 0 0 0 - -  Suicidal thoughts 0 0 0 - -  PHQ-9 Score '1 3 5 ' - -  Difficult doing work/chores Not difficult at all Not difficult at all Somewhat difficult - -   HYPERLIPIDEMIA Hyperlipidemia status: excellent compliance Satisfied with current treatment?  yes Side effects:  no Medication compliance: excellent compliance Past cholesterol meds: simvastatin Supplements: none Aspirin:  no The 10-year ASCVD risk score Mikey Bussing DC Jr., et al., 2013) is: 1.5%   Values used to calculate the score:     Age: 89 years     Sex: Female     Is Non-Hispanic African American: No     Diabetic: No     Tobacco smoker: No     Systolic Blood Pressure: 354 mmHg     Is BP treated: No     HDL Cholesterol: 45 mg/dL     Total Cholesterol: 163 mg/dL Chest pain:  no Coronary artery disease:  no  Impaired Fasting Glucose HbA1C:  Lab Results  Component Value Date   HGBA1C 6.5 11/25/2018   Duration of elevated blood sugar: chronic Polydipsia: no Polyuria: no Weight change:  no Visual disturbance: no Glucose Monitoring: no Diabetic Education: Not Completed Family history of diabetes: yes   Relevant past medical, surgical, family and social history reviewed and updated as indicated. Interim medical history since our last visit reviewed. Allergies and medications reviewed and updated.  Review of Systems  Constitutional: Negative.   Respiratory: Negative.   Cardiovascular: Negative.   Musculoskeletal: Positive for arthralgias, back  pain, myalgias and neck pain. Negative for gait problem, joint swelling and neck stiffness.  Skin: Negative.   Neurological: Negative.   Psychiatric/Behavioral: Negative.     Per HPI unless specifically indicated above     Objective:    BP 107/79   Pulse 73   Temp 98.3 F (36.8 C) (Oral)   Ht '5\' 4"'  (1.626 m)   Wt 219 lb (99.3 kg)   SpO2 95%   BMI 37.59 kg/m   Wt Readings from Last 3 Encounters:  11/25/18 219 lb (99.3 kg)  06/24/18 220 lb 9.6 oz (100.1 kg)  03/21/18 220 lb (99.8 kg)    Physical Exam Vitals signs and nursing note reviewed.  Constitutional:      General: She is not in acute distress.    Appearance: Normal appearance. She is not ill-appearing, toxic-appearing or diaphoretic.  HENT:     Head: Normocephalic and atraumatic.     Right Ear: External ear normal.     Left Ear: External ear normal.     Nose: Nose normal.     Mouth/Throat:     Mouth: Mucous membranes are moist.     Pharynx: Oropharynx is clear.  Eyes:     General: No scleral icterus.       Right eye: No discharge.        Left eye: No discharge.     Extraocular Movements: Extraocular movements intact.     Conjunctiva/sclera: Conjunctivae normal.     Pupils: Pupils are equal, round, and reactive to light.  Neck:     Musculoskeletal: Normal range of motion and neck supple.  Cardiovascular:     Rate and Rhythm: Normal rate and regular rhythm.     Pulses: Normal pulses.     Heart sounds: Normal heart sounds. No murmur. No friction rub. No gallop.   Pulmonary:     Effort: Pulmonary effort is normal. No respiratory distress.     Breath sounds: Normal breath sounds. No stridor. No wheezing, rhonchi or rales.  Chest:     Chest wall: No tenderness.  Musculoskeletal: Normal range of motion.  Skin:    General: Skin is warm and dry.     Capillary Refill: Capillary refill takes less than 2 seconds.     Coloration: Skin is not jaundiced or pale.     Findings: No bruising, erythema, lesion or rash.   Neurological:     General: No focal deficit present.     Mental Status: She is alert and oriented to person, place, and time. Mental status is at baseline.  Psychiatric:        Mood and Affect: Mood normal.        Behavior: Behavior normal.        Thought Content: Thought content normal.        Judgment: Judgment normal.     Results for orders placed or performed in visit on 11/25/18  Bayer DCA Hb A1c Waived  Result Value Ref Range   HB A1C (BAYER DCA - WAIVED) 6.5 <7.0 %      Assessment & Plan:  Problem List Items Addressed This Visit      Endocrine   Thyroid activity decreased    Checking labs today. Await results. Call with any concerns. Continue to monitor.       Relevant Orders   CBC with Differential/Platelet   Comprehensive metabolic panel   TSH     Musculoskeletal and Integument   DJD (degenerative joint disease), cervical    Continue to follow with neurosurgery as needed. Call with any concerns.       Relevant Medications   HYDROcodone-acetaminophen (NORCO) 10-325 MG tablet   HYDROcodone-acetaminophen (NORCO) 10-325 MG tablet   HYDROcodone-acetaminophen (NORCO) 10-325 MG tablet (Start on 12/25/2018)   HYDROcodone-acetaminophen (NORCO) 10-325 MG tablet (Start on 01/24/2019)   Other Relevant Orders   CBC with Differential/Platelet   Comprehensive metabolic panel     Other   Hyperlipidemia    Under good control on current regimen. Continue current regimen. Continue to monitor. Call with any concerns. Refills given.        Relevant Medications   simvastatin (ZOCOR) 20 MG tablet   Other Relevant Orders   CBC with Differential/Platelet   Comprehensive metabolic panel   Lipid Panel w/o Chol/HDL Ratio   Bipolar 2 disorder (HCC)    Under good control on current regimen. Continue current regimen. Continue to monitor. Call with any concerns. Refills given.        Relevant Orders   CBC with Differential/Platelet   Comprehensive metabolic panel    Prediabetes    Checking labs today. Await results. Call with any concerns. Continue to monitor.       Relevant Orders   CBC with Differential/Platelet   Comprehensive metabolic panel   Bayer DCA Hb A1c Waived (Completed)   Chronic pain    Under good control on current regimen. Continue current regimen. Continue to monitor. Call with any concerns. Refills given for 3 months- should last about 6 months, if needing before follow up, will call and come in.       Relevant Medications   HYDROcodone-acetaminophen (NORCO) 10-325 MG tablet   FLUoxetine (PROZAC) 20 MG tablet   gabapentin (NEURONTIN) 600 MG tablet   HYDROcodone-acetaminophen (NORCO) 10-325 MG tablet   HYDROcodone-acetaminophen (NORCO) 10-325 MG tablet (Start on 12/25/2018)   HYDROcodone-acetaminophen (NORCO) 10-325 MG tablet (Start on 01/24/2019)    Other Visit Diagnoses    Arthritis    -  Primary   Will check labs, concerned about rheumatoid. Await result. Call with any concerns.    Relevant Medications   HYDROcodone-acetaminophen (NORCO) 10-325 MG tablet   HYDROcodone-acetaminophen (NORCO) 10-325 MG tablet   HYDROcodone-acetaminophen (NORCO) 10-325 MG tablet (Start on 12/25/2018)   HYDROcodone-acetaminophen (NORCO) 10-325 MG tablet (Start on 01/24/2019)   Other Relevant Orders   RA Qn+CCP(IgG/A)+SjoSSA+SjoSSB   Antinuclear Antib (ANA)   Sed Rate (ESR)   CK (Creatine Kinase)   CBC with Differential/Platelet   Comprehensive metabolic panel   VITAMIN D 25 Hydroxy (Vit-D Deficiency, Fractures)       Follow up plan: Return in about 7 months (around 06/27/2019) for Physical.

## 2018-11-27 ENCOUNTER — Other Ambulatory Visit: Payer: Self-pay | Admitting: Family Medicine

## 2018-11-27 DIAGNOSIS — R768 Other specified abnormal immunological findings in serum: Secondary | ICD-10-CM

## 2018-11-28 LAB — COMPREHENSIVE METABOLIC PANEL
ALT: 36 IU/L — ABNORMAL HIGH (ref 0–32)
AST: 26 IU/L (ref 0–40)
Albumin/Globulin Ratio: 1.7 (ref 1.2–2.2)
Albumin: 4.7 g/dL (ref 3.8–4.9)
Alkaline Phosphatase: 83 IU/L (ref 39–117)
BUN/Creatinine Ratio: 14 (ref 9–23)
BUN: 13 mg/dL (ref 6–24)
Bilirubin Total: 0.2 mg/dL (ref 0.0–1.2)
CO2: 21 mmol/L (ref 20–29)
Calcium: 9.6 mg/dL (ref 8.7–10.2)
Chloride: 103 mmol/L (ref 96–106)
Creatinine, Ser: 0.9 mg/dL (ref 0.57–1.00)
GFR calc Af Amer: 83 mL/min/{1.73_m2} (ref >59)
GFR calc non Af Amer: 72 mL/min/{1.73_m2} (ref >59)
Globulin, Total: 2.7 g/dL (ref 1.5–4.5)
Glucose: 132 mg/dL — ABNORMAL HIGH (ref 65–99)
Potassium: 4.5 mmol/L (ref 3.5–5.2)
Sodium: 137 mmol/L (ref 134–144)
Total Protein: 7.4 g/dL (ref 6.0–8.5)

## 2018-11-28 LAB — CBC WITH DIFFERENTIAL/PLATELET
Basophils Absolute: 0.1 10*3/uL (ref 0.0–0.2)
Basos: 1 %
EOS (ABSOLUTE): 0.4 10*3/uL (ref 0.0–0.4)
Eos: 5 %
Hematocrit: 41.6 % (ref 34.0–46.6)
Hemoglobin: 14 g/dL (ref 11.1–15.9)
Immature Grans (Abs): 0 10*3/uL (ref 0.0–0.1)
Immature Granulocytes: 0 %
Lymphocytes Absolute: 2 10*3/uL (ref 0.7–3.1)
Lymphs: 27 %
MCH: 32 pg (ref 26.6–33.0)
MCHC: 33.7 g/dL (ref 31.5–35.7)
MCV: 95 fL (ref 79–97)
Monocytes Absolute: 0.6 10*3/uL (ref 0.1–0.9)
Monocytes: 7 %
Neutrophils Absolute: 4.4 10*3/uL (ref 1.4–7.0)
Neutrophils: 60 %
Platelets: 253 10*3/uL (ref 150–450)
RBC: 4.38 x10E6/uL (ref 3.77–5.28)
RDW: 12.6 % (ref 11.7–15.4)
WBC: 7.4 10*3/uL (ref 3.4–10.8)

## 2018-11-28 LAB — TSH: TSH: 3.83 u[IU]/mL (ref 0.450–4.500)

## 2018-11-28 LAB — LIPID PANEL W/O CHOL/HDL RATIO
Cholesterol, Total: 160 mg/dL (ref 100–199)
HDL: 44 mg/dL (ref >39)
LDL Calculated: 89 mg/dL (ref 0–99)
Triglycerides: 133 mg/dL (ref 0–149)
VLDL Cholesterol Cal: 27 mg/dL (ref 5–40)

## 2018-11-28 LAB — CK: Total CK: 97 U/L (ref 32–182)

## 2018-11-28 LAB — RA QN+CCP(IGG/A)+SJOSSA+SJOSSB
Cyclic Citrullin Peptide Ab: 9 units (ref 0–19)
ENA SSA (RO) Ab: 0.7 AI (ref 0.0–0.9)
ENA SSB (LA) Ab: <0.2 AI (ref 0.0–0.9)
Rheumatoid fact SerPl-aCnc: 19.7 IU/mL — ABNORMAL HIGH (ref 0.0–13.9)

## 2018-11-28 LAB — SEDIMENTATION RATE: Sed Rate: 37 mm/hr (ref 0–40)

## 2018-11-28 LAB — ANA: Anti Nuclear Antibody (ANA): NEGATIVE

## 2018-11-28 LAB — VITAMIN D 25 HYDROXY (VIT D DEFICIENCY, FRACTURES): Vit D, 25-Hydroxy: 46.4 ng/mL (ref 30.0–100.0)

## 2018-12-23 DIAGNOSIS — R768 Other specified abnormal immunological findings in serum: Secondary | ICD-10-CM | POA: Insufficient documentation

## 2019-01-16 ENCOUNTER — Telehealth: Payer: PRIVATE HEALTH INSURANCE | Admitting: Physician Assistant

## 2019-01-16 DIAGNOSIS — B9789 Other viral agents as the cause of diseases classified elsewhere: Secondary | ICD-10-CM | POA: Diagnosis not present

## 2019-01-16 DIAGNOSIS — J019 Acute sinusitis, unspecified: Secondary | ICD-10-CM | POA: Diagnosis not present

## 2019-01-16 DIAGNOSIS — J029 Acute pharyngitis, unspecified: Secondary | ICD-10-CM | POA: Diagnosis not present

## 2019-01-16 MED ORDER — IPRATROPIUM BROMIDE 0.03 % NA SOLN: 2.0000 | Freq: Two times a day (BID) | NASAL | 0 refills | Status: DC

## 2019-01-16 NOTE — Progress Notes (Signed)
I have spent 5 minutes in review of e-visit questionnaire, review and updating patient chart, medical decision making and response to patient.   Jaymie Misch Cody Lillia Lengel, PA-C    

## 2019-01-16 NOTE — Progress Notes (Signed)

## 2019-01-20 ENCOUNTER — Encounter: Payer: Self-pay | Admitting: Physician Assistant

## 2019-01-20 MED ORDER — AMOXICILLIN 500 MG PO CAPS: 500.0000 mg | ORAL_CAPSULE | Freq: Two times a day (BID) | ORAL | 0 refills | Status: DC

## 2019-01-20 NOTE — Addendum Note (Signed)
Addended by: Waldon Merl on: 01/20/2019 03:45 PM   Modules accepted: Orders

## 2019-01-20 NOTE — Progress Notes (Signed)
We are sorry that you are not feeling well.  Here is how we plan to help!  Your symptoms indicate a likely viral infection (Pharyngitis).   Pharyngitis is inflammation in the back of the throat which can cause a sore throat, scratchiness and sometimes difficulty swallowing.   Pharyngitis is typically caused by a respiratory virus and will just run its course.  Please keep in mind that your symptoms could last up to 10 days.  For throat pain, we recommend over the counter oral pain relief medications such as acetaminophen or aspirin, or anti-inflammatory medications such as ibuprofen or naproxen sodium.  Topical treatments such as oral throat lozenges or sprays may be used as needed.  Avoid close contact with loved ones, especially the very young and elderly.  Remember to wash your hands thoroughly throughout the day as this is the number one way to prevent the spread of infection and wipe down door knobs and counters with disinfectant.  Since symptoms are persistent will prescribe antibiotic Amoxicillin 500 mg one pill twice daily for 10 days.  You may need formal testing in clinic or office to confirm if your symptoms continue or worsen.  Providers prescribe antibiotics to treat infections caused by bacteria. Antibiotics are very powerful in treating bacterial infections when they are used properly.  To maintain their effectiveness, they should be used only when necessary.  Overuse of antibiotics has resulted in the development of super bugs that are resistant to treatment!    Home Care:  Only take medications as instructed by your medical team.  Do not drink alcohol while taking these medications.  A steam or ultrasonic humidifier can help congestion.  You can place a towel over your head and breathe in the steam from hot water coming from a faucet.  Avoid close contacts especially the very young and the elderly.  Cover your mouth when you cough or sneeze.  Always remember to wash your  hands.  Get Help Right Away If:  You develop worsening fever or throat pain.  You develop a severe head ache or visual changes.  Your symptoms persist after you have completed your treatment plan.  Make sure you  Understand these instructions.  Will watch your condition.  Will get help right away if you are not doing well or get worse.  Your e-visit answers were reviewed by a board certified advanced clinical practitioner to complete your personal care plan.  Depending on the condition, your plan could have included both over the counter or prescription medications.  If there is a problem please reply  once you have received a response from your provider.  Your safety is important to [place].  If you have drug allergies check your prescription carefully.    You can use MyChart to ask questions about todays visit, request a non-urgent call back, or ask for a work or school excuse for 24 hours related to this e-Visit. If it has been greater than 24 hours you will need to follow up with your provider, or enter a new e-Visit to address those concerns.  You will get an e-mail in the next two days asking about your experience.  I hope that your e-visit has been valuable and will speed your recovery. Thank you for using e-visits.   I spent 5-10 minutes on review and completion of this note- Lacy Duverney [facility]

## 2019-01-21 DIAGNOSIS — M5020 Other cervical disc displacement, unspecified cervical region: Secondary | ICD-10-CM | POA: Insufficient documentation

## 2019-02-17 ENCOUNTER — Encounter: Payer: Self-pay | Admitting: Physician Assistant

## 2019-02-17 ENCOUNTER — Telehealth: Payer: PRIVATE HEALTH INSURANCE | Admitting: Physician Assistant

## 2019-02-17 DIAGNOSIS — J029 Acute pharyngitis, unspecified: Secondary | ICD-10-CM | POA: Diagnosis not present

## 2019-02-17 MED ORDER — CLINDAMYCIN HCL 300 MG PO CAPS: 300.0000 mg | ORAL_CAPSULE | Freq: Three times a day (TID) | ORAL | 0 refills | Status: DC

## 2019-02-17 NOTE — Progress Notes (Signed)
We are sorry that you are not feeling well.  Here is how we plan to help!  Your symptoms indicate a likely viral infection (Pharyngitis).   Pharyngitis is inflammation in the back of the throat which can cause a sore throat, scratchiness and sometimes difficulty swallowing.   Pharyngitis is typically caused by a respiratory virus and will just run its course.  Please keep in mind that your symptoms could last up to 10 days.  For throat pain, we recommend over the counter oral pain relief medications such as acetaminophen or aspirin, or anti-inflammatory medications such as ibuprofen or naproxen sodium.  Topical treatments such as oral throat lozenges or sprays may be used as needed.  Avoid close contact with loved ones, especially the very young and elderly.  Remember to wash your hands thoroughly throughout the day as this is the number one way to prevent the spread of infection and wipe down door knobs and counters with disinfectant.   I have prescribed Clindamycin 300 mg one pill three times daily for 10 days. Start this antibiotic only if your symptoms are persistent. Use of antibiotics can be associated with complications such as Cdiff diarrhea, and can contribute to antibiotic resistance.   Home Care:  Only take medications as instructed by your medical team.  Do not drink alcohol while taking these medications.  A steam or ultrasonic humidifier can help congestion.  You can place a towel over your head and breathe in the steam from hot water coming from a faucet.  Avoid close contacts especially the very young and the elderly.  Cover your mouth when you cough or sneeze.  Always remember to wash your hands.  Get Help Right Away If:  You develop worsening fever or throat pain.  You develop a severe head ache or visual changes.  Your symptoms persist after you have completed your treatment plan.  Make sure you  Understand these instructions.  Will watch your condition.  Will get  help right away if you are not doing well or get worse.  Your e-visit answers were reviewed by a board certified advanced clinical practitioner to complete your personal care plan.  Depending on the condition, your plan could have included both over the counter or prescription medications.  If there is a problem please reply  once you have received a response from your provider.  Your safety is important to [place].  If you have drug allergies check your prescription carefully.    You can use MyChart to ask questions about todays visit, request a non-urgent call back, or ask for a work or school excuse for 24 hours related to this e-Visit. If it has been greater than 24 hours you will need to follow up with your provider, or enter a new e-Visit to address those concerns.  You will get an e-mail in the next two days asking about your experience.  I hope that your e-visit has been valuable and will speed your recovery. Thank you for using e-visits.   I spent 5-10 minutes on review and completion of this note- Lacy Duverney [facility]

## 2019-02-26 ENCOUNTER — Encounter: Payer: Self-pay | Admitting: Family Medicine

## 2019-02-26 ENCOUNTER — Ambulatory Visit (INDEPENDENT_AMBULATORY_CARE_PROVIDER_SITE_OTHER): Payer: Managed Care, Other (non HMO) | Admitting: Family Medicine

## 2019-02-26 ENCOUNTER — Other Ambulatory Visit: Payer: Self-pay

## 2019-02-26 VITALS — BP 133/78 | HR 73

## 2019-02-26 DIAGNOSIS — E119 Type 2 diabetes mellitus without complications: Secondary | ICD-10-CM | POA: Diagnosis not present

## 2019-02-26 DIAGNOSIS — Z6835 Body mass index (BMI) 35.0-35.9, adult: Secondary | ICD-10-CM

## 2019-02-26 DIAGNOSIS — M26609 Unspecified temporomandibular joint disorder, unspecified side: Secondary | ICD-10-CM

## 2019-02-26 MED ORDER — CYCLOBENZAPRINE HCL 10 MG PO TABS: 10.0000 mg | ORAL_TABLET | Freq: Every day | ORAL | 0 refills | Status: DC

## 2019-02-26 NOTE — Progress Notes (Signed)
BP 133/78   Pulse 73    Subjective:    Patient ID: Carol Becker, female    DOB: Jul 10, 1961, 58 y.o.   MRN: [id number]  HPI: Carol Becker is a 58 y.o. female  Chief Complaint  Patient presents with  . Diabetes   DIABETES Hypoglycemic episodes:no Polydipsia/polyuria: no Visual disturbance: no Chest pain: no Paresthesias: no Glucose Monitoring: no  Accucheck frequency: Not Checking Taking Insulin?: no Blood Pressure Monitoring: not checking Retinal Examination: Not up to Date Foot Exam: Not up to Date Diabetic Education: Not Completed Pneumovax: Not up to Date Influenza: Up to Date Aspirin: no  Feels like she might have gained about 4lbs since the last time she was here. She would like to see a nutritionist to see if this will help.   TMJ has been getting really bad in the last 4 months. She is going to see her dentist and get a mouth guard, but she continues with a lot of pain in her jaw. Worse with eating. Worse when she wakes up in the AM. It is aching and sore in nature. Nothing makes it better. She is otherwise feeling well with no other concerns or complaints at this time.   Relevant past medical, surgical, family and social history reviewed and updated as indicated. Interim medical history since our last visit reviewed. Allergies and medications reviewed and updated.  Review of Systems  Constitutional: Negative.   Respiratory: Negative.   Cardiovascular: Negative.   Musculoskeletal: Positive for back pain, myalgias, neck pain and neck stiffness. Negative for arthralgias, gait problem and joint swelling.  Skin: Negative.   Neurological: Negative.   Psychiatric/Behavioral: Negative.     Per HPI unless specifically indicated above     Objective:    BP 133/78   Pulse 73   Wt Readings from Last 3 Encounters:  11/25/18 219 lb (99.3 kg)  06/24/18 220 lb 9.6 oz (100.1 kg)  03/21/18 220 lb (99.8 kg)    Physical Exam Vitals signs and nursing note  reviewed.  Constitutional:      General: She is not in acute distress.    Appearance: Normal appearance. She is not ill-appearing, toxic-appearing or diaphoretic.  HENT:     Head: Normocephalic and atraumatic.     Right Ear: External ear normal.     Left Ear: External ear normal.     Nose: Nose normal.     Mouth/Throat:     Mouth: Mucous membranes are moist.     Pharynx: Oropharynx is clear.  Eyes:     General: No scleral icterus.       Right eye: No discharge.        Left eye: No discharge.     Conjunctiva/sclera: Conjunctivae normal.     Pupils: Pupils are equal, round, and reactive to light.  Neck:     Musculoskeletal: Normal range of motion.  Pulmonary:     Effort: Pulmonary effort is normal. No respiratory distress.     Comments: Speaking in full sentences Musculoskeletal: Normal range of motion.  Skin:    Coloration: Skin is not jaundiced or pale.     Findings: No bruising, erythema, lesion or rash.  Neurological:     Mental Status: She is alert and oriented to person, place, and time. Mental status is at baseline.  Psychiatric:        Mood and Affect: Mood normal.        Behavior: Behavior normal.  Thought Content: Thought content normal.        Judgment: Judgment normal.     Results for orders placed or performed in visit on 11/25/18  RA Qn+CCP(IgG/A)+SjoSSA+SjoSSB  Result Value Ref Range   Rhuematoid fact SerPl-aCnc 19.7 (H) 0.0 - 13.9 IU/mL   ENA SSA (RO) Ab 0.7 0.0 - 0.9 AI   ENA SSB (LA) Ab <0.2 0.0 - 0.9 AI   Cyclic Citrullin Peptide Ab 9 0 - 19 units  Antinuclear Antib (ANA)  Result Value Ref Range   Anti Nuclear Antibody (ANA) Negative Negative  Sed Rate (ESR)  Result Value Ref Range   Sed Rate 37 0 - 40 mm/hr  CK (Creatine Kinase)  Result Value Ref Range   Total CK 97 32 - 182 U/L  CBC with Differential/Platelet  Result Value Ref Range   WBC 7.4 3.4 - 10.8 x10E3/uL   RBC 4.38 3.77 - 5.28 x10E6/uL   Hemoglobin 14.0 11.1 - 15.9 g/dL    Hematocrit 41.6 34.0 - 46.6 %   MCV 95 79 - 97 fL   MCH 32.0 26.6 - 33.0 pg   MCHC 33.7 31.5 - 35.7 g/dL   RDW 12.6 11.7 - 15.4 %   Platelets 253 150 - 450 x10E3/uL   Neutrophils 60 Not Estab. %   Lymphs 27 Not Estab. %   Monocytes 7 Not Estab. %   Eos 5 Not Estab. %   Basos 1 Not Estab. %   Neutrophils Absolute 4.4 1.4 - 7.0 x10E3/uL   Lymphocytes Absolute 2.0 0.7 - 3.1 x10E3/uL   Monocytes Absolute 0.6 0.1 - 0.9 x10E3/uL   EOS (ABSOLUTE) 0.4 0.0 - 0.4 x10E3/uL   Basophils Absolute 0.1 0.0 - 0.2 x10E3/uL   Immature Granulocytes 0 Not Estab. %   Immature Grans (Abs) 0.0 0.0 - 0.1 x10E3/uL  Comprehensive metabolic panel  Result Value Ref Range   Glucose 132 (H) 65 - 99 mg/dL   BUN 13 6 - 24 mg/dL   Creatinine, Ser 0.90 0.57 - 1.00 mg/dL   GFR calc non Af Amer 72 >59 mL/min/1.73   GFR calc Af Amer 83 >59 mL/min/1.73   BUN/Creatinine Ratio 14 9 - 23   Sodium 137 134 - 144 mmol/L   Potassium 4.5 3.5 - 5.2 mmol/L   Chloride 103 96 - 106 mmol/L   CO2 21 20 - 29 mmol/L   Calcium 9.6 8.7 - 10.2 mg/dL   Total Protein 7.4 6.0 - 8.5 g/dL   Albumin 4.7 3.8 - 4.9 g/dL   Globulin, Total 2.7 1.5 - 4.5 g/dL   Albumin/Globulin Ratio 1.7 1.2 - 2.2   Bilirubin Total 0.2 0.0 - 1.2 mg/dL   Alkaline Phosphatase 83 39 - 117 IU/L   AST 26 0 - 40 IU/L   ALT 36 (H) 0 - 32 IU/L  Lipid Panel w/o Chol/HDL Ratio  Result Value Ref Range   Cholesterol, Total 160 100 - 199 mg/dL   Triglycerides 133 0 - 149 mg/dL   HDL 44 >39 mg/dL   VLDL Cholesterol Cal 27 5 - 40 mg/dL   LDL Calculated 89 0 - 99 mg/dL  TSH  Result Value Ref Range   TSH 3.830 0.450 - 4.500 uIU/mL  VITAMIN D 25 Hydroxy (Vit-D Deficiency, Fractures)  Result Value Ref Range   Vit D, 25-Hydroxy 46.4 30.0 - 100.0 ng/mL  Bayer DCA Hb A1c Waived  Result Value Ref Range   HB A1C (BAYER DCA - WAIVED) 6.5 <7.0 %  Assessment & Plan:   Problem List Items Addressed This Visit      Endocrine   Diabetes mellitus without complication  ([place]) - Primary    A1c 6.5 last visit- will check labs and await results. Will get her into see the nutritionist. Call with any concerns. Continue to monitor.       Relevant Orders   Bayer DCA Hb A1c Waived   Amb ref to Medical Nutrition Therapy-MNT     Other   Class 2 severe obesity due to excess calories with serious comorbidity and body mass index (BMI) of 35.0 to 35.9 in adult [facility])    Referral to nutritionist made today. Call with any concerns.       Relevant Orders   Amb ref to Medical Nutrition Therapy-MNT    Other Visit Diagnoses    TMJ (temporomandibular joint syndrome)       Will treat with flexeril. She will see her dentist. Call with any concerns. Continue to monitor.       Follow up plan: Return in about 4 months (around 06/28/2019).   . This visit was completed via FaceTime due to the restrictions of the COVID-19 pandemic. All issues as above were discussed and addressed. Physical exam was done as above through visual confirmation on FaceTime. If it was felt that the patient should be evaluated in the office, they were directed there. The patient verbally consented to this visit. . Location of the patient: work . Location of the provider: home . Those involved with this call:  . Provider: Park Liter, DO . CMA: Tiffany Reel, CMA . Front Desk/Registration: Don Perking  . Time spent on call: 25  minutes with patient face to face via video conference. More than 50% of this time was spent in counseling and coordination of care. 40 minutes total spent in review of patient's record and preparation of their chart.

## 2019-02-26 NOTE — Patient Instructions (Signed)
[facility]  Address: [address], [place], [place] [address]  Phone: [phone]  

## 2019-03-02 ENCOUNTER — Encounter: Payer: Self-pay | Admitting: Family Medicine

## 2019-03-02 DIAGNOSIS — E1149 Type 2 diabetes mellitus with other diabetic neurological complication: Secondary | ICD-10-CM | POA: Insufficient documentation

## 2019-03-02 DIAGNOSIS — E119 Type 2 diabetes mellitus without complications: Secondary | ICD-10-CM | POA: Insufficient documentation

## 2019-03-02 NOTE — Assessment & Plan Note (Signed)
Referral to nutritionist made today. Call with any concerns.

## 2019-03-02 NOTE — Assessment & Plan Note (Signed)
A1c 6.5 last visit- will check labs and await results. Will get her into see the nutritionist. Call with any concerns. Continue to monitor.

## 2019-03-05 ENCOUNTER — Encounter: Payer: Self-pay | Admitting: Family Medicine

## 2019-03-05 MED ORDER — HYDROCODONE-ACETAMINOPHEN 10-325 MG PO TABS: 0.5000 | ORAL_TABLET | Freq: Two times a day (BID) | ORAL | 0 refills | Status: AC

## 2019-03-05 MED ORDER — HYDROCODONE-ACETAMINOPHEN 10-325 MG PO TABS: 0.5000 | ORAL_TABLET | Freq: Two times a day (BID) | ORAL | 0 refills | Status: DC

## 2019-03-12 ENCOUNTER — Other Ambulatory Visit: Payer: Self-pay

## 2019-03-12 ENCOUNTER — Other Ambulatory Visit: Payer: Managed Care, Other (non HMO)

## 2019-03-12 DIAGNOSIS — E119 Type 2 diabetes mellitus without complications: Secondary | ICD-10-CM

## 2019-03-12 LAB — BAYER DCA HB A1C WAIVED: HB A1C (BAYER DCA - WAIVED): 6.8 % (ref <7.0)

## 2019-03-13 ENCOUNTER — Encounter: Payer: Self-pay | Admitting: Family Medicine

## 2019-04-08 ENCOUNTER — Other Ambulatory Visit: Payer: Self-pay | Admitting: Family Medicine

## 2019-04-08 MED ORDER — CYCLOBENZAPRINE HCL 10 MG PO TABS: 10.0000 mg | ORAL_TABLET | Freq: Every day | ORAL | 3 refills | Status: DC

## 2019-04-08 NOTE — Telephone Encounter (Signed)
Routing to provider  

## 2019-05-24 ENCOUNTER — Other Ambulatory Visit: Payer: Self-pay | Admitting: Family Medicine

## 2019-06-23 NOTE — Progress Notes (Signed)
Pulse 77   Temp 98.8 F (37.1 C)   SpO2 97%    Subjective:    Patient ID: Carol Becker, female    DOB: Apr 20, 1961, 58 y.o.   MRN: [id number]  HPI: Carol Becker is a 58 y.o. female presenting on 06/24/2019 for comprehensive medical examination. Current medical complaints include:  Has not been doing really well. Her mom has dementia and had to put her in a disability. Dealing with the house and finances and medicaid and working full time.   CHRONIC PAIN  Present dose: 10-20 Morphine equivalents Pain control status: stable Duration: chronic Location: neck and arms Quality: aching and sore Current Pain Level: mild Previous Pain Level: moderate Breakthrough pain: yes Benefit from narcotic medications: yes What Activities task can be accomplished with current medication? Able to do her ADLs Interested in weaning off narcotics:no   Stool softners/OTC fiber: yes  Previous pain specialty evaluation: yes Non-narcotic analgesic meds: yes Narcotic contract: yes  DIABETES Hypoglycemic episodes:no Polydipsia/polyuria: no Visual disturbance: no Chest pain: no Paresthesias: no Glucose Monitoring: no  Accucheck frequency: Not Checking Taking Insulin?: no Blood Pressure Monitoring: not checking Retinal Examination: Up to Date Foot Exam: done today Diabetic Education: Not Completed Pneumovax: done today Influenza: Up to Date Aspirin: no  HYPOTHYROIDISM Thyroid control status:controlled Satisfied with current treatment? yes Medication side effects: no Medication compliance: excellent compliance Recent dose adjustment:no Fatigue: no Cold intolerance: no Heat intolerance: no Weight gain: no Weight loss: no Constipation: no Diarrhea/loose stools: no Palpitations: no Lower extremity edema: no Anxiety/depressed mood: yes  HYPERLIPIDEMIA Hyperlipidemia status: excellent compliance Satisfied with current treatment?  yes Side effects:  no Medication compliance:  excellent compliance Past cholesterol meds: simvastatin Supplements: none Aspirin:  no The 10-year ASCVD risk score Mikey Bussing DC Jr., et al., 2013) is: 6.3%   Values used to calculate the score:     Age: 63 years     Sex: Female     Is Non-Hispanic African American: No     Diabetic: Yes     Tobacco smoker: No     Systolic Blood Pressure: 563 mmHg     Is BP treated: No     HDL Cholesterol: 43 mg/dL     Total Cholesterol: 166 mg/dL Chest pain:  no  BIPOLAR Mood status: worse Satisfied with current treatment?: no Symptom severity: moderate  Duration of current treatment : chronic Side effects: no Medication compliance: excellent compliance Psychotherapy/counseling: no  Previous psychiatric medications: seroquel and fluoxetine Depressed mood: yes Anxious mood: yes Anhedonia: no Significant weight loss or gain: no Insomnia: yes  Fatigue: yes Feelings of worthlessness or guilt: yes Impaired concentration/indecisiveness: yes Suicidal ideations: no Hopelessness: no Crying spells: no Depression screen [facility] 2/9 06/29/2019 06/24/2018 03/21/2018 01/07/2018 06/21/2017  Decreased Interest 2 0 0 0 0  Down, Depressed, Hopeless 0 0 0 0 0  PHQ - 2 Score 2 0 0 0 0  Altered sleeping 2 0 0 2 -  Tired, decreased energy _0 -  Change in appetite 0 0 0 0 -  Feeling bad or failure about yourself  0 0 0 0 -  Trouble concentrating 1 0 0 1 -  Moving slowly or fidgety/restless 0 0 0 0 -  Suicidal thoughts 0 0 0 0 -  PHQ-9 Score _1 -  Difficult doing work/chores Somewhat difficult Not difficult at all Not difficult at all Somewhat difficult -   She currently lives with: husband Menopausal Symptoms: no  Depression Screen done today and results listed below:  Depression screen [facility] 2/9 06/29/2019 06/24/2018 03/21/2018 01/07/2018 06/21/2017  Decreased Interest 2 0 0 0 0  Down, Depressed, Hopeless 0 0 0 0 0  PHQ - 2 Score 2 0 0 0 0  Altered sleeping 2 0 0 2 -  Tired, decreased energy _0 -   Change in appetite 0 0 0 0 -  Feeling bad or failure about yourself  0 0 0 0 -  Trouble concentrating 1 0 0 1 -  Moving slowly or fidgety/restless 0 0 0 0 -  Suicidal thoughts 0 0 0 0 -  PHQ-9 Score _1 -  Difficult doing work/chores Somewhat difficult Not difficult at all Not difficult at all Somewhat difficult -    Past Medical History:  Past Medical History:  Diagnosis Date  . Bipolar 1 disorder ([place])   . Cervical spinal stenosis   . GERD (gastroesophageal reflux disease)   . Hyperlipidemia   . Osteopenia   . Sleep apnea   . Thyroid disease     Surgical History:  Past Surgical History:  Procedure Laterality Date  . ANTERIOR / POSTERIOR COMBINED FUSION CERVICAL SPINE    . KNEE ARTHROSCOPY WITH EXCISION BAKER'S CYST    . TUBAL LIGATION      Medications:  Current Outpatient Medications on File Prior to Visit  Medication Sig  . ondansetron (ZOFRAN ODT) 4 MG disintegrating tablet Take 1 tablet (4 mg total) by mouth every 8 (eight) hours as needed for nausea or vomiting.  Marland Kitchen scopolamine (TRANSDERM-SCOP, 1.5 MG,) 1 MG/3DAYS Place 1 patch (1.5 mg total) onto the skin every 3 (three) days.   No current facility-administered medications on file prior to visit.    Allergies:  Allergies  Allergen Reactions  . Codeine Phosphate [Codeine] Other (See Comments)    Jittery  . Crestor [Rosuvastatin Calcium] Other (See Comments)    Indigestion  . Rosuvastatin Calcium Other (See Comments)    Indigestion  . Sulfa Antibiotics Nausea Only  . Zithromax [Azithromycin] Other (See Comments)    Not effective    Social History:  Social History   Socioeconomic History  . Marital status: Married    Spouse name: Not on file  . Number of children: Not on file  . Years of education: Not on file  . Highest education level: Not on file  Occupational History  . Not on file  Tobacco Use  . Smoking status: Former Smoker    Quit date: 04/06/2011    Years since quitting: 8.2  .  Smokeless tobacco: Never Used  Substance and Sexual Activity  . Alcohol use: Yes    Comment: On Occasion  . Drug use: No  . Sexual activity: Yes    Birth control/protection: Post-menopausal  Other Topics Concern  . Not on file  Social History Narrative  . Not on file   Social Determinants of Health   Financial Resource Strain:   . Difficulty of Paying Living Expenses: Not on file  Food Insecurity:   . Worried About Charity fundraiser in the Last Year: Not on file  . Ran Out of Food in the Last Year: Not on file  Transportation Needs:   . Lack of Transportation (Medical): Not on file  . Lack of Transportation (Non-Medical): Not on file  Physical Activity:   . Days of Exercise per Week: Not on file  . Minutes of Exercise per Session: Not on file  Stress:   . Feeling of Stress : Not on file  Social Connections:   . Frequency of Communication with Friends and Family: Not on file  . Frequency of Social Gatherings with Friends and Family: Not on file  . Attends Religious Services: Not on file  . Active Member of Clubs or Organizations: Not on file  . Attends Archivist Meetings: Not on file  . Marital Status: Not on file  Intimate Partner Violence:   . Fear of Current or Ex-Partner: Not on file  . Emotionally Abused: Not on file  . Physically Abused: Not on file  . Sexually Abused: Not on file   Social History   Tobacco Use  Smoking Status Former Smoker  . Quit date: 04/06/2011  . Years since quitting: 8.2  Smokeless Tobacco Never Used   Social History   Substance and Sexual Activity  Alcohol Use Yes   Comment: On Occasion    Family History:  Family History  Problem Relation Age of Onset  . Heart disease Father        CAD  . Heart disease Sister 55       CAD  . Cancer Maternal Grandmother        breast  . Breast cancer Maternal Grandmother 71  . Cancer Paternal Grandmother        breast  . Breast cancer Paternal Grandmother 49  . Alzheimer's  disease Maternal Grandfather     Past medical history, surgical history, medications, allergies, family history and social history reviewed with patient today and changes made to appropriate areas of the chart.   Review of Systems  Constitutional: Negative.   HENT: Negative.   Eyes: Negative.   Respiratory: Negative.   Cardiovascular: Negative.   Gastrointestinal: Positive for abdominal pain and nausea. Negative for blood in stool, constipation, diarrhea, heartburn, melena and vomiting.  Genitourinary: Negative.   Musculoskeletal: Positive for joint pain, myalgias and neck pain. Negative for back pain and falls.  Skin: Negative.   Neurological: Positive for dizziness. Negative for tingling, tremors, sensory change, speech change, focal weakness, seizures, loss of consciousness, weakness and headaches.  Endo/Heme/Allergies: Negative.   Psychiatric/Behavioral: Positive for depression. Negative for hallucinations, memory loss, substance abuse and suicidal ideas. The patient is nervous/anxious. The patient does not have insomnia.     All other ROS negative except what is listed above and in the HPI.      Objective:    Pulse 77   Temp 98.8 F (37.1 C)   SpO2 97%   Wt Readings from Last 3 Encounters:  11/25/18 219 lb (99.3 kg)  06/24/18 220 lb 9.6 oz (100.1 kg)  03/21/18 220 lb (99.8 kg)    Physical Exam Vitals and nursing note reviewed.  Constitutional:      General: She is not in acute distress.    Appearance: Normal appearance. She is not ill-appearing, toxic-appearing or diaphoretic.  HENT:     Head: Normocephalic and atraumatic.     Right Ear: Tympanic membrane, ear canal and external ear normal. There is no impacted cerumen.     Left Ear: Tympanic membrane, ear canal and external ear normal. There is no impacted cerumen.     Nose: Nose normal. No congestion or rhinorrhea.     Mouth/Throat:     Mouth: Mucous membranes are moist.     Pharynx: Oropharynx is clear. No  oropharyngeal exudate or posterior oropharyngeal erythema.  Eyes:     General: No scleral icterus.  Right eye: No discharge.        Left eye: No discharge.     Extraocular Movements: Extraocular movements intact.     Conjunctiva/sclera: Conjunctivae normal.     Pupils: Pupils are equal, round, and reactive to light.  Neck:     Vascular: No carotid bruit.  Cardiovascular:     Rate and Rhythm: Normal rate and regular rhythm.     Pulses: Normal pulses.     Heart sounds: No murmur. No friction rub. No gallop.   Pulmonary:     Effort: Pulmonary effort is normal. No respiratory distress.     Breath sounds: Normal breath sounds. No stridor. No wheezing, rhonchi or rales.  Chest:     Chest wall: No tenderness.  Abdominal:     General: Abdomen is flat. Bowel sounds are normal. There is no distension.     Palpations: Abdomen is soft. There is no mass.     Tenderness: There is no abdominal tenderness. There is no right CVA tenderness, left CVA tenderness, guarding or rebound.     Hernia: No hernia is present.  Genitourinary:    Comments: Breast and pelvic exams deferred with shared decision making Musculoskeletal:        General: No swelling, tenderness, deformity or signs of injury.     Cervical back: Normal range of motion and neck supple. No rigidity. No muscular tenderness.     Right lower leg: No edema.     Left lower leg: No edema.  Lymphadenopathy:     Cervical: No cervical adenopathy.  Skin:    General: Skin is warm and dry.     Capillary Refill: Capillary refill takes less than 2 seconds.     Coloration: Skin is not jaundiced or pale.     Findings: No bruising, erythema, lesion or rash.  Neurological:     General: No focal deficit present.     Mental Status: She is alert and oriented to person, place, and time. Mental status is at baseline.     Cranial Nerves: No cranial nerve deficit.     Sensory: No sensory deficit.     Motor: No weakness.     Coordination:  Coordination normal.     Gait: Gait normal.     Deep Tendon Reflexes: Reflexes normal.  Psychiatric:        Mood and Affect: Mood normal.        Behavior: Behavior normal.        Thought Content: Thought content normal.        Judgment: Judgment normal.     Results for orders placed or performed in visit on 06/24/19  Microscopic Examination   URINE  Result Value Ref Range   WBC, UA 11-30 (A) 0 - 5 /hpf   RBC None seen 0 - 2 /hpf   Epithelial Cells (non renal) 0-10 0 - 10 /hpf   Bacteria, UA Few (A) None seen/Few  Urine Culture, Reflex   URINE  Result Value Ref Range   Urine Culture, Routine Final report    Organism ID, Bacteria Comment   Bayer DCA Hb A1c Waived  Result Value Ref Range   HB A1C (BAYER DCA - WAIVED) 6.4 <7.0 %  CBC with Differential OUT  Result Value Ref Range   WBC 5.6 3.4 - 10.8 x10E3/uL   RBC 4.29 3.77 - 5.28 x10E6/uL   Hemoglobin 13.7 11.1 - 15.9 g/dL   Hematocrit 41.3 34.0 - 46.6 %   MCV 96 79 -  97 fL   MCH 31.9 26.6 - 33.0 pg   MCHC 33.2 31.5 - 35.7 g/dL   RDW 12.7 11.7 - 15.4 %   Platelets 222 150 - 450 x10E3/uL   Neutrophils 57 Not Estab. %   Lymphs 30 Not Estab. %   Monocytes 8 Not Estab. %   Eos 4 Not Estab. %   Basos 1 Not Estab. %   Neutrophils Absolute 3.2 1.4 - 7.0 x10E3/uL   Lymphocytes Absolute 1.7 0.7 - 3.1 x10E3/uL   Monocytes Absolute 0.4 0.1 - 0.9 x10E3/uL   EOS (ABSOLUTE) 0.2 0.0 - 0.4 x10E3/uL   Basophils Absolute 0.0 0.0 - 0.2 x10E3/uL   Immature Granulocytes 0 Not Estab. %   Immature Grans (Abs) 0.0 0.0 - 0.1 x10E3/uL  Comp Met (CMET)  Result Value Ref Range   Glucose 175 (H) 65 - 99 mg/dL   BUN 16 6 - 24 mg/dL   Creatinine, Ser 0.92 0.57 - 1.00 mg/dL   GFR calc non Af Amer 69 >59 mL/min/1.73   GFR calc Af Amer 80 >59 mL/min/1.73   BUN/Creatinine Ratio 17 9 - 23   Sodium 138 134 - 144 mmol/L   Potassium 4.5 3.5 - 5.2 mmol/L   Chloride 102 96 - 106 mmol/L   CO2 23 20 - 29 mmol/L   Calcium 9.9 8.7 - 10.2 mg/dL   Total  Protein 6.9 6.0 - 8.5 g/dL   Albumin 4.3 3.8 - 4.9 g/dL   Globulin, Total 2.6 1.5 - 4.5 g/dL   Albumin/Globulin Ratio 1.7 1.2 - 2.2   Bilirubin Total 0.3 0.0 - 1.2 mg/dL   Alkaline Phosphatase 91 39 - 117 IU/L   AST 34 0 - 40 IU/L   ALT 54 (H) 0 - 32 IU/L  Lipid Panel w/o Chol/HDL Ratio OUT  Result Value Ref Range   Cholesterol, Total 166 100 - 199 mg/dL   Triglycerides 167 (H) 0 - 149 mg/dL   HDL 43 >39 mg/dL   VLDL Cholesterol Cal 29 5 - 40 mg/dL   LDL Chol Calc (NIH) 94 0 - 99 mg/dL  Microalbumin, Urine Waived  Result Value Ref Range   Microalb, Ur Waived 30 (H) 0 - 19 mg/L   Creatinine, Urine Waived 200 10 - 300 mg/dL   Microalb/Creat Ratio <30 <30 mg/g  TSH  Result Value Ref Range   TSH 1.370 0.450 - 4.500 uIU/mL  UA/M w/rflx Culture, Routine   Specimen: Urine   URINE  Result Value Ref Range   Specific Gravity, UA 1.025 1.005 - 1.030   pH, UA 6.0 5.0 - 7.5   Color, UA Yellow Yellow   Appearance Ur Clear Clear   Leukocytes,UA 1+ (A) Negative   Protein,UA Negative Negative/Trace   Glucose, UA Negative Negative   Ketones, UA Negative Negative   RBC, UA Negative Negative   Bilirubin, UA Negative Negative   Urobilinogen, Ur 0.2 0.2 - 1.0 mg/dL   Nitrite, UA Negative Negative   Microscopic Examination See below:    Urinalysis Reflex Comment       Assessment & Plan:   Problem List Items Addressed This Visit      Endocrine   Thyroid activity decreased    Labs drawn today. Await results. Treat as needed. Call with any concerns.        Relevant Orders   CBC with Differential OUT (Completed)   TSH (Completed)   Diabetes mellitus without complication ([place])    Doing well with A1c down to  6.4 from 6.8. Continue to monitor. Call with any concerns.      Relevant Medications   simvastatin (ZOCOR) 20 MG tablet   Other Relevant Orders   Bayer DCA Hb A1c Waived (Completed)   CBC with Differential OUT (Completed)   Comp Met (CMET) (Completed)   Microalbumin, Urine  Waived (Completed)     Other   Hyperlipidemia    Under good control on current regimen. Continue current regimen. Continue to monitor. Call with any concerns. Refills given. Labs drawn today.       Relevant Medications   simvastatin (ZOCOR) 20 MG tablet   Other Relevant Orders   CBC with Differential OUT (Completed)   Comp Met (CMET) (Completed)   Lipid Panel w/o Chol/HDL Ratio OUT (Completed)   Bipolar 2 disorder ([facility])    Not doing great. Will increase her prozac to 34m and recheck 1 month. Call with any concerns.       Relevant Orders   CBC with Differential OUT (Completed)   Chronic pain    Stable. Continue current regimen. Continue to monitor. Call with any concerns. Refills for 3 months given. Call with any concerns. Follow up on pain in 3 months.      Relevant Medications   HYDROcodone-acetaminophen (NORCO) 10-325 MG tablet   HYDROcodone-acetaminophen (NORCO) 10-325 MG tablet (Start on 07/24/2019)   HYDROcodone-acetaminophen (NORCO) 10-325 MG tablet (Start on 08/23/2019)   gabapentin (NEURONTIN) 600 MG tablet   FLUoxetine (PROZAC) 40 MG capsule   Other Relevant Orders   CBC with Differential OUT (Completed)    Other Visit Diagnoses    Routine general medical examination at a health care facility    -  Primary   Vaccines up to date. Screening labs checked today. Pap, mammogram and colonoscopy up to date. Continue to monitor. Call with any concerns.    Relevant Orders   Bayer DCA Hb A1c Waived (Completed)   CBC with Differential OUT (Completed)   Comp Met (CMET) (Completed)   Lipid Panel w/o Chol/HDL Ratio OUT (Completed)   Microalbumin, Urine Waived (Completed)   TSH (Completed)   UA/M w/rflx Culture, Routine (Completed)   Microscopic Examination (Completed)   Urine Culture, Reflex (Completed)   Immunization due       Flu shot and pneumovax given today.   Relevant Orders   Pneu 23 (Completed)   Flu Vaccine QUAD 6+ mos PF IM (Fluarix Quad PF) (Completed)    Screening for colon cancer       Referral generated today.   Relevant Orders   Ambulatory referral to Gastroenterology       Follow up plan: Return in about 4 weeks (around 07/22/2019).   LABORATORY TESTING:  - Pap smear: up to date  IMMUNIZATIONS:   - Tdap: Tetanus vaccination status reviewed: last tetanus booster within 10 years. - Influenza: Administered today - Pneumovax: Administered today  SCREENING: -Mammogram: Ordered today  - Colonoscopy: Ordered today   PATIENT COUNSELING:   Advised to take 1 mg of folate supplement per day if capable of pregnancy.   Sexuality: Discussed sexually transmitted diseases, partner selection, use of condoms, avoidance of unintended pregnancy  and contraceptive alternatives.   Advised to avoid cigarette smoking.  I discussed with the patient that most people either abstain from alcohol or drink within safe limits (<=14/week and <=4 drinks/occasion for males, <=7/weeks and <= 3 drinks/occasion for females) and that the risk for alcohol disorders and other health effects rises proportionally with the number of drinks per week  and how often a drinker exceeds daily limits.  Discussed cessation/primary prevention of drug use and availability of treatment for abuse.   Diet: Encouraged to adjust caloric intake to maintain  or achieve ideal body weight, to reduce intake of dietary saturated fat and total fat, to limit sodium intake by avoiding high sodium foods and not adding table salt, and to maintain adequate dietary potassium and calcium preferably from fresh fruits, vegetables, and low-fat dairy products.    stressed the importance of regular exercise  Injury prevention: Discussed safety belts, safety helmets, smoke detector, smoking near bedding or upholstery.   Dental health: Discussed importance of regular tooth brushing, flossing, and dental visits.    NEXT PREVENTATIVE PHYSICAL DUE IN 1 YEAR. Return in about 4 weeks (around  07/22/2019).

## 2019-06-24 ENCOUNTER — Ambulatory Visit (INDEPENDENT_AMBULATORY_CARE_PROVIDER_SITE_OTHER): Payer: Managed Care, Other (non HMO) | Admitting: Family Medicine

## 2019-06-24 ENCOUNTER — Other Ambulatory Visit: Payer: Self-pay

## 2019-06-24 ENCOUNTER — Encounter: Payer: Self-pay | Admitting: Family Medicine

## 2019-06-24 VITALS — HR 77 | Temp 98.8°F

## 2019-06-24 DIAGNOSIS — E039 Hypothyroidism, unspecified: Secondary | ICD-10-CM

## 2019-06-24 DIAGNOSIS — Z0000 Encounter for general adult medical examination without abnormal findings: Secondary | ICD-10-CM

## 2019-06-24 DIAGNOSIS — Z23 Encounter for immunization: Secondary | ICD-10-CM

## 2019-06-24 DIAGNOSIS — E119 Type 2 diabetes mellitus without complications: Secondary | ICD-10-CM

## 2019-06-24 DIAGNOSIS — F3181 Bipolar II disorder: Secondary | ICD-10-CM

## 2019-06-24 DIAGNOSIS — G894 Chronic pain syndrome: Secondary | ICD-10-CM

## 2019-06-24 DIAGNOSIS — Z1211 Encounter for screening for malignant neoplasm of colon: Secondary | ICD-10-CM

## 2019-06-24 DIAGNOSIS — E782 Mixed hyperlipidemia: Secondary | ICD-10-CM

## 2019-06-24 MED ORDER — HYDROCODONE-ACETAMINOPHEN 10-325 MG PO TABS: 0.5000 | ORAL_TABLET | Freq: Two times a day (BID) | ORAL | 0 refills | Status: DC | PRN

## 2019-06-24 MED ORDER — HYDROCODONE-ACETAMINOPHEN 10-325 MG PO TABS: 0.5000 | ORAL_TABLET | Freq: Two times a day (BID) | ORAL | 0 refills | Status: AC

## 2019-06-24 MED ORDER — HYDROCODONE-ACETAMINOPHEN 10-325 MG PO TABS: 0.5000 | ORAL_TABLET | Freq: Two times a day (BID) | ORAL | 0 refills | Status: AC | PRN

## 2019-06-25 LAB — COMPREHENSIVE METABOLIC PANEL
ALT: 54 IU/L — ABNORMAL HIGH (ref 0–32)
AST: 34 IU/L (ref 0–40)
Albumin/Globulin Ratio: 1.7 (ref 1.2–2.2)
Albumin: 4.3 g/dL (ref 3.8–4.9)
Alkaline Phosphatase: 91 IU/L (ref 39–117)
BUN/Creatinine Ratio: 17 (ref 9–23)
BUN: 16 mg/dL (ref 6–24)
Bilirubin Total: 0.3 mg/dL (ref 0.0–1.2)
CO2: 23 mmol/L (ref 20–29)
Calcium: 9.9 mg/dL (ref 8.7–10.2)
Chloride: 102 mmol/L (ref 96–106)
Creatinine, Ser: 0.92 mg/dL (ref 0.57–1.00)
GFR calc Af Amer: 80 mL/min/{1.73_m2} (ref >59)
GFR calc non Af Amer: 69 mL/min/{1.73_m2} (ref >59)
Globulin, Total: 2.6 g/dL (ref 1.5–4.5)
Glucose: 175 mg/dL — ABNORMAL HIGH (ref 65–99)
Potassium: 4.5 mmol/L (ref 3.5–5.2)
Sodium: 138 mmol/L (ref 134–144)
Total Protein: 6.9 g/dL (ref 6.0–8.5)

## 2019-06-25 LAB — CBC WITH DIFFERENTIAL/PLATELET
Basophils Absolute: 0 10*3/uL (ref 0.0–0.2)
Basos: 1 %
EOS (ABSOLUTE): 0.2 10*3/uL (ref 0.0–0.4)
Eos: 4 %
Hematocrit: 41.3 % (ref 34.0–46.6)
Hemoglobin: 13.7 g/dL (ref 11.1–15.9)
Immature Grans (Abs): 0 10*3/uL (ref 0.0–0.1)
Immature Granulocytes: 0 %
Lymphocytes Absolute: 1.7 10*3/uL (ref 0.7–3.1)
Lymphs: 30 %
MCH: 31.9 pg (ref 26.6–33.0)
MCHC: 33.2 g/dL (ref 31.5–35.7)
MCV: 96 fL (ref 79–97)
Monocytes Absolute: 0.4 10*3/uL (ref 0.1–0.9)
Monocytes: 8 %
Neutrophils Absolute: 3.2 10*3/uL (ref 1.4–7.0)
Neutrophils: 57 %
Platelets: 222 10*3/uL (ref 150–450)
RBC: 4.29 x10E6/uL (ref 3.77–5.28)
RDW: 12.7 % (ref 11.7–15.4)
WBC: 5.6 10*3/uL (ref 3.4–10.8)

## 2019-06-25 LAB — LIPID PANEL W/O CHOL/HDL RATIO
Cholesterol, Total: 166 mg/dL (ref 100–199)
HDL: 43 mg/dL (ref >39)
LDL Chol Calc (NIH): 94 mg/dL (ref 0–99)
Triglycerides: 167 mg/dL — ABNORMAL HIGH (ref 0–149)
VLDL Cholesterol Cal: 29 mg/dL (ref 5–40)

## 2019-06-25 LAB — TSH: TSH: 1.37 u[IU]/mL (ref 0.450–4.500)

## 2019-06-26 ENCOUNTER — Telehealth: Payer: Self-pay

## 2019-06-26 ENCOUNTER — Other Ambulatory Visit: Payer: Self-pay

## 2019-06-26 DIAGNOSIS — Z1211 Encounter for screening for malignant neoplasm of colon: Secondary | ICD-10-CM

## 2019-06-26 LAB — MICROSCOPIC EXAMINATION: RBC, Urine: NONE SEEN /hpf (ref 0–2)

## 2019-06-26 LAB — UA/M W/RFLX CULTURE, ROUTINE
Bilirubin, UA: NEGATIVE
Glucose, UA: NEGATIVE
Ketones, UA: NEGATIVE
Nitrite, UA: NEGATIVE
Protein,UA: NEGATIVE
RBC, UA: NEGATIVE
Specific Gravity, UA: 1.025 (ref 1.005–1.030)
Urobilinogen, Ur: 0.2 mg/dL (ref 0.2–1.0)
pH, UA: 6 (ref 5.0–7.5)

## 2019-06-26 LAB — MICROALBUMIN, URINE WAIVED
Creatinine, Urine Waived: 200 mg/dL (ref 10–300)
Microalb, Ur Waived: 30 mg/L — ABNORMAL HIGH (ref 0–19)
Microalb/Creat Ratio: <30 mg/g (ref <30)

## 2019-06-26 LAB — BAYER DCA HB A1C WAIVED: HB A1C (BAYER DCA - WAIVED): 6.4 % (ref <7.0)

## 2019-06-26 LAB — URINE CULTURE, REFLEX

## 2019-06-26 NOTE — Telephone Encounter (Signed)
Gastroenterology Pre-Procedure Review  Request Date: 07/18/19 Requesting Physician: Dr. Vicente Males  PATIENT REVIEW QUESTIONS: The patient responded to the following health history questions as indicated:    1. Are you having any GI issues? no 2. Do you have a personal history of Polyps? yes (unsure of the year) 3. Do you have a family history of Colon Cancer or Polyps? no 4. Diabetes Mellitus? no 5. Joint replacements in the past 12 months?no 6. Major health problems in the past 3 months?no 7. Any artificial heart valves, MVP, or defibrillator?no    MEDICATIONS & ALLERGIES:    Patient reports the following regarding taking any anticoagulation/antiplatelet therapy:   Plavix, Coumadin, Eliquis, Xarelto, Lovenox, Pradaxa, Brilinta, or Effient? no Aspirin? no  Patient confirms/reports the following medications:  Current Outpatient Medications  Medication Sig Dispense Refill  . FLUoxetine (PROZAC) 20 MG tablet TAKE 1 TABLET(20 MG) BY MOUTH DAILY 90 tablet 0  . gabapentin (NEURONTIN) 600 MG tablet Take 1 tablet (600 mg total) by mouth 2 (two) times daily. 180 tablet 1  . HYDROcodone-acetaminophen (NORCO) 10-325 MG tablet Take 0.5-1 tablets by mouth 2 (two) times daily. 60 tablet 0  . [START ON 07/24/2019] HYDROcodone-acetaminophen (NORCO) 10-325 MG tablet Take 0.5-1 tablets by mouth 2 (two) times daily as needed. 60 tablet 0  . [START ON 08/23/2019] HYDROcodone-acetaminophen (NORCO) 10-325 MG tablet Take 0.5-1 tablets by mouth 2 (two) times daily as needed. 60 tablet 0  . omeprazole (PRILOSEC) 20 MG capsule TAKE 1 CAPSULE(20 MG) BY MOUTH DAILY 90 capsule 3  . ondansetron (ZOFRAN ODT) 4 MG disintegrating tablet Take 1 tablet (4 mg total) by mouth every 8 (eight) hours as needed for nausea or vomiting. 45 tablet 6  . QUEtiapine (SEROQUEL) 100 MG tablet TAKE 1 AND 1/2 TABLETS(150 MG) BY MOUTH AT BEDTIME 135 tablet 1  . scopolamine (TRANSDERM-SCOP, 1.5 MG,) 1 MG/3DAYS Place 1 patch (1.5 mg total) onto  the skin every 3 (three) days. 10 patch 12  . simvastatin (ZOCOR) 20 MG tablet TAKE 1 TABLET(20 MG) BY MOUTH DAILY 90 tablet 1   No current facility-administered medications for this visit.    Patient confirms/reports the following allergies:  Allergies  Allergen Reactions  . Codeine Phosphate [Codeine] Other (See Comments)    Jittery  . Crestor [Rosuvastatin Calcium] Other (See Comments)    Indigestion  . Rosuvastatin Calcium Other (See Comments)    Indigestion  . Sulfa Antibiotics Nausea Only  . Zithromax [Azithromycin] Other (See Comments)    Not effective    No orders of the defined types were placed in this encounter.   AUTHORIZATION INFORMATION Primary Insurance: 1D#: Group #:  Secondary Insurance: 1D#: Group #:  SCHEDULE INFORMATION: Date:07/18/19 Time: Location:[facility]

## 2019-06-28 ENCOUNTER — Other Ambulatory Visit: Payer: Self-pay | Admitting: Family Medicine

## 2019-06-29 ENCOUNTER — Encounter: Payer: Self-pay | Admitting: Family Medicine

## 2019-06-29 MED ORDER — GABAPENTIN 600 MG PO TABS: 600.0000 mg | ORAL_TABLET | Freq: Two times a day (BID) | ORAL | 1 refills | Status: DC

## 2019-06-29 MED ORDER — QUETIAPINE FUMARATE 100 MG PO TABS: ORAL_TABLET | ORAL | 1 refills | Status: DC

## 2019-06-29 MED ORDER — SIMVASTATIN 20 MG PO TABS: ORAL_TABLET | ORAL | 1 refills | Status: DC

## 2019-06-29 MED ORDER — FLUOXETINE HCL 40 MG PO CAPS: 40.0000 mg | ORAL_CAPSULE | Freq: Every day | ORAL | 1 refills | Status: DC

## 2019-06-29 NOTE — Assessment & Plan Note (Signed)
Not doing great. Will increase her prozac to 40mg  and recheck 1 month. Call with any concerns.

## 2019-06-29 NOTE — Assessment & Plan Note (Signed)
Doing well with A1c down to 6.4 from 6.8. Continue to monitor. Call with any concerns.

## 2019-06-29 NOTE — Assessment & Plan Note (Signed)
Under good control on current regimen. Continue current regimen. Continue to monitor. Call with any concerns. Refills given. Labs drawn today.   

## 2019-06-29 NOTE — Patient Instructions (Signed)
Health Maintenance, Female Adopting a healthy lifestyle and getting preventive care are important in promoting health and wellness. Ask your health care provider about:  The right schedule for you to have regular tests and exams.  Things you can do on your own to prevent diseases and keep yourself healthy. What should I know about diet, weight, and exercise? Eat a healthy diet   Eat a diet that includes plenty of vegetables, fruits, low-fat dairy products, and lean protein.  Do not eat a lot of foods that are high in solid fats, added sugars, or sodium. Maintain a healthy weight Body mass index (BMI) is used to identify weight problems. It estimates body fat based on height and weight. Your health care provider can help determine your BMI and help you achieve or maintain a healthy weight. Get regular exercise Get regular exercise. This is one of the most important things you can do for your health. Most adults should:  Exercise for at least 150 minutes each week. The exercise should increase your heart rate and make you sweat (moderate-intensity exercise).  Do strengthening exercises at least twice a week. This is in addition to the moderate-intensity exercise.  Spend less time sitting. Even light physical activity can be beneficial. Watch cholesterol and blood lipids Have your blood tested for lipids and cholesterol at 57 years of age, then have this test every 5 years. Have your cholesterol levels checked more often if:  Your lipid or cholesterol levels are high.  You are older than 57 years of age.  You are at high risk for heart disease. What should I know about cancer screening? Depending on your health history and family history, you may need to have cancer screening at various ages. This may include screening for:  Breast cancer.  Cervical cancer.  Colorectal cancer.  Skin cancer.  Lung cancer. What should I know about heart disease, diabetes, and high blood  pressure? Blood pressure and heart disease  High blood pressure causes heart disease and increases the risk of stroke. This is more likely to develop in people who have high blood pressure readings, are of African descent, or are overweight.  Have your blood pressure checked: ? Every 3-5 years if you are 18-39 years of age. ? Every year if you are 40 years old or older. Diabetes Have regular diabetes screenings. This checks your fasting blood sugar level. Have the screening done:  Once every three years after age 40 if you are at a normal weight and have a low risk for diabetes.  More often and at a younger age if you are overweight or have a high risk for diabetes. What should I know about preventing infection? Hepatitis B If you have a higher risk for hepatitis B, you should be screened for this virus. Talk with your health care provider to find out if you are at risk for hepatitis B infection. Hepatitis C Testing is recommended for:  Everyone born from 1945 through 1965.  Anyone with known risk factors for hepatitis C. Sexually transmitted infections (STIs)  Get screened for STIs, including gonorrhea and chlamydia, if: ? You are sexually active and are younger than 57 years of age. ? You are older than 57 years of age and your health care provider tells you that you are at risk for this type of infection. ? Your sexual activity has changed since you were last screened, and you are at increased risk for chlamydia or gonorrhea. Ask your health care provider if   you are at risk.  Ask your health care provider about whether you are at high risk for HIV. Your health care provider may recommend a prescription medicine to help prevent HIV infection. If you choose to take medicine to prevent HIV, you should first get tested for HIV. You should then be tested every 3 months for as long as you are taking the medicine. Pregnancy  If you are about to stop having your period (premenopausal) and  you may become pregnant, seek counseling before you get pregnant.  Take 400 to 800 micrograms (mcg) of folic acid every day if you become pregnant.  Ask for birth control (contraception) if you want to prevent pregnancy. Osteoporosis and menopause Osteoporosis is a disease in which the bones lose minerals and strength with aging. This can result in bone fractures. If you are 65 years old or older, or if you are at risk for osteoporosis and fractures, ask your health care provider if you should:  Be screened for bone loss.  Take a calcium or vitamin D supplement to lower your risk of fractures.  Be given hormone replacement therapy (HRT) to treat symptoms of menopause. Follow these instructions at home: Lifestyle  Do not use any products that contain nicotine or tobacco, such as cigarettes, e-cigarettes, and chewing tobacco. If you need help quitting, ask your health care provider.  Do not use street drugs.  Do not share needles.  Ask your health care provider for help if you need support or information about quitting drugs. Alcohol use  Do not drink alcohol if: ? Your health care provider tells you not to drink. ? You are pregnant, may be pregnant, or are planning to become pregnant.  If you drink alcohol: ? Limit how much you use to 0-1 drink a day. ? Limit intake if you are breastfeeding.  Be aware of how much alcohol is in your drink. In the [place] one drink equals one 12 oz bottle of beer (355 mL), one 5 oz glass of wine (148 mL), or one 1 oz glass of hard liquor (44 mL). General instructions  Schedule regular health, dental, and eye exams.  Stay current with your vaccines.  Tell your health care provider if: ? You often feel depressed. ? You have ever been abused or do not feel safe at home. Summary  Adopting a healthy lifestyle and getting preventive care are important in promoting health and wellness.  Follow your health care provider's instructions about healthy  diet, exercising, and getting tested or screened for diseases.  Follow your health care provider's instructions on monitoring your cholesterol and blood pressure. This information is not intended to replace advice given to you by your health care provider. Make sure you discuss any questions you have with your health care provider. Document Released: 01/09/2011 Document Revised: 06/19/2018 Document Reviewed: 06/19/2018 Elsevier Patient Education  2020 [employer]  

## 2019-06-29 NOTE — Assessment & Plan Note (Signed)
Labs drawn today. Await results. Treat as needed. Call with any concerns.

## 2019-06-29 NOTE — Assessment & Plan Note (Signed)
Stable. Continue current regimen. Continue to monitor. Call with any concerns. Refills for 3 months given. Call with any concerns. Follow up on pain in 3 months.

## 2019-07-07 ENCOUNTER — Telehealth: Payer: Self-pay

## 2019-07-07 NOTE — Telephone Encounter (Signed)
Patient LVM to call her in regards to canceling her colonoscopy 07/18/19 Dr. Jhonnie Garner.  Returned patients call LVM for her to call office back to reschedule.  Thanks [employer]

## 2019-07-08 ENCOUNTER — Telehealth: Payer: Self-pay

## 2019-07-08 NOTE — Telephone Encounter (Signed)
Patient LVM to cancel her colonoscopy scheduled for 07/18/19.  I returned call she states that she does not want to reschedule at this time.  She will call back later on in the year to schedule.  Thanks [employer]

## 2019-07-18 ENCOUNTER — Ambulatory Visit: Admit: 2019-07-18 | Payer: Managed Care, Other (non HMO) | Admitting: Gastroenterology

## 2019-07-18 SURGERY — COLONOSCOPY WITH PROPOFOL
Anesthesia: General

## 2019-09-26 ENCOUNTER — Encounter: Payer: Self-pay | Admitting: Family Medicine

## 2019-09-30 ENCOUNTER — Encounter: Payer: Self-pay | Admitting: Family Medicine

## 2019-09-30 ENCOUNTER — Telehealth (INDEPENDENT_AMBULATORY_CARE_PROVIDER_SITE_OTHER): Payer: Managed Care, Other (non HMO) | Admitting: Family Medicine

## 2019-09-30 DIAGNOSIS — G894 Chronic pain syndrome: Secondary | ICD-10-CM

## 2019-09-30 DIAGNOSIS — F3181 Bipolar II disorder: Secondary | ICD-10-CM | POA: Diagnosis not present

## 2019-09-30 DIAGNOSIS — K219 Gastro-esophageal reflux disease without esophagitis: Secondary | ICD-10-CM | POA: Diagnosis not present

## 2019-09-30 MED ORDER — FLUOXETINE HCL 20 MG PO CAPS: 20.0000 mg | ORAL_CAPSULE | Freq: Every day | ORAL | 1 refills | Status: DC

## 2019-09-30 MED ORDER — HYDROCODONE-ACETAMINOPHEN 10-325 MG PO TABS: 0.5000 | ORAL_TABLET | Freq: Two times a day (BID) | ORAL | 0 refills | Status: AC | PRN

## 2019-09-30 MED ORDER — HYDROCODONE-ACETAMINOPHEN 10-325 MG PO TABS: 1.0000 | ORAL_TABLET | Freq: Two times a day (BID) | ORAL | 0 refills | Status: AC | PRN

## 2019-09-30 MED ORDER — HYDROCODONE-ACETAMINOPHEN 10-325 MG PO TABS: 1.0000 | ORAL_TABLET | Freq: Two times a day (BID) | ORAL | 0 refills | Status: DC | PRN

## 2019-09-30 NOTE — Progress Notes (Signed)
There were no vitals taken for this visit.   Subjective:    Patient ID: Carol Becker, female    DOB: 05/12/1961, 59 y.o.   MRN: [id number]  HPI: Carol Becker is a 59 y.o. female  Chief Complaint  Patient presents with  . Pain  . Depression  . Gastroesophageal Reflux   CHRONIC PAIN  Present dose: 20 Morphine equivalents Pain control status: stable Duration: chronic Location: neck, back Quality: aching and sore Current Pain Level: moderate Previous Pain Level: moderate Breakthrough pain: yes Benefit from narcotic medications: yes What Activities task can be accomplished with current medication? Able to do her ADLs Interested in weaning off narcotics:no   Stool softners/OTC fiber: yes  Previous pain specialty evaluation: no Non-narcotic analgesic meds: yes Narcotic contract: yes  DEPRESSION- didn't notice a significant difference with the 88m, feeling about the same Mood status: better Satisfied with current treatment?: yes Symptom severity: mild  Duration of current treatment : months Side effects: no Medication compliance: excellent compliance Psychotherapy/counseling: no  Previous psychiatric medications: seroquel, fluoxetine Depressed mood: yes Anxious mood: yes Anhedonia: no Significant weight loss or gain: no Insomnia: no  Fatigue: yes Feelings of worthlessness or guilt: no Impaired concentration/indecisiveness: no Suicidal ideations: no Hopelessness: no Crying spells: no [facility] 09/30/2019 06/29/2019 06/24/2018 03/21/2018 01/07/2018  Decreased Interest 0 2 0 0 0  Down, Depressed, Hopeless 0 0 0 0 0  PHQ - 2 Score 0 2 0 0 0  Altered sleeping 0 2 0 0 2  Tired, decreased energy '2 2 1 3 2  ' Change in appetite 0 0 0 0 0  Feeling bad or failure about yourself  0 0 0 0 0  Trouble concentrating 0 1 0 0 1  Moving slowly or fidgety/restless 0 0 0 0 0  Suicidal thoughts 0 0 0 0 0  PHQ-9 Score '2 7 1 3 5  ' Difficult doing work/chores Not  difficult at all Somewhat difficult Not difficult at all Not difficult at all Somewhat difficult   GAD 7 : Generalized Anxiety Score 09/30/2019 06/29/2019  Nervous, Anxious, on Edge 0 3  Control/stop worrying 0 2  Worry too much - different things 0 2  Trouble relaxing 0 3  Restless 0 2  Easily annoyed or irritable 0 3  Afraid - awful might happen 0 0  Total GAD 7 Score 0 15  Anxiety Difficulty Not difficult at all Somewhat difficult    GERD GERD control status: exacerbated  Satisfied with current treatment? no Heartburn frequency: much more often Medication side effects: no  Medication compliance: excellent Previous GERD medications: omeprazole Dysphagia: no Odynophagia:  no Hematemesis: no Blood in stool: no EGD: no   Relevant past medical, surgical, family and social history reviewed and updated as indicated. Interim medical history since our last visit reviewed. Allergies and medications reviewed and updated.  Review of Systems  Constitutional: Negative.   Respiratory: Negative.   Cardiovascular: Negative.   Gastrointestinal: Negative.   Musculoskeletal: Positive for arthralgias, back pain and myalgias. Negative for gait problem, joint swelling, neck pain and neck stiffness.  Skin: Negative.   Psychiatric/Behavioral: Negative.    Per HPI unless specifically indicated above     Objective:    There were no vitals taken for this visit.  Wt Readings from Last 3 Encounters:  11/25/18 219 lb (99.3 kg)  06/24/18 220 lb 9.6 oz (100.1 kg)  03/21/18 220 lb (99.8 kg)    Physical Exam Vitals and nursing  note reviewed.  Constitutional:      General: She is not in acute distress.    Appearance: Normal appearance. She is not ill-appearing, toxic-appearing or diaphoretic.  HENT:     Head: Normocephalic and atraumatic.     Right Ear: External ear normal.     Left Ear: External ear normal.     Nose: Nose normal.     Mouth/Throat:     Mouth: Mucous membranes are moist.       Pharynx: Oropharynx is clear.  Eyes:     General: No scleral icterus.       Right eye: No discharge.        Left eye: No discharge.     Conjunctiva/sclera: Conjunctivae normal.     Pupils: Pupils are equal, round, and reactive to light.  Pulmonary:     Effort: Pulmonary effort is normal. No respiratory distress.     Comments: Speaking in full sentences Musculoskeletal:        General: Normal range of motion.     Cervical back: Normal range of motion.  Skin:    Coloration: Skin is not jaundiced or pale.     Findings: No bruising, erythema, lesion or rash.  Neurological:     Mental Status: She is alert and oriented to person, place, and time. Mental status is at baseline.  Psychiatric:        Mood and Affect: Mood normal.        Behavior: Behavior normal.        Thought Content: Thought content normal.        Judgment: Judgment normal.     Results for orders placed or performed in visit on 06/24/19  Microscopic Examination   URINE  Result Value Ref Range   WBC, UA 11-30 (A) 0 - 5 /hpf   RBC None seen 0 - 2 /hpf   Epithelial Cells (non renal) 0-10 0 - 10 /hpf   Bacteria, UA Few (A) None seen/Few  Urine Culture, Reflex   URINE  Result Value Ref Range   Urine Culture, Routine Final report    Organism ID, Bacteria Comment   Bayer DCA Hb A1c Waived  Result Value Ref Range   HB A1C (BAYER DCA - WAIVED) 6.4 <7.0 %  CBC with Differential OUT  Result Value Ref Range   WBC 5.6 3.4 - 10.8 x10E3/uL   RBC 4.29 3.77 - 5.28 x10E6/uL   Hemoglobin 13.7 11.1 - 15.9 g/dL   Hematocrit 41.3 34.0 - 46.6 %   MCV 96 79 - 97 fL   MCH 31.9 26.6 - 33.0 pg   MCHC 33.2 31.5 - 35.7 g/dL   RDW 12.7 11.7 - 15.4 %   Platelets 222 150 - 450 x10E3/uL   Neutrophils 57 Not Estab. %   Lymphs 30 Not Estab. %   Monocytes 8 Not Estab. %   Eos 4 Not Estab. %   Basos 1 Not Estab. %   Neutrophils Absolute 3.2 1.4 - 7.0 x10E3/uL   Lymphocytes Absolute 1.7 0.7 - 3.1 x10E3/uL   Monocytes Absolute 0.4  0.1 - 0.9 x10E3/uL   EOS (ABSOLUTE) 0.2 0.0 - 0.4 x10E3/uL   Basophils Absolute 0.0 0.0 - 0.2 x10E3/uL   Immature Granulocytes 0 Not Estab. %   Immature Grans (Abs) 0.0 0.0 - 0.1 x10E3/uL  Comp Met (CMET)  Result Value Ref Range   Glucose 175 (H) 65 - 99 mg/dL   BUN 16 6 - 24 mg/dL   Creatinine, Ser  0.92 0.57 - 1.00 mg/dL   GFR calc non Af Amer 69 >59 mL/min/1.73   GFR calc Af Amer 80 >59 mL/min/1.73   BUN/Creatinine Ratio 17 9 - 23   Sodium 138 134 - 144 mmol/L   Potassium 4.5 3.5 - 5.2 mmol/L   Chloride 102 96 - 106 mmol/L   CO2 23 20 - 29 mmol/L   Calcium 9.9 8.7 - 10.2 mg/dL   Total Protein 6.9 6.0 - 8.5 g/dL   Albumin 4.3 3.8 - 4.9 g/dL   Globulin, Total 2.6 1.5 - 4.5 g/dL   Albumin/Globulin Ratio 1.7 1.2 - 2.2   Bilirubin Total 0.3 0.0 - 1.2 mg/dL   Alkaline Phosphatase 91 39 - 117 IU/L   AST 34 0 - 40 IU/L   ALT 54 (H) 0 - 32 IU/L  Lipid Panel w/o Chol/HDL Ratio OUT  Result Value Ref Range   Cholesterol, Total 166 100 - 199 mg/dL   Triglycerides 167 (H) 0 - 149 mg/dL   HDL 43 >39 mg/dL   VLDL Cholesterol Cal 29 5 - 40 mg/dL   LDL Chol Calc (NIH) 94 0 - 99 mg/dL  Microalbumin, Urine Waived  Result Value Ref Range   Microalb, Ur Waived 30 (H) 0 - 19 mg/L   Creatinine, Urine Waived 200 10 - 300 mg/dL   Microalb/Creat Ratio <30 <30 mg/g  TSH  Result Value Ref Range   TSH 1.370 0.450 - 4.500 uIU/mL  UA/M w/rflx Culture, Routine   Specimen: Urine   URINE  Result Value Ref Range   Specific Gravity, UA 1.025 1.005 - 1.030   pH, UA 6.0 5.0 - 7.5   Color, UA Yellow Yellow   Appearance Ur Clear Clear   Leukocytes,UA 1+ (A) Negative   Protein,UA Negative Negative/Trace   Glucose, UA Negative Negative   Ketones, UA Negative Negative   RBC, UA Negative Negative   Bilirubin, UA Negative Negative   Urobilinogen, Ur 0.2 0.2 - 1.0 mg/dL   Nitrite, UA Negative Negative   Microscopic Examination See below:    Urinalysis Reflex Comment       Assessment & Plan:    Problem List Items Addressed This Visit      Other   Bipolar 2 disorder ([place])    Didn't notice a significant improvement on the 79m prozac. Will go back down to 276m Recheck 3 months. Call with any concerns.       Chronic pain    Would like to do PT. Referral generated today. Refills x 3 months given today. Follow up 3 months.       Relevant Medications   FLUoxetine (PROZAC) 20 MG capsule   HYDROcodone-acetaminophen (NORCO) 10-325 MG tablet   HYDROcodone-acetaminophen (NORCO) 10-325 MG tablet (Start on 11/29/2019)   HYDROcodone-acetaminophen (NORCO) 10-325 MG tablet (Start on 10/30/2019)   Other Relevant Orders   Ambulatory referral to Physical Therapy    Other Visit Diagnoses    Gastroesophageal reflux disease, unspecified whether esophagitis present    -  Primary   Will increase her omeprazole to [id number] 2 weeks and call if not better after that.        Follow up plan: Return in about 3 months (around 12/31/2019).     . This visit was completed via MyChart due to the restrictions of the COVID-19 pandemic. All issues as above were discussed and addressed. Physical exam was done as above through visual confirmation on MyChart. If it was felt that the patient should be  evaluated in the office, they were directed there. The patient verbally consented to this visit. . Location of the patient: home . Location of the provider: work . Those involved with this call:  . Provider: Park Liter, DO . CMA: Tiffany Reel, CMA . Front Desk/Registration: Don Perking  . Time spent on call: 25 minutes with patient face to face via video conference. More than 50% of this time was spent in counseling and coordination of care. 40 minutes total spent in review of patient's record and preparation of their chart.

## 2019-09-30 NOTE — Assessment & Plan Note (Signed)
Didn't notice a significant improvement on the 40mg  prozac. Will go back down to 20mg . Recheck 3 months. Call with any concerns.

## 2019-09-30 NOTE — Assessment & Plan Note (Signed)
Would like to do PT. Referral generated today. Refills x 3 months given today. Follow up 3 months.

## 2019-10-13 ENCOUNTER — Ambulatory Visit: Payer: Managed Care, Other (non HMO) | Admitting: Family Medicine

## 2019-12-02 ENCOUNTER — Other Ambulatory Visit: Payer: Self-pay | Admitting: Family Medicine

## 2019-12-09 ENCOUNTER — Other Ambulatory Visit: Payer: Self-pay

## 2019-12-09 ENCOUNTER — Ambulatory Visit (INDEPENDENT_AMBULATORY_CARE_PROVIDER_SITE_OTHER): Payer: Managed Care, Other (non HMO) | Admitting: Family Medicine

## 2019-12-09 ENCOUNTER — Encounter: Payer: Self-pay | Admitting: Family Medicine

## 2019-12-09 ENCOUNTER — Ambulatory Visit: Payer: Managed Care, Other (non HMO) | Admitting: Family Medicine

## 2019-12-09 VITALS — BP 134/77 | HR 69 | Temp 98.0°F | Ht 63.78 in | Wt 220.2 lb

## 2019-12-09 DIAGNOSIS — N644 Mastodynia: Secondary | ICD-10-CM

## 2019-12-09 DIAGNOSIS — R079 Chest pain, unspecified: Secondary | ICD-10-CM

## 2019-12-09 MED ORDER — CYCLOBENZAPRINE HCL 10 MG PO TABS
10.0000 mg | ORAL_TABLET | Freq: Every day | ORAL | 2 refills | Status: DC
Start: 2019-12-09 — End: 2020-05-14

## 2019-12-09 NOTE — Patient Instructions (Addendum)
Call to schedule your mammogram: [facility]  Address: [address], [place], [place] [address]  Phone: [phone]   Pectoralis Major Tear Rehab Ask your health care provider which exercises are safe for you. Do exercises exactly as told by your health care provider and adjust them as directed. It is normal to feel mild stretching, pulling, tightness, or discomfort as you do these exercises. Stop right away if you feel sudden pain or your pain gets worse. Do not begin these exercises until told by your health care provider. Stretching and range-of-motion exercises These exercises warm up your muscles and joints and improve the movement and flexibility of your shoulder. These exercises can also help to relieve pain, numbness, and tingling. Pendulum This is a shoulder exercise in which you let the injured arm dangle toward the floor and then swing it like a clock pendulum. 1. Stand near a wall or a surface that you can hold onto for balance. 2. Bend at the waist and let your left / right arm hang straight down. Use your other arm to keep your balance. 3. Relax your arm and shoulder muscles, and move your hips and your trunk so your left / right arm swings freely. Your arm should swing because of the motion of your body, not because you are using your arm or shoulder muscles. 4. Keep moving your hips and trunk so your arm swings in the following directions, as told by your health care provider: ? Side to side. ? Forward and backward. ? In clockwise and counterclockwise circles. 5. Slowly return to the starting position. Repeat __________ times. Complete this exercise __________ times a day. Standing shoulder abduction, passive In this exercise, the injured shoulder relaxes (passive) while you use the healthy arm to push it away from your body (abduction). 1. Stand and hold a broomstick, a cane, or a similar object. Place your hands a little more than  shoulder width apart on the object. Your left / right hand should be palm-up, and your other hand should be palm-down. 2. While keeping your elbow straight and your shoulder muscles relaxed, push the stick across your body toward your left / right side. Raise your left / right arm to the side of your body and then over your head until you feel a stretch in your shoulder. ? Stop when you reach the angle that is recommended by your health care provider. ? Avoid shrugging your shoulder while you raise your arm. Keep your shoulder blade tucked down toward the middle of your spine. 3. Hold for __________ seconds. 4. Slowly return to the starting position. Repeat __________ times. Complete this exercise __________ times a day. Supine wand shoulder flexion, passive In this exercise, the injured shoulder relaxes (passive) while you use the healthy arm to move it (flexion). 1. Lie on your back (supine position). You may bend your knees for comfort. 2. Hold a broomstick, a cane, or a similar object so that your hands are about shoulder width apart on the object. Your palms should face toward your feet. 3. Raise your left / right arm in front of your face, then behind your head (toward the floor). Use your other hand to help you do this. Stop when you feel a gentle stretch in your shoulder, or when you reach the angle that is recommended by your health care provider. 4. Hold for __________ seconds. 5. Use the stick and your other arm to help you return your left /  right arm to the starting position. Repeat __________ times. Complete this exercise __________ times a day. Wand shoulder external rotation, passive In this exercise, the injured shoulder relaxes (passive) while you use the healthy arm to push it away to your side (external rotation). 1. Stand and hold a broomstick, a cane, or a similar object so your hands are about shoulder width apart on the object. 2. Start with your arms hanging down, then bend  both elbows to a 90-degree angle (right angle). 3. Keep your left / right elbow at your side. Use your other hand to push the stick so your left / right forearm moves away from your body, out to your side. ? Keep your left / right elbow bent to 90 degrees and keep it against your side. ? Stop when you feel a gentle stretch in your shoulder, or when you reach the angle recommended by your health care provider. 4. Hold for __________ seconds. 5. Use the stick to help you return your left / right arm to the starting position. Repeat __________ times. Complete this exercise __________ times a day. Strengthening exercises These exercises build strength and endurance in your shoulder. Endurance is the ability to use your muscles for a long time, even after your muscles get tired. Scapular protraction, standing  1. Stand so you are facing a wall. Place your feet about one arm-length away from the wall. 2. Place your hands on the wall and straighten your elbows. 3. Keep your hands on the wall as you push your upper back away from the wall. You should feel your shoulder blades (scapulae) sliding forward (protraction) around your rib cage. Keep your elbows and your head still. ? If you are not sure that you are doing this exercise correctly, ask your health care provider for more instructions. 4. Hold for __________ seconds. 5. Slowly return to the starting position. Let your muscles relax completely before you repeat this exercise. Repeat __________ times. Complete this exercise __________ times a day. Scapular retraction, seated This exercise is also called shoulder blade squeezes. 1. Sit with good posture in a stable chair without armrests. Do not let your back touch the back of the chair. 2. Your arms should be at your sides with your elbows bent. You may rest your forearms on a pillow if that is more comfortable. 3. Squeeze your shoulder blades (scapulae) together. Bring them down and back  (retraction). ? Keep your shoulders level. ? Do not lift your shoulders up toward your ears. 4. Hold for __________ seconds. 5. Return to the starting position. Repeat __________ times. Complete this exercise __________ times a day. This information is not intended to replace advice given to you by your health care provider. Make sure you discuss any questions you have with your health care provider. Document Revised: 10/17/2018 Document Reviewed: 06/24/2018 Elsevier Patient Education  [place].

## 2019-12-09 NOTE — Progress Notes (Signed)
BP 134/77 (BP Location: Left Arm, Patient Position: Sitting, Cuff Size: Normal)   Pulse 69   Temp 98 F (36.7 C) (Oral)   Ht 5' 3.78" (1.62 m)   Wt 220 lb 3.2 oz (99.9 kg)   SpO2 97%   BMI 38.06 kg/m    Subjective:    Patient ID: Carol Becker, female    DOB: [birth year], 59 y.o.   MRN: [id number]  HPI: Carol Becker is a 59 y.o. female  Chief Complaint  Patient presents with  . Breast Pain    under left breast, tender to touch.    CHEST PAIN Time since onset:Thursday (5 days ago) Duration:5 days Onset: sudden Quality: sharp with touch, dull without Severity: 1/10 most of the time, 7/10 with touch Location: under L breast Radiation: none Episode duration:  Frequency: constant Related to exertion: no Activity when pain started:  Trauma: no Anxiety/recent stressors: no Status: stable Treatments attempted: nothing  Current pain status: chest wall tender Shortness of breath: no Cough: no Nausea: no Diaphoresis: no Heartburn: no Palpitations: no  Relevant past medical, surgical, family and social history reviewed and updated as indicated. Interim medical history since our last visit reviewed. Allergies and medications reviewed and updated.  Review of Systems  Per HPI unless specifically indicated above     Objective:    BP 134/77 (BP Location: Left Arm, Patient Position: Sitting, Cuff Size: Normal)   Pulse 69   Temp 98 F (36.7 C) (Oral)   Ht 5' 3.78" (1.62 m)   Wt 220 lb 3.2 oz (99.9 kg)   SpO2 97%   BMI 38.06 kg/m   Wt Readings from Last 3 Encounters:  12/09/19 220 lb 3.2 oz (99.9 kg)  11/25/18 219 lb (99.3 kg)  06/24/18 220 lb 9.6 oz (100.1 kg)    Physical Exam Vitals and nursing note reviewed.  Constitutional:      General: She is not in acute distress.    Appearance: Normal appearance. She is not ill-appearing, toxic-appearing or diaphoretic.  HENT:     Head: Normocephalic and atraumatic.     Right Ear: External ear normal.   Left Ear: External ear normal.     Nose: Nose normal.     Mouth/Throat:     Mouth: Mucous membranes are moist.     Pharynx: Oropharynx is clear.  Eyes:     General: No scleral icterus.       Right eye: No discharge.        Left eye: No discharge.     Extraocular Movements: Extraocular movements intact.     Conjunctiva/sclera: Conjunctivae normal.     Pupils: Pupils are equal, round, and reactive to light.  Cardiovascular:     Rate and Rhythm: Normal rate and regular rhythm.     Pulses: Normal pulses.     Heart sounds: Normal heart sounds. No murmur. No friction rub. No gallop.   Pulmonary:     Effort: Pulmonary effort is normal. No respiratory distress.     Breath sounds: Normal breath sounds. No stridor. No wheezing, rhonchi or rales.  Chest:     Chest wall: No tenderness.     Breasts:        Right: No swelling, bleeding, inverted nipple, mass, nipple discharge, skin change or tenderness.        Left: No swelling, bleeding, inverted nipple, mass, nipple discharge, skin change or tenderness.     Comments: Tenderness in deep tissue at 7:00 of L breast,  not tender to palpation of breast tissue Musculoskeletal:        General: Normal range of motion.     Cervical back: Normal range of motion and neck supple.  Lymphadenopathy:     Upper Body:     Right upper body: No supraclavicular, axillary or pectoral adenopathy.     Left upper body: No supraclavicular, axillary or pectoral adenopathy.  Skin:    General: Skin is warm and dry.     Capillary Refill: Capillary refill takes less than 2 seconds.     Coloration: Skin is not jaundiced or pale.     Findings: No bruising, erythema, lesion or rash.  Neurological:     General: No focal deficit present.     Mental Status: She is alert and oriented to person, place, and time. Mental status is at baseline.  Psychiatric:        Mood and Affect: Mood normal.        Behavior: Behavior normal.        Thought Content: Thought content normal.         Judgment: Judgment normal.     Results for orders placed or performed in visit on 06/24/19  Microscopic Examination   URINE  Result Value Ref Range   WBC, UA 11-30 (A) 0 - 5 /hpf   RBC None seen 0 - 2 /hpf   Epithelial Cells (non renal) 0-10 0 - 10 /hpf   Bacteria, UA Few (A) None seen/Few  Urine Culture, Reflex   URINE  Result Value Ref Range   Urine Culture, Routine Final report    Organism ID, Bacteria Comment   Bayer DCA Hb A1c Waived  Result Value Ref Range   HB A1C (BAYER DCA - WAIVED) 6.4 <7.0 %  CBC with Differential OUT  Result Value Ref Range   WBC 5.6 3.4 - 10.8 x10E3/uL   RBC 4.29 3.77 - 5.28 x10E6/uL   Hemoglobin 13.7 11.1 - 15.9 g/dL   Hematocrit 41.3 34.0 - 46.6 %   MCV 96 79 - 97 fL   MCH 31.9 26.6 - 33.0 pg   MCHC 33.2 31.5 - 35.7 g/dL   RDW 12.7 11.7 - 15.4 %   Platelets 222 150 - 450 x10E3/uL   Neutrophils 57 Not Estab. %   Lymphs 30 Not Estab. %   Monocytes 8 Not Estab. %   Eos 4 Not Estab. %   Basos 1 Not Estab. %   Neutrophils Absolute 3.2 1.4 - 7.0 x10E3/uL   Lymphocytes Absolute 1.7 0.7 - 3.1 x10E3/uL   Monocytes Absolute 0.4 0.1 - 0.9 x10E3/uL   EOS (ABSOLUTE) 0.2 0.0 - 0.4 x10E3/uL   Basophils Absolute 0.0 0.0 - 0.2 x10E3/uL   Immature Granulocytes 0 Not Estab. %   Immature Grans (Abs) 0.0 0.0 - 0.1 x10E3/uL  Comp Met (CMET)  Result Value Ref Range   Glucose 175 (H) 65 - 99 mg/dL   BUN 16 6 - 24 mg/dL   Creatinine, Ser 0.92 0.57 - 1.00 mg/dL   GFR calc non Af Amer 69 >59 mL/min/1.73   GFR calc Af Amer 80 >59 mL/min/1.73   BUN/Creatinine Ratio 17 9 - 23   Sodium 138 134 - 144 mmol/L   Potassium 4.5 3.5 - 5.2 mmol/L   Chloride 102 96 - 106 mmol/L   CO2 23 20 - 29 mmol/L   Calcium 9.9 8.7 - 10.2 mg/dL   Total Protein 6.9 6.0 - 8.5 g/dL   Albumin 4.3 3.8 -  4.9 g/dL   Globulin, Total 2.6 1.5 - 4.5 g/dL   Albumin/Globulin Ratio 1.7 1.2 - 2.2   Bilirubin Total 0.3 0.0 - 1.2 mg/dL   Alkaline Phosphatase 91 39 - 117 IU/L   AST 34 0  - 40 IU/L   ALT 54 (H) 0 - 32 IU/L  Lipid Panel w/o Chol/HDL Ratio OUT  Result Value Ref Range   Cholesterol, Total 166 100 - 199 mg/dL   Triglycerides 167 (H) 0 - 149 mg/dL   HDL 43 >39 mg/dL   VLDL Cholesterol Cal 29 5 - 40 mg/dL   LDL Chol Calc (NIH) 94 0 - 99 mg/dL  Microalbumin, Urine Waived  Result Value Ref Range   Microalb, Ur Waived 30 (H) 0 - 19 mg/L   Creatinine, Urine Waived 200 10 - 300 mg/dL   Microalb/Creat Ratio <30 <30 mg/g  TSH  Result Value Ref Range   TSH 1.370 0.450 - 4.500 uIU/mL  UA/M w/rflx Culture, Routine   Specimen: Urine   URINE  Result Value Ref Range   Specific Gravity, UA 1.025 1.005 - 1.030   pH, UA 6.0 5.0 - 7.5   Color, UA Yellow Yellow   Appearance Ur Clear Clear   Leukocytes,UA 1+ (A) Negative   Protein,UA Negative Negative/Trace   Glucose, UA Negative Negative   Ketones, UA Negative Negative   RBC, UA Negative Negative   Bilirubin, UA Negative Negative   Urobilinogen, Ur 0.2 0.2 - 1.0 mg/dL   Nitrite, UA Negative Negative   Microscopic Examination See below:    Urinalysis Reflex Comment       Assessment & Plan:   Problem List Items Addressed This Visit    None    Visit Diagnoses    Left-sided chest pain    -  Primary   Likely pec spasm. Will treat with flexeril and heat and stretches. EKG normal. Will get mammogram. Call with any concerns.    Relevant Orders   EKG 12-Lead (Completed)   Breast pain, left       Likely pec spasm. Will obtain mammogram. Await results.    Relevant Orders   MM 3D SCREEN BREAST BILATERAL   US BREAST LTD UNI LEFT INC AXILLA       Follow up plan: Return A scheduled.

## 2019-12-11 ENCOUNTER — Telehealth: Payer: Self-pay | Admitting: Family Medicine

## 2019-12-11 DIAGNOSIS — N644 Mastodynia: Secondary | ICD-10-CM

## 2019-12-11 NOTE — Telephone Encounter (Signed)
[facility] to Schedule US Breast. She states that the [place] is correct but because she has not had a diagnostic since 2019 they will also need an order for that.   [id number]  Thanks!

## 2019-12-18 ENCOUNTER — Other Ambulatory Visit: Payer: Managed Care, Other (non HMO)

## 2019-12-22 ENCOUNTER — Inpatient Hospital Stay: Admission: RE | Admit: 2019-12-22 | Payer: Managed Care, Other (non HMO) | Source: Ambulatory Visit

## 2019-12-23 ENCOUNTER — Ambulatory Visit: Payer: Managed Care, Other (non HMO) | Admitting: Family Medicine

## 2019-12-26 ENCOUNTER — Other Ambulatory Visit: Payer: Managed Care, Other (non HMO)

## 2020-01-01 ENCOUNTER — Ambulatory Visit: Payer: Managed Care, Other (non HMO) | Admitting: Family Medicine

## 2020-01-06 ENCOUNTER — Encounter: Payer: Self-pay | Admitting: Family Medicine

## 2020-01-06 ENCOUNTER — Other Ambulatory Visit: Payer: Self-pay

## 2020-01-06 ENCOUNTER — Ambulatory Visit (INDEPENDENT_AMBULATORY_CARE_PROVIDER_SITE_OTHER): Payer: Managed Care, Other (non HMO) | Admitting: Family Medicine

## 2020-01-06 VITALS — BP 135/79 | HR 89 | Temp 98.9°F | Wt 222.0 lb

## 2020-01-06 DIAGNOSIS — G894 Chronic pain syndrome: Secondary | ICD-10-CM | POA: Diagnosis not present

## 2020-01-06 DIAGNOSIS — F3181 Bipolar II disorder: Secondary | ICD-10-CM

## 2020-01-06 DIAGNOSIS — R1084 Generalized abdominal pain: Secondary | ICD-10-CM

## 2020-01-06 DIAGNOSIS — E119 Type 2 diabetes mellitus without complications: Secondary | ICD-10-CM | POA: Diagnosis not present

## 2020-01-06 DIAGNOSIS — E039 Hypothyroidism, unspecified: Secondary | ICD-10-CM

## 2020-01-06 DIAGNOSIS — E782 Mixed hyperlipidemia: Secondary | ICD-10-CM

## 2020-01-06 LAB — BAYER DCA HB A1C WAIVED: HB A1C (BAYER DCA - WAIVED): 6.8 % (ref <7.0)

## 2020-01-06 MED ORDER — PANTOPRAZOLE SODIUM 40 MG PO TBEC: 40.0000 mg | DELAYED_RELEASE_TABLET | Freq: Every day | ORAL | 1 refills | Status: DC

## 2020-01-06 NOTE — Progress Notes (Signed)
BP 135/79 (BP Location: Left Arm, Patient Position: Sitting, Cuff Size: Normal)   Pulse 89   Temp 98.9 F (37.2 C) (Oral)   Wt 222 lb (100.7 kg)   SpO2 99%   BMI 38.37 kg/m    Subjective:    Patient ID: Carol Becker, female    DOB: 1960/12/28, 59 y.o.   MRN: [id number]  HPI: Carol Becker is a 59 y.o. female  Chief Complaint  Patient presents with  . Manic Behavior  . Pain  . iritation    sounds  . Stress    causes stomach aches  . trembles    after extrenuous activity,    Chest pain is still there. Having her mammogram on Friday.   DIABETES Hypoglycemic episodes:no Polydipsia/polyuria: no Visual disturbance: no Chest pain: yes Paresthesias: no Glucose Monitoring: no  Accucheck frequency: Not Checking Taking Insulin?: no Blood Pressure Monitoring: not checking Retinal Examination: Not up to Date Foot Exam: Up to Date Diabetic Education: Completed Pneumovax: Up to Date Influenza: Up to Date Aspirin: no  ABDOMINAL PAIN  Duration:chronic- worse with stress Onset: sudden Severity: moderate Quality: cramping and discomfort Location:  generalized  Episode duration: hours Radiation: no Frequency: intermittent Aggravating factors: stress Status: worse Treatments attempted: antacids Fever: no Nausea: yes Vomiting: no Weight loss: no Decreased appetite: no Diarrhea: yes Constipation: yes Blood in stool: no Heartburn: no Jaundice: no Rash: no Dysuria/urinary frequency: no Hematuria: no History of sexually transmitted disease: no Recurrent NSAID use: no  BIPOLAR- still very bothered by noises. This seems to be getting worse.  Mood status: exacerbated Satisfied with current treatment?: no Symptom severity: moderate  Duration of current treatment : chronic Side effects: no Medication compliance: excellent compliance Psychotherapy/counseling: no  Previous psychiatric medications: prozac, seroquel Depressed mood: yes Anxious mood:  yes Anhedonia: no Significant weight loss or gain: no Insomnia: yes hard to fall asleep Fatigue: yes Feelings of worthlessness or guilt: yes Impaired concentration/indecisiveness: no Suicidal ideations: no Hopelessness: no Crying spells: yes Depression screen [facility] 2/9 01/08/2020 09/30/2019 06/29/2019 06/24/2018 03/21/2018  Decreased Interest 1 0 2 0 0  Down, Depressed, Hopeless 0 0 0 0 0  PHQ - 2 Score 1 0 2 0 0  Altered sleeping 0 0 2 0 0  Tired, decreased energy 1 2 2 1 3   Change in appetite 1 0 0 0 0  Feeling bad or failure about yourself  0 0 0 0 0  Trouble concentrating 0 0 1 0 0  Moving slowly or fidgety/restless 0 0 0 0 0  Suicidal thoughts 0 0 0 0 0  PHQ-9 Score 3 2 7 1 3   Difficult doing work/chores Somewhat difficult Not difficult at all Somewhat difficult Not difficult at all Not difficult at all   HYPERLIPIDEMIA Hyperlipidemia status: Stable Satisfied with current treatment?  yes Side effects:  no Medication compliance: excellent compliance Past cholesterol meds: simvastatin Supplements: none Aspirin:  yes The 10-year ASCVD risk score Mikey Bussing DC Jr., et al., 2013) is: 4.6%   Values used to calculate the score:     Age: 59 years     Sex: Female     Is Non-Hispanic African American: No     Diabetic: Yes     Tobacco smoker: No     Systolic Blood Pressure: 382 mmHg     Is BP treated: No     HDL Cholesterol: 51 mg/dL     Total Cholesterol: 165 mg/dL Chest pain:  yes Coronary artery disease:  no  CHRONIC PAIN- pain is improving. Feeling a bit better  Present dose: 30 Morphine equivalents Pain control status: better Duration: chronic Location: low back and neck Quality: aching and sore Current Pain Level: mild Previous Pain Level: moderate Breakthrough pain: no Benefit from narcotic medications: yes What Activities task can be accomplished with current medication? Interested in weaning off narcotics:yes   Stool softners/OTC fiber: yes  Previous pain specialty  evaluation: yes Non-narcotic analgesic meds: yes Narcotic contract: yes    Relevant past medical, surgical, family and social history reviewed and updated as indicated. Interim medical history since our last visit reviewed. Allergies and medications reviewed and updated.  Review of Systems  Constitutional: Negative.   HENT: Negative.   Respiratory: Negative.   Cardiovascular: Negative.   Gastrointestinal: Positive for abdominal pain, constipation and diarrhea. Negative for abdominal distention, anal bleeding, blood in stool, nausea, rectal pain and vomiting.  Genitourinary: Negative.   Musculoskeletal: Positive for arthralgias, back pain, myalgias, neck pain and neck stiffness. Negative for gait problem and joint swelling.  Skin: Negative.   Neurological: Negative.   Psychiatric/Behavioral: Positive for agitation and dysphoric mood. Negative for behavioral problems, confusion, decreased concentration, hallucinations, self-injury, sleep disturbance and suicidal ideas. The patient is nervous/anxious. The patient is not hyperactive.     Per HPI unless specifically indicated above     Objective:    BP 135/79 (BP Location: Left Arm, Patient Position: Sitting, Cuff Size: Normal)   Pulse 89   Temp 98.9 F (37.2 C) (Oral)   Wt 222 lb (100.7 kg)   SpO2 99%   BMI 38.37 kg/m   Wt Readings from Last 3 Encounters:  01/06/20 222 lb (100.7 kg)  12/09/19 220 lb 3.2 oz (99.9 kg)  11/25/18 219 lb (99.3 kg)    Physical Exam Vitals and nursing note reviewed.  Constitutional:      General: She is not in acute distress.    Appearance: Normal appearance. She is not ill-appearing, toxic-appearing or diaphoretic.  HENT:     Head: Normocephalic and atraumatic.     Right Ear: External ear normal.     Left Ear: External ear normal.     Nose: Nose normal.     Mouth/Throat:     Mouth: Mucous membranes are moist.     Pharynx: Oropharynx is clear.  Eyes:     General: No scleral icterus.        Right eye: No discharge.        Left eye: No discharge.     Extraocular Movements: Extraocular movements intact.     Conjunctiva/sclera: Conjunctivae normal.     Pupils: Pupils are equal, round, and reactive to light.  Cardiovascular:     Rate and Rhythm: Normal rate and regular rhythm.     Pulses: Normal pulses.     Heart sounds: Normal heart sounds. No murmur heard.  No friction rub. No gallop.   Pulmonary:     Effort: Pulmonary effort is normal. No respiratory distress.     Breath sounds: Normal breath sounds. No stridor. No wheezing, rhonchi or rales.  Chest:     Chest wall: No tenderness.  Abdominal:     General: Abdomen is flat. Bowel sounds are normal. There is no distension.     Palpations: Abdomen is soft. There is no mass.     Tenderness: There is no abdominal tenderness. There is no right CVA tenderness, left CVA tenderness, guarding or rebound.     Hernia: No hernia is present.  Musculoskeletal:        General: Normal range of motion.     Cervical back: Normal range of motion and neck supple.  Skin:    General: Skin is warm and dry.     Capillary Refill: Capillary refill takes less than 2 seconds.     Coloration: Skin is not jaundiced or pale.     Findings: No bruising, erythema, lesion or rash.  Neurological:     General: No focal deficit present.     Mental Status: She is alert and oriented to person, place, and time. Mental status is at baseline.  Psychiatric:        Mood and Affect: Mood normal.        Behavior: Behavior normal.        Thought Content: Thought content normal.        Judgment: Judgment normal.     Results for orders placed or performed in visit on 01/06/20  CBC with Differential/Platelet  Result Value Ref Range   WBC 11.2 (H) 3.4 - 10.8 x10E3/uL   RBC 4.45 3.77 - 5.28 x10E6/uL   Hemoglobin 13.7 11.1 - 15.9 g/dL   Hematocrit 42.0 34.0 - 46.6 %   MCV 94 79 - 97 fL   MCH 30.8 26.6 - 33.0 pg   MCHC 32.6 31 - 35 g/dL   RDW 12.5 11.7 - 15.4 %    Platelets 254 150 - 450 x10E3/uL   Neutrophils 77 Not Estab. %   Lymphs 15 Not Estab. %   Monocytes 7 Not Estab. %   Eos 1 Not Estab. %   Basos 0 Not Estab. %   Neutrophils Absolute 8.6 (H) 1 - 7 x10E3/uL   Lymphocytes Absolute 1.6 0 - 3 x10E3/uL   Monocytes Absolute 0.8 0 - 0 x10E3/uL   EOS (ABSOLUTE) 0.1 0.0 - 0.4 x10E3/uL   Basophils Absolute 0.0 0 - 0 x10E3/uL   Immature Granulocytes 0 Not Estab. %   Immature Grans (Abs) 0.0 0.0 - 0.1 x10E3/uL  Bayer DCA Hb A1c Waived  Result Value Ref Range   HB A1C (BAYER DCA - WAIVED) 6.8 <7.0 %  Comprehensive metabolic panel  Result Value Ref Range   Glucose 174 (H) 65 - 99 mg/dL   BUN 10 6 - 24 mg/dL   Creatinine, Ser 0.94 0.57 - 1.00 mg/dL   GFR calc non Af Amer 68 >59 mL/min/1.73   GFR calc Af Amer 78 >59 mL/min/1.73   BUN/Creatinine Ratio 11 9 - 23   Sodium 137 134 - 144 mmol/L   Potassium 3.9 3.5 - 5.2 mmol/L   Chloride 98 96 - 106 mmol/L   CO2 23 20 - 29 mmol/L   Calcium 9.2 8.7 - 10.2 mg/dL   Total Protein 7.4 6.0 - 8.5 g/dL   Albumin 4.4 3.8 - 4.9 g/dL   Globulin, Total 3.0 1.5 - 4.5 g/dL   Albumin/Globulin Ratio 1.5 1.2 - 2.2   Bilirubin Total 0.5 0.0 - 1.2 mg/dL   Alkaline Phosphatase 88 48 - 121 IU/L   AST 22 0 - 40 IU/L   ALT 31 0 - 32 IU/L  Lipid Panel w/o Chol/HDL Ratio  Result Value Ref Range   Cholesterol, Total 165 100 - 199 mg/dL   Triglycerides 126 0 - 149 mg/dL   HDL 51 >39 mg/dL   VLDL Cholesterol Cal 22 5 - 40 mg/dL   LDL Chol Calc (NIH) 92 0 - 99 mg/dL  TSH  Result Value Ref Range  TSH 0.435 (L) 0.450 - 4.500 uIU/mL      Assessment & Plan:   Problem List Items Addressed This Visit      Endocrine   Thyroid activity decreased    Rechecking labs today. Await results and treat as needed.       Relevant Orders   CBC with Differential/Platelet (Completed)   TSH (Completed)   Diabetes mellitus without complication ([place]) - Primary    Under good control on current regimen with A1c of 6.8.  Continue current regimen. Continue to monitor. Call with any concerns. Refills given.        Relevant Medications   simvastatin (ZOCOR) 20 MG tablet   Other Relevant Orders   CBC with Differential/Platelet (Completed)   Bayer DCA Hb A1c Waived (Completed)   Comprehensive metabolic panel (Completed)     Other   Hyperlipidemia    Under good control on current regimen. Continue current regimen. Continue to monitor. Call with any concerns. Refills given. Labs drawn today.       Relevant Medications   simvastatin (ZOCOR) 20 MG tablet   Other Relevant Orders   CBC with Differential/Platelet (Completed)   Comprehensive metabolic panel (Completed)   Lipid Panel w/o Chol/HDL Ratio (Completed)   Bipolar 2 disorder (HCC)    Seems to be harder to control. Would like to see psychiatry to see if there is anything they recommend. Referral generated today. Call with any concerns. Refills given today.      Relevant Orders   CBC with Differential/Platelet (Completed)   Ambulatory referral to Psychiatry   Chronic pain    Pain is doing a lot better. Feeling more like herself. Will continue medicine for now. Refills x 3 months given today. Call with any concerns. Follow up in 3 months.       Relevant Medications   FLUoxetine (PROZAC) 20 MG capsule   gabapentin (NEURONTIN) 600 MG tablet   HYDROcodone-acetaminophen (NORCO) 10-325 MG tablet   HYDROcodone-acetaminophen (NORCO) 10-325 MG tablet (Start on 02/07/2020)   HYDROcodone-acetaminophen (NORCO) 10-325 MG tablet (Start on 03/08/2020)   Other Relevant Orders   CBC with Differential/Platelet (Completed)    Other Visit Diagnoses    Generalized abdominal pain       Concerned for IBS. Will check labs and start probiotic. Call if not getting better or getting worse.        Follow up plan: Return in about 3 months (around 04/07/2020).

## 2020-01-06 NOTE — Patient Instructions (Signed)
Lactobacilis, Acidophilus

## 2020-01-07 LAB — COMPREHENSIVE METABOLIC PANEL
ALT: 31 IU/L (ref 0–32)
AST: 22 IU/L (ref 0–40)
Albumin/Globulin Ratio: 1.5 (ref 1.2–2.2)
Albumin: 4.4 g/dL (ref 3.8–4.9)
Alkaline Phosphatase: 88 IU/L (ref 48–121)
BUN/Creatinine Ratio: 11 (ref 9–23)
BUN: 10 mg/dL (ref 6–24)
Bilirubin Total: 0.5 mg/dL (ref 0.0–1.2)
CO2: 23 mmol/L (ref 20–29)
Calcium: 9.2 mg/dL (ref 8.7–10.2)
Chloride: 98 mmol/L (ref 96–106)
Creatinine, Ser: 0.94 mg/dL (ref 0.57–1.00)
GFR calc Af Amer: 78 mL/min/{1.73_m2} (ref >59)
GFR calc non Af Amer: 68 mL/min/{1.73_m2} (ref >59)
Globulin, Total: 3 g/dL (ref 1.5–4.5)
Glucose: 174 mg/dL — ABNORMAL HIGH (ref 65–99)
Potassium: 3.9 mmol/L (ref 3.5–5.2)
Sodium: 137 mmol/L (ref 134–144)
Total Protein: 7.4 g/dL (ref 6.0–8.5)

## 2020-01-07 LAB — LIPID PANEL W/O CHOL/HDL RATIO
Cholesterol, Total: 165 mg/dL (ref 100–199)
HDL: 51 mg/dL (ref >39)
LDL Chol Calc (NIH): 92 mg/dL (ref 0–99)
Triglycerides: 126 mg/dL (ref 0–149)
VLDL Cholesterol Cal: 22 mg/dL (ref 5–40)

## 2020-01-07 LAB — CBC WITH DIFFERENTIAL/PLATELET
Basophils Absolute: 0 10*3/uL (ref 0.0–0.2)
Basos: 0 %
EOS (ABSOLUTE): 0.1 10*3/uL (ref 0.0–0.4)
Eos: 1 %
Hematocrit: 42 % (ref 34.0–46.6)
Hemoglobin: 13.7 g/dL (ref 11.1–15.9)
Immature Grans (Abs): 0 10*3/uL (ref 0.0–0.1)
Immature Granulocytes: 0 %
Lymphocytes Absolute: 1.6 10*3/uL (ref 0.7–3.1)
Lymphs: 15 %
MCH: 30.8 pg (ref 26.6–33.0)
MCHC: 32.6 g/dL (ref 31.5–35.7)
MCV: 94 fL (ref 79–97)
Monocytes Absolute: 0.8 10*3/uL (ref 0.1–0.9)
Monocytes: 7 %
Neutrophils Absolute: 8.6 10*3/uL — ABNORMAL HIGH (ref 1.4–7.0)
Neutrophils: 77 %
Platelets: 254 10*3/uL (ref 150–450)
RBC: 4.45 x10E6/uL (ref 3.77–5.28)
RDW: 12.5 % (ref 11.7–15.4)
WBC: 11.2 10*3/uL — ABNORMAL HIGH (ref 3.4–10.8)

## 2020-01-07 LAB — TSH: TSH: 0.435 u[IU]/mL — ABNORMAL LOW (ref 0.450–4.500)

## 2020-01-08 ENCOUNTER — Ambulatory Visit
Admission: RE | Admit: 2020-01-08 | Discharge: 2020-01-08 | Disposition: A | Discharge status: Home / Self Care | Payer: Managed Care, Other (non HMO) | Source: Ambulatory Visit | Attending: Family Medicine | Admitting: Family Medicine

## 2020-01-08 ENCOUNTER — Encounter: Payer: Self-pay | Admitting: Family Medicine

## 2020-01-08 DIAGNOSIS — N644 Mastodynia: Secondary | ICD-10-CM

## 2020-01-08 MED ORDER — GABAPENTIN 600 MG PO TABS: 600.0000 mg | ORAL_TABLET | Freq: Two times a day (BID) | ORAL | 1 refills | Status: DC

## 2020-01-08 MED ORDER — QUETIAPINE FUMARATE 100 MG PO TABS: ORAL_TABLET | ORAL | 1 refills | Status: DC

## 2020-01-08 MED ORDER — HYDROCODONE-ACETAMINOPHEN 10-325 MG PO TABS: 1.0000 | ORAL_TABLET | Freq: Two times a day (BID) | ORAL | 0 refills | Status: DC | PRN

## 2020-01-08 MED ORDER — PANTOPRAZOLE SODIUM 40 MG PO TBEC: 40.0000 mg | DELAYED_RELEASE_TABLET | Freq: Every day | ORAL | 1 refills | Status: DC

## 2020-01-08 MED ORDER — SIMVASTATIN 20 MG PO TABS: ORAL_TABLET | ORAL | 1 refills | Status: DC

## 2020-01-08 MED ORDER — HYDROCODONE-ACETAMINOPHEN 10-325 MG PO TABS: 1.0000 | ORAL_TABLET | Freq: Three times a day (TID) | ORAL | 0 refills | Status: DC | PRN

## 2020-01-08 MED ORDER — FLUOXETINE HCL 20 MG PO CAPS: 40.0000 mg | ORAL_CAPSULE | Freq: Every day | ORAL | 1 refills | Status: DC

## 2020-01-08 NOTE — Telephone Encounter (Signed)
According to medication list needs refill on Gabapentin, Simvastatin and Seroquel.  Messaging patient back to see if she's trying to fill the pantoprazole for 90 days instead of 30 and offer goodRx coupon.

## 2020-01-09 ENCOUNTER — Encounter: Payer: Self-pay | Admitting: Family Medicine

## 2020-01-09 NOTE — Assessment & Plan Note (Signed)
Seems to be harder to control. Would like to see psychiatry to see if there is anything they recommend. Referral generated today. Call with any concerns. Refills given today.

## 2020-01-09 NOTE — Assessment & Plan Note (Signed)
Under good control on current regimen. Continue current regimen. Continue to monitor. Call with any concerns. Refills given. Labs drawn today.   

## 2020-01-09 NOTE — Assessment & Plan Note (Signed)
Under good control on current regimen with A1c of 6.8. Continue current regimen. Continue to monitor. Call with any concerns. Refills given.

## 2020-01-09 NOTE — Assessment & Plan Note (Signed)
Pain is doing a lot better. Feeling more like herself. Will continue medicine for now. Refills x 3 months given today. Call with any concerns. Follow up in 3 months.

## 2020-01-09 NOTE — Assessment & Plan Note (Signed)
Rechecking labs today. Await results and treat as needed.

## 2020-03-01 ENCOUNTER — Encounter: Payer: Self-pay | Admitting: Family Medicine

## 2020-03-05 ENCOUNTER — Encounter: Payer: Self-pay | Admitting: Family Medicine

## 2020-03-05 ENCOUNTER — Telehealth (INDEPENDENT_AMBULATORY_CARE_PROVIDER_SITE_OTHER): Payer: Managed Care, Other (non HMO) | Admitting: Family Medicine

## 2020-03-05 DIAGNOSIS — R251 Tremor, unspecified: Secondary | ICD-10-CM | POA: Diagnosis not present

## 2020-03-05 DIAGNOSIS — R197 Diarrhea, unspecified: Secondary | ICD-10-CM

## 2020-03-05 DIAGNOSIS — Z7189 Other specified counseling: Secondary | ICD-10-CM | POA: Diagnosis not present

## 2020-03-05 DIAGNOSIS — Z7185 Encounter for immunization safety counseling: Secondary | ICD-10-CM

## 2020-03-05 MED ORDER — SUCRALFATE 1 G PO TABS: 1.0000 g | ORAL_TABLET | Freq: Three times a day (TID) | ORAL | 3 refills | Status: DC

## 2020-03-05 NOTE — Progress Notes (Signed)
There were no vitals taken for this visit.   Subjective:    Patient ID: Carol Becker, female    DOB: [birth year], 59 y.o.   MRN: [id number]  HPI: Carol Becker is a 59 y.o. female  Chief Complaint  Patient presents with  . Abdominal Pain    pt states she has had stomach pain and bloating since Thursday. She states that it is not constant but does happen everyday. States she had diarrhea one day but no more since. Also states that everytime she goes to the restroom to urinate, she has a BM as well   ABDOMINAL ISSUES- every time she went to urinate, she would have a BM as well, past 2 days, she had diarrhea past couple of days, then she has been normal for the past day or so Duration: 8 days Nature: knot in the upper stomach Location: epigastric  Severity: moderate  Radiation: no Episode duration:oonstant Frequency: constant Alleviating factors: nothing Aggravating factors: nothing Treatments attempted: antacids Constipation: no Diarrhea: yes Mucous in the stool: no Heartburn: yes 2x during that Bloating:yes Flatulence: no Nausea: no Vomiting: no  Melena or hematochezia: no Rash: no Jaundice: no Fever: no Weight loss: no   Relevant past medical, surgical, family and social history reviewed and updated as indicated. Interim medical history since our last visit reviewed. Allergies and medications reviewed and updated.  Review of Systems  Constitutional: Negative.   HENT: Negative.   Respiratory: Negative.   Cardiovascular: Negative.   Gastrointestinal: Positive for abdominal pain and diarrhea. Negative for abdominal distention, anal bleeding, blood in stool, constipation, nausea, rectal pain and vomiting.  Musculoskeletal: Negative.   Skin: Negative.   Neurological: Positive for tremors. Negative for dizziness, seizures, syncope, facial asymmetry, speech difficulty, weakness, light-headedness, numbness and headaches.  Psychiatric/Behavioral: Positive for  dysphoric mood. Negative for agitation, behavioral problems, confusion, decreased concentration, hallucinations, self-injury, sleep disturbance and suicidal ideas. The patient is nervous/anxious. The patient is not hyperactive.     Per HPI unless specifically indicated above     Objective:    There were no vitals taken for this visit.  Wt Readings from Last 3 Encounters:  01/06/20 222 lb (100.7 kg)  12/09/19 220 lb 3.2 oz (99.9 kg)  11/25/18 219 lb (99.3 kg)    Physical Exam Vitals and nursing note reviewed.  Constitutional:      General: She is not in acute distress.    Appearance: Normal appearance. She is not ill-appearing, toxic-appearing or diaphoretic.  HENT:     Head: Normocephalic and atraumatic.     Right Ear: External ear normal.     Left Ear: External ear normal.     Nose: Nose normal.     Mouth/Throat:     Mouth: Mucous membranes are moist.     Pharynx: Oropharynx is clear.  Eyes:     General: No scleral icterus.       Right eye: No discharge.        Left eye: No discharge.     Conjunctiva/sclera: Conjunctivae normal.     Pupils: Pupils are equal, round, and reactive to light.  Pulmonary:     Effort: Pulmonary effort is normal. No respiratory distress.     Comments: Speaking in full sentences Musculoskeletal:        General: Normal range of motion.     Cervical back: Normal range of motion.  Skin:    Coloration: Skin is not jaundiced or pale.     Findings: No  bruising, erythema, lesion or rash.  Neurological:     Mental Status: She is alert and oriented to person, place, and time. Mental status is at baseline.  Psychiatric:        Mood and Affect: Mood normal.        Behavior: Behavior normal.        Thought Content: Thought content normal.        Judgment: Judgment normal.     Results for orders placed or performed in visit on 01/06/20  CBC with Differential/Platelet  Result Value Ref Range   WBC 11.2 (H) 3.4 - 10.8 x10E3/uL   RBC 4.45 3.77 - 5.28  x10E6/uL   Hemoglobin 13.7 11.1 - 15.9 g/dL   Hematocrit 42.0 34.0 - 46.6 %   MCV 94 79 - 97 fL   MCH 30.8 26.6 - 33.0 pg   MCHC 32.6 31 - 35 g/dL   RDW 12.5 11.7 - 15.4 %   Platelets 254 150 - 450 x10E3/uL   Neutrophils 77 Not Estab. %   Lymphs 15 Not Estab. %   Monocytes 7 Not Estab. %   Eos 1 Not Estab. %   Basos 0 Not Estab. %   Neutrophils Absolute 8.6 (H) 1 - 7 x10E3/uL   Lymphocytes Absolute 1.6 0 - 3 x10E3/uL   Monocytes Absolute 0.8 0 - 0 x10E3/uL   EOS (ABSOLUTE) 0.1 0.0 - 0.4 x10E3/uL   Basophils Absolute 0.0 0 - 0 x10E3/uL   Immature Granulocytes 0 Not Estab. %   Immature Grans (Abs) 0.0 0.0 - 0.1 x10E3/uL  Bayer DCA Hb A1c Waived  Result Value Ref Range   HB A1C (BAYER DCA - WAIVED) 6.8 <7.0 %  Comprehensive metabolic panel  Result Value Ref Range   Glucose 174 (H) 65 - 99 mg/dL   BUN 10 6 - 24 mg/dL   Creatinine, Ser 0.94 0.57 - 1.00 mg/dL   GFR calc non Af Amer 68 >59 mL/min/1.73   GFR calc Af Amer 78 >59 mL/min/1.73   BUN/Creatinine Ratio 11 9 - 23   Sodium 137 134 - 144 mmol/L   Potassium 3.9 3.5 - 5.2 mmol/L   Chloride 98 96 - 106 mmol/L   CO2 23 20 - 29 mmol/L   Calcium 9.2 8.7 - 10.2 mg/dL   Total Protein 7.4 6.0 - 8.5 g/dL   Albumin 4.4 3.8 - 4.9 g/dL   Globulin, Total 3.0 1.5 - 4.5 g/dL   Albumin/Globulin Ratio 1.5 1.2 - 2.2   Bilirubin Total 0.5 0.0 - 1.2 mg/dL   Alkaline Phosphatase 88 48 - 121 IU/L   AST 22 0 - 40 IU/L   ALT 31 0 - 32 IU/L  Lipid Panel w/o Chol/HDL Ratio  Result Value Ref Range   Cholesterol, Total 165 100 - 199 mg/dL   Triglycerides 126 0 - 149 mg/dL   HDL 51 >39 mg/dL   VLDL Cholesterol Cal 22 5 - 40 mg/dL   LDL Chol Calc (NIH) 92 0 - 99 mg/dL  TSH  Result Value Ref Range   TSH 0.435 (L) 0.450 - 4.500 uIU/mL      Assessment & Plan:   Problem List Items Addressed This Visit    None    Visit Diagnoses    Diarrhea, unspecified type    -  Primary   Possibly gastritis. Will treat with carafate and check labs. Will  check FOBT and stool studies if diarrhea returns. Call with any concerns.    Relevant Orders  Comprehensive metabolic panel   CBC with Differential/Platelet   Novel Coronavirus, NAA ([facility])   Fecal occult blood, imunochemical([facility])   Stool C-Diff Toxin Assay   Fecal leukocytes   Ova and parasite examination   Stool Culture   Tremor       Getting worse. Would like to see neurology. Referral generated today.   Relevant Orders   Ambulatory referral to Neurology   Vaccine counseling       Discussed options for COVID vaccines. Continue to monitor.        Follow up plan: Return if symptoms worsen or fail to improve.   . This visit was completed via MyChart due to the restrictions of the COVID-19 pandemic. All issues as above were discussed and addressed. Physical exam was done as above through visual confirmation on MyChart. If it was felt that the patient should be evaluated in the office, they were directed there. The patient verbally consented to this visit. . Location of the patient: home . Location of the provider: work . Those involved with this call:  . Provider: Park Liter, DO . CMA: Lauretta Grill, RMA . Front Desk/Registration: Don Perking  . Time spent on call: 25 minutes with patient face to face via video conference. More than 50% of this time was spent in counseling and coordination of care. 40 minutes total spent in review of patient's record and preparation of their chart.

## 2020-03-05 NOTE — Patient Instructions (Signed)
To schedule a COVID test, please  text "COVID" to [phone], OR you can log on to [email or web address] to easily make an on-line appointment. If you do not have access to a smart phone or PC, you can call [phone] to get assistance.  

## 2020-03-06 LAB — CBC WITH DIFFERENTIAL/PLATELET
Basophils Absolute: 0 10*3/uL (ref 0.0–0.2)
Basos: 1 %
EOS (ABSOLUTE): 0.1 10*3/uL (ref 0.0–0.4)
Eos: 2 %
Hematocrit: 43.1 % (ref 34.0–46.6)
Hemoglobin: 14.5 g/dL (ref 11.1–15.9)
Immature Grans (Abs): 0 10*3/uL (ref 0.0–0.1)
Immature Granulocytes: 0 %
Lymphocytes Absolute: 2.1 10*3/uL (ref 0.7–3.1)
Lymphs: 31 %
MCH: 31 pg (ref 26.6–33.0)
MCHC: 33.6 g/dL (ref 31.5–35.7)
MCV: 92 fL (ref 79–97)
Monocytes Absolute: 0.6 10*3/uL (ref 0.1–0.9)
Monocytes: 8 %
Neutrophils Absolute: 4.1 10*3/uL (ref 1.4–7.0)
Neutrophils: 58 %
Platelets: 277 10*3/uL (ref 150–450)
RBC: 4.67 x10E6/uL (ref 3.77–5.28)
RDW: 12.6 % (ref 11.7–15.4)
WBC: 7 10*3/uL (ref 3.4–10.8)

## 2020-03-06 LAB — COMPREHENSIVE METABOLIC PANEL
ALT: 45 IU/L — ABNORMAL HIGH (ref 0–32)
AST: 35 IU/L (ref 0–40)
Albumin/Globulin Ratio: 1.5 (ref 1.2–2.2)
Albumin: 4.4 g/dL (ref 3.8–4.9)
Alkaline Phosphatase: 87 IU/L (ref 48–121)
BUN/Creatinine Ratio: 15 (ref 9–23)
BUN: 13 mg/dL (ref 6–24)
Bilirubin Total: 0.4 mg/dL (ref 0.0–1.2)
CO2: 23 mmol/L (ref 20–29)
Calcium: 9.7 mg/dL (ref 8.7–10.2)
Chloride: 101 mmol/L (ref 96–106)
Creatinine, Ser: 0.87 mg/dL (ref 0.57–1.00)
GFR calc Af Amer: 85 mL/min/{1.73_m2} (ref >59)
GFR calc non Af Amer: 74 mL/min/{1.73_m2} (ref >59)
Globulin, Total: 2.9 g/dL (ref 1.5–4.5)
Glucose: 143 mg/dL — ABNORMAL HIGH (ref 65–99)
Potassium: 4.6 mmol/L (ref 3.5–5.2)
Sodium: 136 mmol/L (ref 134–144)
Total Protein: 7.3 g/dL (ref 6.0–8.5)

## 2020-03-08 ENCOUNTER — Other Ambulatory Visit: Payer: Self-pay

## 2020-03-08 ENCOUNTER — Other Ambulatory Visit: Payer: Managed Care, Other (non HMO)

## 2020-03-08 DIAGNOSIS — R197 Diarrhea, unspecified: Secondary | ICD-10-CM

## 2020-03-10 ENCOUNTER — Encounter: Payer: Self-pay | Admitting: Family Medicine

## 2020-03-12 LAB — FECAL OCCULT BLOOD, IMMUNOCHEMICAL: Fecal Occult Bld: NEGATIVE

## 2020-03-23 ENCOUNTER — Encounter: Payer: Self-pay | Admitting: Psychiatry

## 2020-03-23 ENCOUNTER — Telehealth (INDEPENDENT_AMBULATORY_CARE_PROVIDER_SITE_OTHER): Payer: 59 | Admitting: Psychiatry

## 2020-03-23 ENCOUNTER — Other Ambulatory Visit: Payer: Self-pay

## 2020-03-23 DIAGNOSIS — F411 Generalized anxiety disorder: Secondary | ICD-10-CM | POA: Insufficient documentation

## 2020-03-23 DIAGNOSIS — G2119 Other drug induced secondary parkinsonism: Secondary | ICD-10-CM | POA: Diagnosis not present

## 2020-03-23 DIAGNOSIS — F3181 Bipolar II disorder: Secondary | ICD-10-CM

## 2020-03-23 MED ORDER — LAMOTRIGINE 25 MG PO TABS: 25.0000 mg | ORAL_TABLET | Freq: Every day | ORAL | 1 refills | Status: DC

## 2020-03-23 MED ORDER — FLUOXETINE HCL 20 MG PO CAPS: 20.0000 mg | ORAL_CAPSULE | Freq: Every day | ORAL | 1 refills | Status: DC

## 2020-03-23 MED ORDER — BENZTROPINE MESYLATE 0.5 MG PO TABS: 0.5000 mg | ORAL_TABLET | Freq: Every day | ORAL | 1 refills | Status: DC | PRN

## 2020-03-23 NOTE — Progress Notes (Signed)
Provider Location : [facility] Patient Location : Home  Participants: Patient , Provider  Virtual Visit via Video Note  I connected with Carol Becker on 03/23/20 at  1:00 PM EDT by a video enabled telemedicine application and verified that I am speaking with the correct person using two identifiers.   I discussed the limitations of evaluation and management by telemedicine and the availability of in person appointments. The patient expressed understanding and agreed to proceed.     I discussed the assessment and treatment plan with the patient. The patient was provided an opportunity to ask questions and all were answered. The patient agreed with the plan and demonstrated an understanding of the instructions.   The patient was advised to call back or seek an in-person evaluation if the symptoms worsen or if the condition fails to improve as anticipated.   Psychiatric Initial Adult Assessment   Patient Identification: Carol Becker MRN:  [id number] Date of Evaluation:  03/23/2020 Referral Source: Dr.Megan Wynetta Emery Chief Complaint:   Chief Complaint    Establish Care     Visit Diagnosis:    ICD-10-CM   1. Bipolar 2 disorder (HCC)  F31.81 lamoTRIgine (LAMICTAL) 25 MG tablet    FLUoxetine (PROZAC) 20 MG capsule   hypomanic , mixed features  2. 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  Patient reports she was diagnosed with bipolar disorder in her early 23s.  She reports at that time she was struggling with severe job related stressors.  She reports she had racing thoughts, inability to sleep, anxiety symptoms as well as some depressive symptoms.  Patient reports she was hence started on Seroquel  which she has been taking for years.  She reports mood wise she is currently not struggling with significant bipolar symptoms.  She does report having a wave of depression that comes over her on and off which can last for a few minutes.  She denies any significant manic or hypomanic symptoms.  She does have racing thoughts at night and Seroquel does help with that.  She however reports she does not want to stay on Seroquel for too long and would like to get off of it if possible.  She has tried to come off of it in the past however she has severe withdrawal symptoms even when she misses just 1 dose of Seroquel.  She also reports possible side effect of Seroquel like having extreme shakiness especially when she is in stressful situations, also she reports muscular jerks when she is falling asleep which happens on a regular basis.  She has had a sleep study done in the past this may have been 4 years ago however she is noncompliant with her CPAP.  She reports she does not like to use the mask which is very uncomfortable for her.  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  She is often nervous, fidgety and restless.  She reports even when she is not anxious she is moving around a lot.  Unknown if this is a side effect of her Seroquel or due to her anxiety.  She does report a history of panic attacks.  She reports her anxiety symptoms is getting worse however she  has not had any significant panic attacks recently.  The last panic attack may have been several years ago.  She described her panic symptoms as passing out episodes when she is in stressful situation.  She usually copes with it by trying to relax and taking deep breaths.  She is currently on Prozac 40 mg which has helped to some extent.  Patient does report that she had a very difficult childhood.  She reports her parents separated and her father got custody.  She reports her father just left her to do what she  wanted and she did not have any parental guidance.  She reports she started abusing cannabis and alcohol at the age of 21 to 59 years old.  She reports she smoked cannabis until her early 1s.  She also reports she used to abuse alcohol heavily and started at the age of 39 or 42.  She reports she drank alcohol every single day for a very long time.  She quit using alcohol heavily maybe a year ago or so.  She currently uses it only socially.  She denies any DUIs, DWIs or other legal issues.  She denies any withdrawal symptoms.  She denies any treatment for her substance abuse problems.  Patient denies any suicidality, homicidality or perceptual disturbances.  Patient does report excessive sensitivity to sound.  However she does have upcoming neurology evaluation coming up.  Unknown what could be causing this.  Patient does report that she had recent thyroid labs done, per review of thyroid labs dated June 2021-her TSH was low.  Patient advised to follow-up with her primary care provider for further management.  Associated Signs/Symptoms: Depression Symptoms:  anxiety, Mood swings (Hypo) Manic Symptoms:  mood swings Anxiety Symptoms:  Excessive Worry, Panic Symptoms, Psychotic Symptoms:  Denies PTSD Symptoms: Negative  Past Psychiatric History: Patient denies inpatient mental health admissions.  Patient denies suicide attempts.  Patient reports she was diagnosed with bipolar disorder by her primary care provider in her early 46s.  Previous Psychotropic Medications: Yes Lithium, Seroquel, Prozac  Substance Abuse History in the last 12 months:  No.  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  Patient also reports heavy alcohol abuse on her life-started as a teenager and stopped using a year ago.  She currently uses it only socially.  She denies being in any kind of treatment for substance abuse in the past.  Consequences of  Substance Abuse: Negative  Past Medical History:  Past Medical History:  Diagnosis Date  . Bipolar 1 disorder ([facility])   . Cervical spinal stenosis   . GERD (gastroesophageal reflux disease)   . Hyperlipidemia   . Osteopenia   . Sleep apnea   . Thyroid disease     Past Surgical History:  Procedure Laterality Date  . ANTERIOR / POSTERIOR COMBINED FUSION CERVICAL SPINE    . KNEE ARTHROSCOPY WITH EXCISION BAKER'S CYST    . TUBAL LIGATION      Family Psychiatric History: Daughter-bipolar disorder, mother-dementia  Family History:  Family History  Problem Relation Age of Onset  . Heart disease Father        CAD  . Heart disease Sister 35       CAD  . Cancer Maternal Grandmother        breast  . Breast cancer Maternal Grandmother 29  . Cancer Paternal Grandmother        breast  .  Breast cancer Paternal Grandmother 76  . Bipolar disorder Daughter   . Alzheimer's disease Maternal Grandfather   . Heart disease Cousin   . Parkinson's disease Sister     Social History:   Social History   Socioeconomic History  . Marital status: Married    Spouse name: Not on file  . Number of children: Not on file  . Years of education: Not on file  . Highest education level: Not on file  Occupational History  . Not on file  Tobacco Use  . Smoking status: Former Smoker    Quit date: 04/06/2011    Years since quitting: 8.9  . Smokeless tobacco: Never Used  Vaping Use  . Vaping Use: Every day  Substance and Sexual Activity  . Alcohol use: Yes    Comment: On Occasion  . Drug use: No  . Sexual activity: Yes    Birth control/protection: Post-menopausal  Other Topics Concern  . Not on file  Social History Narrative  . Not on file   Social Determinants of Health   Financial Resource Strain:   . Difficulty of Paying Living Expenses: Not on file  Food Insecurity:   . Worried About Charity fundraiser in the Last Year: Not on file  . Ran Out of Food in the Last Year: Not on file   Transportation Needs:   . Lack of Transportation (Medical): Not on file  . Lack of Transportation (Non-Medical): Not on file  Physical Activity:   . Days of Exercise per Week: Not on file  . Minutes of Exercise per Session: Not on file  Stress:   . Feeling of Stress : Not on file  Social Connections:   . Frequency of Communication with Friends and Family: Not on file  . Frequency of Social Gatherings with Friends and Family: Not on file  . Attends Religious Services: Not on file  . Active Member of Clubs or Organizations: Not on file  . Attends Archivist Meetings: Not on file  . Marital Status: Not on file    Additional Social History: Patient was raised by her father.  She however reports he was never around and she had to take care of herself.  Her parents divorced when she was very young.  Patient is currently married, has been living with her husband since the past 42 years.  She reports a good relationship with him.  She has 2 daughters.  Patient reports she currently works at [employer] and reports work is going well.  She denies any history of legal problems.  Allergies:   Allergies  Allergen Reactions  . Crestor [Rosuvastatin Calcium] Other (See Comments)    Indigestion  . Rosuvastatin Calcium Other (See Comments)    Indigestion  . Sulfa Antibiotics Nausea Only  . Zithromax [Azithromycin] Other (See Comments)    Not effective    Metabolic Disorder Labs: Lab Results  Component Value Date   HGBA1C 6.8 01/06/2020   No results found for: PROLACTIN Lab Results  Component Value Date   CHOL 165 01/06/2020   TRIG 126 01/06/2020   HDL 51 01/06/2020   VLDL 31 (H) 06/13/2016   LDLCALC 92 01/06/2020   LDLCALC 94 06/24/2019   Lab Results  Component Value Date   TSH 0.435 (L) 01/06/2020    Therapeutic Level Labs: No results found for: LITHIUM No results found for: CBMZ No results found for: VALPROATE  Current Medications: Current Outpatient  Medications  Medication Sig Dispense Refill  .  FLUoxetine (PROZAC) 20 MG capsule Take 1 capsule (20 mg total) by mouth daily. 180 capsule 1  . gabapentin (NEURONTIN) 600 MG tablet Take 1 tablet (600 mg total) by mouth 2 (two) times daily. 180 tablet 1  . HYDROcodone-acetaminophen (NORCO) 10-325 MG tablet Take 1 tablet by mouth every 8 (eight) hours as needed. 60 tablet 0  . QUEtiapine (SEROQUEL) 100 MG tablet TAKE 1 AND 1/2 TABLETS(150 MG) BY MOUTH AT BEDTIME 135 tablet 1  . sucralfate (CARAFATE) 1 g tablet Take 1 tablet (1 g total) by mouth 4 (four) times daily -  with meals and at bedtime. 120 tablet 3  . benztropine (COGENTIN) 0.5 MG tablet Take 1 tablet (0.5 mg total) by mouth daily as needed for tremors. Jerks, muscle spasms 30 tablet 1  . cyclobenzaprine (FLEXERIL) 10 MG tablet Take 1 tablet (10 mg total) by mouth at bedtime. (Patient not taking: Reported on 03/23/2020) 30 tablet 2  . lamoTRIgine (LAMICTAL) 25 MG tablet Take 1 tablet (25 mg total) by mouth daily. 30 tablet 1  . ondansetron (ZOFRAN ODT) 4 MG disintegrating tablet Take 1 tablet (4 mg total) by mouth every 8 (eight) hours as needed for nausea or vomiting. (Patient not taking: Reported on 01/06/2020) 45 tablet 6  . scopolamine (TRANSDERM-SCOP, 1.5 MG,) 1 MG/3DAYS Place 1 patch (1.5 mg total) onto the skin every 3 (three) days. (Patient not taking: Reported on 03/23/2020) 10 patch 12  . simvastatin (ZOCOR) 20 MG tablet TAKE 1 TABLET(20 MG) BY MOUTH DAILY (Patient not taking: Reported on 03/23/2020) 90 tablet 1   No current facility-administered medications for this visit.    Musculoskeletal: Strength & Muscle Tone: UTA Gait & Station: normal Patient leans: N/A  Psychiatric Specialty Exam: Review of Systems  HENT:       Increased sensitivity to sound  Psychiatric/Behavioral: The patient is nervous/anxious.        Mood swings  All other systems reviewed and are negative.   There were no vitals taken for this visit.There is  no height or weight on file to calculate BMI.  General Appearance: Casual  Eye Contact:  Fair  Speech:  Normal Rate  Volume:  Normal  Mood:  Anxious and Mood swings  Affect:  Congruent  Thought Process:  Goal Directed and Descriptions of Associations: Intact  Orientation:  Full (Time, Place, and Person)  Thought Content:  Rumination  Suicidal Thoughts:  No  Homicidal Thoughts:  No  Memory:  Immediate;   Fair Recent;   Fair Remote;   Fair  Judgement:  Fair  Insight:  Fair  Psychomotor Activity:  Normal  Concentration:  Concentration: Fair and Attention Span: Fair  Recall:  [employer] [place]: Fair  Akathisia:  No  Handed:  Right  AIMS (if indicated): does report tremors, jerks on and off   Assets:  Communication Skills Desire for Improvement Social Support  ADL's:  Intact  Cognition: WNL  Sleep:  Restless when not using seroqul, also has sleep apnea - noncompliant with CPAP   Screenings: GAD-7     Telemedicine from 03/23/2020 in [facility] Visit from 01/06/2020 in [facility] Video Visit from 09/30/2019 in [facility] Visit from 06/24/2019 in [facility]  Total GAD-7 Score 16 8 0 15    PHQ2-9     Office Visit from 01/06/2020 in [facility] Video Visit from 09/30/2019 in [facility] Visit from 06/24/2019 in [facility]  Practice Office Visit from 06/24/2018 in [facility] Visit from 03/21/2018 in [facility]  PHQ-2 Total Score 1 0 2 0 0  PHQ-9 Total Score 3 2 7 1 3       Assessment and Plan: Carol Becker is a 59 year old Caucasian female, employed, married, lives in [place], has a history of diabetes melitis, bipolar disorder, TMJ, obesity, was evaluated by telemedicine today.  Patient is biologically predisposed given her family history as well as medical problems.  Patient with psychosocial stressors of the  current pandemic.  Patient is currently struggling with anxiety symptoms as well as possible adverse side effects to Seroquel.  Patient is interested in getting off of Seroquel however has severe withdrawal symptoms when she stops taking it.  Discussed plan as noted below.  Plan Bipolar disorder mixed-stable We will start Lamictal 25 mg.  Once she is on a good dosage of Lamictal will try to taper her down from the Seroquel.  This was discussed with patient and she is agreeable. We will continue Seroquel 150 mg p.o. nightly.   GAD-stable Patient continues to have some residual symptoms of anxiety-GAD 7 today 16.  Unknown if her anxiety and restlessness are also due to side effects of Seroquel.  She does report a lot of restlessness and the need to move all the time.  She however is able to function. Lamictal will also help with anxiety.  Will start Lamictal 25 mg p.o. daily. Discussed side effects including Stevens-Johnson syndrome risk. Reduce Prozac to 20 mg p.o. daily If she has worsening anxiety symptoms she will benefit from CBT.  We will consider referral to CBT in the future.  Drug-induced parkinsonian side effects-unstable Will start Cogentin 0.5 mg as needed for severe restlessness/tremors.  I have reviewed TSH in [facility] dated 01/06/2020-0.435.  Patient advised to talk to her primary care provider for recommendations.  Discussed with her the effect of thyroid abnormalities on her mood and sleep.  Patient is noncompliant with her CPAP-discussed with her to talk to her provider regarding the same and encouraged compliance.  I have reviewed EKG dated 12/09/2019-QTC within normal limits.  Follow-up in clinic in 4 to 5 weeks or sooner if needed.  I have spent atleast 45 minutes face to face with patient today. More than 50 % of the time was spent for preparing to see the patient ( e.g., review of test, records ), obtaining and to review and separately obtained history , ordering medications  and test ,psychoeducation and supportive psychotherapy and care coordination,as well as documenting clinical information in electronic health record. This note was generated in part or whole with voice recognition software. Voice recognition is usually quite accurate but there are transcription errors that can and very often do occur. I apologize for any typographical errors that were not detected and corrected.          Ursula Alert, MD 9/14/20212:50 PM

## 2020-03-23 NOTE — Patient Instructions (Addendum)
Benztropine tablets What is this medicine? BENZTROPINE (BENZ troe peen) is for certain movement problems due to Parkinson's disease, certain medicines, or other causes. This medicine may be used for other purposes; ask your health care provider or pharmacist if you have questions. COMMON BRAND NAME(S): Cogentin What should I tell my health care provider before I take this medicine? They need to know if you have any of these conditions:  glaucoma  heart disease or a rapid heartbeat  mental problems  prostate trouble  tardive dyskinesia  an unusual or allergic reaction to benztropine, other medicines, lactose, foods, dyes, or preservatives  pregnant or trying to get pregnant  breast-feeding How should I use this medicine? Take this medicine by mouth with a full glass of water. Follow the directions on the prescription label. Take your medicine at regular intervals. Do not take your medicine more often than directed. Talk to your pediatrician regarding the use of this medicine in children. While this drug may be prescribed for children as young as 3 years for selected conditions, precautions do apply. Overdosage: If you think you have taken too much of this medicine contact a poison control center or emergency room at once. NOTE: This medicine is only for you. Do not share this medicine with others. What if I miss a dose? If you miss a dose, take it as soon as you can. If it is almost time for your next dose, take only that dose. Do not take double or extra doses. What may interact with this medicine?  haloperidol  medicines for movement abnormalities like Parkinson's disease  phenothiazines like chlorpromazine, mesoridazine, prochlorperazine, thioridazine  some antidepressants like amitriptyline, desipramine, doxepin, nortriptyline  stimulant medicines for attention, weight loss, and to stay awake  tegaserod This list may not describe all possible interactions. Give your  health care provider a list of all the medicines, herbs, non-prescription drugs, or dietary supplements you use. Also tell them if you smoke, drink alcohol, or use illegal drugs. Some items may interact with your medicine. What should I watch for while using this medicine? Visit your doctor or health care professional for regular checks on your progress. You may get drowsy or dizzy. Do not drive, use machinery, or do anything that needs mental alertness until you know how this medicine affects you. Do not stand or sit up quickly, especially if you are an older patient. This reduces the risk of dizzy or fainting spells. Alcohol may interfere with the effect of this medicine. Avoid alcoholic drinks. Your mouth may get dry. Chewing sugarless gum or sucking hard candy, and drinking plenty of water may help. Contact your doctor if the problem does not go away or is severe. This medicine may cause dry eyes and blurred vision. If you wear contact lenses you may feel some discomfort. Lubricating drops may help. See your eye doctor if the problem does not go away or is severe. You may sweat less than usual while you are taking this medicine. As a result your body temperature could rise to a dangerous level. Be careful not to get overheated during exercise or in hot weather. You could get heat stroke. Avoid taking hot baths and using hot tubs and saunas. What side effects may I notice from receiving this medicine? Side effects that you should report to your doctor or health care professional as soon as possible:  allergic reactions like skin rash, itching or hives, swelling of the face, lips, or tongue  changes in vision  confusion  decreased sweating or heat intolerance  depression  fast, irregular heartbeat  hallucinations  memory loss  muscle weakness  pain or difficulty passing urine  vomiting Side effects that usually do not require medical attention (report to your doctor or health care  professional if they continue or are bothersome):  constipation  dry mouth  nausea This list may not describe all possible side effects. Call your doctor for medical advice about side effects. You may report side effects to FDA at 1-800-FDA-1088. Where should I keep my medicine? Keep out of the reach of children. Store below 30 degrees C (86 degrees F). Keep container tightly closed. Throw away any unused medicine after the expiration date. NOTE: This sheet is a summary. It may not cover all possible information. If you have questions about this medicine, talk to your doctor, pharmacist, or health care provider.  2020 Elsevier/Gold Standard (2007-09-25 15:38:20) Lamotrigine tablets What is this medicine? LAMOTRIGINE (la MOE Hendricks Limes) is used to control seizures in adults and children with epilepsy and Lennox-Gastaut syndrome. It is also used in adults to treat bipolar disorder. This medicine may be used for other purposes; ask your health care provider or pharmacist if you have questions. COMMON BRAND NAME(S): Lamictal, Subvenite What should I tell my health care provider before I take this medicine? They need to know if you have any of these conditions:  aseptic meningitis during prior use of lamotrigine  depression  folate deficiency  kidney disease  liver disease  suicidal thoughts, plans, or attempt; a previous suicide attempt by you or a family member  an unusual or allergic reaction to lamotrigine or other seizure medications, other medicines, foods, dyes, or preservatives  pregnant or trying to get pregnant  breast-feeding How should I use this medicine? Take this medicine by mouth with a glass of water. Follow the directions on the prescription label. Do not chew these tablets. If this medicine upsets your stomach, take it with food or milk. Take your doses at regular intervals. Do not take your medicine more often than directed. A special MedGuide will be given to  you by the pharmacist with each new prescription and refill. Be sure to read this information carefully each time. Talk to your pediatrician regarding the use of this medicine in children. While this drug may be prescribed for children as young as 2 years for selected conditions, precautions do apply. Overdosage: If you think you have taken too much of this medicine contact a poison control center or emergency room at once. NOTE: This medicine is only for you. Do not share this medicine with others. What if I miss a dose? If you miss a dose, take it as soon as you can. If it is almost time for your next dose, take only that dose. Do not take double or extra doses. What may interact with this medicine?  atazanavir  carbamazepine  female hormones, including contraceptive or birth control pills  lopinavir  methotrexate  phenobarbital  phenytoin  primidone  pyrimethamine  rifampin  ritonavir  trimethoprim  valproic acid This list may not describe all possible interactions. Give your health care provider a list of all the medicines, herbs, non-prescription drugs, or dietary supplements you use. Also tell them if you smoke, drink alcohol, or use illegal drugs. Some items may interact with your medicine. What should I watch for while using this medicine? Visit your doctor or health care provider for regular checks on your progress. If you take this medicine for  seizures, wear a Medic Alert bracelet or necklace. Carry an identification card with information about your condition, medicines, and doctor or health care provider. It is important to take this medicine exactly as directed. When first starting treatment, your dose will need to be adjusted slowly. It may take weeks or months before your dose is stable. You should contact your doctor or health care provider if your seizures get worse or if you have any new types of seizures. Do not stop taking this medicine unless instructed by  your doctor or health care provider. Stopping your medicine suddenly can increase your seizures or their severity. This medicine may cause serious skin reactions. They can happen weeks to months after starting the medicine. Contact your health care provider right away if you notice fevers or flu-like symptoms with a rash. The rash may be red or purple and then turn into blisters or peeling of the skin. Or, you might notice a red rash with swelling of the face, lips or lymph nodes in your neck or under your arms. You may get drowsy, dizzy, or have blurred vision. Do not drive, use machinery, or do anything that needs mental alertness until you know how this medicine affects you. To reduce dizzy or fainting spells, do not sit or stand up quickly, especially if you are an older patient. Alcohol can increase drowsiness and dizziness. Avoid alcoholic drinks. If you are taking this medicine for bipolar disorder, it is important to report any changes in your mood to your doctor or health care provider. If your condition gets worse, you get mentally depressed, feel very hyperactive or manic, have difficulty sleeping, or have thoughts of hurting yourself or committing suicide, you need to get help from your health care provider right away. If you are a caregiver for someone taking this medicine for bipolar disorder, you should also report these behavioral changes right away. The use of this medicine may increase the chance of suicidal thoughts or actions. Pay special attention to how you are responding while on this medicine. Your mouth may get dry. Chewing sugarless gum or sucking hard candy, and drinking plenty of water may help. Contact your doctor if the problem does not go away or is severe. Women who become pregnant while using this medicine may enroll in the [place] Pregnancy Registry by calling [phone]. This registry collects information about the safety of antiepileptic drug use  during pregnancy. This medicine may cause a decrease in folic acid. You should make sure that you get enough folic acid while you are taking this medicine. Discuss the foods you eat and the vitamins you take with your health care provider. What side effects may I notice from receiving this medicine? Side effects that you should report to your doctor or health care professional as soon as possible:  allergic reactions like skin rash, itching or hives, swelling of the face, lips, or tongue  changes in vision  depressed mood  elevated mood, decreased need for sleep, racing thoughts, impulsive behavior  loss of balance or coordination  mouth sores  rash, fever, and swollen lymph nodes  redness, blistering, peeling or loosening of the skin, including inside the mouth  right upper belly pain  seizures  severe muscle pain  signs and symptoms of aseptic meningitis such as stiff neck and sensitivity to light, headache, drowsiness, fever, nausea, vomiting, rash  signs of infection - fever or chills, cough, sore throat, pain or difficulty passing urine  suicidal thoughts or  other mood changes  swollen lymph nodes  trouble walking  unusual bruising or bleeding  unusually weak or tired  yellowing of the eyes or skin Side effects that usually do not require medical attention (report to your doctor or health care professional if they continue or are bothersome):  diarrhea  dizziness  dry mouth  stuffy nose  tiredness  tremors  trouble sleeping This list may not describe all possible side effects. Call your doctor for medical advice about side effects. You may report side effects to FDA at 1-800-FDA-1088. Where should I keep my medicine? Keep out of reach of children. Store at room temperature between 15 and 30 degrees C (59 and 86 degrees F). Throw away any unused medicine after the expiration date. NOTE: This sheet is a summary. It may not cover all possible  information. If you have questions about this medicine, talk to your doctor, pharmacist, or health care provider.  2020 Elsevier/Gold Standard (2018-09-27 15:03:40)

## 2020-04-01 ENCOUNTER — Encounter: Payer: Self-pay | Admitting: Family Medicine

## 2020-04-05 DIAGNOSIS — Z8659 Personal history of other mental and behavioral disorders: Secondary | ICD-10-CM | POA: Insufficient documentation

## 2020-04-05 DIAGNOSIS — F40298 Other specified phobia: Secondary | ICD-10-CM | POA: Insufficient documentation

## 2020-04-05 DIAGNOSIS — H8110 Benign paroxysmal vertigo, unspecified ear: Secondary | ICD-10-CM | POA: Insufficient documentation

## 2020-05-03 ENCOUNTER — Telehealth (INDEPENDENT_AMBULATORY_CARE_PROVIDER_SITE_OTHER): Payer: 59 | Admitting: Psychiatry

## 2020-05-03 ENCOUNTER — Encounter: Payer: Self-pay | Admitting: Psychiatry

## 2020-05-03 ENCOUNTER — Other Ambulatory Visit: Payer: Self-pay

## 2020-05-03 DIAGNOSIS — F3181 Bipolar II disorder: Secondary | ICD-10-CM

## 2020-05-03 DIAGNOSIS — G2119 Other drug induced secondary parkinsonism: Secondary | ICD-10-CM | POA: Diagnosis not present

## 2020-05-03 MED ORDER — QUETIAPINE FUMARATE 100 MG PO TABS: 50.0000 mg | ORAL_TABLET | Freq: Every day | ORAL | 1 refills | Status: DC

## 2020-05-03 MED ORDER — BENZTROPINE MESYLATE 0.5 MG PO TABS: 0.5000 mg | ORAL_TABLET | Freq: Two times a day (BID) | ORAL | 1 refills | Status: DC | PRN

## 2020-05-03 MED ORDER — LAMOTRIGINE 25 MG PO TABS: 25.0000 mg | ORAL_TABLET | Freq: Two times a day (BID) | ORAL | 1 refills | Status: DC

## 2020-05-03 NOTE — Progress Notes (Signed)
Virtual Visit via Video Note  I connected with Carol Becker on 05/03/20 at  3:00 PM EDT by a video enabled telemedicine application and verified that I am speaking with the correct person using two identifiers.  Location Provider Location : [facility] Patient Location : Home  Participants: Patient , Provider   I discussed the limitations of evaluation and management by telemedicine and the availability of in person appointments. The patient expressed understanding and agreed to proceed.     I discussed the assessment and treatment plan with the patient. The patient was provided an opportunity to ask questions and all were answered. The patient agreed with the plan and demonstrated an understanding of the instructions.   The patient was advised to call back or seek an in-person evaluation if the symptoms worsen or if the condition fails to improve as anticipated.   [place] MD OP Progress Note  05/03/2020 3:23 PM Carol Becker  MRN:  [id number]  Chief Complaint:  Chief Complaint    Follow-up     HPI: Carol Becker is a 78-year bipolar disorder type II, drug-induced parkinsonism, TMJ syndrome, obesity, diabetes, sleep apnea was evaluated by telemedicine today.  Patient today reports she has noticed improvement in her mood symptoms since being on the current combination of medication.  She reports she is tolerating the Lamictal well.  Denies side effects.  She reports she is currently taking Seroquel, lower dosage of 100 mg.  She has started taking benztropine and her restlessness and tremors have improved.  She denies side effects.  She also reports she was recently started on nortriptyline by her neurologist.  She reports that has helped her with her phonophobia.  Patient denies any suicidality, homicidality or perceptual disturbances.  She is interested in starting psychotherapy sessions for her anxiety and mood.  She denies any other concerns today.    Visit  Diagnosis:    ICD-10-CM   1. Bipolar 2 disorder (HCC)  F31.81 lamoTRIgine (LAMICTAL) 25 MG tablet    QUEtiapine (SEROQUEL) 100 MG tablet   hypomanic , mixed features  2. Drug-induced parkinsonism ([place])  G21.19 benztropine (COGENTIN) 0.5 MG tablet    Past Psychiatric History: I have reviewed past psychiatric history from my progress note on 03/23/2020  Past Medical History:  Past Medical History:  Diagnosis Date  . Bipolar 1 disorder ([place])   . Cervical spinal stenosis   . GERD (gastroesophageal reflux disease)   . Hyperlipidemia   . Osteopenia   . Sleep apnea   . Thyroid disease     Past Surgical History:  Procedure Laterality Date  . ANTERIOR / POSTERIOR COMBINED FUSION CERVICAL SPINE    . KNEE ARTHROSCOPY WITH EXCISION BAKER'S CYST    . TUBAL LIGATION      Family Psychiatric History: I have reviewed family psychiatric history from my progress note on 03/23/2020  Family History:  Family History  Problem Relation Age of Onset  . Heart disease Father        CAD  . Heart disease Sister 6       CAD  . Cancer Maternal Grandmother        breast  . Breast cancer Maternal Grandmother 86  . Cancer Paternal Grandmother        breast  . Breast cancer Paternal Grandmother 59  . Bipolar disorder Daughter   . Alzheimer's disease Maternal Grandfather   . Heart disease Cousin   . Parkinson's disease Sister     Social History: I  have reviewed social history from my progress note on 03/23/2020 Social History   Socioeconomic History  . Marital status: Married    Spouse name: Not on file  . Number of children: Not on file  . Years of education: Not on file  . Highest education level: Not on file  Occupational History  . Not on file  Tobacco Use  . Smoking status: Former Smoker    Quit date: 04/06/2011    Years since quitting: 9.0  . Smokeless tobacco: Never Used  Vaping Use  . Vaping Use: Every day  Substance and Sexual Activity  . Alcohol use: Yes    Comment: On  Occasion  . Drug use: No  . Sexual activity: Yes    Birth control/protection: Post-menopausal  Other Topics Concern  . Not on file  Social History Narrative  . Not on file   Social Determinants of Health   Financial Resource Strain:   . Difficulty of Paying Living Expenses: Not on file  Food Insecurity:   . Worried About Charity fundraiser in the Last Year: Not on file  . Ran Out of Food in the Last Year: Not on file  Transportation Needs:   . Lack of Transportation (Medical): Not on file  . Lack of Transportation (Non-Medical): Not on file  Physical Activity:   . Days of Exercise per Week: Not on file  . Minutes of Exercise per Session: Not on file  Stress:   . Feeling of Stress : Not on file  Social Connections:   . Frequency of Communication with Friends and Family: Not on file  . Frequency of Social Gatherings with Friends and Family: Not on file  . Attends Religious Services: Not on file  . Active Member of Clubs or Organizations: Not on file  . Attends Archivist Meetings: Not on file  . Marital Status: Not on file    Allergies:  Allergies  Allergen Reactions  . Crestor [Rosuvastatin Calcium] Other (See Comments)    Indigestion  . Rosuvastatin Calcium Other (See Comments)    Indigestion  . Sulfa Antibiotics Nausea Only  . Zithromax [Azithromycin] Other (See Comments)    Not effective    Metabolic Disorder Labs: Lab Results  Component Value Date   HGBA1C 6.8 01/06/2020   No results found for: PROLACTIN Lab Results  Component Value Date   CHOL 165 01/06/2020   TRIG 126 01/06/2020   HDL 51 01/06/2020   VLDL 31 (H) 06/13/2016   LDLCALC 92 01/06/2020   LDLCALC 94 06/24/2019   Lab Results  Component Value Date   TSH 0.435 (L) 01/06/2020   TSH 1.370 06/24/2019    Therapeutic Level Labs: No results found for: LITHIUM No results found for: VALPROATE No components found for:  CBMZ  Current Medications: Current Outpatient Medications   Medication Sig Dispense Refill  . Fluoxetine HCl, PMDD, 20 MG TABS     . benztropine (COGENTIN) 0.5 MG tablet Take 1 tablet (0.5 mg total) by mouth 2 (two) times daily as needed for tremors. Jerks, muscle spasms 60 tablet 1  . cyclobenzaprine (FLEXERIL) 10 MG tablet Take 1 tablet (10 mg total) by mouth at bedtime. (Patient not taking: Reported on 03/23/2020) 30 tablet 2  . gabapentin (NEURONTIN) 600 MG tablet Take 1 tablet (600 mg total) by mouth 2 (two) times daily. 180 tablet 1  . lamoTRIgine (LAMICTAL) 25 MG tablet Take 1 tablet (25 mg total) by mouth 2 (two) times daily. 60 tablet 1  .  nortriptyline (PAMELOR) 10 MG capsule Take 20 mg by mouth.     . ondansetron (ZOFRAN ODT) 4 MG disintegrating tablet Take 1 tablet (4 mg total) by mouth every 8 (eight) hours as needed for nausea or vomiting. (Patient not taking: Reported on 01/06/2020) 45 tablet 6  . QUEtiapine (SEROQUEL) 100 MG tablet Take 0.5 tablets (50 mg total) by mouth at bedtime. Has supplies 135 tablet 1  . scopolamine (TRANSDERM-SCOP, 1.5 MG,) 1 MG/3DAYS Place 1 patch (1.5 mg total) onto the skin every 3 (three) days. (Patient not taking: Reported on 03/23/2020) 10 patch 12  . simvastatin (ZOCOR) 20 MG tablet TAKE 1 TABLET(20 MG) BY MOUTH DAILY (Patient not taking: Reported on 03/23/2020) 90 tablet 1  . sucralfate (CARAFATE) 1 g tablet Take 1 tablet (1 g total) by mouth 4 (four) times daily -  with meals and at bedtime. 120 tablet 3   No current facility-administered medications for this visit.     Musculoskeletal: Strength & Muscle Tone: UTA Gait & Station: normal Patient leans: N/A  Psychiatric Specialty Exam: Review of Systems  Neurological: Positive for tremors.  Psychiatric/Behavioral: The patient is nervous/anxious.   All other systems reviewed and are negative.   There were no vitals taken for this visit.There is no height or weight on file to calculate BMI.  General Appearance: Casual  Eye Contact:  Fair  Speech:   Clear and Coherent  Volume:  Normal  Mood:  Anxious stable  Affect:  Congruent  Thought Process:  Goal Directed and Descriptions of Associations: Intact  Orientation:  Full (Time, Place, and Person)  Thought Content: Logical   Suicidal Thoughts:  No  Homicidal Thoughts:  No  Memory:  Immediate;   Fair Recent;   Fair Remote;   Fair  Judgement:  Good  Insight:  Fair  Psychomotor Activity:  Normal  Concentration:  Concentration: Fair and Attention Span: Good  Recall:  [employer] of Knowledge: Fair  Language: Fair  Akathisia:  No  Handed:  Right  AIMS (if indicated): UTA  Assets:  Communication Skills Desire for Improvement Housing Social Support  ADL's:  Intact  Cognition: WNL  Sleep:  Fair   Screenings: GAD-7     Telemedicine from 03/23/2020 in [place] Office Visit from 01/06/2020 in [facility] Video Visit from 09/30/2019 in [place] Visit from 06/24/2019 in [place]  Total GAD-7 Score 16 8 0 15    PHQ2-9     Office Visit from 01/06/2020 in [facility] Video Visit from 09/30/2019 in [place] Visit from 06/24/2019 in [place] Visit from 06/24/2018 in [place] Visit from 03/21/2018 in [place]  PHQ-2 Total Score 1 0 2 0 0  PHQ-9 Total Score 3 2 7 1 3        Assessment and Plan: Carol Becker is a 45, has a history of bipolar disorder, GAD, multiple medical problems was evaluated by telemedicine today.  Patient will continue to benefit from medication readjustment, is currently making progress.  Plan as noted below.  Plan Bipolar disorder mixed-stable Will increase Lamictal to 25 mg p.o. twice daily. Will reduce Seroquel to 50 mg p.o. nightly.Tapering off.  GAD-stable We will taper off Prozac now that she is on nortriptyline per neurology. She will start taking Prozac 20 mg every other day  for the next 5 days and stop taking it. Provided education about drug to drug interaction.  Referral for CBT  Drug induced parkinsonian side effects-improving Increase Cogentin to 0.5 mg p.o. twice daily. Advised patient to use it as needed due to long-term side effects.  Patient with abnormal TSH labs in June, will benefit from repeat thyroid test.  She will talk to her primary care provider.  Follow-up in clinic in 4 weeks or sooner if needed.  I have spent atleast 20 minutes face to face by video with patient today. More than 50 % of the time was spent for preparing to see the patient ( e.g., review of test, records ),  ordering medications and test ,psychoeducation and supportive psychotherapy and care coordination,as well as documenting clinical information in electronic health record. This note was generated in part or whole with voice recognition software. Voice recognition is usually quite accurate but there are transcription errors that can and very often do occur. I apologize for any typographical errors that were not detected and corrected.       Ursula Alert, MD 05/04/2020, 8:26 AM

## 2020-05-05 ENCOUNTER — Encounter: Payer: Self-pay | Admitting: Family Medicine

## 2020-05-05 DIAGNOSIS — E039 Hypothyroidism, unspecified: Secondary | ICD-10-CM

## 2020-05-06 NOTE — Telephone Encounter (Signed)
Absolutely. Order in.

## 2020-05-13 ENCOUNTER — Encounter: Payer: Self-pay | Admitting: Family Medicine

## 2020-05-13 NOTE — Telephone Encounter (Signed)
Please Advise.  KP

## 2020-05-14 ENCOUNTER — Other Ambulatory Visit: Payer: Self-pay

## 2020-05-14 ENCOUNTER — Encounter: Payer: Self-pay | Admitting: Family Medicine

## 2020-05-14 ENCOUNTER — Ambulatory Visit: Payer: Managed Care, Other (non HMO) | Admitting: Family Medicine

## 2020-05-14 ENCOUNTER — Telehealth: Payer: Self-pay

## 2020-05-14 VITALS — BP 126/83 | HR 91 | Temp 98.7°F | Ht 63.78 in | Wt 215.0 lb

## 2020-05-14 DIAGNOSIS — R682 Dry mouth, unspecified: Secondary | ICD-10-CM | POA: Diagnosis not present

## 2020-05-14 DIAGNOSIS — E039 Hypothyroidism, unspecified: Secondary | ICD-10-CM

## 2020-05-14 DIAGNOSIS — G894 Chronic pain syndrome: Secondary | ICD-10-CM | POA: Diagnosis not present

## 2020-05-14 DIAGNOSIS — E119 Type 2 diabetes mellitus without complications: Secondary | ICD-10-CM

## 2020-05-14 DIAGNOSIS — F3181 Bipolar II disorder: Secondary | ICD-10-CM

## 2020-05-14 DIAGNOSIS — R251 Tremor, unspecified: Secondary | ICD-10-CM

## 2020-05-14 DIAGNOSIS — B370 Candidal stomatitis: Secondary | ICD-10-CM

## 2020-05-14 LAB — BAYER DCA HB A1C WAIVED: HB A1C (BAYER DCA - WAIVED): 6.9 % (ref <7.0)

## 2020-05-14 MED ORDER — NYSTATIN 100000 UNIT/ML MT SUSP: 5.0000 mL | Freq: Four times a day (QID) | OROMUCOSAL | 0 refills | Status: DC

## 2020-05-14 MED ORDER — HYDROCODONE-ACETAMINOPHEN 10-325 MG PO TABS: 1.0000 | ORAL_TABLET | Freq: Three times a day (TID) | ORAL | 0 refills | Status: DC | PRN

## 2020-05-14 MED ORDER — HYDROCODONE-ACETAMINOPHEN 10-325 MG PO TABS: 1.0000 | ORAL_TABLET | Freq: Three times a day (TID) | ORAL | 0 refills | Status: AC | PRN

## 2020-05-14 NOTE — Telephone Encounter (Signed)
pt called left message that she is having issues with her medication and she like to speak with you about her concerns.

## 2020-05-14 NOTE — Patient Instructions (Signed)

## 2020-05-14 NOTE — Assessment & Plan Note (Signed)
Still not doing well. Having lots of side effects. Did not have a great interaction with her psychiatrist. Would like to see someone else. Referral generated today. Call with any concerns.

## 2020-05-14 NOTE — Assessment & Plan Note (Signed)
Under good control on current regimen. Continue current regimen. Continue to monitor. Call with any concerns. Refills given today for 3 months. Follow up 3 months.

## 2020-05-14 NOTE — Assessment & Plan Note (Signed)
Doing well with A1c of 6.9. Continue diet and exercise. Continue to monitor. Call with any concerns.

## 2020-05-14 NOTE — Telephone Encounter (Signed)
Returned call to patient, left voicemail.

## 2020-05-14 NOTE — Progress Notes (Signed)
BP 126/83   Pulse 91   Temp 98.7 F (37.1 C) (Oral)   Ht 5' 3.78" (1.62 m)   Wt 215 lb (97.5 kg)   SpO2 95%   BMI 37.16 kg/m    Subjective:    Patient ID: [name], female    DOB: Sep 12, 1960, 59 y.o.   MRN: [id number]  HPI: Carol Becker is a 59 y.o. female  Chief Complaint  Patient presents with  . Diabetes    Follow up. Declined flu shot today. Still need to put in her vaccine dates when she comes out from the lab.   Has been having a really dry mouth and sinus drainage, has been very constipated. Urine is down. Feels like she is having trouble concentrating and focusing at work. This is significantly worse since starting her medicine with psychiatry.   She is still having tremors, this was only happening with emotional times or exerting herself, but now it's happening all the time. She has stopped the seroquel all together for over a week.    DIABETES Hypoglycemic episodes:no Polydipsia/polyuria: no Visual disturbance: no Chest pain: no Paresthesias: no Glucose Monitoring: no  Accucheck frequency: Not Checking Taking Insulin?: no Blood Pressure Monitoring: not checking Retinal Examination: Up to Date Foot Exam: Up to Date Diabetic Education: Completed Pneumovax: Up to Date Influenza: declined today Aspirin: no  CHRONIC PAIN  Present dose: 20 Morphine equivalents Pain control status: controlled Duration: chronic Location: low back and neck Quality: aching and sore Current Pain Level: mild Previous Pain Level: mild Breakthrough pain: no Benefit from narcotic medications: yes What Activities task can be accomplished with current medication? Able to do her ADLs Interested in weaning off narcotics:yes   Stool softners/OTC fiber: yes  Previous pain specialty evaluation: yes Non-narcotic analgesic meds: yes Narcotic contract: yes  Relevant past medical, surgical, family and social history reviewed and updated as indicated. Interim medical history  since our last visit reviewed. Allergies and medications reviewed and updated.  Review of Systems  Constitutional: Negative.   Respiratory: Negative.   Cardiovascular: Negative.   Gastrointestinal: Negative.   Musculoskeletal: Positive for arthralgias, back pain and myalgias. Negative for gait problem, joint swelling, neck pain and neck stiffness.  Skin: Negative.   Neurological: Positive for tremors. Negative for dizziness, seizures, syncope, facial asymmetry, speech difficulty, weakness, light-headedness, numbness and headaches.  Psychiatric/Behavioral: Positive for agitation, confusion and decreased concentration. Negative for behavioral problems, dysphoric mood, hallucinations, self-injury, sleep disturbance and suicidal ideas. The patient is not nervous/anxious and is not hyperactive.     Per HPI unless specifically indicated above     Objective:    BP 126/83   Pulse 91   Temp 98.7 F (37.1 C) (Oral)   Ht 5' 3.78" (1.62 m)   Wt 215 lb (97.5 kg)   SpO2 95%   BMI 37.16 kg/m   Wt Readings from Last 3 Encounters:  05/14/20 215 lb (97.5 kg)  01/06/20 222 lb (100.7 kg)  12/09/19 220 lb 3.2 oz (99.9 kg)    Physical Exam Vitals and nursing note reviewed.  Constitutional:      General: She is not in acute distress.    Appearance: Normal appearance. She is not ill-appearing, toxic-appearing or diaphoretic.  HENT:     Head: Normocephalic and atraumatic.     Right Ear: External ear normal.     Left Ear: External ear normal.     Nose: Nose normal.     Mouth/Throat:  Mouth: Mucous membranes are moist.     Pharynx: Oropharynx is clear.     Comments: White coating on her tongue Eyes:     General: No scleral icterus.       Right eye: No discharge.        Left eye: No discharge.     Extraocular Movements: Extraocular movements intact.     Conjunctiva/sclera: Conjunctivae normal.     Pupils: Pupils are equal, round, and reactive to light.  Cardiovascular:     Rate and  Rhythm: Normal rate and regular rhythm.     Pulses: Normal pulses.     Heart sounds: Normal heart sounds. No murmur heard.  No friction rub. No gallop.   Pulmonary:     Effort: Pulmonary effort is normal. No respiratory distress.     Breath sounds: Normal breath sounds. No stridor. No wheezing, rhonchi or rales.  Chest:     Chest wall: No tenderness.  Musculoskeletal:        General: Normal range of motion.     Cervical back: Normal range of motion and neck supple.  Skin:    General: Skin is warm and dry.     Capillary Refill: Capillary refill takes less than 2 seconds.     Coloration: Skin is not jaundiced or pale.     Findings: No bruising, erythema, lesion or rash.  Neurological:     General: No focal deficit present.     Mental Status: She is alert and oriented to person, place, and time. Mental status is at baseline.  Psychiatric:        Mood and Affect: Mood normal.        Behavior: Behavior normal.        Thought Content: Thought content normal.        Judgment: Judgment normal.     Results for orders placed or performed in visit on 03/08/20  Fecal occult blood, imunochemical([facility])   Specimen: Stool   ST  Result Value Ref Range   Fecal Occult Bld Negative Negative      Assessment & Plan:   Problem List Items Addressed This Visit      Endocrine   Thyroid activity decreased    Rechecking labs today. Await results. Treat as needed.       Diabetes mellitus without complication ([place]) - Primary    Doing well with A1c of 6.9. Continue diet and exercise. Continue to monitor. Call with any concerns.       Relevant Orders   Bayer DCA Hb A1c Waived     Other   Bipolar 2 disorder ([place])    Still not doing well. Having lots of side effects. Did not have a great interaction with her psychiatrist. Would like to see someone else. Referral generated today. Call with any concerns.       Relevant Orders   Ambulatory referral to Psychiatry   Chronic pain     Under good control on current regimen. Continue current regimen. Continue to monitor. Call with any concerns. Refills given today for 3 months. Follow up 3 months.        Relevant Medications   HYDROcodone-acetaminophen (NORCO) 10-325 MG tablet   HYDROcodone-acetaminophen (NORCO) 10-325 MG tablet (Start on 06/13/2020)   HYDROcodone-acetaminophen (NORCO) 10-325 MG tablet (Start on 07/13/2020)    Other Visit Diagnoses    Dry mouth       Encouraged hard candies and OTC medicines. Conitnue to monitor. Call with any concerns.  Tremor       Continue to follow with neurology. Call with any concerns. Continue to monitor.    Thrush       Will treat with nystatin. Call with any concerns. Continue to monitor.    Relevant Medications   nystatin (MYCOSTATIN) 100000 UNIT/ML suspension       Follow up plan: Return in about 3 months (around 08/14/2020) for physical.

## 2020-05-14 NOTE — Assessment & Plan Note (Signed)
Rechecking labs today. Await results. Treat as needed.  °

## 2020-05-15 LAB — THYROID PANEL WITH TSH
Free Thyroxine Index: 2.9 (ref 1.2–4.9)
T3 Uptake Ratio: 31 % (ref 24–39)
T4, Total: 9.4 ug/dL (ref 4.5–12.0)
TSH: 0.005 u[IU]/mL — ABNORMAL LOW (ref 0.450–4.500)

## 2020-05-17 ENCOUNTER — Encounter: Payer: Self-pay | Admitting: Family Medicine

## 2020-05-17 ENCOUNTER — Other Ambulatory Visit: Payer: Self-pay | Admitting: Family Medicine

## 2020-05-17 ENCOUNTER — Ambulatory Visit: Payer: 59 | Admitting: Licensed Clinical Social Worker

## 2020-05-17 DIAGNOSIS — E0590 Thyrotoxicosis, unspecified without thyrotoxic crisis or storm: Secondary | ICD-10-CM

## 2020-06-07 ENCOUNTER — Other Ambulatory Visit: Payer: Self-pay

## 2020-06-07 ENCOUNTER — Telehealth: Payer: 59 | Admitting: Psychiatry

## 2020-07-06 ENCOUNTER — Encounter: Payer: Self-pay | Admitting: Family Medicine

## 2020-07-06 MED ORDER — SCOPOLAMINE 1 MG/3DAYS TD PT72: 1.0000 | MEDICATED_PATCH | TRANSDERMAL | 0 refills | Status: DC

## 2020-07-16 ENCOUNTER — Encounter: Payer: Managed Care, Other (non HMO) | Admitting: Family Medicine

## 2020-07-22 ENCOUNTER — Other Ambulatory Visit: Payer: Self-pay | Admitting: Family Medicine

## 2020-07-23 ENCOUNTER — Encounter: Payer: Self-pay | Admitting: Family Medicine

## 2020-07-26 ENCOUNTER — Encounter: Payer: Self-pay | Admitting: Family Medicine

## 2020-07-26 ENCOUNTER — Telehealth (INDEPENDENT_AMBULATORY_CARE_PROVIDER_SITE_OTHER): Payer: Managed Care, Other (non HMO) | Admitting: Family Medicine

## 2020-07-26 VITALS — BP 136/90 | HR 85 | Temp 97.1°F | Wt 210.0 lb

## 2020-07-26 DIAGNOSIS — R42 Dizziness and giddiness: Secondary | ICD-10-CM | POA: Insufficient documentation

## 2020-07-26 DIAGNOSIS — H539 Unspecified visual disturbance: Secondary | ICD-10-CM

## 2020-07-26 DIAGNOSIS — R110 Nausea: Secondary | ICD-10-CM

## 2020-07-26 MED ORDER — PROMETHAZINE HCL 25 MG RE SUPP: 25.0000 mg | Freq: Four times a day (QID) | RECTAL | 12 refills | Status: DC | PRN

## 2020-07-26 NOTE — Progress Notes (Signed)
BP 136/90   Pulse 85   Temp (!) 97.1 F (36.2 C)   Wt 210 lb (95.3 kg)   BMI 36.30 kg/m    Subjective:    Patient ID: Carol Becker, female    DOB: August 04, 1960, 60 y.o.   MRN: [id number]  HPI: Carol Becker is a 60 y.o. female  Chief Complaint  Patient presents with  . Follow-up    Pt states she wants to discuss motion sickness. States this happens with any movement she does. States the patches do help some. Feels like it is worse now since she has come off of several mediations.    DIZZINESS Duration: Since November- thought to be due to meds, but didn't seem to change with coming off the meds, seems to be getting worse Description of symptoms: more nauseous feeling, off kilter Duration of episode: hours Dizziness frequency: recurrent, every day Provoking factors: moving eyes, seeing a bunch of small things, riding in a car Triggered by rolling over in bed: no Triggered by bending over: no Aggravated by head movement: yes Aggravated by exertion, coughing, loud noises: no Recent head injury: no Recent or current viral symptoms: no History of vasovagal episodes: no Nausea: yes Vomiting: no Tinnitus: no Hearing loss: no Aural fullness: no Headache: no Photophobia/phonophobia: no Unsteady gait: no Postural instability: no Diplopia, dysarthria, dysphagia or weakness: no Related to exertion: no Pallor: no Diaphoresis: no Dyspnea: no Chest pain: no  Relevant past medical, surgical, family and social history reviewed and updated as indicated. Interim medical history since our last visit reviewed. Allergies and medications reviewed and updated.  Review of Systems  Constitutional: Negative.   HENT: Negative.   Eyes: Positive for visual disturbance. Negative for photophobia, pain, discharge, redness and itching.       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   Cardiovascular:  Negative.   Gastrointestinal: Positive for nausea. Negative for abdominal distention, abdominal pain, anal bleeding, blood in stool, constipation, diarrhea, rectal pain and vomiting.  Musculoskeletal: Positive for arthralgias, back pain, myalgias and neck stiffness. Negative for gait problem, joint swelling and neck pain.  Skin: Negative.   Neurological: Positive for dizziness. Negative for tremors, seizures, syncope, facial asymmetry, speech difficulty, weakness, light-headedness, numbness and headaches.  Psychiatric/Behavioral: Negative.     Per HPI unless specifically indicated above     Objective:    BP 136/90   Pulse 85   Temp (!) 97.1 F (36.2 C)   Wt 210 lb (95.3 kg)   BMI 36.30 kg/m   Wt Readings from Last 3 Encounters:  07/26/20 210 lb (95.3 kg)  05/14/20 215 lb (97.5 kg)  01/06/20 222 lb (100.7 kg)    Physical Exam Vitals and nursing note reviewed.  Constitutional:      General: She is not in acute distress.    Appearance: Normal appearance. She is not ill-appearing, toxic-appearing or diaphoretic.  HENT:     Head: Normocephalic and atraumatic.     Right Ear: External ear normal.     Left Ear: External ear normal.     Nose: Nose normal.     Mouth/Throat:     Mouth: Mucous membranes are moist.     Pharynx: Oropharynx is clear.  Eyes:     General: No scleral icterus.       Right eye: No discharge.        Left eye: No  discharge.     Conjunctiva/sclera: Conjunctivae normal.     Pupils: Pupils are equal, round, and reactive to light.  Pulmonary:     Effort: Pulmonary effort is normal. No respiratory distress.     Comments: Speaking in full sentences Musculoskeletal:        General: Normal range of motion.     Cervical back: Normal range of motion.  Skin:    Coloration: Skin is not jaundiced or pale.     Findings: No bruising, erythema, lesion or rash.  Neurological:     Mental Status: She is alert and oriented to person, place, and time. Mental status is at  baseline.  Psychiatric:        Mood and Affect: Mood normal.        Behavior: Behavior normal.        Thought Content: Thought content normal.        Judgment: Judgment normal.     Results for orders placed or performed in visit on 05/14/20  Bayer DCA Hb A1c Waived  Result Value Ref Range   HB A1C (BAYER DCA - WAIVED) 6.9 <7.0 %  Thyroid Panel With TSH  Result Value Ref Range   TSH 0.005 (L) 0.450 - 4.500 uIU/mL   T4, Total 9.4 4.5 - 12.0 ug/dL   T3 Uptake Ratio 31 24 - 39 %   Free Thyroxine Index 2.9 1.2 - 4.9      Assessment & Plan:   Problem List Items Addressed This Visit      Other   Dizziness - Primary    Chronic, but worsening. 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 Will obtain MRI of brain w and wo contrast. Will treat as below. Follow back up with neurology and ophthalmology. Referral to neuro-ophthalmology made today. Call with any concerns. Continue to monitor closely. Will check labs when she comes in next.       Relevant Orders   MR Brain W Wo Contrast   Ambulatory referral to Neurology    Other Visit Diagnoses    Nausea       Will continue scopolamine patches and use PRN phenergan for nausea. Continue to monitor.    Relevant Orders   MR Brain W Wo Contrast   Ambulatory referral to Neurology   Visual changes       Will get her back in with her eye doctor and obtain MRI of brain. Referal to neuro-opthalmology made today. Call with any concerns.    Relevant Orders   MR Brain W Wo Contrast   Ambulatory referral to Neurology       Follow up plan: Return As scheduled.    . This visit was completed via MyChart due to the restrictions of the COVID-19 pandemic. All issues as above were discussed and addressed. Physical exam was done as above through visual confirmation on MyChart. If it was felt that the patient should be evaluated in the office, they were directed there. The patient verbally consented to this visit. . Location of the patient: home . Location of  the provider: home . Those involved with this call:  . Provider: Park Liter, DO . CMA: Yvonna Alanis, [place] . Front Desk/Registration: Jill Side  . Time spent on call: 25 minutes with patient face to face via video conference. More than 50% of this time was spent in counseling and coordination of care. 64minutes total spent in review of patient's record and preparation of their chart.

## 2020-07-26 NOTE — Assessment & Plan Note (Signed)
Chronic, but worsening. 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 Will obtain MRI of brain w and wo contrast. Will treat as below. Follow back up with neurology and ophthalmology. Referral to neuro-ophthalmology made today. Call with any concerns. Continue to monitor closely. Will check labs when she comes in next.

## 2020-07-29 ENCOUNTER — Telehealth: Payer: Self-pay | Admitting: Psychiatry

## 2020-07-29 NOTE — Telephone Encounter (Signed)
This patient was scheduled to be seen back in 4 weeks however never followed through with recommendation.  Please contact patient, discussed clinic policy.  If noncompliant, please dismiss patient.

## 2020-07-30 NOTE — Telephone Encounter (Signed)
pt has been called twice and left message to contact office to set up an appt.

## 2020-07-30 NOTE — Telephone Encounter (Signed)
Ok thank you. Please make a note to yourself and let me know if you do not hear back in the next few days.

## 2020-08-03 DIAGNOSIS — Z6836 Body mass index (BMI) 36.0-36.9, adult: Secondary | ICD-10-CM | POA: Insufficient documentation

## 2020-08-03 DIAGNOSIS — Z981 Arthrodesis status: Secondary | ICD-10-CM | POA: Insufficient documentation

## 2020-08-03 DIAGNOSIS — R030 Elevated blood-pressure reading, without diagnosis of hypertension: Secondary | ICD-10-CM | POA: Insufficient documentation

## 2020-08-12 ENCOUNTER — Encounter: Payer: Self-pay | Admitting: Family Medicine

## 2020-08-14 DIAGNOSIS — G43109 Migraine with aura, not intractable, without status migrainosus: Secondary | ICD-10-CM | POA: Insufficient documentation

## 2020-08-14 DIAGNOSIS — R110 Nausea: Secondary | ICD-10-CM | POA: Insufficient documentation

## 2020-08-16 MED ORDER — ALPRAZOLAM 0.5 MG PO TABS: ORAL_TABLET | ORAL | 0 refills | Status: DC

## 2020-08-27 ENCOUNTER — Encounter: Payer: Self-pay | Admitting: Family Medicine

## 2020-08-27 ENCOUNTER — Ambulatory Visit (INDEPENDENT_AMBULATORY_CARE_PROVIDER_SITE_OTHER): Payer: Managed Care, Other (non HMO) | Admitting: Family Medicine

## 2020-08-27 ENCOUNTER — Other Ambulatory Visit: Payer: Self-pay

## 2020-08-27 VITALS — BP 130/85 | HR 73 | Temp 98.4°F | Ht 63.5 in | Wt 213.0 lb

## 2020-08-27 DIAGNOSIS — G894 Chronic pain syndrome: Secondary | ICD-10-CM

## 2020-08-27 DIAGNOSIS — E039 Hypothyroidism, unspecified: Secondary | ICD-10-CM | POA: Diagnosis not present

## 2020-08-27 DIAGNOSIS — Z0000 Encounter for general adult medical examination without abnormal findings: Secondary | ICD-10-CM | POA: Diagnosis not present

## 2020-08-27 DIAGNOSIS — E119 Type 2 diabetes mellitus without complications: Secondary | ICD-10-CM | POA: Diagnosis not present

## 2020-08-27 DIAGNOSIS — E782 Mixed hyperlipidemia: Secondary | ICD-10-CM | POA: Diagnosis not present

## 2020-08-27 DIAGNOSIS — G2119 Other drug induced secondary parkinsonism: Secondary | ICD-10-CM

## 2020-08-27 DIAGNOSIS — F3181 Bipolar II disorder: Secondary | ICD-10-CM

## 2020-08-27 DIAGNOSIS — Z1231 Encounter for screening mammogram for malignant neoplasm of breast: Secondary | ICD-10-CM

## 2020-08-27 LAB — MICROSCOPIC EXAMINATION
Cast Type: NONE SEEN
Casts: NONE SEEN /lpf
Crystal Type: NONE SEEN
Crystals: NONE SEEN
RBC, Urine: NONE SEEN /hpf (ref 0–2)
Renal Epithel, UA: NONE SEEN /hpf
Trichomonas, UA: NONE SEEN
Yeast, UA: NONE SEEN

## 2020-08-27 LAB — MICROALBUMIN, URINE WAIVED
Creatinine, Urine Waived: 200 mg/dL (ref 10–300)
Microalb, Ur Waived: 30 mg/L — ABNORMAL HIGH (ref 0–19)
Microalb/Creat Ratio: <30 mg/g (ref <30)

## 2020-08-27 LAB — BAYER DCA HB A1C WAIVED: HB A1C (BAYER DCA - WAIVED): 7.9 % — ABNORMAL HIGH (ref <7.0)

## 2020-08-27 LAB — URINALYSIS, ROUTINE W REFLEX MICROSCOPIC
Bilirubin, UA: NEGATIVE
Glucose, UA: NEGATIVE
Ketones, UA: NEGATIVE
Nitrite, UA: NEGATIVE
Protein,UA: NEGATIVE
RBC, UA: NEGATIVE
Specific Gravity, UA: 1.02 (ref 1.005–1.030)
Urobilinogen, Ur: 0.2 mg/dL (ref 0.2–1.0)
pH, UA: 7 (ref 5.0–7.5)

## 2020-08-27 MED ORDER — HYDROCODONE-ACETAMINOPHEN 10-325 MG PO TABS: 1.0000 | ORAL_TABLET | Freq: Two times a day (BID) | ORAL | 0 refills | Status: AC | PRN

## 2020-08-27 MED ORDER — BUSPIRONE HCL 7.5 MG PO TABS: 7.5000 mg | ORAL_TABLET | Freq: Three times a day (TID) | ORAL | 2 refills | Status: DC

## 2020-08-27 NOTE — Progress Notes (Signed)
BP 130/85   Pulse 73   Temp 98.4 F (36.9 C) (Oral)   Ht 5' 3.5" (1.613 m)   Wt 213 lb (96.6 kg)   SpO2 97%   BMI 37.14 kg/m    Subjective:    Patient ID: Carol Becker, female    DOB: Aug 20, 1960, 60 y.o.   MRN: [id number]  HPI: Carol Becker is a 60 y.o. female presenting on 08/27/2020 for comprehensive medical examination. Current medical complaints include:  Has been having issues with dizziness. 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 They started her on nortriptyline.   DIABETES Hypoglycemic episodes:no Polydipsia/polyuria: no Visual disturbance: no Chest pain: no Paresthesias: no Glucose Monitoring: no  Accucheck frequency: Not Checking Taking Insulin?: no Blood Pressure Monitoring: not checking Retinal Examination: Not up to Date Foot Exam: Up to Date Diabetic Education: Completed Pneumovax: Up to Date Influenza: Not up to Date Aspirin: no  HYPERTHYROIDISM Thyroid control status:unsure Satisfied with current treatment? yes Medication side effects: no Medication compliance: excellent compliance Recent dose adjustment: about a month Fatigue: no Cold intolerance: no Heat intolerance: no Weight gain: no Weight loss: yes Constipation: no Diarrhea/loose stools: no Palpitations: no Lower extremity edema: no Anxiety/depressed mood: yes  HYPERLIPIDEMIA Hyperlipidemia status: stable Satisfied with current treatment?  yes Side effects:  Not on anything Past cholesterol meds: none Supplements: none Aspirin:  no The 10-year ASCVD risk score Mikey Bussing DC Jr., et al., 2013) is: 5.5%   Values used to calculate the score:     Age: 26 years     Sex: Female     Is Non-Hispanic African American: No     Diabetic: Yes     Tobacco smoker: No     Systolic Blood Pressure: 917 mmHg     Is BP treated: No     HDL Cholesterol: 49 mg/dL     Total Cholesterol: 188 mg/dL Chest pain:  no Coronary artery disease:   no  ANXIETY/STRESS Duration: about 3 weeks Status:exacerbated Anxious mood: yes  Excessive worrying: yes Irritability: yes  Sweating: no Nausea: no Palpitations:no Hyperventilation: no Panic attacks: no Agoraphobia: no  Obscessions/compulsions: no Depressed mood: no Depression screen [facility] 2/9 05/14/2020 01/08/2020 09/30/2019 06/29/2019 06/24/2018  Decreased Interest 0 1 0 2 0  Down, Depressed, Hopeless 0 0 0 0 0  PHQ - 2 Score 0 1 0 2 0  Altered sleeping 0 0 0 2 0  Tired, decreased energy 0 1 2 2 1   Change in appetite 0 1 0 0 0  Feeling bad or failure about yourself  0 0 0 0 0  Trouble concentrating 0 0 0 1 0  Moving slowly or fidgety/restless 0 0 0 0 0  Suicidal thoughts 0 0 0 0 0  PHQ-9 Score 0 3 2 7 1   Difficult doing work/chores Not difficult at all Somewhat difficult Not difficult at all Somewhat difficult Not difficult at all   Anhedonia: no Weight changes: no Insomnia: no   Hypersomnia: no Fatigue/loss of energy: no Feelings of worthlessness: no Feelings of guilt: no Impaired concentration/indecisiveness: no Suicidal ideations: no  Crying spells: yes Recent Stressors/Life Changes: no   Relationship problems: no   Family stress: no     Financial stress: no    Job stress: no    Recent death/loss: no  CHRONIC PAIN  Present dose: 20 Morphine equivalents Pain control status: controlled Duration: chronic Location: low back and neck Quality: aching and sore Current Pain Level: mild  Previous Pain Level: mild Breakthrough pain: no Benefit from narcotic medications: yes What Activities task can be accomplished with current medication? Able to do her adls Interested in weaning off narcotics:yes   Stool softners/OTC fiber: no  Previous pain specialty evaluation: no Non-narcotic analgesic meds: no Narcotic contract: yes  She currently lives with: husband Menopausal Symptoms: no  Depression Screen done today and results listed below:  Depression screen [facility] 2/9  05/14/2020 01/08/2020 09/30/2019 06/29/2019 06/24/2018  Decreased Interest 0 1 0 2 0  Down, Depressed, Hopeless 0 0 0 0 0  PHQ - 2 Score 0 1 0 2 0  Altered sleeping 0 0 0 2 0  Tired, decreased energy 0 1 2 2 1   Change in appetite 0 1 0 0 0  Feeling bad or failure about yourself  0 0 0 0 0  Trouble concentrating 0 0 0 1 0  Moving slowly or fidgety/restless 0 0 0 0 0  Suicidal thoughts 0 0 0 0 0  PHQ-9 Score 0 3 2 7 1   Difficult doing work/chores Not difficult at all Somewhat difficult Not difficult at all Somewhat difficult Not difficult at all    Past Medical History:  Past Medical History:  Diagnosis Date  . Bipolar 1 disorder ([place])   . Cervical spinal stenosis   . GERD (gastroesophageal reflux disease)   . Hyperlipidemia   . Osteopenia   . Sleep apnea   . Thyroid disease     Surgical History:  Past Surgical History:  Procedure Laterality Date  . ANTERIOR / POSTERIOR COMBINED FUSION CERVICAL SPINE    . KNEE ARTHROSCOPY WITH EXCISION BAKER'S CYST    . trapeziectomy     done [facility]/16/20  . TUBAL LIGATION      Medications:  Current Outpatient Medications on File Prior to Visit  Medication Sig  . gabapentin (NEURONTIN) 600 MG tablet TAKE 1 TABLET(600 MG) BY MOUTH TWICE DAILY  . methimazole (TAPAZOLE) 5 MG tablet methimazole 5 mg tablet  . methocarbamol (ROBAXIN) 500 MG tablet TAKE 1 TO 2 TABLETS BY MOUTH FOUR TIMES DAILY FOR MUSCLE SPASMS OR TIGHTNESS  . nortriptyline (PAMELOR) 10 MG capsule Take 20 mg by mouth.   No current facility-administered medications on file prior to visit.    Allergies:  Allergies  Allergen Reactions  . Crestor [Rosuvastatin Calcium] Other (See Comments)    Indigestion  . Rosuvastatin Calcium Other (See Comments)    Indigestion  . Sulfa Antibiotics Nausea Only  . Zithromax [Azithromycin] Other (See Comments)    Not effective    Social History:  Social History   Socioeconomic History  . Marital status: Married    Spouse name: Not  on file  . Number of children: Not on file  . Years of education: Not on file  . Highest education level: Not on file  Occupational History  . Not on file  Tobacco Use  . Smoking status: Former Smoker    Quit date: 04/06/2011    Years since quitting: 9.4  . Smokeless tobacco: Never Used  Vaping Use  . Vaping Use: Former  Substance and Sexual Activity  . Alcohol use: Yes    Comment: On Occasion  . Drug use: No  . Sexual activity: Yes    Birth control/protection: Post-menopausal  Other Topics Concern  . Not on file  Social History Narrative  . Not on file   Social Determinants of Health   Financial Resource Strain: Not on file  Food Insecurity: Not  on file  Transportation Needs: Not on file  Physical Activity: Not on file  Stress: Not on file  Social Connections: Not on file  Intimate Partner Violence: Not on file   Social History   Tobacco Use  Smoking Status Former Smoker  . Quit date: 04/06/2011  . Years since quitting: 9.4  Smokeless Tobacco Never Used   Social History   Substance and Sexual Activity  Alcohol Use Yes   Comment: On Occasion    Family History:  Family History  Problem Relation Age of Onset  . Heart disease Father        CAD  . Heart disease Sister 20       CAD  . Cancer Maternal Grandmother        breast  . Breast cancer Maternal Grandmother 34  . Cancer Paternal Grandmother        breast  . Breast cancer Paternal Grandmother 55  . Bipolar disorder Daughter   . Alzheimer's disease Maternal Grandfather   . Heart disease Cousin   . Parkinson's disease Sister     Past medical history, surgical history, medications, allergies, family history and social history reviewed with patient today and changes made to appropriate areas of the chart.   Review of Systems  Constitutional: Positive for diaphoresis and malaise/fatigue. Negative for chills, fever and weight loss.  HENT: Negative.   Eyes: Negative.   Respiratory: Negative.    Cardiovascular: Positive for palpitations. Negative for chest pain, orthopnea, claudication, leg swelling and PND.  Gastrointestinal: Positive for abdominal pain, constipation and heartburn. Negative for blood in stool, diarrhea, melena, nausea and vomiting.  Genitourinary: Positive for frequency. Negative for dysuria, flank pain, hematuria and urgency.  Musculoskeletal: Positive for neck pain. Negative for back pain, falls, joint pain and myalgias.  Skin: Negative.   Neurological: Positive for dizziness. Negative for tingling, tremors, sensory change, speech change, focal weakness, seizures, loss of consciousness, weakness and headaches.  Endo/Heme/Allergies: Negative.   Psychiatric/Behavioral: Negative for depression, hallucinations, memory loss, substance abuse and suicidal ideas. The patient is nervous/anxious. The patient does not have insomnia.    All other ROS negative except what is listed above and in the HPI.      Objective:    BP 130/85   Pulse 73   Temp 98.4 F (36.9 C) (Oral)   Ht 5' 3.5" (1.613 m)   Wt 213 lb (96.6 kg)   SpO2 97%   BMI 37.14 kg/m   Wt Readings from Last 3 Encounters:  08/27/20 213 lb (96.6 kg)  07/26/20 210 lb (95.3 kg)  05/14/20 215 lb (97.5 kg)    Physical Exam Vitals and nursing note reviewed.  Constitutional:      General: She is not in acute distress.    Appearance: Normal appearance. She is not ill-appearing, toxic-appearing or diaphoretic.  HENT:     Head: Normocephalic and atraumatic.     Right Ear: Tympanic membrane, ear canal and external ear normal. There is no impacted cerumen.     Left Ear: Tympanic membrane, ear canal and external ear normal. There is no impacted cerumen.     Nose: Nose normal. No congestion or rhinorrhea.     Mouth/Throat:     Mouth: Mucous membranes are moist.     Pharynx: Oropharynx is clear. No oropharyngeal exudate or posterior oropharyngeal erythema.  Eyes:     General: No scleral icterus.       Right  eye: No discharge.  Left eye: No discharge.     Extraocular Movements: Extraocular movements intact.     Conjunctiva/sclera: Conjunctivae normal.     Pupils: Pupils are equal, round, and reactive to light.  Neck:     Vascular: No carotid bruit.  Cardiovascular:     Rate and Rhythm: Normal rate and regular rhythm.     Pulses: Normal pulses.     Heart sounds: No murmur heard. No friction rub. No gallop.   Pulmonary:     Effort: Pulmonary effort is normal. No respiratory distress.     Breath sounds: Normal breath sounds. No stridor. No wheezing, rhonchi or rales.  Chest:     Chest wall: No tenderness.  Abdominal:     General: Abdomen is flat. Bowel sounds are normal. There is no distension.     Palpations: Abdomen is soft. There is no mass.     Tenderness: There is no abdominal tenderness. There is no right CVA tenderness, left CVA tenderness, guarding or rebound.     Hernia: No hernia is present.  Genitourinary:    Comments: Breast and pelvic exams deferred with shared decision making Musculoskeletal:        General: No swelling, tenderness, deformity or signs of injury.     Cervical back: Normal range of motion and neck supple. No rigidity. No muscular tenderness.     Right lower leg: No edema.     Left lower leg: No edema.  Lymphadenopathy:     Cervical: No cervical adenopathy.  Skin:    General: Skin is warm and dry.     Capillary Refill: Capillary refill takes less than 2 seconds.     Coloration: Skin is not jaundiced or pale.     Findings: No bruising, erythema, lesion or rash.  Neurological:     General: No focal deficit present.     Mental Status: She is alert and oriented to person, place, and time. Mental status is at baseline.     Cranial Nerves: No cranial nerve deficit.     Sensory: No sensory deficit.     Motor: No weakness.     Coordination: Coordination normal.     Gait: Gait normal.     Deep Tendon Reflexes: Reflexes normal.  Psychiatric:        Mood  and Affect: Mood normal.        Behavior: Behavior normal.        Thought Content: Thought content normal.        Judgment: Judgment normal.     Results for orders placed or performed in visit on 08/27/20  Microscopic Examination   BLD  Result Value Ref Range   WBC, UA 0-5 0 - 5 /hpf   RBC None seen 0 - 2 /hpf   Epithelial Cells (non renal) 0-10 0 - 10 /hpf   Renal Epithel, UA None seen None seen /hpf   Casts None seen None seen /lpf   Cast Type None seen N/A   Crystals None seen N/A   Crystal Type None seen N/A   Mucus, UA Present Not Estab.   Bacteria, UA Few None seen/Few   Yeast, UA None seen None seen   Trichomonas, UA None seen None seen  CBC with Differential/Platelet  Result Value Ref Range   WBC 7.0 3.4 - 10.8 x10E3/uL   RBC 4.83 3.77 - 5.28 x10E6/uL   Hemoglobin 14.3 11.1 - 15.9 g/dL   Hematocrit 43.0 34.0 - 46.6 %   MCV 89 79 -  97 fL   MCH 29.6 26.6 - 33.0 pg   MCHC 33.3 31.5 - 35.7 g/dL   RDW 12.5 11.7 - 15.4 %   Platelets 261 150 - 450 x10E3/uL   Neutrophils 56 Not Estab. %   Lymphs 31 Not Estab. %   Monocytes 8 Not Estab. %   Eos 4 Not Estab. %   Basos 1 Not Estab. %   Neutrophils Absolute 4.0 1.4 - 7.0 x10E3/uL   Lymphocytes Absolute 2.1 0.7 - 3.1 x10E3/uL   Monocytes Absolute 0.5 0.1 - 0.9 x10E3/uL   EOS (ABSOLUTE) 0.3 0.0 - 0.4 x10E3/uL   Basophils Absolute 0.1 0.0 - 0.2 x10E3/uL   Immature Granulocytes 0 Not Estab. %   Immature Grans (Abs) 0.0 0.0 - 0.1 x10E3/uL  Comprehensive metabolic panel  Result Value Ref Range   Glucose 175 (H) 65 - 99 mg/dL   BUN 11 6 - 24 mg/dL   Creatinine, Ser 0.81 0.57 - 1.00 mg/dL   GFR calc non Af Amer 80 >59 mL/min/1.73   GFR calc Af Amer 93 >59 mL/min/1.73   BUN/Creatinine Ratio 14 9 - 23   Sodium 136 134 - 144 mmol/L   Potassium 4.3 3.5 - 5.2 mmol/L   Chloride 100 96 - 106 mmol/L   CO2 22 20 - 29 mmol/L   Calcium 9.9 8.7 - 10.2 mg/dL   Total Protein 7.1 6.0 - 8.5 g/dL   Albumin 4.4 3.8 - 4.9 g/dL    Globulin, Total 2.7 1.5 - 4.5 g/dL   Albumin/Globulin Ratio 1.6 1.2 - 2.2   Bilirubin Total 0.3 0.0 - 1.2 mg/dL   Alkaline Phosphatase 114 44 - 121 IU/L   AST 46 (H) 0 - 40 IU/L   ALT 50 (H) 0 - 32 IU/L  Lipid Panel w/o Chol/HDL Ratio  Result Value Ref Range   Cholesterol, Total 188 100 - 199 mg/dL   Triglycerides 118 0 - 149 mg/dL   HDL 49 >39 mg/dL   VLDL Cholesterol Cal 21 5 - 40 mg/dL   LDL Chol Calc (NIH) 118 (H) 0 - 99 mg/dL  Urinalysis, Routine w reflex microscopic  Result Value Ref Range   Specific Gravity, UA 1.020 1.005 - 1.030   pH, UA 7.0 5.0 - 7.5   Color, UA Yellow Yellow   Appearance Ur Clear Clear   Leukocytes,UA Trace (A) Negative   Protein,UA Negative Negative/Trace   Glucose, UA Negative Negative   Ketones, UA Negative Negative   RBC, UA Negative Negative   Bilirubin, UA Negative Negative   Urobilinogen, Ur 0.2 0.2 - 1.0 mg/dL   Nitrite, UA Negative Negative   Microscopic Examination See below:   TSH  Result Value Ref Range   TSH 0.091 (L) 0.450 - 4.500 uIU/mL  Bayer DCA Hb A1c Waived  Result Value Ref Range   HB A1C (BAYER DCA - WAIVED) 7.9 (H) <7.0 %  Microalbumin, Urine Waived  Result Value Ref Range   Microalb, Ur Waived 30 (H) 0 - 19 mg/L   Creatinine, Urine Waived 200 10 - 300 mg/dL   Microalb/Creat Ratio <30 <30 mg/g      Assessment & Plan:   Problem List Items Addressed This Visit      Endocrine   Thyroid activity decreased    Seeing endocrine. Just started on methimazole. Rechecking labs today. Await results.       Relevant Medications   methimazole (TAPAZOLE) 5 MG tablet   Other Relevant Orders   CBC with Differential/Platelet (  Completed)   Comprehensive metabolic panel (Completed)   TSH (Completed)   Diabetes mellitus without complication (HCC)    Not under good control with A1c of 7.9. She has been eating a lot of sweets. She will cut down and we'll recheck 3 months. Call with any concerns.       Relevant Orders   CBC with  Differential/Platelet (Completed)   Comprehensive metabolic panel (Completed)   Urinalysis, Routine w reflex microscopic (Completed)   Bayer DCA Hb A1c Waived (Completed)   Microalbumin, Urine Waived (Completed)     Nervous and Auditory   Drug-induced parkinsonism (HCC)    Continue to follow with neurology. Call with any concerns. Continue to monitor.       Relevant Orders   CBC with Differential/Platelet (Completed)   Comprehensive metabolic panel (Completed)     Other   Hyperlipidemia    Rechecking levels. Continue to monitor. Call with any concerns.       Relevant Orders   CBC with Differential/Platelet (Completed)   Comprehensive metabolic panel (Completed)   Lipid Panel w/o Chol/HDL Ratio (Completed)   Bipolar 2 disorder (HCC)    Doing well. No concerns. Continue to monitor.       Relevant Orders   CBC with Differential/Platelet (Completed)   Comprehensive metabolic panel (Completed)   Chronic pain    Under good control on current regimen. Continue current regimen. Continue to monitor. Call with any concerns. Refills given for 3 months. Follow up 3 months.         Relevant Medications   methocarbamol (ROBAXIN) 500 MG tablet   HYDROcodone-acetaminophen (NORCO) 10-325 MG tablet   HYDROcodone-acetaminophen (NORCO) 10-325 MG tablet (Start on 10/26/2020)   HYDROcodone-acetaminophen (NORCO) 10-325 MG tablet (Start on 09/26/2020)   Other Relevant Orders   CBC with Differential/Platelet (Completed)   Comprehensive metabolic panel (Completed)    Other Visit Diagnoses    Routine general medical examination at a health care facility    -  Primary   Vaccines up to date. Screening labs checked today. Pap up to date. Mammogram ordered. Continue diet and exercise. Call with any concerns.    Relevant Orders   CBC with Differential/Platelet (Completed)   Comprehensive metabolic panel (Completed)   Lipid Panel w/o Chol/HDL Ratio (Completed)   Urinalysis, Routine w reflex  microscopic (Completed)   TSH (Completed)   Encounter for screening mammogram for malignant neoplasm of breast       Mammogram ordered today.   Relevant Orders   MM 3D SCREEN BREAST BILATERAL       Follow up plan: Return in about 3 months (around 11/24/2020).   LABORATORY TESTING:  - Pap smear: up to date  IMMUNIZATIONS:   - Tdap: Tetanus vaccination status reviewed: last tetanus booster within 10 years. - Influenza: Refused - Pneumovax: Up to date - Prevnar: Refused - COVID: Up to date  SCREENING: -Mammogram: Ordered today  - Colonoscopy: Up to date  - Bone Density: Not applicable   PATIENT COUNSELING:   Advised to take 1 mg of folate supplement per day if capable of pregnancy.   Sexuality: Discussed sexually transmitted diseases, partner selection, use of condoms, avoidance of unintended pregnancy  and contraceptive alternatives.   Advised to avoid cigarette smoking.  I discussed with the patient that most people either abstain from alcohol or drink within safe limits (<=14/week and <=4 drinks/occasion for males, <=7/weeks and <= 3 drinks/occasion for females) and that the risk for alcohol disorders and other health effects rises  proportionally with the number of drinks per week and how often a drinker exceeds daily limits.  Discussed cessation/primary prevention of drug use and availability of treatment for abuse.   Diet: Encouraged to adjust caloric intake to maintain  or achieve ideal body weight, to reduce intake of dietary saturated fat and total fat, to limit sodium intake by avoiding high sodium foods and not adding table salt, and to maintain adequate dietary potassium and calcium preferably from fresh fruits, vegetables, and low-fat dairy products.    stressed the importance of regular exercise  Injury prevention: Discussed safety belts, safety helmets, smoke detector, smoking near bedding or upholstery.   Dental health: Discussed importance of regular tooth  brushing, flossing, and dental visits.    NEXT PREVENTATIVE PHYSICAL DUE IN 1 YEAR. Return in about 3 months (around 11/24/2020).

## 2020-08-27 NOTE — Patient Instructions (Signed)
Health Maintenance, Female Adopting a healthy lifestyle and getting preventive care are important in promoting health and wellness. Ask your health care provider about:  The right schedule for you to have regular tests and exams.  Things you can do on your own to prevent diseases and keep yourself healthy. What should I know about diet, weight, and exercise? Eat a healthy diet  Eat a diet that includes plenty of vegetables, fruits, low-fat dairy products, and lean protein.  Do not eat a lot of foods that are high in solid fats, added sugars, or sodium.   Maintain a healthy weight Body mass index (BMI) is used to identify weight problems. It estimates body fat based on height and weight. Your health care provider can help determine your BMI and help you achieve or maintain a healthy weight. Get regular exercise Get regular exercise. This is one of the most important things you can do for your health. Most adults should:  Exercise for at least 150 minutes each week. The exercise should increase your heart rate and make you sweat (moderate-intensity exercise).  Do strengthening exercises at least twice a week. This is in addition to the moderate-intensity exercise.  Spend less time sitting. Even light physical activity can be beneficial. Watch cholesterol and blood lipids Have your blood tested for lipids and cholesterol at 59 years of age, then have this test every 5 years. Have your cholesterol levels checked more often if:  Your lipid or cholesterol levels are high.  You are older than 59 years of age.  You are at high risk for heart disease. What should I know about cancer screening? Depending on your health history and family history, you may need to have cancer screening at various ages. This may include screening for:  Breast cancer.  Cervical cancer.  Colorectal cancer.  Skin cancer.  Lung cancer. What should I know about heart disease, diabetes, and high blood  pressure? Blood pressure and heart disease  High blood pressure causes heart disease and increases the risk of stroke. This is more likely to develop in people who have high blood pressure readings, are of African descent, or are overweight.  Have your blood pressure checked: ? Every 3-5 years if you are 65-85 years of age. ? Every year if you are 27 years old or older. Diabetes Have regular diabetes screenings. This checks your fasting blood sugar level. Have the screening done:  Once every three years after age 79 if you are at a normal weight and have a low risk for diabetes.  More often and at a younger age if you are overweight or have a high risk for diabetes. What should I know about preventing infection? Hepatitis B If you have a higher risk for hepatitis B, you should be screened for this virus. Talk with your health care provider to find out if you are at risk for hepatitis B infection. Hepatitis C Testing is recommended for:  Everyone born from [birth year] through 1964.  Anyone with known risk factors for hepatitis C. Sexually transmitted infections (STIs)  Get screened for STIs, including gonorrhea and chlamydia, if: ? You are sexually active and are younger than 59 years of age. ? You are older than 59 years of age and your health care provider tells you that you are at risk for this type of infection. ? Your sexual activity has changed since you were last screened, and you are at increased risk for chlamydia or gonorrhea. Ask your health care provider  if you are at risk.  Ask your health care provider about whether you are at high risk for HIV. Your health care provider may recommend a prescription medicine to help prevent HIV infection. If you choose to take medicine to prevent HIV, you should first get tested for HIV. You should then be tested every 3 months for as long as you are taking the medicine. Pregnancy  If you are about to stop having your period (premenopausal) and  you may become pregnant, seek counseling before you get pregnant.  Take 400 to 800 micrograms (mcg) of folic acid every day if you become pregnant.  Ask for birth control (contraception) if you want to prevent pregnancy. Osteoporosis and menopause Osteoporosis is a disease in which the bones lose minerals and strength with aging. This can result in bone fractures. If you are 75 years old or older, or if you are at risk for osteoporosis and fractures, ask your health care provider if you should:  Be screened for bone loss.  Take a calcium or vitamin D supplement to lower your risk of fractures.  Be given hormone replacement therapy (HRT) to treat symptoms of menopause. Follow these instructions at home: Lifestyle  Do not use any products that contain nicotine or tobacco, such as cigarettes, e-cigarettes, and chewing tobacco. If you need help quitting, ask your health care provider.  Do not use street drugs.  Do not share needles.  Ask your health care provider for help if you need support or information about quitting drugs. Alcohol use  Do not drink alcohol if: ? Your health care provider tells you not to drink. ? You are pregnant, may be pregnant, or are planning to become pregnant.  If you drink alcohol: ? Limit how much you use to 0-1 drink a day. ? Limit intake if you are breastfeeding.  Be aware of how much alcohol is in your drink. In the [place] one drink equals one 12 oz bottle of beer (355 mL), one 5 oz glass of wine (148 mL), or one 1 oz glass of hard liquor (44 mL). General instructions  Schedule regular health, dental, and eye exams.  Stay current with your vaccines.  Tell your health care provider if: ? You often feel depressed. ? You have ever been abused or do not feel safe at home. Summary  Adopting a healthy lifestyle and getting preventive care are important in promoting health and wellness.  Follow your health care provider's instructions about healthy  diet, exercising, and getting tested or screened for diseases.  Follow your health care provider's instructions on monitoring your cholesterol and blood pressure. This information is not intended to replace advice given to you by your health care provider. Make sure you discuss any questions you have with your health care provider. Document Revised: 06/19/2018 Document Reviewed: 06/19/2018 Elsevier Patient Education  [place].  Diabetes Mellitus Basics  Diabetes mellitus, or diabetes, is a long-term (chronic) disease. It occurs when the body does not properly use sugar (glucose) that is released from food after you eat. Diabetes mellitus may be caused by one or both of these problems:  Your pancreas does not make enough of a hormone called insulin.  Your body does not react in a normal way to the insulin that it makes. Insulin lets glucose enter cells in your body. This gives you energy. If you have diabetes, glucose cannot get into cells. This causes high blood glucose (hyperglycemia). How to treat and manage diabetes You may  need to take insulin or other diabetes medicines daily to keep your glucose in balance. If you are prescribed insulin, you will learn how to give yourself insulin by injection. You may need to adjust the amount of insulin you take based on the foods that you eat. You will need to check your blood glucose levels using a glucose monitor as told by your health care provider. The readings can help determine if you have low or high blood glucose. Generally, you should have these blood glucose levels:  Before meals (preprandial): 80-130 mg/dL (4.4-7.2 mmol/L).  After meals (postprandial): below 180 mg/dL (10 mmol/L).  Hemoglobin A1c (HbA1c) level: less than 7%. Your health care provider will set treatment goals for you. Keep all follow-up visits. This is important. Follow these instructions at home: Diabetes medicines Take your diabetes medicines every day as  told by your health care provider. List your diabetes medicines here:  Name of medicine: ______________________________ ? Amount (dose): _______________ Time (a.m./p.m.): _______________ Notes: ___________________________________  Name of medicine: ______________________________ ? Amount (dose): _______________ Time (a.m./p.m.): _______________ Notes: ___________________________________  Name of medicine: ______________________________ ? Amount (dose): _______________ Time (a.m./p.m.): _______________ Notes: ___________________________________ Insulin If you use insulin, list the types of insulin you use here:  Insulin type: ______________________________ ? Amount (dose): _______________ Time (a.m./p.m.): _______________Notes: ___________________________________  Insulin type: ______________________________ ? Amount (dose): _______________ Time (a.m./p.m.): _______________ Notes: ___________________________________  Insulin type: ______________________________ ? Amount (dose): _______________ Time (a.m./p.m.): _______________ Notes: ___________________________________  Insulin type: ______________________________ ? Amount (dose): _______________ Time (a.m./p.m.): _______________ Notes: ___________________________________  Insulin type: ______________________________ ? Amount (dose): _______________ Time (a.m./p.m.): _______________ Notes: ___________________________________ Managing blood glucose Check your blood glucose levels using a glucose monitor as told by your health care provider. Write down the times that you check your glucose levels here:  Time: _______________ Notes: ___________________________________  Time: _______________ Notes: ___________________________________  Time: _______________ Notes: ___________________________________  Time: _______________ Notes: ___________________________________  Time: _______________ Notes:  ___________________________________  Time: _______________ Notes: ___________________________________   Low blood glucose Low blood glucose (hypoglycemia) is when glucose is at or below 70 mg/dL (3.9 mmol/L). Symptoms may include:  Feeling: ? Hungry. ? Sweaty and clammy. ? Irritable or easily upset. ? Dizzy. ? Sleepy.  Having: ? A fast heartbeat. ? A headache. ? A change in your vision. ? Numbness around the mouth, lips, or tongue.  Having trouble with: ? Moving (coordination). ? Sleeping. Treating low blood glucose To treat low blood glucose, eat or drink something containing sugar right away. If you can think clearly and swallow safely, follow the 15:15 rule:  Take 15 grams of a fast-acting carb (carbohydrate), as told by your health care provider.  Some fast-acting carbs are: ? Glucose tablets: take 3-4 tablets. ? Hard candy: eat 3-5 pieces. ? Fruit juice: drink 4 oz (120 mL). ? Regular (not diet) soda: drink 4-6 oz (120-180 mL). ? Honey or sugar: eat 1 Tbsp (15 mL).  Check your blood glucose levels 15 minutes after you take the carb.  If your glucose is still at or below 70 mg/dL (3.9 mmol/L), take 15 grams of a carb again.  If your glucose does not go above 70 mg/dL (3.9 mmol/L) after 3 tries, get help right away.  After your glucose goes back to normal, eat a meal or a snack within 1 hour. Treating very low blood glucose If your glucose is at or below 54 mg/dL (3 mmol/L), you have very low blood glucose (severe hypoglycemia). This is an emergency. Do not wait to see  if the symptoms will go away. Get medical help right away. Call your local emergency services (911 in the [place] Do not drive yourself to the hospital. Questions to ask your health care provider  Should I talk with a diabetes educator?  What equipment will I need to care for myself at home?  What diabetes medicines do I need? When should I take them?  How often do I need to check my blood glucose  levels?  What number can I call if I have questions?  When is my follow-up visit?  Where can I find a support group for people with diabetes? Where to find more information  American Diabetes Association: www.diabetes.org  Association of Diabetes Care and Education Specialists: www.diabeteseducator.org Contact a health care provider if:  Your blood glucose is at or above 240 mg/dL (13.3 mmol/L) for 2 days in a row.  You have been sick or have had a fever for 2 days or more, and you are not getting better.  You have any of these problems for more than 6 hours: ? You cannot eat or drink. ? You feel nauseous. ? You vomit. ? You have diarrhea. Get help right away if:  Your blood glucose is lower than 54 mg/dL (3 mmol/L).  You get confused.  You have trouble thinking clearly.  You have trouble breathing. These symptoms may represent a serious problem that is an emergency. Do not wait to see if the symptoms will go away. Get medical help right away. Call your local emergency services (911 in the [place] Do not drive yourself to the hospital. Summary  Diabetes mellitus is a chronic disease that occurs when the body does not properly use sugar (glucose) that is released from food after you eat.  Take insulin and diabetes medicines as told.  Check your blood glucose every day, as often as told.  Keep all follow-up visits. This is important. This information is not intended to replace advice given to you by your health care provider. Make sure you discuss any questions you have with your health care provider. Document Revised: 10/28/2019 Document Reviewed: 10/28/2019 Elsevier Patient Education  2021 [employer].

## 2020-08-28 LAB — LIPID PANEL W/O CHOL/HDL RATIO
Cholesterol, Total: 188 mg/dL (ref 100–199)
HDL: 49 mg/dL (ref >39)
LDL Chol Calc (NIH): 118 mg/dL — ABNORMAL HIGH (ref 0–99)
Triglycerides: 118 mg/dL (ref 0–149)
VLDL Cholesterol Cal: 21 mg/dL (ref 5–40)

## 2020-08-28 LAB — COMPREHENSIVE METABOLIC PANEL
ALT: 50 IU/L — ABNORMAL HIGH (ref 0–32)
AST: 46 IU/L — ABNORMAL HIGH (ref 0–40)
Albumin/Globulin Ratio: 1.6 (ref 1.2–2.2)
Albumin: 4.4 g/dL (ref 3.8–4.9)
Alkaline Phosphatase: 114 IU/L (ref 44–121)
BUN/Creatinine Ratio: 14 (ref 9–23)
BUN: 11 mg/dL (ref 6–24)
Bilirubin Total: 0.3 mg/dL (ref 0.0–1.2)
CO2: 22 mmol/L (ref 20–29)
Calcium: 9.9 mg/dL (ref 8.7–10.2)
Chloride: 100 mmol/L (ref 96–106)
Creatinine, Ser: 0.81 mg/dL (ref 0.57–1.00)
GFR calc Af Amer: 93 mL/min/{1.73_m2} (ref >59)
GFR calc non Af Amer: 80 mL/min/{1.73_m2} (ref >59)
Globulin, Total: 2.7 g/dL (ref 1.5–4.5)
Glucose: 175 mg/dL — ABNORMAL HIGH (ref 65–99)
Potassium: 4.3 mmol/L (ref 3.5–5.2)
Sodium: 136 mmol/L (ref 134–144)
Total Protein: 7.1 g/dL (ref 6.0–8.5)

## 2020-08-28 LAB — CBC WITH DIFFERENTIAL/PLATELET
Basophils Absolute: 0.1 10*3/uL (ref 0.0–0.2)
Basos: 1 %
EOS (ABSOLUTE): 0.3 10*3/uL (ref 0.0–0.4)
Eos: 4 %
Hematocrit: 43 % (ref 34.0–46.6)
Hemoglobin: 14.3 g/dL (ref 11.1–15.9)
Immature Grans (Abs): 0 10*3/uL (ref 0.0–0.1)
Immature Granulocytes: 0 %
Lymphocytes Absolute: 2.1 10*3/uL (ref 0.7–3.1)
Lymphs: 31 %
MCH: 29.6 pg (ref 26.6–33.0)
MCHC: 33.3 g/dL (ref 31.5–35.7)
MCV: 89 fL (ref 79–97)
Monocytes Absolute: 0.5 10*3/uL (ref 0.1–0.9)
Monocytes: 8 %
Neutrophils Absolute: 4 10*3/uL (ref 1.4–7.0)
Neutrophils: 56 %
Platelets: 261 10*3/uL (ref 150–450)
RBC: 4.83 x10E6/uL (ref 3.77–5.28)
RDW: 12.5 % (ref 11.7–15.4)
WBC: 7 10*3/uL (ref 3.4–10.8)

## 2020-08-28 LAB — TSH: TSH: 0.091 u[IU]/mL — ABNORMAL LOW (ref 0.450–4.500)

## 2020-08-29 ENCOUNTER — Encounter: Payer: Self-pay | Admitting: Family Medicine

## 2020-08-29 MED ORDER — HYDROCODONE-ACETAMINOPHEN 10-325 MG PO TABS: 1.0000 | ORAL_TABLET | Freq: Two times a day (BID) | ORAL | 0 refills | Status: DC | PRN

## 2020-08-29 MED ORDER — HYDROCODONE-ACETAMINOPHEN 10-325 MG PO TABS
1.0000 | ORAL_TABLET | Freq: Two times a day (BID) | ORAL | 0 refills | Status: AC | PRN
Start: 2020-09-26 — End: 2020-10-26

## 2020-08-29 NOTE — Assessment & Plan Note (Signed)
Not under good control with A1c of 7.9. She has been eating a lot of sweets. She will cut down and we'll recheck 3 months. Call with any concerns.

## 2020-08-29 NOTE — Assessment & Plan Note (Signed)
Under good control on current regimen. Continue current regimen. Continue to monitor. Call with any concerns. Refills given for 3 months. Follow up 3 months.    

## 2020-08-29 NOTE — Assessment & Plan Note (Signed)
Continue to follow with neurology. Call with any concerns. Continue to monitor.

## 2020-08-29 NOTE — Assessment & Plan Note (Signed)
Rechecking levels. Continue to monitor. Call with any concerns.

## 2020-08-29 NOTE — Assessment & Plan Note (Signed)
Doing well. No concerns. Continue to monitor.

## 2020-08-29 NOTE — Assessment & Plan Note (Signed)
Seeing endocrine. Just started on methimazole. Rechecking labs today. Await results.

## 2020-08-30 DIAGNOSIS — M79645 Pain in left finger(s): Secondary | ICD-10-CM | POA: Insufficient documentation

## 2020-09-03 ENCOUNTER — Ambulatory Visit
Admission: RE | Admit: 2020-09-03 | Discharge: 2020-09-03 | Disposition: A | Discharge status: Home / Self Care | Payer: Managed Care, Other (non HMO) | Source: Ambulatory Visit | Attending: Family Medicine | Admitting: Family Medicine

## 2020-09-03 DIAGNOSIS — R110 Nausea: Secondary | ICD-10-CM

## 2020-09-03 DIAGNOSIS — R42 Dizziness and giddiness: Secondary | ICD-10-CM

## 2020-09-03 DIAGNOSIS — H539 Unspecified visual disturbance: Secondary | ICD-10-CM

## 2020-09-03 MED ORDER — GADOBENATE DIMEGLUMINE 529 MG/ML IV SOLN
20.0000 mL | Freq: Once | INTRAVENOUS | Status: AC | PRN
  Administered 2020-09-03: 20 mL via INTRAVENOUS

## 2020-09-14 ENCOUNTER — Encounter: Payer: Self-pay | Admitting: Family Medicine

## 2020-09-20 ENCOUNTER — Other Ambulatory Visit: Payer: Self-pay | Admitting: Family Medicine

## 2020-09-20 NOTE — Telephone Encounter (Signed)
Requested medication (s) are due for refill today:   Not sure  Requested medication (s) are on the active medication list:   No  Future visit scheduled:   Yes   Last ordered: Non delegated refill.   Looks to be D/C'd   Requested Prescriptions  Pending Prescriptions Disp Refills   cyclobenzaprine (FLEXERIL) 10 MG tablet [Pharmacy Med Name: CYCLOBENZAPRINE 10MG  TABLETS] 30 tablet 2    Sig: TAKE 1 TABLET(10 MG) BY MOUTH AT BEDTIME      Not Delegated - Analgesics:  Muscle Relaxants Failed - 09/20/2020 10:39 AM      Failed - This refill cannot be delegated      Passed - Valid encounter within last 6 months    Recent Outpatient Visits           3 weeks ago Routine general medical examination at a health care facility   [facility], [place] P, DO   1 month ago Dizziness   [place], Megan P, DO   4 months ago Diabetes mellitus without complication [facility])   [place], Megan P, DO   6 months ago Diarrhea, unspecified type   [facility], Megan P, DO   8 months ago Diabetes mellitus without complication [facility])   [place], Barb Merino, DO       Future Appointments             In 1 month Johnson, Barb Merino, DO [place]

## 2020-09-20 NOTE — Telephone Encounter (Signed)
Patient was last seen in office on 08/27/20 and no upcoming appointment and it looks like it was discontinued by another office last year in November.

## 2020-09-25 ENCOUNTER — Encounter: Payer: Self-pay | Admitting: Family Medicine

## 2020-09-25 DIAGNOSIS — Z1211 Encounter for screening for malignant neoplasm of colon: Secondary | ICD-10-CM

## 2020-10-18 ENCOUNTER — Other Ambulatory Visit: Payer: Self-pay

## 2020-10-18 ENCOUNTER — Telehealth (INDEPENDENT_AMBULATORY_CARE_PROVIDER_SITE_OTHER): Payer: Self-pay | Admitting: Gastroenterology

## 2020-10-18 DIAGNOSIS — Z86010 Personal history of colonic polyps: Secondary | ICD-10-CM

## 2020-10-18 DIAGNOSIS — Z1211 Encounter for screening for malignant neoplasm of colon: Secondary | ICD-10-CM

## 2020-10-18 MED ORDER — NA SULFATE-K SULFATE-MG SULF 17.5-3.13-1.6 GM/177ML PO SOLN: 1.0000 | Freq: Once | ORAL | 0 refills | Status: AC

## 2020-10-18 NOTE — Progress Notes (Signed)
Gastroenterology Pre-Procedure Review  Request Date: 11/05/20 Requesting Physician: Dr. Allen Norris  PATIENT REVIEW QUESTIONS: The patient responded to the following health history questions as indicated:    1. Are you having any GI issues? No 2. Do you have a personal history of Polyps? yes (patient said she is past due for colonoscopy she states she did have colon polyps on the last colonoscopy) 3. Do you have a family history of Colon Cancer or Polyps? no 4. Diabetes Mellitus? no 5. Joint replacements in the past 12 months?No joint replacements however she had Trapeziectomy with ligament repair 06/16/20 performed at [facility] 6. Major health problems in the past 3 months?no 7. Any artificial heart valves, MVP, or defibrillator?no    MEDICATIONS & ALLERGIES:    Patient reports the following regarding taking any anticoagulation/antiplatelet therapy:   Plavix, Coumadin, Eliquis, Xarelto, Lovenox, Pradaxa, Brilinta, or Effient? no Aspirin? no  Patient confirms/reports the following medications:  Current Outpatient Medications  Medication Sig Dispense Refill  . busPIRone (BUSPAR) 7.5 MG tablet Take 1-2 tablets (7.5-15 mg total) by mouth 3 (three) times daily. 180 tablet 2  . gabapentin (NEURONTIN) 600 MG tablet TAKE 1 TABLET(600 MG) BY MOUTH TWICE DAILY 180 tablet 0  . methimazole (TAPAZOLE) 5 MG tablet methimazole 5 mg tablet    . methocarbamol (ROBAXIN) 500 MG tablet TAKE 1 TO 2 TABLETS BY MOUTH FOUR TIMES DAILY FOR MUSCLE SPASMS OR TIGHTNESS    . nortriptyline (PAMELOR) 10 MG capsule Take 20 mg by mouth.    . gabapentin (NEURONTIN) 300 MG capsule Take 2 capsules by mouth 2 (two) times daily as needed.    Marland Kitchen HYDROcodone-acetaminophen (NORCO) 10-325 MG tablet Take 1 tablet by mouth 2 (two) times daily as needed. (Patient not taking: Reported on 10/18/2020) 60 tablet 0   No current facility-administered medications for this visit.    Patient confirms/reports the following allergies:  Allergies   Allergen Reactions  . Crestor [Rosuvastatin Calcium] Other (See Comments)    Indigestion  . Rosuvastatin Calcium Other (See Comments)    Indigestion  . Sulfa Antibiotics Nausea Only  . Zithromax [Azithromycin] Other (See Comments)    Not effective    No orders of the defined types were placed in this encounter.   AUTHORIZATION INFORMATION Primary Insurance: 1D#: Group #:  Secondary Insurance: 1D#: Group #:  SCHEDULE INFORMATION: Date: 11/05/20 Time: Location: [place]

## 2020-10-25 ENCOUNTER — Encounter: Payer: Self-pay | Admitting: Gastroenterology

## 2020-10-28 LAB — HM DIABETES EYE EXAM

## 2020-11-04 NOTE — Discharge Instructions (Signed)

## 2020-11-05 ENCOUNTER — Encounter
Admission: RE | Disposition: A | Discharge status: Home / Self Care | Payer: Self-pay | Source: Home / Self Care | Attending: Gastroenterology

## 2020-11-05 ENCOUNTER — Ambulatory Visit: Payer: Managed Care, Other (non HMO) | Admitting: Anesthesiology

## 2020-11-05 ENCOUNTER — Encounter: Payer: Self-pay | Admitting: Gastroenterology

## 2020-11-05 ENCOUNTER — Ambulatory Visit
Admission: RE | Admit: 2020-11-05 | Discharge: 2020-11-05 | Disposition: A | Discharge status: Home / Self Care | Payer: Managed Care, Other (non HMO) | Attending: Gastroenterology | Admitting: Gastroenterology

## 2020-11-05 ENCOUNTER — Other Ambulatory Visit: Payer: Self-pay

## 2020-11-05 DIAGNOSIS — Z882 Allergy status to sulfonamides status: Secondary | ICD-10-CM | POA: Diagnosis not present

## 2020-11-05 DIAGNOSIS — D124 Benign neoplasm of descending colon: Secondary | ICD-10-CM | POA: Insufficient documentation

## 2020-11-05 DIAGNOSIS — Z1211 Encounter for screening for malignant neoplasm of colon: Secondary | ICD-10-CM | POA: Insufficient documentation

## 2020-11-05 DIAGNOSIS — Z79899 Other long term (current) drug therapy: Secondary | ICD-10-CM | POA: Insufficient documentation

## 2020-11-05 DIAGNOSIS — Z87891 Personal history of nicotine dependence: Secondary | ICD-10-CM | POA: Insufficient documentation

## 2020-11-05 DIAGNOSIS — K635 Polyp of colon: Secondary | ICD-10-CM

## 2020-11-05 DIAGNOSIS — Z860100 Personal history of colon polyps, unspecified: Secondary | ICD-10-CM

## 2020-11-05 DIAGNOSIS — K640 First degree hemorrhoids: Secondary | ICD-10-CM | POA: Diagnosis not present

## 2020-11-05 DIAGNOSIS — Z881 Allergy status to other antibiotic agents status: Secondary | ICD-10-CM | POA: Insufficient documentation

## 2020-11-05 DIAGNOSIS — D122 Benign neoplasm of ascending colon: Secondary | ICD-10-CM | POA: Diagnosis not present

## 2020-11-05 HISTORY — PX: POLYPECTOMY: SHX5525

## 2020-11-05 HISTORY — PX: COLONOSCOPY WITH PROPOFOL: SHX5780

## 2020-11-05 SURGERY — COLONOSCOPY WITH PROPOFOL
Anesthesia: General

## 2020-11-05 MED ORDER — ONDANSETRON HCL 4 MG/2ML IJ SOLN: 4.0000 mg | Freq: Once | INTRAMUSCULAR | Status: DC | PRN

## 2020-11-05 MED ORDER — STERILE WATER FOR IRRIGATION IR SOLN
Status: DC | PRN
  Administered 2020-11-05: 150 mL

## 2020-11-05 MED ORDER — SODIUM CHLORIDE 0.9 % IV SOLN: INTRAVENOUS | Status: DC

## 2020-11-05 MED ORDER — LACTATED RINGERS IV SOLN: INTRAVENOUS | Status: DC

## 2020-11-05 MED ORDER — ACETAMINOPHEN 325 MG PO TABS: 325.0000 mg | ORAL_TABLET | ORAL | Status: DC | PRN

## 2020-11-05 MED ORDER — PROPOFOL 10 MG/ML IV BOLUS
INTRAVENOUS | Status: DC | PRN
  Administered 2020-11-05 (×2): 50 mg via INTRAVENOUS
  Administered 2020-11-05: 100 mg via INTRAVENOUS

## 2020-11-05 MED ORDER — ACETAMINOPHEN 160 MG/5ML PO SOLN: 325.0000 mg | ORAL | Status: DC | PRN

## 2020-11-05 MED ORDER — LIDOCAINE HCL (CARDIAC) PF 100 MG/5ML IV SOSY
PREFILLED_SYRINGE | INTRAVENOUS | Status: DC | PRN
  Administered 2020-11-05: 50 mg via INTRAVENOUS

## 2020-11-05 SURGICAL SUPPLY — 22 items
CLIP HMST 235XBRD CATH ROT (MISCELLANEOUS) IMPLANT
CLIP RESOLUTION 360 11X235 (MISCELLANEOUS)
ELECT REM PT RETURN 9FT ADLT (ELECTROSURGICAL)
ELECTRODE REM PT RTRN 9FT ADLT (ELECTROSURGICAL) IMPLANT
FORCEPS BIOP RAD 4 LRG CAP 4 (CUTTING FORCEPS) IMPLANT
GOWN CVR UNV OPN BCK APRN NK (MISCELLANEOUS) ×4 IMPLANT
GOWN ISOL THUMB LOOP REG UNIV (MISCELLANEOUS) ×6
INJECTOR VARIJECT VIN23 (MISCELLANEOUS) IMPLANT
KIT DEFENDO VALVE AND CONN (KITS) IMPLANT
KIT PRC NS LF DISP ENDO (KITS) ×2 IMPLANT
KIT PROCEDURE OLYMPUS (KITS) ×3
MANIFOLD NEPTUNE II (INSTRUMENTS) ×3 IMPLANT
MARKER SPOT ENDO TATTOO 5ML (MISCELLANEOUS) IMPLANT
PROBE APC STR FIRE (PROBE) IMPLANT
RETRIEVER NET ROTH 2.5X230 LF (MISCELLANEOUS) IMPLANT
SNARE COLD EXACTO (MISCELLANEOUS) ×1 IMPLANT
SNARE SHORT THROW 13M SML OVAL (MISCELLANEOUS) IMPLANT
SNARE SNG USE RND 15MM (INSTRUMENTS) IMPLANT
SPOT EX ENDOSCOPIC TATTOO (MISCELLANEOUS)
TRAP ETRAP POLY (MISCELLANEOUS) ×1 IMPLANT
VARIJECT INJECTOR VIN23 (MISCELLANEOUS)
WATER STERILE IRR 250ML POUR (IV SOLUTION) ×3 IMPLANT

## 2020-11-05 NOTE — H&P (Signed)
Lucilla Lame, MD [address] [place], [place] [address] Phone:[phone] Fax : [phone]  Primary Care Physician:  Valerie Roys, DO Primary Gastroenterologist:  Dr. Allen Norris  Pre-Procedure History & Physical: HPI:  Carol Becker is a 60 y.o. female is here for an colonoscopy.   Past Medical History:  Diagnosis Date  . Cervical spinal stenosis   . GERD (gastroesophageal reflux disease)   . Hyperlipidemia   . Osteopenia   . Sleep apnea   . Thyroid disease     Past Surgical History:  Procedure Laterality Date  . ANTERIOR / POSTERIOR COMBINED FUSION CERVICAL SPINE    . KNEE ARTHROSCOPY WITH EXCISION BAKER'S CYST    . trapeziectomy     done at [facility] 06/16/20  . TUBAL LIGATION      Prior to Admission medications   Medication Sig Start Date End Date Taking? Authorizing Provider  busPIRone (BUSPAR) 7.5 MG tablet Take 1-2 tablets (7.5-15 mg total) by mouth 3 (three) times daily. 08/27/20  Yes Johnson, Megan P, DO  gabapentin (NEURONTIN) 600 MG tablet TAKE 1 TABLET(600 MG) BY MOUTH TWICE DAILY 07/22/20  Yes Johnson, Megan P, DO  methimazole (TAPAZOLE) 5 MG tablet methimazole 5 mg tablet 07/29/20  Yes [provider]  methocarbamol (ROBAXIN) 500 MG tablet TAKE 1 TO 2 TABLETS BY MOUTH FOUR TIMES DAILY FOR MUSCLE SPASMS OR TIGHTNESS 08/26/20  Yes [provider]  nortriptyline (PAMELOR) 10 MG capsule Take 20 mg by mouth. 04/05/20  Yes [provider]    Allergies as of 10/18/2020 - Review Complete 10/18/2020  Allergen Reaction Noted  . Crestor [rosuvastatin calcium] Other (See Comments) 03/19/2015  . Rosuvastatin calcium Other (See Comments) 03/19/2015  . Sulfa antibiotics Nausea Only 03/19/2015  . Zithromax [azithromycin] Other (See Comments) 03/19/2015    Family History  Problem Relation Age of Onset  . Heart disease Father        CAD  . Heart disease Sister 58       CAD  . Cancer Maternal Grandmother        breast  . Breast  cancer Maternal Grandmother 24  . Cancer Paternal Grandmother        breast  . Breast cancer Paternal Grandmother 83  . Bipolar disorder Daughter   . Alzheimer's disease Maternal Grandfather   . Heart disease Cousin   . Parkinson's disease Sister     Social History   Socioeconomic History  . Marital status: Married    Spouse name: Not on file  . Number of children: Not on file  . Years of education: Not on file  . Highest education level: Not on file  Occupational History  . Not on file  Tobacco Use  . Smoking status: Former Smoker    Quit date: 04/06/2011    Years since quitting: 9.5  . Smokeless tobacco: Never Used  Vaping Use  . Vaping Use: Former  Substance and Sexual Activity  . Alcohol use: Yes    Comment: On Occasion  . Drug use: No  . Sexual activity: Yes    Birth control/protection: Post-menopausal  Other Topics Concern  . Not on file  Social History Narrative  . Not on file   Social Determinants of Health   Financial Resource Strain: Not on file  Food Insecurity: Not on file  Transportation Needs: Not on file  Physical Activity: Not on file  Stress: Not on file  Social Connections: Not on file  Intimate Partner Violence: Not  on file    Review of Systems: See HPI, otherwise negative ROS  Physical Exam: BP 137/88   Pulse 80   Temp 97.8 F (36.6 C) (Temporal)   Ht 5' 3.5" (1.613 m)   Wt 93.9 kg   SpO2 98%   BMI 36.09 kg/m  General:   Alert,  pleasant and cooperative in NAD Head:  Normocephalic and atraumatic. Neck:  Supple; no masses or thyromegaly. Lungs:  Clear throughout to auscultation.    Heart:  Regular rate and rhythm. Abdomen:  Soft, nontender and nondistended. Normal bowel sounds, without guarding, and without rebound.   Neurologic:  Alert and  oriented x4;  grossly normal neurologically.  Impression/Plan: Carol Becker is here for an colonoscopy to be performed for a history of adenomatous polyps on 2014   Risks, benefits,  limitations, and alternatives regarding  colonoscopy have been reviewed with the patient.  Questions have been answered.  All parties agreeable.   Lucilla Lame, MD  11/05/2020, 8:45 AM

## 2020-11-05 NOTE — Op Note (Signed)
[facility] Gastroenterology Patient Name: Carol Becker Procedure Date: 11/05/2020 8:47 AM MRN: [id number] Account #: [id number] Date of Birth: 1961/05/13 Admit Type: Outpatient Age: 60 Room: [facility] OR ROOM 01 Gender: Female Note Status: Finalized Procedure:             Colonoscopy Indications:           High risk colon cancer surveillance: Personal history                         of colonic polyps Providers:             Lucilla Lame MD, MD Referring MD:          Valerie Roys (Referring MD) Medicines:             Propofol per Anesthesia Complications:         No immediate complications. Procedure:             Pre-Anesthesia Assessment:                        - Prior to the procedure, a History and Physical was                         performed, and patient medications and allergies were                         reviewed. The patient's tolerance of previous                         anesthesia was also reviewed. The risks and benefits                         of the procedure and the sedation options and risks                         were discussed with the patient. All questions were                         answered, and informed consent was obtained. Prior                         Anticoagulants: The patient has taken no previous                         anticoagulant or antiplatelet agents. ASA Grade                         Assessment: II - A patient with mild systemic disease.                         After reviewing the risks and benefits, the patient                         was deemed in satisfactory condition to undergo the                         procedure.  After obtaining informed consent, the colonoscope was                         passed under direct vision. Throughout the procedure,                         the patient's blood pressure, pulse, and oxygen                         saturations were monitored continuously. The                          Colonoscope was introduced through the anus and                         advanced to the the cecum, identified by appendiceal                         orifice and ileocecal valve. The colonoscopy was                         performed without difficulty. The patient tolerated                         the procedure well. The quality of the bowel                         preparation was good. Findings:      The perianal and digital rectal examinations were normal.      A 5 mm polyp was found in the ascending colon. The polyp was sessile.       The polyp was removed with a cold snare. Resection and retrieval were       complete.      A 4 mm polyp was found in the descending colon. The polyp was sessile.       The polyp was removed with a cold snare. Resection and retrieval were       complete.      Non-bleeding internal hemorrhoids were found during retroflexion. The       hemorrhoids were Grade I (internal hemorrhoids that do not prolapse). Impression:            - One 5 mm polyp in the ascending colon, removed with                         a cold snare. Resected and retrieved.                        - One 4 mm polyp in the descending colon, removed with                         a cold snare. Resected and retrieved.                        - Non-bleeding internal hemorrhoids. Recommendation:        - Discharge patient to home.                        - Resume previous diet.                        -  Continue present medications.                        - Await pathology results.                        - Repeat colonoscopy in 7 years for surveillance if                         adenomatous and 10 years if hyperplastic. Procedure Code(s):     --- Professional ---                        [phone], Colonoscopy, flexible; with removal of                         tumor(s), polyp(s), or other lesion(s) by snare                         technique Diagnosis Code(s):     --- Professional ---                         Z86.010, Personal history of colonic polyps                        K63.5, Polyp of colon CPT copyright 2019 [employer]. All rights reserved. The codes documented in this report are preliminary and upon coder review may  be revised to meet current compliance requirements. Lucilla Lame MD, MD 11/05/2020 9:10:57 AM This report has been signed electronically. Number of Addenda: 0 Note Initiated On: 11/05/2020 8:47 AM Scope Withdrawal Time: 0 hours 8 minutes 55 seconds  Total Procedure Duration: 0 hours 11 minutes 0 seconds  Estimated Blood Loss:  Estimated blood loss: none.      [facility]

## 2020-11-05 NOTE — Transfer of Care (Signed)
Immediate Anesthesia Transfer of Care Note  Patient: Carol Becker  Procedure(s) Performed: COLONOSCOPY WITH PROPOFOL (N/A ) POLYPECTOMY  Patient Location: PACU  Anesthesia Type: General  Level of Consciousness: awake, alert  and patient cooperative  Airway and Oxygen Therapy: Patient Spontanous Breathing and Patient connected to supplemental oxygen  Post-op Assessment: Post-op Vital signs reviewed, Patient's Cardiovascular Status Stable, Respiratory Function Stable, Patent Airway and No signs of Nausea or vomiting  Post-op Vital Signs: Reviewed and stable  Complications: No complications documented.

## 2020-11-05 NOTE — Anesthesia Procedure Notes (Signed)
Procedure Name: MAC Date/Time: 11/05/2020 9:02 AM Performed by: Jeannene Patella, CRNA Pre-anesthesia Checklist: Patient identified, Emergency Drugs available, Suction available, Timeout performed and Patient being monitored Patient Re-evaluated:Patient Re-evaluated prior to induction Oxygen Delivery Method: Nasal cannula Placement Confirmation: positive ETCO2

## 2020-11-05 NOTE — Anesthesia Preprocedure Evaluation (Signed)
Anesthesia Evaluation  Patient identified by MRN, date of birth, ID band Patient awake    Reviewed: Allergy & Precautions, NPO status   Airway Mallampati: II  TM Distance: >3 FB     Dental   Pulmonary sleep apnea , former smoker,    Pulmonary exam normal        Cardiovascular  Rhythm:Regular Rate:Normal     Neuro/Psych PSYCHIATRIC DISORDERS Anxiety Bipolar Disorder    GI/Hepatic GERD  ,  Endo/Other  diabetesHypothyroidism Obesity  Renal/GU      Musculoskeletal  (+) Arthritis , osteopenia   Abdominal   Peds  Hematology   Anesthesia Other Findings   Reproductive/Obstetrics                             Anesthesia Physical Anesthesia Plan  ASA: III  Anesthesia Plan: General   Post-op Pain Management:    Induction: Intravenous  PONV Risk Score and Plan: Propofol infusion, TIVA and Treatment may vary due to age or medical condition  Airway Management Planned: Natural Airway and Nasal Cannula  Additional Equipment:   Intra-op Plan:   Post-operative Plan:   Informed Consent: I have reviewed the patients History and Physical, chart, labs and discussed the procedure including the risks, benefits and alternatives for the proposed anesthesia with the patient or authorized representative who has indicated his/her understanding and acceptance.       Plan Discussed with: CRNA  Anesthesia Plan Comments:         Anesthesia Quick Evaluation

## 2020-11-05 NOTE — Anesthesia Postprocedure Evaluation (Signed)
Anesthesia Post Note  Patient: Carol Becker  Procedure(s) Performed: COLONOSCOPY WITH PROPOFOL (N/A ) POLYPECTOMY     Patient location during evaluation: PACU Anesthesia Type: General Level of consciousness: awake Pain management: pain level controlled Vital Signs Assessment: post-procedure vital signs reviewed and stable Respiratory status: respiratory function stable Cardiovascular status: stable Postop Assessment: no signs of nausea or vomiting Anesthetic complications: no   No complications documented.  Veda Canning

## 2020-11-08 ENCOUNTER — Encounter: Payer: Self-pay | Admitting: Gastroenterology

## 2020-11-08 LAB — SURGICAL PATHOLOGY

## 2020-11-12 ENCOUNTER — Ambulatory Visit: Payer: Managed Care, Other (non HMO) | Admitting: Family Medicine

## 2020-11-12 ENCOUNTER — Encounter: Payer: Self-pay | Admitting: Family Medicine

## 2020-11-12 ENCOUNTER — Other Ambulatory Visit: Payer: Self-pay

## 2020-11-12 VITALS — BP 119/81 | HR 78 | Temp 98.2°F | Wt 209.8 lb

## 2020-11-12 DIAGNOSIS — H43813 Vitreous degeneration, bilateral: Secondary | ICD-10-CM | POA: Insufficient documentation

## 2020-11-12 DIAGNOSIS — E119 Type 2 diabetes mellitus without complications: Secondary | ICD-10-CM | POA: Diagnosis not present

## 2020-11-12 DIAGNOSIS — G43809 Other migraine, not intractable, without status migrainosus: Secondary | ICD-10-CM | POA: Insufficient documentation

## 2020-11-12 DIAGNOSIS — G894 Chronic pain syndrome: Secondary | ICD-10-CM

## 2020-11-12 LAB — BAYER DCA HB A1C WAIVED: HB A1C (BAYER DCA - WAIVED): 6.7 % (ref <7.0)

## 2020-11-12 MED ORDER — GABAPENTIN 600 MG PO TABS: ORAL_TABLET | ORAL | 1 refills | Status: DC

## 2020-11-12 MED ORDER — HYDROCODONE-ACETAMINOPHEN 10-325 MG PO TABS: 1.0000 | ORAL_TABLET | Freq: Two times a day (BID) | ORAL | 0 refills | Status: DC | PRN

## 2020-11-12 MED ORDER — DICLOFENAC SODIUM 1 % EX GEL: 4.0000 g | Freq: Four times a day (QID) | CUTANEOUS | 12 refills | Status: DC

## 2020-11-12 MED ORDER — HYDROCODONE-ACETAMINOPHEN 10-325 MG PO TABS: 1.0000 | ORAL_TABLET | Freq: Two times a day (BID) | ORAL | 0 refills | Status: AC | PRN

## 2020-11-12 NOTE — Assessment & Plan Note (Signed)
Under good control on current regimen. Continue current regimen. Continue to monitor. Call with any concerns. Refills given today. We expect 120 pills to last at least 3-4 months. Follow up 3-4 months. Call with any concerns.

## 2020-11-12 NOTE — Assessment & Plan Note (Signed)
Doing much better with a1c down to 6.7 from 7.9. Continue current regimen. Continue to monitor. Recheck 3 months.

## 2020-11-12 NOTE — Progress Notes (Signed)
BP 119/81   Pulse 78   Temp 98.2 F (36.8 C)   Wt 209 lb 12.8 oz (95.2 kg)   SpO2 99%   BMI 36.58 kg/m    Subjective:    Patient ID: Carol Becker, female    DOB: 01/02/1961, 60 y.o.   MRN: [id number]  HPI: Carol Becker is a 60 y.o. female  Chief Complaint  Patient presents with  . Diabetes  . Shoulder Pain    Patient states she is having shoulder pain since December    DIABETES Hypoglycemic episodes:no Polydipsia/polyuria: no Visual disturbance: no Chest pain: no Paresthesias: no Glucose Monitoring: yes  Accucheck frequency: Daily Taking Insulin?: no Blood Pressure Monitoring: not checking Retinal Examination: Not up to Date Foot Exam: Not up to Date Diabetic Education: Not Completed Pneumovax: Up to Date Influenza: Up to Date  Aspirin: no  Has been having issues with her shoulder. Has been seeing ortho. She had a steroid shot in the shoulder that didn't seem to do any good. She is working with ortho and trying to get the injection approved by the insurance  CHRONIC PAIN- has been taking her pain pill about every other day.   Present dose: 20 Morphine equivalents Pain control status: better Duration: chronic Location: low back and neck Quality: aching and sore Current Pain Level: mild Previous Pain Level: severe Breakthrough pain: no Benefit from narcotic medications: yes What Activities task can be accomplished with current medication? Able to do her ADLs Interested in weaning off narcotics:yes   Stool softners/OTC fiber: yes  Previous pain specialty evaluation: yes Non-narcotic analgesic meds: yes Narcotic contract: yes  Relevant past medical, surgical, family and social history reviewed and updated as indicated. Interim medical history since our last visit reviewed. Allergies and medications reviewed and updated.  Review of Systems  Constitutional: Negative.   Respiratory: Negative.   Cardiovascular: Negative.   Musculoskeletal: Positive  for arthralgias and myalgias. Negative for back pain, gait problem, joint swelling, neck pain and neck stiffness.  Skin: Negative.   Neurological: Negative.   Psychiatric/Behavioral: Negative.     Per HPI unless specifically indicated above     Objective:    BP 119/81   Pulse 78   Temp 98.2 F (36.8 C)   Wt 209 lb 12.8 oz (95.2 kg)   SpO2 99%   BMI 36.58 kg/m   Wt Readings from Last 3 Encounters:  11/12/20 209 lb 12.8 oz (95.2 kg)  11/05/20 207 lb (93.9 kg)  08/27/20 213 lb (96.6 kg)    Physical Exam Vitals and nursing note reviewed.  Constitutional:      General: She is not in acute distress.    Appearance: Normal appearance. She is not ill-appearing, toxic-appearing or diaphoretic.  HENT:     Head: Normocephalic and atraumatic.     Right Ear: External ear normal.     Left Ear: External ear normal.     Nose: Nose normal.     Mouth/Throat:     Mouth: Mucous membranes are moist.     Pharynx: Oropharynx is clear.  Eyes:     General: No scleral icterus.       Right eye: No discharge.        Left eye: No discharge.     Extraocular Movements: Extraocular movements intact.     Conjunctiva/sclera: Conjunctivae normal.     Pupils: Pupils are equal, round, and reactive to light.  Cardiovascular:     Rate and Rhythm: Normal rate and  regular rhythm.     Pulses: Normal pulses.     Heart sounds: Normal heart sounds. No murmur heard. No friction rub. No gallop.   Pulmonary:     Effort: Pulmonary effort is normal. No respiratory distress.     Breath sounds: Normal breath sounds. No stridor. No wheezing, rhonchi or rales.  Chest:     Chest wall: No tenderness.  Musculoskeletal:        General: Normal range of motion.     Cervical back: Normal range of motion and neck supple.  Skin:    General: Skin is warm and dry.     Capillary Refill: Capillary refill takes less than 2 seconds.     Coloration: Skin is not jaundiced or pale.     Findings: No bruising, erythema, lesion or  rash.  Neurological:     General: No focal deficit present.     Mental Status: She is alert and oriented to person, place, and time. Mental status is at baseline.  Psychiatric:        Mood and Affect: Mood normal.        Behavior: Behavior normal.        Thought Content: Thought content normal.        Judgment: Judgment normal.     Results for orders placed or performed in visit on 11/12/20  Bayer DCA Hb A1c Waived  Result Value Ref Range   HB A1C (BAYER DCA - WAIVED) 6.7 <7.0 %      Assessment & Plan:   Problem List Items Addressed This Visit      Endocrine   Diabetes mellitus without complication ([place]) - Primary    Doing much better with a1c down to 6.7 from 7.9. Continue current regimen. Continue to monitor. Recheck 3 months.       Relevant Orders   Bayer DCA Hb A1c Waived (Completed)     Other   Chronic pain    Under good control on current regimen. Continue current regimen. Continue to monitor. Call with any concerns. Refills given today. We expect 120 pills to last at least 3-4 months. Follow up 3-4 months. Call with any concerns.         Relevant Medications   gabapentin (NEURONTIN) 600 MG tablet   HYDROcodone-acetaminophen (NORCO) 10-325 MG tablet   HYDROcodone-acetaminophen (NORCO) 10-325 MG tablet (Start on 12/12/2020)       Follow up plan: Return in about 3 months (around 02/12/2021).

## 2020-12-15 ENCOUNTER — Encounter: Payer: Self-pay | Admitting: Family Medicine

## 2020-12-20 ENCOUNTER — Ambulatory Visit: Payer: Managed Care, Other (non HMO) | Admitting: Nurse Practitioner

## 2020-12-23 ENCOUNTER — Telehealth (INDEPENDENT_AMBULATORY_CARE_PROVIDER_SITE_OTHER): Payer: Managed Care, Other (non HMO) | Admitting: Family Medicine

## 2020-12-23 ENCOUNTER — Encounter: Payer: Self-pay | Admitting: Family Medicine

## 2020-12-23 ENCOUNTER — Other Ambulatory Visit: Payer: Self-pay

## 2020-12-23 DIAGNOSIS — F3181 Bipolar II disorder: Secondary | ICD-10-CM | POA: Diagnosis not present

## 2020-12-23 MED ORDER — ARIPIPRAZOLE 5 MG PO TABS: 5.0000 mg | ORAL_TABLET | Freq: Every day | ORAL | 3 refills | Status: DC

## 2020-12-23 NOTE — Progress Notes (Signed)
BP (!) 153/96   Temp (!) 97.3 F (36.3 C)   Wt 207 lb (93.9 kg)   BMI 36.09 kg/m    Subjective:    Patient ID: Carol Becker, female    DOB: January 19, 1961, 60 y.o.   MRN: [id number]  HPI: [name] is a 60 y.o. female  Chief Complaint  Patient presents with   Anxiety   ANXIETY/STRESS Duration: couple of weeks Status: exacerbated Anxious mood: yes  Excessive worrying: yes Irritability: yes  Sweating: no Nausea: no Palpitations:no Hyperventilation: no Panic attacks: no Agoraphobia: no  Obscessions/compulsions: yes Depressed mood: yes Depression screen [facility] 2/9 12/23/2020 05/14/2020 01/08/2020 09/30/2019 06/29/2019  Decreased Interest 2 0 1 0 2  Down, Depressed, Hopeless 1 0 0 0 0  PHQ - 2 Score 3 0 1 0 2  Altered sleeping 3 0 0 0 2  Tired, decreased energy 0 0 1 2 2   Change in appetite 0 0 1 0 0  Feeling bad or failure about yourself  0 0 0 0 0  Trouble concentrating 3 0 0 0 1  Moving slowly or fidgety/restless 0 0 0 0 0  Suicidal thoughts 0 0 0 0 0  PHQ-9 Score 9 0 3 2 7   Difficult doing work/chores Somewhat difficult Not difficult at all Somewhat difficult Not difficult at all Somewhat difficult  Some recent data might be hidden   Anhedonia: no Weight changes: no Insomnia: yes hard to fall asleep  Hypersomnia: no Fatigue/loss of energy: no Feelings of worthlessness: yes Feelings of guilt: yes Impaired concentration/indecisiveness: yes Suicidal ideations: no  Crying spells: no Recent Stressors/Life Changes: yes   Relationship problems: no   Family stress: yes     Financial stress: no    Job stress: no    Recent death/loss: no   Relevant past medical, surgical, family and social history reviewed and updated as indicated. Interim medical history since our last visit reviewed. Allergies and medications reviewed and updated.  Review of Systems  Constitutional: Negative.   Respiratory: Negative.    Cardiovascular: Negative.   Gastrointestinal:  Negative.   Musculoskeletal: Negative.   Psychiatric/Behavioral:  Positive for agitation, decreased concentration, dysphoric mood and sleep disturbance. Negative for behavioral problems, confusion, hallucinations, self-injury and suicidal ideas. The patient is nervous/anxious. The patient is not hyperactive.    Per HPI unless specifically indicated above     Objective:    BP (!) 153/96   Temp (!) 97.3 F (36.3 C)   Wt 207 lb (93.9 kg)   BMI 36.09 kg/m   Wt Readings from Last 3 Encounters:  12/23/20 207 lb (93.9 kg)  11/12/20 209 lb 12.8 oz (95.2 kg)  11/05/20 207 lb (93.9 kg)    Physical Exam Vitals and nursing note reviewed.  Constitutional:      General: She is not in acute distress.    Appearance: Normal appearance. She is not ill-appearing, toxic-appearing or diaphoretic.  HENT:     Head: Normocephalic and atraumatic.     Right Ear: External ear normal.     Left Ear: External ear normal.     Nose: Nose normal.     Mouth/Throat:     Mouth: Mucous membranes are moist.     Pharynx: Oropharynx is clear.  Eyes:     General: No scleral icterus.       Right eye: No discharge.        Left eye: No discharge.     Conjunctiva/sclera: Conjunctivae normal.  Pupils: Pupils are equal, round, and reactive to light.  Pulmonary:     Effort: Pulmonary effort is normal. No respiratory distress.     Comments: Speaking in full sentences Musculoskeletal:        General: Normal range of motion.     Cervical back: Normal range of motion.  Skin:    Coloration: Skin is not jaundiced or pale.     Findings: No bruising, erythema, lesion or rash.  Neurological:     Mental Status: She is alert and oriented to person, place, and time. Mental status is at baseline.  Psychiatric:        Mood and Affect: Mood normal.        Behavior: Behavior normal.        Thought Content: Thought content normal.        Judgment: Judgment normal.    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 Will start her on abilify. Recheck in about 6 weeks.          Follow up plan: Return if symptoms worsen or fail to improve.   This visit was completed via video visit through MyChart due to the restrictions of the COVID-19 pandemic. All issues as above were discussed and addressed. Physical exam was done as above through visual confirmation on video through MyChart. If it was felt that the patient should be evaluated in the office, they were directed there. The patient verbally consented to this visit. Location of the patient: home Location of the provider: work Those involved with this call:  Provider: Park Liter, DO CMA: Louanna Raw, [place] Desk/Registration: Jill Side  Time spent on call:  15 minutes with patient face to face via video conference. More than 50% of this time was spent in counseling and coordination of care. 23 minutes total spent in review of patient's record and preparation of their chart.

## 2020-12-23 NOTE — Assessment & Plan Note (Signed)
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 Will start her on abilify. Recheck in about 6 weeks.

## 2020-12-27 ENCOUNTER — Encounter: Payer: Self-pay | Admitting: Family Medicine

## 2021-01-12 ENCOUNTER — Other Ambulatory Visit: Payer: Self-pay | Admitting: Family Medicine

## 2021-02-11 ENCOUNTER — Other Ambulatory Visit: Payer: Self-pay

## 2021-02-11 ENCOUNTER — Other Ambulatory Visit: Payer: Self-pay | Admitting: Family Medicine

## 2021-02-11 ENCOUNTER — Ambulatory Visit (INDEPENDENT_AMBULATORY_CARE_PROVIDER_SITE_OTHER): Payer: Managed Care, Other (non HMO) | Admitting: Family Medicine

## 2021-02-11 ENCOUNTER — Encounter: Payer: Self-pay | Admitting: Family Medicine

## 2021-02-11 VITALS — BP 122/78 | HR 88 | Temp 98.6°F | Ht 63.5 in | Wt 210.8 lb

## 2021-02-11 DIAGNOSIS — M25512 Pain in left shoulder: Secondary | ICD-10-CM

## 2021-02-11 DIAGNOSIS — G8929 Other chronic pain: Secondary | ICD-10-CM

## 2021-02-11 DIAGNOSIS — E782 Mixed hyperlipidemia: Secondary | ICD-10-CM

## 2021-02-11 DIAGNOSIS — E0590 Thyrotoxicosis, unspecified without thyrotoxic crisis or storm: Secondary | ICD-10-CM

## 2021-02-11 DIAGNOSIS — E119 Type 2 diabetes mellitus without complications: Secondary | ICD-10-CM | POA: Diagnosis not present

## 2021-02-11 DIAGNOSIS — G894 Chronic pain syndrome: Secondary | ICD-10-CM

## 2021-02-11 DIAGNOSIS — E039 Hypothyroidism, unspecified: Secondary | ICD-10-CM

## 2021-02-11 DIAGNOSIS — F3181 Bipolar II disorder: Secondary | ICD-10-CM

## 2021-02-11 LAB — BAYER DCA HB A1C WAIVED: HB A1C (BAYER DCA - WAIVED): 6.3 %

## 2021-02-11 MED ORDER — PEN NEEDLES 30G X 5 MM MISC: 1.0000 | 12 refills | Status: DC

## 2021-02-11 MED ORDER — OZEMPIC (0.25 OR 0.5 MG/DOSE) 2 MG/1.5ML ~~LOC~~ SOPN: PEN_INJECTOR | SUBCUTANEOUS | 1 refills | Status: DC

## 2021-02-11 MED ORDER — BUSPIRONE HCL 15 MG PO TABS: ORAL_TABLET | ORAL | 1 refills | Status: DC

## 2021-02-11 MED ORDER — HYDROCODONE-ACETAMINOPHEN 10-325 MG PO TABS: 1.0000 | ORAL_TABLET | Freq: Two times a day (BID) | ORAL | 0 refills | Status: DC | PRN

## 2021-02-11 MED ORDER — MELOXICAM 15 MG PO TABS: 15.0000 mg | ORAL_TABLET | Freq: Every day | ORAL | 1 refills | Status: DC

## 2021-02-11 NOTE — Progress Notes (Signed)
BP 122/78   Pulse 88   Temp 98.6 F (37 C) (Oral)   Ht 5' 3.5" (1.613 m)   Wt 210 lb 12.8 oz (95.6 kg)   SpO2 99%   BMI 36.75 kg/m    Subjective:    Patient ID: Carol Becker, female    DOB: 03-01-1961, 60 y.o.   MRN: [id number]  HPI: Carol Becker is a 60 y.o. female  Chief Complaint  Patient presents with   Anxiety   Diabetes   CHRONIC PAIN  Present dose: 20 Morphine equivalents Pain control status: controlled Duration: chronic Location: low back and leg Quality: aching and sore Current Pain Level: moderate Previous Pain Level: moderate Breakthrough pain: no Benefit from narcotic medications: yes What Activities task can be accomplished with current medication?Able to do ADLs Interested in weaning off narcotics:yes   Stool softners/OTC fiber: no  Previous pain specialty evaluation: no Non-narcotic analgesic meds: yes Narcotic contract: yes  DIABETES Hypoglycemic episodes:no Polydipsia/polyuria: no Visual disturbance: no Chest pain: no Paresthesias: no Glucose Monitoring: no Taking Insulin?: no Blood Pressure Monitoring: none Retinal Examination: Up to Date Foot Exam: Up to Date Diabetic Education: Completed Pneumovax: Up to Date Influenza: Up to Date Aspirin: yes  HYPERLIPIDEMIA Hyperlipidemia status: stable Satisfied with current treatment?  yes Side effects:  not on anything The 10-year ASCVD risk score Mikey Bussing DC Jr., et al., 2013) is: 5.1%   Values used to calculate the score:     Age: 82 years     Sex: Female     Is Non-Hispanic African American: No     Diabetic: Yes     Tobacco smoker: No     Systolic Blood Pressure: 932 mmHg     Is BP treated: No     HDL Cholesterol: 53 mg/dL     Total Cholesterol: 212 mg/dL Chest pain:  yes Coronary artery disease:  no  HYPERTHYROIDISM Thyroid control status: stable Satisfied with current treatment? yes Medication side effects: no Medication compliance: excellent compliance Recent dose  adjustment:yes Fatigue: no Cold intolerance: no Heat intolerance: no Weight gain: yes Weight loss: no Constipation: no Diarrhea/loose stools: no Palpitations: no Lower extremity edema: no Anxiety/depressed mood: yes  BIPOLAR Mood status: stable Satisfied with current treatment?: yes Symptom severity: moderate  Duration of current treatment : chronic Side effects: no Medication compliance: excellent compliance Psychotherapy/counseling: no  Previous psychiatric medications: yes Depressed mood: no Anxious mood: yes Anhedonia: no Significant weight loss or gain: no Insomnia: yes hard to fall asleep Fatigue: yes Feelings of worthlessness or guilt: no Impaired concentration/indecisiveness: no Suicidal ideations: no Hopelessness: no Crying spells: no Depression screen [facility] 2/9 02/11/2021 12/23/2020 05/14/2020 01/08/2020 09/30/2019  Decreased Interest 1 2 0 1 0  Down, Depressed, Hopeless 1 1 0 0 0  PHQ - 2 Score 2 3 0 1 0  Altered sleeping 3 3 0 0 0  Tired, decreased energy 1 0 0 1 2  Change in appetite 0 0 0 1 0  Feeling bad or failure about yourself  0 0 0 0 0  Trouble concentrating 0 3 0 0 0  Moving slowly or fidgety/restless 0 0 0 0 0  Suicidal thoughts 0 0 0 0 0  PHQ-9 Score 6 9 0 3 2  Difficult doing work/chores Not difficult at all Somewhat difficult Not difficult at all Somewhat difficult Not difficult at all  Some recent data might be hidden   OBESTIY Duration: chronic Previous attempts at weight loss: yes Complications of obesity:  HLD, DM Peak weight: 210 (current) Weight loss goal: to be healthy Weight loss to date: none Requesting obesity pharmacotherapy: yes Current weight loss supplements/medications: no Previous weight loss supplements/meds: no   Relevant past medical, surgical, family and social history reviewed and updated as indicated. Interim medical history since our last visit reviewed. Allergies and medications reviewed and updated.  Review of  Systems  Constitutional: Negative.   Respiratory: Negative.    Cardiovascular: Negative.   Gastrointestinal: Negative.   Musculoskeletal:  Positive for arthralgias and myalgias. Negative for back pain, gait problem, joint swelling, neck pain and neck stiffness.  Skin: Negative.   Neurological: Negative.   Psychiatric/Behavioral:  Negative for agitation, behavioral problems, confusion, decreased concentration, dysphoric mood, hallucinations, self-injury, sleep disturbance and suicidal ideas. The patient is nervous/anxious. The patient is not hyperactive.    Per HPI unless specifically indicated above     Objective:    BP 122/78   Pulse 88   Temp 98.6 F (37 C) (Oral)   Ht 5' 3.5" (1.613 m)   Wt 210 lb 12.8 oz (95.6 kg)   SpO2 99%   BMI 36.75 kg/m   Wt Readings from Last 3 Encounters:  02/11/21 210 lb 12.8 oz (95.6 kg)  12/23/20 207 lb (93.9 kg)  11/12/20 209 lb 12.8 oz (95.2 kg)    Physical Exam Vitals and nursing note reviewed.  Constitutional:      General: She is not in acute distress.    Appearance: Normal appearance. She is not ill-appearing, toxic-appearing or diaphoretic.  HENT:     Head: Normocephalic and atraumatic.     Right Ear: External ear normal.     Left Ear: External ear normal.     Nose: Nose normal.     Mouth/Throat:     Mouth: Mucous membranes are moist.     Pharynx: Oropharynx is clear.  Eyes:     General: No scleral icterus.       Right eye: No discharge.        Left eye: No discharge.     Extraocular Movements: Extraocular movements intact.     Conjunctiva/sclera: Conjunctivae normal.     Pupils: Pupils are equal, round, and reactive to light.  Cardiovascular:     Rate and Rhythm: Normal rate and regular rhythm.     Pulses: Normal pulses.     Heart sounds: Normal heart sounds. No murmur heard.   No friction rub. No gallop.  Pulmonary:     Effort: Pulmonary effort is normal. No respiratory distress.     Breath sounds: Normal breath sounds.  No stridor. No wheezing, rhonchi or rales.  Chest:     Chest wall: No tenderness.  Musculoskeletal:        General: Tenderness present.     Cervical back: Normal range of motion and neck supple.  Skin:    General: Skin is warm and dry.     Capillary Refill: Capillary refill takes less than 2 seconds.     Coloration: Skin is not jaundiced or pale.     Findings: No bruising, erythema, lesion or rash.  Neurological:     General: No focal deficit present.     Mental Status: She is alert and oriented to person, place, and time. Mental status is at baseline.  Psychiatric:        Mood and Affect: Mood normal.        Behavior: Behavior normal.        Thought Content: Thought content normal.  Judgment: Judgment normal.    Results for orders placed or performed in visit on 02/11/21  Comprehensive metabolic panel  Result Value Ref Range   Glucose 114 (H) 65 - 99 mg/dL   BUN 13 6 - 24 mg/dL   Creatinine, Ser 0.88 0.57 - 1.00 mg/dL   eGFR 76 >59 mL/min/1.73   BUN/Creatinine Ratio 15 9 - 23   Sodium 141 134 - 144 mmol/L   Potassium 4.3 3.5 - 5.2 mmol/L   Chloride 102 96 - 106 mmol/L   CO2 24 20 - 29 mmol/L   Calcium 9.6 8.7 - 10.2 mg/dL   Total Protein 7.1 6.0 - 8.5 g/dL   Albumin 4.3 3.8 - 4.9 g/dL   Globulin, Total 2.8 1.5 - 4.5 g/dL   Albumin/Globulin Ratio 1.5 1.2 - 2.2   Bilirubin Total 0.2 0.0 - 1.2 mg/dL   Alkaline Phosphatase 99 44 - 121 IU/L   AST 16 0 - 40 IU/L   ALT 29 0 - 32 IU/L  Bayer DCA Hb A1c Waived  Result Value Ref Range   HB A1C (BAYER DCA - WAIVED) 6.3 <7.0 %  Lipid Panel w/o Chol/HDL Ratio  Result Value Ref Range   Cholesterol, Total 212 (H) 100 - 199 mg/dL   Triglycerides 93 0 - 149 mg/dL   HDL 53 >39 mg/dL   VLDL Cholesterol Cal 17 5 - 40 mg/dL   LDL Chol Calc (NIH) 142 (H) 0 - 99 mg/dL  TSH  Result Value Ref Range   TSH 0.313 (L) 0.450 - 4.500 uIU/mL  T4, free  Result Value Ref Range   Free T4 1.13 0.82 - 1.77 ng/dL  T3, free  Result Value  Ref Range   T3, Free 3.3 2.0 - 4.4 pg/mL      Assessment & Plan:   Problem List Items Addressed This Visit       Endocrine   Hyperthyroidism    Following with endocrinology. Doing well. Checking labs today. Await results.        Relevant Orders   Comprehensive metabolic panel (Completed)   TSH (Completed)   T4, free   T3, free   Diabetes mellitus without complication ([place]) - Primary    Doing well with a1c of 6.3. We will start her on ozempic for diabetes and weight loss. Recheck 3 months. Call with any concerns.        Relevant Medications   Semaglutide,0.25 or 0.5MG/DOS, (OZEMPIC, 0.25 OR 0.5 MG/DOSE,) 2 MG/1.5ML SOPN   Other Relevant Orders   Comprehensive metabolic panel (Completed)   Bayer DCA Hb A1c Waived (Completed)     Other   Hyperlipidemia    Rechecking labs. Await results. Treat as needed.        Relevant Orders   Comprehensive metabolic panel (Completed)   Lipid Panel w/o Chol/HDL Ratio (Completed)   Bipolar 2 disorder (H[facility])    Will increase her buspar to 74m. Discussed getting back into psychiatry- she would like to hold for now. Call with any concerns.        Relevant Orders   Comprehensive metabolic panel (Completed)   Morbid obesity ([facility]    Due to DM and HLD. We will start ozempic for sugars and weight. Call with any concerns. Continue to monitor.        Relevant Medications   Semaglutide,0.25 or 0.5MG/DOS, (OZEMPIC, 0.25 OR 0.5 MG/DOSE,) 2 MG/1.5ML SOPN   Chronic pain    Under good control on current regimen. Continue current regimen. Continue  to monitor. Call with any concerns. Refills given for 3 months. Follow up 3 months. Continue to follow with ortho as needed.         Relevant Medications   meloxicam (MOBIC) 15 MG tablet   HYDROcodone-acetaminophen (NORCO) 10-325 MG tablet   Other Visit Diagnoses     Chronic left shoulder pain       Will get her meloxicam. Follow up with ortho as needed. continue to monitor.    Relevant  Medications   meloxicam (MOBIC) 15 MG tablet   HYDROcodone-acetaminophen (NORCO) 10-325 MG tablet        Follow up plan: Return in about 3 months (around 05/14/2021).

## 2021-02-12 LAB — COMPREHENSIVE METABOLIC PANEL
ALT: 29 IU/L (ref 0–32)
AST: 16 IU/L (ref 0–40)
Albumin/Globulin Ratio: 1.5 (ref 1.2–2.2)
Albumin: 4.3 g/dL (ref 3.8–4.9)
Alkaline Phosphatase: 99 IU/L (ref 44–121)
BUN/Creatinine Ratio: 15 (ref 9–23)
BUN: 13 mg/dL (ref 6–24)
Bilirubin Total: 0.2 mg/dL (ref 0.0–1.2)
CO2: 24 mmol/L (ref 20–29)
Calcium: 9.6 mg/dL (ref 8.7–10.2)
Chloride: 102 mmol/L (ref 96–106)
Creatinine, Ser: 0.88 mg/dL (ref 0.57–1.00)
Globulin, Total: 2.8 g/dL (ref 1.5–4.5)
Glucose: 114 mg/dL — ABNORMAL HIGH (ref 65–99)
Potassium: 4.3 mmol/L (ref 3.5–5.2)
Sodium: 141 mmol/L (ref 134–144)
Total Protein: 7.1 g/dL (ref 6.0–8.5)
eGFR: 76 mL/min/{1.73_m2} (ref >59)

## 2021-02-12 LAB — LIPID PANEL W/O CHOL/HDL RATIO
Cholesterol, Total: 212 mg/dL — ABNORMAL HIGH (ref 100–199)
HDL: 53 mg/dL (ref >39)
LDL Chol Calc (NIH): 142 mg/dL — ABNORMAL HIGH (ref 0–99)
Triglycerides: 93 mg/dL (ref 0–149)
VLDL Cholesterol Cal: 17 mg/dL (ref 5–40)

## 2021-02-12 LAB — TSH: TSH: 0.313 u[IU]/mL — ABNORMAL LOW (ref 0.450–4.500)

## 2021-02-12 LAB — T4, FREE: Free T4: 1.13 ng/dL (ref 0.82–1.77)

## 2021-02-12 LAB — T3, FREE: T3, Free: 3.3 pg/mL (ref 2.0–4.4)

## 2021-02-12 NOTE — Assessment & Plan Note (Signed)
Due to DM and HLD. We will start ozempic for sugars and weight. Call with any concerns. Continue to monitor.

## 2021-02-12 NOTE — Assessment & Plan Note (Signed)
Under good control on current regimen. Continue current regimen. Continue to monitor. Call with any concerns. Refills given for 3 months. Follow up 3 months. Continue to follow with ortho as needed.

## 2021-02-12 NOTE — Assessment & Plan Note (Signed)
Following with endocrinology. Doing well. Checking labs today. Await results.

## 2021-02-12 NOTE — Assessment & Plan Note (Addendum)
Will increase her buspar to 15mg . Discussed getting back into psychiatry- she would like to hold for now. Call with any concerns.

## 2021-02-12 NOTE — Assessment & Plan Note (Signed)
Rechecking labs. Await results. Treat as needed.  

## 2021-02-12 NOTE — Assessment & Plan Note (Signed)
Doing well with a1c of 6.3. We will start her on ozempic for diabetes and weight loss. Recheck 3 months. Call with any concerns.

## 2021-02-14 ENCOUNTER — Encounter: Payer: Self-pay | Admitting: Family Medicine

## 2021-02-14 MED ORDER — VICTOZA 18 MG/3ML ~~LOC~~ SOPN: PEN_INJECTOR | SUBCUTANEOUS | 0 refills | Status: DC

## 2021-02-14 MED ORDER — VICTOZA 18 MG/3ML ~~LOC~~ SOPN: 1.8000 mg | PEN_INJECTOR | Freq: Every day | SUBCUTANEOUS | 1 refills | Status: DC

## 2021-02-14 NOTE — Telephone Encounter (Signed)
Per pharmacy there is a shortage of all strengths.

## 2021-02-21 ENCOUNTER — Telehealth: Payer: Self-pay

## 2021-02-21 NOTE — Telephone Encounter (Signed)
PA for Victoza submitted and approved via Cover My Meds.

## 2021-02-28 ENCOUNTER — Ambulatory Visit
Admission: RE | Admit: 2021-02-28 | Discharge: 2021-02-28 | Disposition: A | Discharge status: Home / Self Care | Payer: Managed Care, Other (non HMO) | Source: Ambulatory Visit | Attending: Family Medicine | Admitting: Family Medicine

## 2021-02-28 ENCOUNTER — Other Ambulatory Visit: Payer: Self-pay

## 2021-02-28 DIAGNOSIS — Z1231 Encounter for screening mammogram for malignant neoplasm of breast: Secondary | ICD-10-CM | POA: Diagnosis present

## 2021-03-03 ENCOUNTER — Encounter: Payer: Self-pay | Admitting: Family Medicine

## 2021-03-03 ENCOUNTER — Other Ambulatory Visit: Payer: Self-pay | Admitting: Family Medicine

## 2021-03-03 DIAGNOSIS — N6489 Other specified disorders of breast: Secondary | ICD-10-CM

## 2021-03-03 DIAGNOSIS — R928 Other abnormal and inconclusive findings on diagnostic imaging of breast: Secondary | ICD-10-CM

## 2021-03-03 DIAGNOSIS — R921 Mammographic calcification found on diagnostic imaging of breast: Secondary | ICD-10-CM

## 2021-03-04 DIAGNOSIS — M7502 Adhesive capsulitis of left shoulder: Secondary | ICD-10-CM | POA: Insufficient documentation

## 2021-03-08 ENCOUNTER — Ambulatory Visit
Admission: RE | Admit: 2021-03-08 | Discharge: 2021-03-08 | Disposition: A | Discharge status: Home / Self Care | Payer: Managed Care, Other (non HMO) | Source: Ambulatory Visit | Attending: Family Medicine | Admitting: Family Medicine

## 2021-03-08 ENCOUNTER — Other Ambulatory Visit: Payer: Self-pay

## 2021-03-08 ENCOUNTER — Other Ambulatory Visit: Payer: Self-pay | Admitting: Family Medicine

## 2021-03-08 DIAGNOSIS — R921 Mammographic calcification found on diagnostic imaging of breast: Secondary | ICD-10-CM | POA: Insufficient documentation

## 2021-03-08 DIAGNOSIS — R928 Other abnormal and inconclusive findings on diagnostic imaging of breast: Secondary | ICD-10-CM

## 2021-03-08 DIAGNOSIS — N6489 Other specified disorders of breast: Secondary | ICD-10-CM | POA: Insufficient documentation

## 2021-03-15 ENCOUNTER — Other Ambulatory Visit: Payer: Self-pay

## 2021-03-15 ENCOUNTER — Ambulatory Visit
Admission: RE | Admit: 2021-03-15 | Discharge: 2021-03-15 | Disposition: A | Discharge status: Home / Self Care | Payer: Managed Care, Other (non HMO) | Source: Ambulatory Visit | Attending: Family Medicine | Admitting: Family Medicine

## 2021-03-15 DIAGNOSIS — R921 Mammographic calcification found on diagnostic imaging of breast: Secondary | ICD-10-CM

## 2021-03-15 DIAGNOSIS — R928 Other abnormal and inconclusive findings on diagnostic imaging of breast: Secondary | ICD-10-CM | POA: Insufficient documentation

## 2021-03-15 HISTORY — PX: BREAST BIOPSY: SHX20

## 2021-03-17 ENCOUNTER — Encounter: Payer: Self-pay | Admitting: *Deleted

## 2021-03-17 DIAGNOSIS — D0512 Intraductal carcinoma in situ of left breast: Secondary | ICD-10-CM

## 2021-03-17 LAB — SURGICAL PATHOLOGY

## 2021-03-17 NOTE — Progress Notes (Signed)
Received message from Electa Sniff, RN that patient had been informed of her new diagnosis of breast cancer and is ready for navigation to call her.   Spoke to patient today.  She would like to keep her care with in the [place].  I have scheduled her to see Dr. Christian Mate on 03/22/21 @ 11:30 and Dr. Rogue Bussing on 03/23/21 @ 2:00 for med/onc consult.  Will give  patient breast cancer educational literature, "My Breast Cancer Treatment Handbook" by Josephine Igo, RN atMD appointment.

## 2021-03-18 ENCOUNTER — Telehealth: Payer: Self-pay | Admitting: Family Medicine

## 2021-03-18 NOTE — Telephone Encounter (Signed)
Pt is calling stating that she missed a call from Dr. Wynetta Emery at 5:06pm. Please advise [phone]

## 2021-03-21 ENCOUNTER — Encounter: Payer: Self-pay | Admitting: Family Medicine

## 2021-03-22 ENCOUNTER — Ambulatory Visit: Payer: Managed Care, Other (non HMO) | Admitting: Surgery

## 2021-03-22 ENCOUNTER — Encounter: Payer: Self-pay | Admitting: Surgery

## 2021-03-22 ENCOUNTER — Ambulatory Visit: Payer: Self-pay | Admitting: Surgery

## 2021-03-22 ENCOUNTER — Other Ambulatory Visit: Payer: Self-pay | Admitting: Surgery

## 2021-03-22 ENCOUNTER — Other Ambulatory Visit: Payer: Self-pay

## 2021-03-22 VITALS — BP 153/86 | HR 97 | Temp 99.0°F | Ht 64.0 in | Wt 211.6 lb

## 2021-03-22 DIAGNOSIS — D0512 Intraductal carcinoma in situ of left breast: Secondary | ICD-10-CM

## 2021-03-22 NOTE — Patient Instructions (Addendum)
Our surgery scheduler will call you within 24-48 hours to schedule your surgery. Please have the [place] surgery sheet available when speaking with her.   Lumpectomy A lumpectomy, sometimes called a partial mastectomy, is surgery to remove a cancerous tumor or mass (the lump) from a breast. It is a form of breast-conserving or breast-preservation surgery. This means that the cancerous tissue is removed but the breast remains intact. During a lumpectomy, the portion of the breast that contains the tumor is removed. Some normal tissue around the lump may be taken out to make sure that all of the tumor has been removed. Lymph nodes under your arm may also be removed and tested to find out if the cancer has spread. Lymph nodes are part of the body's disease-fighting system (immune system) and are usually the first place where breast cancer spreads. Tell a health care provider about: Any allergies you have. All medicines you are taking, including vitamins, herbs, eye drops, creams, and over-the-counter medicines. Any problems you or family members have had with anesthetic medicines. Any blood disorders you have. Any surgeries you have had. Any medical conditions you have. Whether you are pregnant or may be pregnant. What are the risks? Generally, this is a safe procedure. However, problems may occur, including: Bleeding. Infection. Allergic reaction to medicines. Pain, swelling, weakness, or numbness in the arm on the side of your surgery. Temporary swelling. Change in the shape of the breast, particularly if a large portion is removed. Scar tissue that forms at the surgical site and feels hard to the touch. Blood clots. What happens before the procedure? Staying hydrated Follow instructions from your health care provider about hydration, which may include: Up to 2 hours before the procedure - you may continue to drink clear liquids, such as water, clear fruit juice, black coffee, and plain  tea.  Eating and drinking restrictions Follow instructions from your health care provider about eating and drinking, which may include: 8 hours before the procedure - stop eating heavy meals or foods, such as meat, fried foods, or fatty foods. 6 hours before the procedure - stop eating light meals or foods, such as toast or cereal. 6 hours before the procedure - stop drinking milk or drinks that contain milk. 2 hours before the procedure - stop drinking clear liquids. Medicines Ask your health care provider about: Changing or stopping your regular medicines. This is especially important if you are taking diabetes medicines or blood thinners. Taking medicines such as aspirin and ibuprofen. These medicines can thin your blood. Do not take these medicines unless your health care provider tells you to take them. Taking over-the-counter medicines, vitamins, herbs, and supplements. General instructions Prior to surgery, your health care provider may do a procedure to locate and mark the tumor area in your breast (localization). This will help guide your surgeon to where the incision will be made. This may be done with: Imaging, such as a mammogram, ultrasound, or MRI. Insertion of a small wire, clip, or seed, or an implant that will reflect a radar signal. You may have screening tests or exams to get baseline measurements of your arm. These can be compared to measurements done after surgery to monitor for swelling (lymphedema) that can develop after having lymph nodes removed. Ask your health care provider: How your surgery site will be marked. What steps will be taken to help prevent infection. These may include: Washing skin with a germ-killing soap. Taking antibiotic medicine. Plan to have someone take you home  from the hospital or clinic. Plan to have a responsible adult care for you for at least 24 hours after you leave the hospital or clinic. This is important. What happens during the  procedure?  An IV will be inserted into one of your veins. You will be given one or more of the following: A medicine to help you relax (sedative). A medicine to numb the area (local anesthetic). A medicine to make you fall asleep (general anesthetic). Your health care provider will use a kind of electric scalpel that uses heat to reduce bleeding (electrocautery knife). A curved incision that follows the natural curve of your breast will be made. This type of incision will allow for minimal scarring and better healing. The tumor will be removed along with some of the tissue around it. This will be sent to the lab for testing. Your health care provider may also remove lymph nodes at this time if needed. If the tumor is close to the muscles over your chest, some muscle tissue may also be removed. A small drain tube may be inserted into your breast area or armpit to collect fluid that may build up after surgery. This tube will be connected to a suction bulb on the outside of your body to remove the fluid. The incision will be closed with stitches (sutures). A bandage (dressing) may be placed over the incision. The procedure may vary among health care providers and hospitals. What happens after the procedure? Your blood pressure, heart rate, breathing rate, and blood oxygen level will be monitored until you leave the hospital or clinic. You will be given medicine for pain as needed. Your IV will be removed when you are able to eat and drink by mouth. You will be encouraged to get up and walk as soon as you can. This is important to improve blood flow and breathing. Ask for help if you feel weak or unsteady. You may have: A drain tube in place for 2-3 days to prevent a collection of blood (hematoma) from developing in the breast. You will be given instructions about caring for the drain before you go home. A pressure bandage applied for 1-2 days to prevent bleeding or swelling. Your pressure bandage  may look like a thick piece of fabric or an elastic wrap. Ask your health care provider how to care for your bandage at home. You may be given a tight sleeve to wear over your arm on the side of your surgery. You should wear this sleeve as told by your health care provider. Do not drive for 24 hours if you were given a sedative during your procedure. Summary A lumpectomy, sometimes called a partial mastectomy, is surgery to remove a cancerous tumor or mass (the lump) from a breast. During a lumpectomy, the portion of the breast that contains the tumor is removed. Lymph nodes under your arm may also be removed and tested to find out if the cancer has spread. Plan to have someone take you home from the hospital or clinic. You may have a drain tube in place for 2-3 days to prevent a collection of blood (hematoma) from developing in the breast. You will be given instructions about caring for the drain before you go home. This information is not intended to replace advice given to you by your health care provider. Make sure you discuss any questions you have with your health care provider. Document Revised: 12/30/2018 Document Reviewed: 12/30/2018 Elsevier Patient Education  [place].

## 2021-03-22 NOTE — Progress Notes (Signed)
Patient ID: Carol Becker, female   DOB: 10/01/1960, 60 y.o.   MRN: [id number]  Chief Complaint: DCIS left breast  History of Present Illness Carol Becker is a 60 y.o. female with microcalcifications noted on screening mammography, a cluster of 1 cm is noted in the outer lower quadrant of the left breast.  Stereotactic biopsy revealed comedo necrosis DCIS.  No evidence of invasive or microinvasive on core biopsy specimens.  Prognostic indicators not available today. She has a family history of paternal and maternal grandmothers having breast cancer both late in age.  Her hormonal history includes use of birth control, she is currently postmenopausal.  She began menstruating at the age of 33 and was pregnant 3 times having her first child at the age of 12.  She reports breast-feeding, and denies any other breast issues or history.  She is currently bruised from her recent biopsy otherwise had not done breast self-examinations but did complete annual mammography with the exception of 2020.  She is scheduled to see Dr. Rogue Bussing tomorrow.  Past Medical History Past Medical History:  Diagnosis Date   Cervical spinal stenosis    GERD (gastroesophageal reflux disease)    Hyperlipidemia    Osteopenia    Sleep apnea    Thyroid disease       Past Surgical History:  Procedure Laterality Date   ANTERIOR / POSTERIOR COMBINED FUSION CERVICAL SPINE     BREAST BIOPSY Left 03/15/2021   Stereo bx, Ribbon Clip, path pending   COLONOSCOPY WITH PROPOFOL N/A 11/05/2020   Procedure: COLONOSCOPY WITH PROPOFOL;  Surgeon: Lucilla Lame, MD;  Location: [place];  Service: Endoscopy;  Laterality: N/A;   KNEE ARTHROSCOPY WITH EXCISION BAKER'S CYST     POLYPECTOMY  11/05/2020   Procedure: POLYPECTOMY;  Surgeon: Lucilla Lame, MD;  Location: [place];  Service: Endoscopy;;   trapeziectomy     done at [facility] 06/16/20   TUBAL LIGATION      Allergies  Allergen Reactions   Crestor  [Rosuvastatin Calcium] Other (See Comments)    Indigestion   Rosuvastatin Calcium Other (See Comments)    Indigestion   Sulfa Antibiotics Nausea Only   Zithromax [Azithromycin] Other (See Comments)    Not effective    Current Outpatient Medications  Medication Sig Dispense Refill   busPIRone (BUSPAR) 15 MG tablet TAKE 1 TO 2 TABLETS(7.5 TO 15 MG) BY MOUTH THREE TIMES DAILY 270 tablet 1   diclofenac Sodium (VOLTAREN) 1 % GEL Apply 4 g topically 4 (four) times daily. 100 g 12   gabapentin (NEURONTIN) 600 MG tablet TAKE 1 TABLET(600 MG) BY MOUTH TWICE DAILY 180 tablet 1   Insulin Pen Needle (PEN NEEDLES) 30G X 5 MM MISC 1 each by Does not apply route once a week. 100 each 12   liraglutide (VICTOZA) 18 MG/3ML SOPN Start 0.6mg  SQ once a day for 7 days, then increase to 1.2mg  once a day 6 mL 0   liraglutide (VICTOZA) 18 MG/3ML SOPN Inject 1.8 mg into the skin daily. 9 mL 1   meloxicam (MOBIC) 15 MG tablet Take 1 tablet (15 mg total) by mouth daily. 90 tablet 1   methimazole (TAPAZOLE) 5 MG tablet methimazole 5 mg tablet     methocarbamol (ROBAXIN) 500 MG tablet TAKE 1 TO 2 TABLETS BY MOUTH FOUR TIMES DAILY FOR MUSCLE SPASMS OR TIGHTNESS     nortriptyline (PAMELOR) 10 MG capsule Take 20 mg by mouth.     No current facility-administered medications  for this visit.    Family History Family History  Problem Relation Age of Onset   Heart disease Father        CAD   Heart disease Sister 50       CAD   Cancer Maternal Grandmother        breast   Breast cancer Maternal Grandmother 84   Cancer Paternal Grandmother        breast   Breast cancer Paternal Grandmother 65   Bipolar disorder Daughter    Alzheimer's disease Maternal Grandfather    Heart disease Cousin    Parkinson's disease Sister       Social History Social History   Tobacco Use   Smoking status: Former    Types: Cigarettes    Quit date: 04/06/2011    Years since quitting: 9.9   Smokeless tobacco: Never  Vaping Use    Vaping Use: Former  Substance Use Topics   Alcohol use: Yes    Comment: On Occasion   Drug use: No        Review of Systems  Constitutional: Negative.   HENT: Negative.    Eyes:  Positive for pain (Vestibular migraines).  Respiratory: Negative.    Cardiovascular: Negative.   Gastrointestinal:  Positive for heartburn.  Genitourinary: Negative.   Skin: Negative.   Neurological:  Positive for dizziness (BPPV).  Psychiatric/Behavioral:  Positive for depression.      Physical Exam There were no vitals taken for this visit.   CONSTITUTIONAL: Well developed, and nourished, appropriately responsive and aware without distress.   EYES: Sclera non-icteric.  She presents today with her husband. EARS, NOSE, MOUTH AND THROAT: Mask worn.   Hearing is intact to voice.  NECK: Trachea is midline, and there is no jugular venous distension.  LYMPH NODES:  Lymph nodes in the neck are not enlarged. RESPIRATORY:  Lungs are clear, and breath sounds are equal bilaterally. Normal respiratory effort without pathologic use of accessory muscles. CARDIOVASCULAR: Heart is regular in rate and rhythm. GI: The abdomen is  soft, nontender, and nondistended.  GU: Left breast remarkably bruised, patient self-reported golf ball size mass, consistent with probable hematoma.  Not examined. MUSCULOSKELETAL:  Symmetrical muscle tone appreciated in all four extremities.    SKIN: Skin turgor is normal. No pathologic skin lesions appreciated.  NEUROLOGIC:  Motor and sensation appear grossly normal.  Cranial nerves are grossly without defect. PSYCH:  Alert and oriented to person, place and time. Affect is appropriate for situation.  Data Reviewed I have personally reviewed what is currently available of the patient's imaging, recent labs and medical records.   Labs:  CBC Latest Ref Rng & Units 08/27/2020 03/05/2020 01/06/2020  WBC 3.4 - 10.8 x10E3/uL 7.0 7.0 11.2(H)  Hemoglobin 11.1 - 15.9 g/dL 14.3 14.5 13.7   Hematocrit 34.0 - 46.6 % 43.0 43.1 42.0  Platelets 150 - 450 x10E3/uL 261 277 254   CMP Latest Ref Rng & Units 02/11/2021 08/27/2020 03/05/2020  Glucose 65 - 99 mg/dL 114(H) 175(H) 143(H)  BUN 6 - 24 mg/dL 13 11 13   Creatinine 0.57 - 1.00 mg/dL 0.88 0.81 0.87  Sodium 134 - 144 mmol/L 141 136 136  Potassium 3.5 - 5.2 mmol/L 4.3 4.3 4.6  Chloride 96 - 106 mmol/L 102 100 101  CO2 20 - 29 mmol/L 24 22 23   Calcium 8.7 - 10.2 mg/dL 9.6 9.9 9.7  Total Protein 6.0 - 8.5 g/dL 7.1 7.1 7.3  Total Bilirubin 0.0 - 1.2 mg/dL 0.2 0.3  0.4  Alkaline Phos 44 - 121 IU/L 99 114 87  AST 0 - 40 IU/L 16 46(H) 35  ALT 0 - 32 IU/L 29 50(H) 45(H)   SURGICAL PATHOLOGY  CASE: [id number]  PATIENT: [facility]  Surgical Pathology Report   Specimen Submitted:  A. Breast, left lower outer   Clinical History: New grouped calcifications, rule out DCIS.  Ribbon-shaped clip placed following stereotactic biopsy of LEFT breast,  lower outer quadrant.   DIAGNOSIS:  A. BREAST, LEFT LOWER OUTER QUADRANT; STEREOTACTIC CORE NEEDLE BIOPSY:  - DUCTAL CARCINOMA IN SITU, HIGH-GRADE, WITH COMEDONECROSIS AND  ASSOCIATED CALCIFICATIONS.  - NEGATIVE FOR INVASIVE CARCINOMA.   Comment:  Intact DCIS is identified in 5 out of 6 sampled tissue blocks, measuring  up to 9 mm in greatest linear extent. Immunohistochemical studies  directed against myoepithelial markers were performed to better clarify  a focal area initially thought suspicious for microinvasion. These  stains demonstrate an intact myoepithelial layer in this region,  indicating carcinoma is still in situ.   Although the specimen is received with diagram indicating tissue present  in section A, no tissue is identified upon opening the specimen  container. This is reviewed together with concurrent imaging studies.   Ancillary hormone receptor testing will be deferred to the final  excision.   IHC slides were prepared by Launa Grill, [place]. All  controls stained  appropriately.   This test was developed and its performance characteristics determined  by [facility]. It has not been cleared or approved by the [place] Food and Drug  Administration. The FDA does not require this test to go through  premarket FDA review. This test is used for clinical purposes. It should  not be regarded as investigational or for research. This laboratory is  certified under the Clinical Laboratory Improvement Amendments (CLIA) as  qualified to perform high complexity clinical laboratory testing.   GROSS DESCRIPTION:  A. Labeled: Left breast stereo biopsy calcs lower outer  Received: in a formalin-filled [place] collection device  Specimen radiograph image(s) available for review  Time/Date in fixative: Collected at 9:11 AM on 03/15/2021 and placed in  formalin at 9:15 AM on 03/15/2021  Cold ischemic time: Less than 5 minutes  Total fixation time: Approximately 8 hours  Core pieces: Multiple  Measurement: Aggregate, 7.7 x 1.4 x 0.5 cm  Description / comments: Received are cores and fragments of yellow  fibrofatty tissue.  The accompanying diagram has sections A, B, C, D, E,  and F checked.  No distinct tissue is grossly identified in section A.  Inked: Green  Entirely submitted in cassette(s):   1 - section B  2 - section C  3 - section D  4 - section E  5 - section F  6 - remaining tissue from sections G, H, I and J   RB 03/15/2021   Final Diagnosis performed by Allena Napoleon, MD.   Electronically signed  03/17/2021 9:12:28AM    Imaging: Radiology review:   EXAM: DIGITAL DIAGNOSTIC BILATERAL MAMMOGRAM WITH TOMOSYNTHESIS AND CAD   TECHNIQUE: Bilateral digital diagnostic mammography and breast tomosynthesis was performed. The images were evaluated with computer-aided detection.   COMPARISON:  Previous exam(s).   ACR Breast Density Category c: The breast tissue is heterogeneously dense, which may obscure small masses.   FINDINGS: Right  breast: The previously noted possible asymmetry in the central breast on MLO view only does not persist on additional imaging, consistent with overlapping fibroglandular tissue.   Left breast: The  previously noted possible distortion in the lower posterior breast on MLO view only does not persist with additional imaging, consistent with overlapping fibroglandular tissue.   Grouped pleomorphic calcifications spanning 1.0 cm in the outer lower left breast at anterior depth, new compared to prior mammogram 01/08/2020.   IMPRESSION: LEFT breast: New grouped calcifications spanning 1 cm in the outer lower left breast at anterior depth. Recommend stereotactic guided biopsy.   RIGHT breast: No mammographic findings of malignancy.   RECOMMENDATION: Left breast stereotactic guided biopsy. We will obtain an order from the patient's clinical provider and schedule the patient at her earliest convenience.   I have discussed the findings and recommendations with the patient. If applicable, a reminder letter will be sent to the patient regarding the next appointment.   BI-RADS CATEGORY  4: Suspicious.     Electronically Signed   By: Ileana Roup M.D.   On: 03/08/2021 10:17   Within last 24 hrs: No results found.  Assessment    DCIS left breast, associated microcalcifications.   Plan    I discussed the available options with the patient. The risk of recurrence is similar between mastectomy and lumpectomy with radiation.  I also discussed that given the small size of the cancer would recommend localization lumpectomy with probable radiation to follow.  I also discussed that we would not need to do a sentinel lymph node biopsy as there is no evidence of invasive cancer, but she could be upstaged following excision.  Due to the location of the lesion I doubt lymphatics will be sufficiently interrupted to avoid a defer sentinel lymph node biopsy.   Options of mastectomy/reconstruction  discussed.  Explained to the patient that after her surgical treatment additional treatment will depend on her prognostic indicators and stage.    I discussed risks of bleeding, infection, damage to surrounding tissues, having positive margins, needing further resection, damage to nerves causing arm numbness or difficulty raising arm, causing lymphedema in the arm; as well as anesthesia risks of MI, stroke, prolonged ventilation, pulmonary embolism, thrombosis and even death.   Patient was given the opportunity to ask questions and have them answered.  They would like to proceed with left breast RFID localized lumpectomy.     Face-to-face time spent with the patient and accompanying care providers(if present) was 45 minutes, with more than 50% of the time spent counseling, educating, and coordinating care of the patient.    These notes generated with voice recognition software. I apologize for typographical errors.  Ronny Bacon M.D., FACS 03/22/2021, 11:26 AM

## 2021-03-23 ENCOUNTER — Telehealth: Payer: Self-pay | Admitting: Surgery

## 2021-03-23 ENCOUNTER — Inpatient Hospital Stay: Payer: Managed Care, Other (non HMO) | Attending: Internal Medicine | Admitting: Internal Medicine

## 2021-03-23 ENCOUNTER — Inpatient Hospital Stay: Payer: Managed Care, Other (non HMO)

## 2021-03-23 ENCOUNTER — Encounter: Payer: Self-pay | Admitting: Internal Medicine

## 2021-03-23 ENCOUNTER — Encounter: Payer: Self-pay | Admitting: *Deleted

## 2021-03-23 VITALS — BP 125/84 | HR 91 | Temp 97.8°F | Resp 18 | Wt 212.5 lb

## 2021-03-23 DIAGNOSIS — Z803 Family history of malignant neoplasm of breast: Secondary | ICD-10-CM | POA: Diagnosis not present

## 2021-03-23 DIAGNOSIS — D0512 Intraductal carcinoma in situ of left breast: Secondary | ICD-10-CM | POA: Insufficient documentation

## 2021-03-23 DIAGNOSIS — Z79899 Other long term (current) drug therapy: Secondary | ICD-10-CM | POA: Insufficient documentation

## 2021-03-23 DIAGNOSIS — L7632 Postprocedural hematoma of skin and subcutaneous tissue following other procedure: Secondary | ICD-10-CM | POA: Diagnosis not present

## 2021-03-23 DIAGNOSIS — Z87891 Personal history of nicotine dependence: Secondary | ICD-10-CM | POA: Insufficient documentation

## 2021-03-23 DIAGNOSIS — R7303 Prediabetes: Secondary | ICD-10-CM | POA: Diagnosis not present

## 2021-03-23 NOTE — Progress Notes (Signed)
[place] OFFICE PROGRESS NOTE  Patient Care Team: Valerie Roys, DO as PCP - General (Family Medicine)  Cancer Staging No matching staging information was found for the patient.   Oncology History Overview Note  DIAGNOSIS:  A. BREAST, LEFT LOWER OUTER QUADRANT; STEREOTACTIC CORE NEEDLE BIOPSY:  - DUCTAL CARCINOMA IN SITU, HIGH-GRADE, WITH COMEDONECROSIS AND  ASSOCIATED CALCIFICATIONS.  - NEGATIVE FOR INVASIVE CARCINOMA.  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  female pleasant female patient above history  with no prior history of breast cancer/or malignancies has been referred to [place] for further evaluation recommendations for new diagnosis of DCIS.   Patient had a routine screening mammogram which was abnormal; which led to diagnostic mammogram ultrasound.  Subsequent biopsy/pathology as above   Patient notes a discoloration of the right breast post biopsy-mild to moderate discomfort postbiopsy.  Otherwise denies any unusual new lumps or bumps.  Review of Systems  Constitutional:  Negative for chills, diaphoresis, fever, malaise/fatigue and weight loss.  HENT:  Negative for nosebleeds and sore throat.   Eyes:  Negative for double vision.  Respiratory:  Negative for cough, hemoptysis, sputum production, shortness of breath and wheezing.   Cardiovascular:  Negative for chest pain, palpitations, orthopnea and leg swelling.  Gastrointestinal:  Negative for abdominal pain, blood in stool, constipation, diarrhea, heartburn, melena, nausea and vomiting.  Genitourinary:  Negative for dysuria, frequency and urgency.  Musculoskeletal:  Negative for back pain and joint pain.  Skin: Negative.  Negative for itching and rash.   Neurological:  Negative for dizziness, tingling, focal weakness, weakness and headaches.  Endo/Heme/Allergies:  Does not bruise/bleed easily.  Psychiatric/Behavioral:  Negative for depression. The patient is not nervous/anxious and does not have insomnia.      PAST MEDICAL HISTORY :  Past Medical History:  Diagnosis Date   Breast cancer ([place])    Cervical spinal stenosis    GERD (gastroesophageal reflux disease)    Hyperlipidemia    Lung cancer ([place])    Osteopenia    Sleep apnea    Thyroid disease     PAST SURGICAL HISTORY :   Past Surgical History:  Procedure Laterality Date   ANTERIOR / POSTERIOR COMBINED FUSION CERVICAL SPINE     BREAST BIOPSY Left 03/15/2021   Stereo bx, Ribbon Clip, path pending   COLONOSCOPY WITH PROPOFOL N/A 11/05/2020   Procedure: COLONOSCOPY WITH PROPOFOL;  Surgeon: Lucilla Lame, MD;  Location: [place];  Service: Endoscopy;  Laterality: N/A;   KNEE ARTHROSCOPY WITH EXCISION BAKER'S CYST     POLYPECTOMY  11/05/2020   Procedure: POLYPECTOMY;  Surgeon: Lucilla Lame, MD;  Location: [place];  Service: Endoscopy;;   trapeziectomy     done at [facility] 06/16/20   TUBAL LIGATION      FAMILY HISTORY :   Family History  Problem Relation Age of Onset   Heart disease Father        CAD   Heart disease Sister 51       CAD   Cancer Maternal Grandmother        breast   Breast cancer Maternal Grandmother 4   Cancer Paternal Grandmother        breast   Breast cancer Paternal Grandmother 60  Bipolar disorder Daughter    Alzheimer's disease Maternal Grandfather    Heart disease Cousin    Parkinson's disease Sister     SOCIAL HISTORY:   Social History   Tobacco Use   Smoking status: Former    Types: Cigarettes    Quit date: 04/06/2011    Years since quitting: 9.9   Smokeless tobacco: Never  Vaping Use   Vaping Use: Former  Substance Use Topics   Alcohol use: Yes    Comment: On Occasion   Drug use: No    ALLERGIES:  is  allergic to crestor [rosuvastatin calcium], rosuvastatin calcium, sulfa antibiotics, and zithromax [azithromycin].  MEDICATIONS:  Current Outpatient Medications  Medication Sig Dispense Refill   busPIRone (BUSPAR) 15 MG tablet TAKE 1 TO 2 TABLETS(7.5 TO 15 MG) BY MOUTH THREE TIMES DAILY 270 tablet 1   diclofenac Sodium (VOLTAREN) 1 % GEL Apply 4 g topically 4 (four) times daily. 100 g 12   gabapentin (NEURONTIN) 600 MG tablet TAKE 1 TABLET(600 MG) BY MOUTH TWICE DAILY 180 tablet 1   Insulin Pen Needle (PEN NEEDLES) 30G X 5 MM MISC 1 each by Does not apply route once a week. 100 each 12   liraglutide (VICTOZA) 18 MG/3ML SOPN Start 0.6mg  SQ once a day for 7 days, then increase to 1.2mg  once a day 6 mL 0   liraglutide (VICTOZA) 18 MG/3ML SOPN Inject 1.8 mg into the skin daily. 9 mL 1   methimazole (TAPAZOLE) 5 MG tablet methimazole 5 mg tablet     methocarbamol (ROBAXIN) 500 MG tablet TAKE 1 TO 2 TABLETS BY MOUTH FOUR TIMES DAILY FOR MUSCLE SPASMS OR TIGHTNESS     nortriptyline (PAMELOR) 10 MG capsule Take 20 mg by mouth.     No current facility-administered medications for this visit.    PHYSICAL EXAMINATION: ECOG PERFORMANCE STATUS: 0 - Asymptomatic  BP 125/84   Pulse 91   Temp 97.8 F (36.6 C)   Resp 18   Wt 212 lb 8 oz (96.4 kg)   SpO2 97%   BMI 36.48 kg/m   Filed Weights   03/23/21 1418  Weight: 212 lb 8 oz (96.4 kg)    Physical Exam Vitals and nursing note reviewed.  HENT:     Head: Normocephalic and atraumatic.     Mouth/Throat:     Pharynx: Oropharynx is clear.  Eyes:     Extraocular Movements: Extraocular movements intact.     Pupils: Pupils are equal, round, and reactive to light.  Cardiovascular:     Rate and Rhythm: Normal rate and regular rhythm.  Pulmonary:     Effort: Pulmonary effort is normal.     Breath sounds: Normal breath sounds.  Abdominal:     Palpations: Abdomen is soft.  Musculoskeletal:        General: Normal range of motion.     Cervical  back: Normal range of motion.  Skin:    General: Skin is warm.     Comments: Left breast-lower quadrant bruised/warm to touch.  No evidence of infection.   Neurological:     General: No focal deficit present.     Mental Status: She is alert and oriented to person, place, and time.  Psychiatric:        Behavior: Behavior normal.        Judgment: Judgment normal.       LABORATORY DATA:  I have reviewed the data as listed    Component Value Date/Time   NA 141  02/11/2021 1521   K 4.3 02/11/2021 1521   CL 102 02/11/2021 1521   CO2 24 02/11/2021 1521   GLUCOSE 114 (H) 02/11/2021 1521   BUN 13 02/11/2021 1521   CREATININE 0.88 02/11/2021 1521   CALCIUM 9.6 02/11/2021 1521   PROT 7.1 02/11/2021 1521   ALBUMIN 4.3 02/11/2021 1521   AST 16 02/11/2021 1521   ALT 29 02/11/2021 1521   ALKPHOS 99 02/11/2021 1521   BILITOT 0.2 02/11/2021 1521   GFRNONAA 80 08/27/2020 1414   GFRAA 93 08/27/2020 1414    No results found for: SPEP, UPEP  Lab Results  Component Value Date   WBC 7.0 08/27/2020   NEUTROABS 4.0 08/27/2020   HGB 14.3 08/27/2020   HCT 43.0 08/27/2020   MCV 89 08/27/2020   PLT 261 08/27/2020      Chemistry      Component Value Date/Time   NA 141 02/11/2021 1521   K 4.3 02/11/2021 1521   CL 102 02/11/2021 1521   CO2 24 02/11/2021 1521   BUN 13 02/11/2021 1521   CREATININE 0.88 02/11/2021 1521      Component Value Date/Time   CALCIUM 9.6 02/11/2021 1521   ALKPHOS 99 02/11/2021 1521   AST 16 02/11/2021 1521   ALT 29 02/11/2021 1521   BILITOT 0.2 02/11/2021 1521       RADIOGRAPHIC STUDIES: I have personally reviewed the radiological images as listed and agreed with the findings in the report. No results found.   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     # I had a long discussion with patient regarding  pathology/natural history of DCIS.  Also had a long discussion regarding the treatment options which include lumpectomy.   # Also discussed regarding radiation therapy post lumpectomy.  In general radiation the cuts down the risk of recurrence by 50%.  Would recommend evaluation with Dr. Donella Stade post surgery.  Patient awaiting surgical evaluation at [facility]; referral to PCP.  Discussed with Sheena.  # Also discussed option of antihormone therapy-like anastrozole/Aromasin to cut down the risk of development development of ipsilateral/contralateral invasive/noninvasive breast cancer.     # Genetics: Discussed with patient and family regarding potential etiologies of breast cancer-sporadic versus genetics.  I suspect patient's malignancy is sporadic. Discussed regarding evaluation with genetic counseling.    Will refer to genetic counseling.  # Pre-diabetes/weight loss: Currently on Victoza-PCP.  #Left breast hematoma s/p biopsy-recommend frequent ice packs as needed.  Thank you Dr.Johnson for allowing me to participate in the care of your pleasant patient. Please do not hesitate to contact me with questions or concerns in the interim.  # DISPOSITION: # Genetic counseling re: DICS #  Follow up TBD--Dr.B   Orders Placed This Encounter  Procedures   Ambulatory referral to Genetics    Referral Priority:   Routine    Referral Type:   Consultation    Referral Reason:   Specialty Services Required    Referred to Provider:   Faith Rogue T    Number of Visits Requested:   1    All questions were answered. The patient knows to call the clinic with any problems, questions or concerns.      Cammie Sickle, MD 03/23/2021 8:03 PM

## 2021-03-23 NOTE — Assessment & Plan Note (Addendum)
#   LEFT breast DCIS-high-grade; with comedonecrosis; ER: Pending [planned to done on final pathology].     # I had a long discussion with patient regarding pathology/natural history of DCIS.  Also had a long discussion regarding the treatment options which include lumpectomy.   # Also discussed regarding radiation therapy post lumpectomy.  In general radiation the cuts down the risk of recurrence by 50%.  Would recommend evaluation with Dr. Donella Stade post surgery.  Patient awaiting surgical evaluation at [facility]; referral to PCP.  Discussed with Sheena.  # Also discussed option of antihormone therapy-like anastrozole/Aromasin to cut down the risk of development development of ipsilateral/contralateral invasive/noninvasive breast cancer.     # Genetics: Discussed with patient and family regarding potential etiologies of breast cancer-sporadic versus genetics.  I suspect patient's malignancy is sporadic. Discussed regarding evaluation with genetic counseling.    Will refer to genetic counseling.  # Pre-diabetes/weight loss: Currently on Victoza-PCP.  #Left breast hematoma s/p biopsy-recommend frequent ice packs as needed.  Thank you Dr.Johnson for allowing me to participate in the care of your pleasant patient. Please do not hesitate to contact me with questions or concerns in the interim.  # DISPOSITION: # Genetic counseling re: DICS #  Follow up TBD--Dr.B

## 2021-03-23 NOTE — Telephone Encounter (Signed)
Incoming call from patient.  She wants to cancel her surgery with Dr. Christian Mate for 04/13/21.  When asked to reschedule, she said "no".  When asked why, patient stated she did not feel comfortable with [place], will be going elsewhere.

## 2021-03-23 NOTE — Patient Instructions (Signed)
#   call us back if not heard from [place] surgery office in next week.  # Call us back in 2-3 weeks post surgery.

## 2021-03-23 NOTE — Progress Notes (Unsigned)
Returned patient's call.  She states she would like to see a breast surgeon at the [facility].  I have sent a message to her PCP and NP at [facility].  Encouraged her to call her PCP if she has not heard from them by tomorrow.

## 2021-03-28 ENCOUNTER — Telehealth: Payer: Self-pay

## 2021-03-28 NOTE — Telephone Encounter (Signed)
Copied from [place] [id number]. Topic: Referral - Status >> Mar 28, 2021 12:03 PM Erick Blinks wrote: Reason for CRM: Major called from [facility] regarding the patient's current referral, Major is requesting a for new fax submission that removes [facility] and replaces it with [facility]. Please advise   Best contact: [phone] option 1 (Major) New Fax: [phone] Attn: Major   Routing to referral coordinator.

## 2021-04-06 ENCOUNTER — Other Ambulatory Visit: Payer: Managed Care, Other (non HMO)

## 2021-04-06 ENCOUNTER — Encounter: Payer: Self-pay | Admitting: Family Medicine

## 2021-04-07 NOTE — Telephone Encounter (Signed)
Virtual appt please

## 2021-04-07 NOTE — Telephone Encounter (Signed)
Appt scheduled for tomorrow at 10:40

## 2021-04-08 ENCOUNTER — Encounter: Payer: Self-pay | Admitting: Family Medicine

## 2021-04-08 ENCOUNTER — Telehealth (INDEPENDENT_AMBULATORY_CARE_PROVIDER_SITE_OTHER): Payer: Managed Care, Other (non HMO) | Admitting: Family Medicine

## 2021-04-08 ENCOUNTER — Other Ambulatory Visit: Payer: Self-pay

## 2021-04-08 VITALS — BP 121/86 | HR 103 | Temp 98.6°F | Ht 64.0 in | Wt 202.0 lb

## 2021-04-08 DIAGNOSIS — U071 COVID-19: Secondary | ICD-10-CM | POA: Diagnosis not present

## 2021-04-08 MED ORDER — LIDOCAINE VISCOUS HCL 2 % MT SOLN: 15.0000 mL | OROMUCOSAL | 0 refills | Status: DC | PRN

## 2021-04-08 MED ORDER — MOLNUPIRAVIR EUA 200MG CAPSULE: 4.0000 | ORAL_CAPSULE | Freq: Two times a day (BID) | ORAL | 0 refills | Status: AC

## 2021-04-08 NOTE — Progress Notes (Signed)
BP 121/86   Pulse (!) 103   Temp 98.6 F (37 C)   Ht 5\' 4"  (1.626 m)   Wt 202 lb (91.6 kg)   BMI 34.67 kg/m    Subjective:    Patient ID: Carol Becker, female    DOB: 1961-07-03, 60 y.o.   MRN: [id number]  HPI: Carol Becker is a 61 y.o. female  Chief Complaint  Patient presents with   Covid Positive   UPPER RESPIRATORY TRACT INFECTION Duration: 4 days Worst symptom: sore throat Fever: yes Cough: no Shortness of breath: no Wheezing: no Chest pain: no Chest tightness: no Chest congestion: no Nasal congestion: yes Runny nose: yes Post nasal drip: no Sneezing: no Sore throat: yes Swollen glands: no Sinus pressure: no Headache: no Face pain: no Toothache: no Ear pain: no  Ear pressure: no  Eyes red/itching:no Eye drainage/crusting: no  Vomiting: no Rash: no Fatigue: yes Sick contacts: yes Strep contacts: no  Context: stable Recurrent sinusitis: no Relief with OTC cold/cough medications: no  Treatments attempted: cold/sinus and anti-histamine   Relevant past medical, surgical, family and social history reviewed and updated as indicated. Interim medical history since our last visit reviewed. Allergies and medications reviewed and updated.  Review of Systems  Constitutional:  Positive for fatigue and fever. Negative for activity change, appetite change, chills, diaphoresis and unexpected weight change.  HENT:  Positive for sore throat. Negative for congestion, dental problem, drooling, ear discharge, ear pain, facial swelling, hearing loss, mouth sores, nosebleeds, postnasal drip, rhinorrhea, sinus pressure, sinus pain, sneezing, tinnitus, trouble swallowing and voice change.   Eyes: Negative.   Respiratory: Negative.    Cardiovascular: Negative.   Gastrointestinal: Negative.   Neurological: Negative.   Psychiatric/Behavioral: Negative.     Per HPI unless specifically indicated above     Objective:    BP 121/86   Pulse (!) 103   Temp 98.6  F (37 C)   Ht 5\' 4"  (1.626 m)   Wt 202 lb (91.6 kg)   BMI 34.67 kg/m   Wt Readings from Last 3 Encounters:  04/08/21 202 lb (91.6 kg)  03/23/21 212 lb 8 oz (96.4 kg)  03/22/21 211 lb 9.6 oz (96 kg)    Physical Exam Vitals and nursing note reviewed.  Constitutional:      General: She is not in acute distress.    Appearance: Normal appearance. She is not ill-appearing, toxic-appearing or diaphoretic.  HENT:     Head: Normocephalic and atraumatic.     Right Ear: External ear normal.     Left Ear: External ear normal.     Nose: Nose normal.     Mouth/Throat:     Mouth: Mucous membranes are moist.     Pharynx: Oropharynx is clear.  Eyes:     General: No scleral icterus.       Right eye: No discharge.        Left eye: No discharge.     Conjunctiva/sclera: Conjunctivae normal.     Pupils: Pupils are equal, round, and reactive to light.  Pulmonary:     Effort: Pulmonary effort is normal. No respiratory distress.     Comments: Speaking in full sentences Musculoskeletal:        General: Normal range of motion.     Cervical back: Normal range of motion.  Skin:    Coloration: Skin is not jaundiced or pale.     Findings: No bruising, erythema, lesion or rash.  Neurological:  Mental Status: She is alert and oriented to person, place, and time. Mental status is at baseline.  Psychiatric:        Mood and Affect: Mood normal.        Behavior: Behavior normal.        Thought Content: Thought content normal.        Judgment: Judgment normal.    Results for orders placed or performed during the hospital encounter of 03/15/21  Surgical pathology  Result Value Ref Range   SURGICAL PATHOLOGY      SURGICAL PATHOLOGY CASE: [id number] PATIENT: [facility] Surgical Pathology Report     Specimen Submitted: A. Breast, left lower outer  Clinical History: New grouped calcifications, rule out DCIS. Ribbon-shaped clip placed following stereotactic biopsy of LEFT  breast, lower outer quadrant.    DIAGNOSIS: A. BREAST, LEFT LOWER OUTER QUADRANT; STEREOTACTIC CORE NEEDLE BIOPSY: - DUCTAL CARCINOMA IN SITU, HIGH-GRADE, WITH COMEDONECROSIS AND ASSOCIATED CALCIFICATIONS. - NEGATIVE FOR INVASIVE CARCINOMA.  Comment: Intact DCIS is identified in 5 out of 6 sampled tissue blocks, measuring up to 9 mm in greatest linear extent. Immunohistochemical studies directed against myoepithelial markers were performed to better clarify a focal area initially thought suspicious for microinvasion. These stains demonstrate an intact myoepithelial layer in this region, indicating carcinoma is still in situ.  Although the specimen is received with diagram indicating tissue pr esent in section A, no tissue is identified upon opening the specimen container. This is reviewed together with concurrent imaging studies.  Ancillary hormone receptor testing will be deferred to the final excision.  IHC slides were prepared by Launa Grill, [place]. All controls stained appropriately.  This test was developed and its performance characteristics determined by [facility]. It has not been cleared or approved by the [place] Food and Drug Administration. The FDA does not require this test to go through premarket FDA review. This test is used for clinical purposes. It should not be regarded as investigational or for research. This laboratory is certified under the Clinical Laboratory Improvement Amendments (CLIA) as qualified to perform high complexity clinical laboratory testing.  GROSS DESCRIPTION: A. Labeled: Left breast stereo biopsy calcs lower outer Received: in a formalin-filled [place] collection device Specimen radiograph image(s) available for review  Time/Date in fixative: Collected at 9:11 AM on 03/15/2021 and placed in formalin at 9:15 AM on 03/15/2021 Cold ischemic time: Less than 5 minutes Total fixation time: Approximately 8 hours Core pieces: Multiple Measurement:  Aggregate, 7.7 x 1.4 x 0.5 cm Description / comments: Received are cores and fragments of yellow fibrofatty tissue.  The accompanying diagram has sections A, B, C, D, E, and F checked.  No distinct tissue is grossly identified in section A. Inked: Green Entirely submitted in cassette(s):  1 - section B 2 - section C 3 - section D 4 - section E 5 - section F 6 - remaining tissue from sections G, H, I and J   RB 03/15/2021  Final Diagnosis performed by Allena Napoleon, MD.   Electronically signed 03/17/2021 9:12:28AM The electronic signature indicates that the named Attending Pathologist has evaluated the specimen Technical component performed at [facility], [address] [place], [place] [address] Lab: [id number] Dir: Rush Farmer, MD, MMM  Professio nal component performed at [facility], [facility], [place], [place], [place] [address] Lab: [phone] Dir: Dellia Nims. Reuel Derby, MD       Assessment & Plan:   Problem List Items Addressed This Visit   None Visit Diagnoses  COVID-19    -  Primary   will treat with mulnopirovir and lidocaine. Call if not getting better or getting worse. Continue to monitor.   Relevant Medications   molnupiravir EUA (LAGEVRIO) 200 mg CAPS capsule        Follow up plan: Return if symptoms worsen or fail to improve.   This visit was completed via video visit through MyChart due to the restrictions of the COVID-19 pandemic. All issues as above were discussed and addressed. Physical exam was done as above through visual confirmation on video through MyChart. If it was felt that the patient should be evaluated in the office, they were directed there. The patient verbally consented to this visit. Location of the patient: home Location of the provider: work Those involved with this call:  Provider: Park Liter, DO CMA:  Marnette Burgess, CMA Front Desk/Registration: Leota Jacobsen  Time spent on call:  15 minutes with patient  face to face via video conference. More than 50% of this time was spent in counseling and coordination of care. 23 minutes total spent in review of patient's record and preparation of their chart.

## 2021-04-12 ENCOUNTER — Inpatient Hospital Stay: Payer: Managed Care, Other (non HMO) | Admitting: Licensed Clinical Social Worker

## 2021-04-12 ENCOUNTER — Encounter: Payer: Self-pay | Admitting: Licensed Clinical Social Worker

## 2021-04-12 ENCOUNTER — Other Ambulatory Visit: Payer: Self-pay

## 2021-04-12 ENCOUNTER — Inpatient Hospital Stay: Payer: Managed Care, Other (non HMO) | Attending: Internal Medicine | Admitting: Licensed Clinical Social Worker

## 2021-04-12 ENCOUNTER — Inpatient Hospital Stay: Payer: Managed Care, Other (non HMO)

## 2021-04-12 DIAGNOSIS — Z803 Family history of malignant neoplasm of breast: Secondary | ICD-10-CM | POA: Diagnosis not present

## 2021-04-12 DIAGNOSIS — D0512 Intraductal carcinoma in situ of left breast: Secondary | ICD-10-CM

## 2021-04-12 NOTE — Progress Notes (Signed)
REFERRING PROVIDER: Cammie Sickle, MD Carol Becker,  [place] [address]  PRIMARY PROVIDER:  Park Liter P, DO  PRIMARY REASON FOR VISIT:  1. 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 Family history of breast cancer      HISTORY OF PRESENT ILLNESS:   [name], a 60 y.o. female, was seen for a [facility] cancer genetics consultation at the request of Dr. Rogue Bussing due to a personal and family history of breast cancer.  [name] presents to clinic today to discuss the possibility of a hereditary predisposition to cancer, genetic testing, and to further clarify her future cancer risks, as well as potential cancer risks for family members.   In 2022, at the age of 47, [name] was diagnosed with ductal carcinoma in situ of the left breast, ER/PR deferred to surgical sample. The treatment plan includes lumpectomy which will be performed at [facility], date to be determined, and adjuvant radiation. Patient may use genetic testing results to help inform her surgical decision.  CANCER HISTORY:  Oncology History Overview Note  DIAGNOSIS:  A. BREAST, LEFT LOWER OUTER QUADRANT; STEREOTACTIC CORE NEEDLE BIOPSY:  - DUCTAL CARCINOMA IN SITU, HIGH-GRADE, WITH COMEDONECROSIS AND  ASSOCIATED CALCIFICATIONS.  - NEGATIVE FOR INVASIVE CARCINOMA.  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      RISK FACTORS:  Ovaries intact: yes Hysterectomy: no  Past Medical History:  Diagnosis Date   Breast cancer ([place])    Cervical spinal stenosis    Family history of breast cancer    GERD (gastroesophageal reflux disease)    Hyperlipidemia    Lung cancer ([place])    Osteopenia    Sleep apnea    Thyroid disease     Past Surgical History:  Procedure Laterality Date    ANTERIOR / POSTERIOR COMBINED FUSION CERVICAL SPINE     BREAST BIOPSY Left 03/15/2021   Stereo bx, Ribbon Clip, path pending   COLONOSCOPY WITH PROPOFOL N/A 11/05/2020   Procedure: COLONOSCOPY WITH PROPOFOL;  Surgeon: Lucilla Lame, MD;  Location: [place];  Service: Endoscopy;  Laterality: N/A;   KNEE ARTHROSCOPY WITH EXCISION BAKER'S CYST     POLYPECTOMY  11/05/2020   Procedure: POLYPECTOMY;  Surgeon: Lucilla Lame, MD;  Location: [place];  Service: Endoscopy;;   trapeziectomy     done at [facility] 06/16/20   TUBAL LIGATION      Social History   Socioeconomic History   Marital status: Married    Spouse name: Not on file   Number of children: Not on file   Years of education: Not on file   Highest education level: Not on file  Occupational History   Not on file  Tobacco Use   Smoking status: Former    Types: Cigarettes    Quit date: 04/06/2011    Years since quitting: 10.0   Smokeless tobacco: Never  Vaping Use   Vaping Use: Former  Substance and Sexual Activity   Alcohol use: Yes    Comment: On Occasion   Drug use: No   Sexual activity: Yes    Birth control/protection: Post-menopausal  Other Topics Concern   Not on file  Social History Narrative   Lives in [place] with husband; quit smoking- 30 years; no alcohol abuse. 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  Significant diagnoses are listed below: Family History  Problem Relation Age of Onset   Heart disease Father        CAD   Heart disease Sister 28       CAD   Parkinson's disease Sister    Cancer Maternal Grandmother        breast   Breast cancer Maternal  Grandmother 59   Alzheimer's disease Maternal Grandfather    Cancer Paternal Grandmother        breast   Breast cancer Paternal Grandmother 4   Bipolar disorder Daughter    Heart disease Cousin    Breast cancer Cousin        dx 30s-40s, d. 72s-40s   [name] has 2 daughters (80 and 67). She has 1 sister (24). None have had cancer.  [name] mother died at 15, no history of cancer. Patient had 2 maternal aunts and 1 uncle, no cancers for them or for first cousins. Maternal grandmother had breast cancer in her 23s and grandfather died in his 4s.  [name] father died at 35, no history of cancer. Patient had 2 paternal uncles and 1 aunt. One of her uncles's daughters had breast cancer in her 30s-40s and died in her 26s-40s. Never had genetic testing. No other known cousins with cancer. Paternal grandmother had breast cancer in her 26s and it metastasized to her lung and she passed in her 46s. Grandfather passed in his 45s.  [name] is unaware of previous family history of genetic testing for hereditary cancer risks. Patient's maternal ancestors are of Native American, unknown descent, and paternal ancestors are of [place] and [place] descent. There is no reported Ashkenazi Jewish ancestry. There is no known consanguinity.    GENETIC COUNSELING ASSESSMENT: [name] is a 60 y.o. female with a personal and family history of breast cancer which is somewhat suggestive of a hereditary cancer syndrome and predisposition to cancer. We, therefore, discussed and recommended the following at today's visit.   DISCUSSION: We discussed that approximately 5-10% of breast cancer is hereditary. Most cases of hereditary breast cancer are associated with BRCA1/BRCA2 genes, although there are other genes associated with hereditary cancer as well. Cancers and risks are gene specific.  We discussed that testing is beneficial for several reasons including surgical decision-making for breast cancer,  knowing about other cancer risks, identifying potential screening and risk-reduction options that may be appropriate, and to understand if other family members could be at risk for cancer and allow them to undergo genetic testing.   We reviewed the characteristics, features and inheritance patterns of hereditary cancer syndromes. We also discussed genetic testing, including the appropriate family members to test, the process of testing, insurance coverage and turn-around-time for results. We discussed the implications of a negative, positive and/or variant of uncertain significant result. In order to get genetic test results in a timely manner so that [name] can use these genetic test results for surgical decisions, we recommended [name] pursue genetic testing for the BRCAplus STAT panel. Once complete, we recommend [name] pursue reflex genetic testing to the CancerNext-Expanded+RNA gene panel.  Based on [name]'s personal and family history of cancer, she meets medical criteria for genetic testing. Despite that she meets criteria, she may still have an out of pocket cost.   PLAN: After considering the risks, benefits, and limitations, [name] provided informed consent to pursue genetic testing and the blood sample was sent to [facility] for analysis of the BRCAPlus + CancerNext-Expanded+RNA panels. Results should be available within approximately 5-12 days' time, at which point they will be disclosed by telephone to [name], as will any additional recommendations warranted by these results. [name] will receive a summary of her genetic counseling visit and a copy of her results once available. This information will also be available in Epic.   [name] questions were answered to her satisfaction today. Our contact information was provided should additional questions or concerns arise. Thank you for the referral and allowing [place] to share in the care of your  patient.   Faith Rogue, MS, [facility] Genetic Counselor [email or web address] Phone: [phone]  The patient was seen for a total of 30 minutes in face-to-face genetic counseling.  Patient was seen alone. [facility] intern Raj Janus was present. Dr. Grayland Ormond was available for discussion regarding this case.   _______________________________________________________________________ For Office Staff:  Number of people involved in session: 2 Was an Intern/ student involved with case: yes

## 2021-04-13 ENCOUNTER — Inpatient Hospital Stay: Payer: Managed Care, Other (non HMO)

## 2021-04-13 ENCOUNTER — Ambulatory Visit: Payer: Managed Care, Other (non HMO)

## 2021-04-13 ENCOUNTER — Ambulatory Visit: Admit: 2021-04-13 | Payer: Managed Care, Other (non HMO) | Admitting: Surgery

## 2021-04-13 SURGERY — BREAST LUMPECTOMY WITH RADIOFREQUENCY TAG IDENTIFICATION
Anesthesia: General | Laterality: Left

## 2021-04-14 ENCOUNTER — Inpatient Hospital Stay: Payer: Managed Care, Other (non HMO)

## 2021-04-19 ENCOUNTER — Telehealth: Payer: Self-pay | Admitting: Family Medicine

## 2021-04-19 ENCOUNTER — Encounter: Payer: Self-pay | Admitting: Family Medicine

## 2021-04-19 MED ORDER — HYDROCODONE-ACETAMINOPHEN 10-325 MG PO TABS: 1.0000 | ORAL_TABLET | Freq: Two times a day (BID) | ORAL | 0 refills | Status: DC | PRN

## 2021-04-19 NOTE — Telephone Encounter (Signed)
PMP reviewed. Rx sent. Expect it to last about 2 months.

## 2021-04-25 ENCOUNTER — Telehealth: Payer: Self-pay | Admitting: Licensed Clinical Social Worker

## 2021-04-25 NOTE — Telephone Encounter (Signed)
Revealed negative BRCAPlus (STAT) testing. The remainder of testing is still pending and we will call her back once we have those results.

## 2021-04-28 ENCOUNTER — Ambulatory Visit: Payer: Self-pay | Admitting: Licensed Clinical Social Worker

## 2021-04-28 ENCOUNTER — Telehealth: Payer: Self-pay | Admitting: Licensed Clinical Social Worker

## 2021-04-28 ENCOUNTER — Encounter: Payer: Self-pay | Admitting: Licensed Clinical Social Worker

## 2021-04-28 DIAGNOSIS — Z1379 Encounter for other screening for genetic and chromosomal anomalies: Secondary | ICD-10-CM | POA: Insufficient documentation

## 2021-04-28 DIAGNOSIS — Z803 Family history of malignant neoplasm of breast: Secondary | ICD-10-CM

## 2021-04-28 DIAGNOSIS — D0512 Intraductal carcinoma in situ of left breast: Secondary | ICD-10-CM

## 2021-04-28 NOTE — Progress Notes (Signed)
HPI:  Carol Becker was previously seen in the [facility] clinic due to a personal and family history of cancer and concerns regarding a hereditary predisposition to cancer. Please refer to our prior cancer genetics clinic note for more information regarding our discussion, assessment and recommendations, at the time. Carol Becker recent genetic test results were disclosed to her, as were recommendations warranted by these results. These results and recommendations are discussed in more detail below.  CANCER HISTORY:  Oncology History Overview Note  DIAGNOSIS:  A. BREAST, LEFT LOWER OUTER QUADRANT; STEREOTACTIC CORE NEEDLE BIOPSY:  - DUCTAL CARCINOMA IN SITU, HIGH-GRADE, WITH COMEDONECROSIS AND  ASSOCIATED CALCIFICATIONS.  - NEGATIVE FOR INVASIVE CARCINOMA.  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 No pathogenic variants identified on the [facility] CancerNext-Expanded+RNA Panel. The report date is 04/27/2021.  The CancerNext-Expanded + RNAinsight gene panel offered by [employer] and includes sequencing and rearrangement analysis for the following 77 genes: IP, ALK, APC*, ATM*, AXIN2, BAP1, BARD1, BLM, BMPR1A, BRCA1*, BRCA2*, BRIP1*, CDC73, CDH1*,CDK4, CDKN1B, CDKN2A, CHEK2*, CTNNA1, DICER1, FANCC, FH, FLCN, GALNT12, KIF1B, LZTR1, MAX, MEN1, MET, MLH1*, MSH2*, MSH3, MSH6*, MUTYH*, NBN, NF1*, NF2, NTHL1, PALB2*, PHOX2B, PMS2*, POT1, PRKAR1A, PTCH1, PTEN*, RAD51C*, RAD51D*,RB1, RECQL, RET, SDHA, SDHAF2, SDHB, SDHC, SDHD, SMAD4, SMARCA4, SMARCB1, SMARCE1, STK11, SUFU, TMEM127, TP53*,TSC1, TSC2, VHL and XRCC2 (sequencing and deletion/duplication); EGFR, EGLN1, HOXB13, KIT, MITF, PDGFRA, POLD1 and POLE (sequencing only); EPCAM and GREM1 (deletion/duplication  only).     FAMILY HISTORY:  We obtained a detailed, 4-generation family history.  Significant diagnoses are listed below: Family History  Problem Relation Age of Onset   Heart disease Father        CAD   Heart disease Sister 45       CAD   Parkinson's disease Sister    Cancer Maternal Grandmother        breast   Breast cancer Maternal Grandmother 17   Alzheimer's disease Maternal Grandfather    Cancer Paternal Grandmother        breast   Breast cancer Paternal Grandmother 86   Bipolar disorder Daughter    Heart disease Cousin    Breast cancer Cousin        dx 30s-40s, d. 26s-40s    Carol Becker has 2 daughters (93 and 74). She has 1 sister (36). None have had cancer.   Carol Becker mother died at 12, no history of cancer. Patient had 2 maternal aunts and 1 uncle, no cancers for them or for first cousins. Maternal grandmother had breast cancer in her 61s and grandfather died in his 83s.   Carol Becker father died at 22, no history of cancer. Patient had 2 paternal uncles and 1 aunt. One of her uncles's daughters had breast cancer in her 30s-40s and died in her 7s-40s. Never had genetic testing. No other known cousins with cancer. Paternal grandmother had breast cancer in her 70s and it metastasized to her lung and she passed in her 51s. Grandfather passed in his 43s.   Carol Becker is unaware of previous family history of genetic testing for hereditary cancer risks. Patient's maternal ancestors are of Native American, unknown descent, and paternal ancestors are of [place] and [place] descent. There is no reported Ashkenazi Jewish ancestry. There is no known  consanguinity.     GENETIC TEST RESULTS: Genetic testing reported out on 04/27/2021 through the [facility] CancerNext-Expanded+RNA cancer panel found no pathogenic mutations.   The CancerNext-Expanded + RNAinsight gene panel offered by [employer] and includes sequencing and rearrangement analysis for the following 77 genes:  IP, ALK, APC*, ATM*, AXIN2, BAP1, BARD1, BLM, BMPR1A, BRCA1*, BRCA2*, BRIP1*, CDC73, CDH1*,CDK4, CDKN1B, CDKN2A, CHEK2*, CTNNA1, DICER1, FANCC, FH, FLCN, GALNT12, KIF1B, LZTR1, MAX, MEN1, MET, MLH1*, MSH2*, MSH3, MSH6*, MUTYH*, NBN, NF1*, NF2, NTHL1, PALB2*, PHOX2B, PMS2*, POT1, PRKAR1A, PTCH1, PTEN*, RAD51C*, RAD51D*,RB1, RECQL, RET, SDHA, SDHAF2, SDHB, SDHC, SDHD, SMAD4, SMARCA4, SMARCB1, SMARCE1, STK11, SUFU, TMEM127, TP53*,TSC1, TSC2, VHL and XRCC2 (sequencing and deletion/duplication); EGFR, EGLN1, HOXB13, KIT, MITF, PDGFRA, POLD1 and POLE (sequencing only); EPCAM and GREM1 (deletion/duplication only).   The test report has been scanned into EPIC and is located under the Molecular Pathology section of the Results Review tab.  A portion of the result report is included below for reference.    We discussed that because current genetic testing is not perfect, it is possible there may be a gene mutation in one of these genes that current testing cannot detect, but that chance is small.  There could be another gene that has not yet been discovered, or that we have not yet tested, that is responsible for the cancer diagnoses in the family. It is also possible there is a hereditary cause for the cancer in the family that Carol Becker did not inherit and therefore was not identified in her testing.  Therefore, it is important to remain in touch with cancer genetics in the future so that we can continue to offer Carol Becker the most up to date genetic testing.   ADDITIONAL GENETIC TESTING: We discussed with Carol Becker that her genetic testing was fairly extensive.  If there are genes identified to increase cancer risk that can be analyzed in the future, we would be happy to discuss and coordinate this testing at that time.    CANCER SCREENING RECOMMENDATIONS: Carol Becker test result is considered negative (normal).  This means that we have not identified a hereditary cause for her  personal and family history  of cancer at this time. Most cancers happen by chance and this negative test suggests that her cancer may fall into this category.    While reassuring, this does not definitively rule out a hereditary predisposition to cancer. It is still possible that there could be genetic mutations that are undetectable by current technology. There could be genetic mutations in genes that have not been tested or identified to increase cancer risk.  Therefore, it is recommended she continue to follow the cancer management and screening guidelines provided by her oncology and primary healthcare provider.   An individual's cancer risk and medical management are not determined by genetic test results alone. Overall cancer risk assessment incorporates additional factors, including personal medical history, family history, and any available genetic information that may result in a personalized plan for cancer prevention and surveillance.  RECOMMENDATIONS FOR FAMILY MEMBERS:  Relatives in this family might be at some increased risk of developing cancer, over the general population risk, simply due to the family history of cancer.  We recommended female relatives in this family have a yearly mammogram beginning at age 45, or 67 years younger than the earliest onset of cancer, an annual clinical breast exam, and perform monthly breast self-exams. Female relatives in this family should also have a gynecological exam as recommended by their primary  provider.  All family members should be referred for colonoscopy starting at age 59.    It is also possible there is a hereditary cause for the cancer in Carol Becker's family that she did not inherit and therefore was not identified in her.  Based on Carol Becker's family history, we recommended her paternal cousin who had breast cancer in her 30s-40s have genetic counseling and testing. Carol Becker will let us know if we can be of any assistance in coordinating genetic counseling and/or  testing for these family members.  FOLLOW-UP: Lastly, we discussed with Carol Becker that cancer genetics is a rapidly advancing field and it is possible that new genetic tests will be appropriate for her and/or her family members in the future. We encouraged her to remain in contact with cancer genetics on an annual basis so we can update her personal and family histories and let her know of advances in cancer genetics that may benefit this family.   Our contact number was provided. Ms. Tesar questions were answered to her satisfaction, and she knows she is welcome to call us at anytime with additional questions or concerns.   Faith Rogue, MS, [facility] Genetic Counselor [email or web address] Phone: [phone]

## 2021-04-28 NOTE — Telephone Encounter (Signed)
Revealed negative genetic testing. This normal result is reassuring and indicates that it is unlikely Carol Becker's cancer is due to a hereditary cause.  It is unlikely that there is an increased risk of another cancer due to a mutation in one of these genes.  However, genetic testing is not perfect, and cannot definitively rule out a hereditary cause.  It will be important for her to keep in contact with genetics to learn if any additional testing may be needed in the future.

## 2021-05-20 ENCOUNTER — Ambulatory Visit (INDEPENDENT_AMBULATORY_CARE_PROVIDER_SITE_OTHER): Payer: Managed Care, Other (non HMO) | Admitting: Family Medicine

## 2021-05-20 ENCOUNTER — Encounter: Payer: Self-pay | Admitting: Family Medicine

## 2021-05-20 ENCOUNTER — Other Ambulatory Visit: Payer: Self-pay

## 2021-05-20 VITALS — BP 117/73 | HR 93 | Ht 64.0 in | Wt 203.2 lb

## 2021-05-20 DIAGNOSIS — E119 Type 2 diabetes mellitus without complications: Secondary | ICD-10-CM | POA: Diagnosis not present

## 2021-05-20 DIAGNOSIS — G894 Chronic pain syndrome: Secondary | ICD-10-CM | POA: Diagnosis not present

## 2021-05-20 DIAGNOSIS — F40298 Other specified phobia: Secondary | ICD-10-CM | POA: Diagnosis not present

## 2021-05-20 DIAGNOSIS — K5900 Constipation, unspecified: Secondary | ICD-10-CM

## 2021-05-20 MED ORDER — POLYETHYLENE GLYCOL 3350 17 GM/SCOOP PO POWD: 17.0000 g | Freq: Two times a day (BID) | ORAL | 1 refills | Status: DC | PRN

## 2021-05-20 MED ORDER — HYDROCODONE-ACETAMINOPHEN 10-325 MG PO TABS: 1.0000 | ORAL_TABLET | Freq: Two times a day (BID) | ORAL | 0 refills | Status: AC | PRN

## 2021-05-20 MED ORDER — GABAPENTIN 600 MG PO TABS: 600.0000 mg | ORAL_TABLET | Freq: Every day | ORAL | 1 refills | Status: DC

## 2021-05-20 NOTE — Progress Notes (Signed)
BP 117/73   Pulse 93   Ht 5\' 4"  (1.626 m)   Wt 203 lb 3.2 oz (92.2 kg)   SpO2 97%   BMI 34.88 kg/m    Subjective:    Patient ID: Carol Becker, female    DOB: 11/10/1960, 60 y.o.   MRN: [id number]  HPI: Carol Becker is a 60 y.o. female  Chief Complaint  Patient presents with   Diabetes   Obesity   Hyperlipidemia   DIABETES Hypoglycemic episodes:no Polydipsia/polyuria: no Visual disturbance: no Chest pain: no Paresthesias: no Glucose Monitoring: no  Accucheck frequency: Not Checking Taking Insulin?: no Blood Pressure Monitoring: not checking Retinal Examination: Up to Date Foot Exam: Up to Date Diabetic Education: Completed Pneumovax: Up to Date Influenza:  will get elsewhere Aspirin: no  Phonophobia is getting worse. She feels like it is acting up a lot. Would like sedative for it. Not seeing counselor  CHRONIC PAIN  Present dose: 40 Morphine equivalents Pain control status: better Duration: chronic Location: back Quality: aching and sore Current Pain Level: moderate Previous Pain Level: severe Breakthrough pain: yes Benefit from narcotic medications: yes What Activities task can be accomplished with current medication? Able to do her ADLs Interested in weaning off narcotics:yes   Stool softners/OTC fiber: yes  Previous pain specialty evaluation: yes Non-narcotic analgesic meds: yes Narcotic contract: yes   Relevant past medical, surgical, family and social history reviewed and updated as indicated. Interim medical history since our last visit reviewed. Allergies and medications reviewed and updated.  Review of Systems  Constitutional: Negative.   HENT: Negative.    Respiratory: Negative.    Cardiovascular: Negative.   Gastrointestinal:  Positive for constipation. Negative for abdominal distention, abdominal pain, anal bleeding, blood in stool, diarrhea, nausea, rectal pain and vomiting.  Musculoskeletal: Negative.   Neurological:  Negative.   Psychiatric/Behavioral: Negative.     Per HPI unless specifically indicated above     Objective:    BP 117/73   Pulse 93   Ht 5\' 4"  (1.626 m)   Wt 203 lb 3.2 oz (92.2 kg)   SpO2 97%   BMI 34.88 kg/m   Wt Readings from Last 3 Encounters:  05/20/21 203 lb 3.2 oz (92.2 kg)  04/08/21 202 lb (91.6 kg)  03/23/21 212 lb 8 oz (96.4 kg)    Physical Exam Vitals and nursing note reviewed.  Constitutional:      General: She is not in acute distress.    Appearance: Normal appearance. She is not ill-appearing, toxic-appearing or diaphoretic.  HENT:     Head: Normocephalic and atraumatic.     Right Ear: External ear normal.     Left Ear: External ear normal.     Nose: Nose normal.     Mouth/Throat:     Mouth: Mucous membranes are moist.     Pharynx: Oropharynx is clear.  Eyes:     General: No scleral icterus.       Right eye: No discharge.        Left eye: No discharge.     Extraocular Movements: Extraocular movements intact.     Conjunctiva/sclera: Conjunctivae normal.     Pupils: Pupils are equal, round, and reactive to light.  Cardiovascular:     Rate and Rhythm: Normal rate and regular rhythm.     Pulses: Normal pulses.     Heart sounds: Normal heart sounds. No murmur heard.   No friction rub. No gallop.  Pulmonary:     Effort:  Pulmonary effort is normal. No respiratory distress.     Breath sounds: Normal breath sounds. No stridor. No wheezing, rhonchi or rales.  Chest:     Chest wall: No tenderness.  Musculoskeletal:        General: Normal range of motion.     Cervical back: Normal range of motion and neck supple.  Skin:    General: Skin is warm and dry.     Capillary Refill: Capillary refill takes less than 2 seconds.     Coloration: Skin is not jaundiced or pale.     Findings: No bruising, erythema, lesion or rash.  Neurological:     General: No focal deficit present.     Mental Status: She is alert and oriented to person, place, and time. Mental status  is at baseline.  Psychiatric:        Mood and Affect: Mood normal.        Behavior: Behavior normal.        Thought Content: Thought content normal.        Judgment: Judgment normal.    Results for orders placed or performed during the hospital encounter of 03/15/21  Surgical pathology  Result Value Ref Range   SURGICAL PATHOLOGY      SURGICAL PATHOLOGY CASE: [id number] PATIENT: [facility] Surgical Pathology Report     Specimen Submitted: A. Breast, left lower outer  Clinical History: New grouped calcifications, rule out DCIS. Ribbon-shaped clip placed following stereotactic biopsy of LEFT breast, lower outer quadrant.    DIAGNOSIS: A. BREAST, LEFT LOWER OUTER QUADRANT; STEREOTACTIC CORE NEEDLE BIOPSY: - DUCTAL CARCINOMA IN SITU, HIGH-GRADE, WITH COMEDONECROSIS AND ASSOCIATED CALCIFICATIONS. - NEGATIVE FOR INVASIVE CARCINOMA.  Comment: Intact DCIS is identified in 5 out of 6 sampled tissue blocks, measuring up to 9 mm in greatest linear extent. Immunohistochemical studies directed against myoepithelial markers were performed to better clarify a focal area initially thought suspicious for microinvasion. These stains demonstrate an intact myoepithelial layer in this region, indicating carcinoma is still in situ.  Although the specimen is received with diagram indicating tissue pr esent in section A, no tissue is identified upon opening the specimen container. This is reviewed together with concurrent imaging studies.  Ancillary hormone receptor testing will be deferred to the final excision.  IHC slides were prepared by Launa Grill, [place]. All controls stained appropriately.  This test was developed and its performance characteristics determined by [facility]. It has not been cleared or approved by the [place] Food and Drug Administration. The FDA does not require this test to go through premarket FDA review. This test is used for clinical purposes. It  should not be regarded as investigational or for research. This laboratory is certified under the Clinical Laboratory Improvement Amendments (CLIA) as qualified to perform high complexity clinical laboratory testing.  GROSS DESCRIPTION: A. Labeled: Left breast stereo biopsy calcs lower outer Received: in a formalin-filled [place] collection device Specimen radiograph image(s) available for review  Time/Date in fixative: Collected at 9:11 AM on 03/15/2021 and placed in formalin at 9:15 AM on 03/15/2021 Cold ischemic time: Less than 5 minutes Total fixation time: Approximately 8 hours Core pieces: Multiple Measurement: Aggregate, 7.7 x 1.4 x 0.5 cm Description / comments: Received are cores and fragments of yellow fibrofatty tissue.  The accompanying diagram has sections A, B, C, D, E, and F checked.  No distinct tissue is grossly identified in section A. Inked: Green Entirely submitted in cassette(s):  1 - section B 2 - section C  3 - section D 4 - section E 5 - section F 6 - remaining tissue from sections G, H, I and J   RB 03/15/2021  Final Diagnosis performed by Allena Napoleon, MD.   Electronically signed 03/17/2021 9:12:28AM The electronic signature indicates that the named Attending Pathologist has evaluated the specimen Technical component performed at [facility], [address], [place], [place] [address] Lab: [phone] Dir: Rush Farmer, MD, MMM  Professio nal component performed at [facility], [facility], [place], [place], [place] [address] Lab: [phone] Dir: Dellia Nims. Reuel Derby, MD       Assessment & Plan:   Problem List Items Addressed This Visit       Endocrine   Diabetes mellitus without complication ([place]) - Primary    Doing well. Victoza also helping with weight loss. Continue current regimen. Continue to monitor. Call with any concerns.         Other   Morbid obesity ([place])    Down 10lbs on the victoza. Continue to monitor. Call with  any concerns.       Chronic pain    Under good control on current regimen. Continue current regimen. Continue to monitor. Call with any concerns. Refills given for 3 months. Follow up 3 months.        Relevant Medications   diclofenac (VOLTAREN) 75 MG EC tablet   HYDROcodone-acetaminophen (NORCO/VICODIN) 5-325 MG tablet   gabapentin (NEURONTIN) 600 MG tablet   HYDROcodone-acetaminophen (NORCO) 10-325 MG tablet   HYDROcodone-acetaminophen (NORCO) 10-325 MG tablet (Start on 07/19/2021)   HYDROcodone-acetaminophen (NORCO) 10-325 MG tablet (Start on 06/19/2021)   Phonophobia    Discussed referral to CBT. She is not interested in this at this time and states that she will discuss with her neurologist. I did discuss that since she is on pain medicine I cannot prescribe benzos concurrently with that.       Other Visit Diagnoses     Constipation, unspecified constipation type       Will treat with miralax. If not impoving, consider movantik.        Follow up plan: Return in about 3 months (around 08/20/2021).

## 2021-05-23 ENCOUNTER — Encounter: Payer: Self-pay | Admitting: Family Medicine

## 2021-05-23 NOTE — Assessment & Plan Note (Signed)
Under good control on current regimen. Continue current regimen. Continue to monitor. Call with any concerns. Refills given for 3 months. Follow up 3 months.    

## 2021-05-23 NOTE — Assessment & Plan Note (Signed)
Down 10lbs on the victoza. Continue to monitor. Call with any concerns.

## 2021-05-23 NOTE — Assessment & Plan Note (Signed)
Doing well. Victoza also helping with weight loss. Continue current regimen. Continue to monitor. Call with any concerns.

## 2021-05-23 NOTE — Assessment & Plan Note (Signed)
Discussed referral to CBT. She is not interested in this at this time and states that she will discuss with her neurologist. I did discuss that since she is on pain medicine I cannot prescribe benzos concurrently with that.

## 2021-05-30 ENCOUNTER — Encounter: Payer: Self-pay | Admitting: Family Medicine

## 2021-05-30 ENCOUNTER — Other Ambulatory Visit: Payer: Self-pay | Admitting: Family Medicine

## 2021-05-30 NOTE — Telephone Encounter (Signed)
Requested medication (s) are due for refill today: Yes  Requested medication (s) are on the active medication list: Yes  Last refill:  02/14/21  Future visit scheduled: No  Notes to clinic:  Routing to the office due to multiple instructions in the sig     Requested Prescriptions  Pending Prescriptions Disp Refills   [place] 18 MG/3ML SOPN [Pharmacy Med Name: VICTOZA 18MG /3ML INJ PEN 3ML(2PACK)] 6 mL     Sig: START 0.6MG  SUBCUTANEOUS ONCE DAILY FOR 7 DAYS THEN INCREASE TO 1.2MG  ONCE A DAY     Endocrinology:  Diabetes - GLP-1 Receptor Agonists Passed - 05/30/2021  8:33 AM      Passed - HBA1C is between 0 and 7.9 and within 180 days    HB A1C (BAYER DCA - WAIVED)  Date Value Ref Range Status  02/11/2021 6.3 <7.0 % Final    Comment:                                          Diabetic Adult            <7.0                                       Healthy Adult        4.3 - 5.7                                                           (DCCT/NGSP) American Diabetes Association's Summary of Glycemic Recommendations for Adults with Diabetes: Hemoglobin A1c <7.0%. More stringent glycemic goals (A1c <6.0%) may further reduce complications at the cost of increased risk of hypoglycemia.           Passed - Valid encounter within last 6 months    Recent Outpatient Visits           1 week ago Diabetes mellitus without complication [facility])   [place], [place], DO   1 month ago COVID-19   [facility], Megan P, DO   3 months ago Diabetes mellitus without complication [facility])   [place], Megan P, DO   5 months ago Bipolar 2 disorder [facility])   [place], Megan P, DO   6 months ago Diabetes mellitus without complication [facility])   [place], Megan P, DO

## 2021-05-30 NOTE — Telephone Encounter (Signed)
Can we please check on Rx- she should have a refill waiting for her based on what I see

## 2021-06-01 NOTE — Telephone Encounter (Signed)
Can we confirm that patient got her victoza?

## 2021-06-27 ENCOUNTER — Other Ambulatory Visit: Payer: Self-pay | Admitting: Family Medicine

## 2021-06-27 MED ORDER — VICTOZA 18 MG/3ML ~~LOC~~ SOPN: 1.8000 mg | PEN_INJECTOR | Freq: Every day | SUBCUTANEOUS | 1 refills | Status: DC

## 2021-06-27 MED ORDER — MELOXICAM 15 MG PO TABS: 15.0000 mg | ORAL_TABLET | Freq: Every day | ORAL | 1 refills | Status: DC

## 2021-06-28 NOTE — Telephone Encounter (Signed)
Medication had already been refill on 06/27/2021 by provider.

## 2021-08-15 ENCOUNTER — Encounter: Payer: Self-pay | Admitting: Family Medicine

## 2021-08-15 MED ORDER — GABAPENTIN 600 MG PO TABS: 600.0000 mg | ORAL_TABLET | Freq: Two times a day (BID) | ORAL | 1 refills | Status: DC

## 2021-09-19 ENCOUNTER — Encounter: Payer: Self-pay | Admitting: Family Medicine

## 2021-09-19 NOTE — Telephone Encounter (Signed)
Will leave for Dr. Wynetta Emery review:) ?

## 2021-10-19 ENCOUNTER — Other Ambulatory Visit: Payer: Self-pay | Admitting: Family Medicine

## 2021-10-20 NOTE — Telephone Encounter (Signed)
Requested Prescriptions  ?Pending Prescriptions Disp Refills  ?? busPIRone (BUSPAR) 15 MG tablet [Pharmacy Med Name: BUSPIRONE 15MG  TABLETS] 270 tablet 1  ?  Sig: TAKE 1 TO 2 TABLETS BY MOUTH THREE TIMES DAILY  ?  ? Psychiatry: Anxiolytics/Hypnotics - Non-controlled Passed - 10/19/2021 10:09 PM  ?  ?  Passed - Valid encounter within last 12 months  ?  Recent Outpatient Visits   ?      ? 5 months ago Diabetes mellitus without complication ([place])  ? [place], Megan P, DO  ? 6 months ago COVID-19  ? [place] P, DO  ? 8 months ago Diabetes mellitus without complication ([place])  ? [place] P, DO  ? 10 months ago Bipolar 2 disorder ([place])  ? [place] P, DO  ? 11 months ago Diabetes mellitus without complication ([place])  ? [place], [place] P, DO  ?  ?  ? ?  ?  ?  ? ?

## 2021-11-05 ENCOUNTER — Other Ambulatory Visit: Payer: Self-pay | Admitting: Family Medicine

## 2021-11-06 ENCOUNTER — Other Ambulatory Visit: Payer: Self-pay | Admitting: Family Medicine

## 2021-11-07 NOTE — Telephone Encounter (Signed)
Requested medications are due for refill today.  unsure ? ?Requested medications are on the active medications list.  yes ? ?Last refill. 06/27/2021 27 mL 1 refill ? ?Future visit scheduled.   no ? ?Notes to clinic.  Rx written to expire 09/25/2021 - Rx is expired. ? ? ? ?Requested Prescriptions  ?Pending Prescriptions Disp Refills  ? VICTOZA 18 MG/3ML SOPN [Pharmacy Med Name: VICTOZA 18MG /3ML INJ PEN 3ML(2PACK)] 6 mL   ?  Sig: INJECT 0.6MG  UNDER THE SKIN ONCE DAILY FOR 7 DAYS TO START, THEN INCREASE TO 1.2MG  ONCE A DAY  ?  ? Endocrinology:  Diabetes - GLP-1 Receptor Agonists Failed - 11/05/2021 12:51 PM  ?  ?  Failed - HBA1C is between 0 and 7.9 and within 180 days  ?  HB A1C (BAYER DCA - WAIVED)  ?Date Value Ref Range Status  ?02/11/2021 6.3 <7.0 % Final  ?  Comment:  ?                                        Diabetic Adult            <7.0 ?                                      Healthy Adult        4.3 - 5.7 ?                                                          (DCCT/NGSP) ?American Diabetes Association's Summary of Glycemic Recommendations ?for Adults with Diabetes: Hemoglobin A1c <7.0%. More stringent ?glycemic goals (A1c <6.0%) may further reduce complications at the ?cost of increased risk of hypoglycemia. ?  ?  ?  ?  ?  Passed - Valid encounter within last 6 months  ?  Recent Outpatient Visits   ? ?      ? 5 months ago Diabetes mellitus without complication ([place])  ? [place], Megan P, DO  ? 7 months ago COVID-19  ? [place] P, DO  ? 8 months ago Diabetes mellitus without complication ([place])  ? [place] P, DO  ? 10 months ago Bipolar 2 disorder ([place])  ? [place] P, DO  ? 12 months ago Diabetes mellitus without complication ([place])  ? [place], [place] P, DO  ? ?  ?  ? ? ?  ?  ?  ?  ?

## 2021-11-07 NOTE — Telephone Encounter (Signed)
Requested medication (s) are due for refill today: expired medication ? ?Requested medication (s) are on the active medication list: yes ? ?Last refill:  06/27/21- 09/25/21 #27 1 refill ? ?Future visit scheduled: no ? ?Notes to clinic:  expired medication, tapered drug. See sig. Do you want to renew Rx? ? ? ?  ?Requested Prescriptions  ?Pending Prescriptions Disp Refills  ? VICTOZA 18 MG/3ML SOPN [Pharmacy Med Name: VICTOZA 18MG /3ML INJ PEN 3ML(2PACK)] 6 mL   ?  Sig: INJECT 0.6MG  UNDER THE SKIN ONCE DAILY FOR 7 DAYS TO START, THEN INCREASE TO 1.2MG  ONCE A DAY  ?  ? Endocrinology:  Diabetes - GLP-1 Receptor Agonists Failed - 11/06/2021 10:25 AM  ?  ?  Failed - HBA1C is between 0 and 7.9 and within 180 days  ?  HB A1C (BAYER DCA - WAIVED)  ?Date Value Ref Range Status  ?02/11/2021 6.3 <7.0 % Final  ?  Comment:  ?                                        Diabetic Adult            <7.0 ?                                      Healthy Adult        4.3 - 5.7 ?                                                          (DCCT/NGSP) ?American Diabetes Association's Summary of Glycemic Recommendations ?for Adults with Diabetes: Hemoglobin A1c <7.0%. More stringent ?glycemic goals (A1c <6.0%) may further reduce complications at the ?cost of increased risk of hypoglycemia. ?  ?  ?  ?  ?  Passed - Valid encounter within last 6 months  ?  Recent Outpatient Visits   ? ?      ? 5 months ago Diabetes mellitus without complication ([place])  ? [place], Megan P, DO  ? 7 months ago COVID-19  ? [place] P, DO  ? 8 months ago Diabetes mellitus without complication ([place])  ? [place] P, DO  ? 10 months ago Bipolar 2 disorder ([place])  ? [place] P, DO  ? 12 months ago Diabetes mellitus without complication ([place])  ? [place], [place] P, DO  ? ?  ?  ? ? ?  ?  ?  ? ?

## 2021-11-08 ENCOUNTER — Other Ambulatory Visit: Payer: Self-pay | Admitting: Family Medicine

## 2021-11-08 MED ORDER — VICTOZA 18 MG/3ML ~~LOC~~ SOPN: 1.8000 mg | PEN_INJECTOR | Freq: Every day | SUBCUTANEOUS | 1 refills | Status: DC

## 2021-11-15 ENCOUNTER — Other Ambulatory Visit: Payer: Self-pay | Admitting: Family Medicine

## 2021-11-16 NOTE — Telephone Encounter (Signed)
Refused the Buspar 7.5 mg refill request because it was discontinued on 8/5.2022 by Dr. Wynetta Emery due to a dose change.   ?Gabapentin 600 mg refused because being requested too early.  08/15/2021 #180, 1 refill was given. ?

## 2021-11-18 ENCOUNTER — Encounter: Payer: Self-pay | Admitting: Family Medicine

## 2021-11-18 ENCOUNTER — Ambulatory Visit (INDEPENDENT_AMBULATORY_CARE_PROVIDER_SITE_OTHER): Payer: Managed Care, Other (non HMO) | Admitting: Family Medicine

## 2021-11-18 VITALS — BP 130/81 | HR 97 | Temp 98.1°F | Wt 206.6 lb

## 2021-11-18 DIAGNOSIS — G2581 Restless legs syndrome: Secondary | ICD-10-CM

## 2021-11-18 DIAGNOSIS — E1149 Type 2 diabetes mellitus with other diabetic neurological complication: Secondary | ICD-10-CM

## 2021-11-18 DIAGNOSIS — E782 Mixed hyperlipidemia: Secondary | ICD-10-CM | POA: Diagnosis not present

## 2021-11-18 DIAGNOSIS — E0590 Thyrotoxicosis, unspecified without thyrotoxic crisis or storm: Secondary | ICD-10-CM | POA: Diagnosis not present

## 2021-11-18 DIAGNOSIS — F411 Generalized anxiety disorder: Secondary | ICD-10-CM

## 2021-11-18 LAB — URINALYSIS, ROUTINE W REFLEX MICROSCOPIC
Bilirubin, UA: NEGATIVE
Glucose, UA: NEGATIVE
Ketones, UA: NEGATIVE
Nitrite, UA: NEGATIVE
Protein,UA: NEGATIVE
RBC, UA: NEGATIVE
Specific Gravity, UA: 1.01 (ref 1.005–1.030)
Urobilinogen, Ur: 0.2 mg/dL (ref 0.2–1.0)
pH, UA: 6 (ref 5.0–7.5)

## 2021-11-18 LAB — MICROALBUMIN, URINE WAIVED
Creatinine, Urine Waived: 50 mg/dL (ref 10–300)
Microalb, Ur Waived: 10 mg/L (ref 0–19)
Microalb/Creat Ratio: <30 mg/g (ref <30)

## 2021-11-18 LAB — MICROSCOPIC EXAMINATION
Bacteria, UA: NONE SEEN
RBC, Urine: NONE SEEN /hpf (ref 0–2)

## 2021-11-18 LAB — BAYER DCA HB A1C WAIVED: HB A1C (BAYER DCA - WAIVED): 5.9 % — ABNORMAL HIGH (ref 4.8–5.6)

## 2021-11-18 MED ORDER — MELOXICAM 15 MG PO TABS: 15.0000 mg | ORAL_TABLET | Freq: Every day | ORAL | 1 refills | Status: DC

## 2021-11-18 MED ORDER — BUSPIRONE HCL 15 MG PO TABS: ORAL_TABLET | ORAL | 0 refills | Status: DC

## 2021-11-18 MED ORDER — OZEMPIC (0.25 OR 0.5 MG/DOSE) 2 MG/3ML ~~LOC~~ SOPN
0.5000 mg | PEN_INJECTOR | SUBCUTANEOUS | 1 refills | Status: DC
Start: 2021-11-18 — End: 2022-02-24

## 2021-11-18 MED ORDER — GABAPENTIN 800 MG PO TABS: 800.0000 mg | ORAL_TABLET | Freq: Two times a day (BID) | ORAL | 0 refills | Status: DC

## 2021-11-18 NOTE — Progress Notes (Signed)
? ?BP 130/81   Pulse 97   Temp 98.1 ?F (36.7 ?C)   Wt 206 lb 9.6 oz (93.7 kg)   SpO2 98%   BMI 35.46 kg/m?   ? ?Subjective:  ? ? Patient ID: Carol Becker, female    DOB: 01/04/1961, 61 y.o.   MRN: [id number] ? ?HPI: ?Carol Becker is a 61 y.o. female ? ?Chief Complaint  ?Patient presents with  ? Diabetes  ?  Patient has not had eye exam since 2022  ? RLS  ?  Patient states her RLS is getting worse  ? Hyperlipidemia  ? ?DIABETES ?Hypoglycemic episodes:no ?Polydipsia/polyuria: no ?Visual disturbance: no ?Chest pain: no ?Paresthesias: no ?Glucose Monitoring: no ? Accucheck frequency: Not Checking ?Taking Insulin?: no ?Blood Pressure Monitoring: not checking ?Retinal Examination: Not up to Date ?Foot Exam: Not up to Date ?Diabetic Education: Completed ?Pneumovax: Up to Date ?Influenza: Up to Date ?Aspirin: no ? ?BIPOLAR ?Mood status: controlled ?Satisfied with current treatment?: yes ?Symptom severity: mild  ?Duration of current treatment : chronic ?Side effects: no ?Medication compliance: excellent compliance ?Psychotherapy/counseling: no  ?Previous psychiatric medications: effexor ?Depressed mood: no ?Anxious mood: no ?Anhedonia: no ?Significant weight loss or gain: no ?Insomnia: no  ?Fatigue: no ?Feelings of worthlessness or guilt: no ?Impaired concentration/indecisiveness: no ?Suicidal ideations: no ?Hopelessness: no ?Crying spells: no ? ?  11/18/2021  ? 10:08 AM 05/20/2021  ?  8:15 AM 02/11/2021  ?  3:01 PM 12/23/2020  ? 11:26 AM 05/14/2020  ? 10:15 AM  ?Depression screen PHQ 2/9  ?Decreased Interest 0 0 1 2 0  ?Down, Depressed, Hopeless 0 0 1 1 0  ?PHQ - 2 Score 0 0 2 3 0  ?Altered sleeping 0 0 3 3 0  ?Tired, decreased energy 1 2 1 0 0  ?Change in appetite 0 0 0 0 0  ?Feeling bad or failure about yourself  0 0 0 0 0  ?Trouble concentrating 0 0 0 3 0  ?Moving slowly or fidgety/restless 0 0 0 0 0  ?Suicidal thoughts 0 0 0 0 0  ?PHQ-9 Score 1 2 6 9 0  ?Difficult doing work/chores Not difficult at all  Not  difficult at all Somewhat difficult Not difficult at all  ? ?HYPERTHYROIDISM ?Thyroid control status:controlled ?Satisfied with current treatment? yes ?Medication side effects: no ?Medication compliance: excellent compliance ?Recent dose adjustment:yes ?Fatigue: yes ?Cold intolerance: no ?Heat intolerance: no ?Weight gain: no ?Weight loss: no ?Constipation: no ?Diarrhea/loose stools: no ?Palpitations: no ?Lower extremity edema: no ?Anxiety/depressed mood: no ? ?Relevant past medical, surgical, family and social history reviewed and updated as indicated. Interim medical history since our last visit reviewed. ?Allergies and medications reviewed and updated. ? ?Review of Systems  ?Constitutional: Negative.   ?Respiratory: Negative.    ?Cardiovascular: Negative.   ?Gastrointestinal: Negative.   ?Musculoskeletal: Negative.   ?Psychiatric/Behavioral: Negative.    ? ?Per HPI unless specifically indicated above ? ?   ?Objective:  ?  ?BP 130/81   Pulse 97   Temp 98.1 ?F (36.7 ?C)   Wt 206 lb 9.6 oz (93.7 kg)   SpO2 98%   BMI 35.46 kg/m?   ?Wt Readings from Last 3 Encounters:  ?11/18/21 206 lb 9.6 oz (93.7 kg)  ?05/20/21 203 lb 3.2 oz (92.2 kg)  ?04/08/21 202 lb (91.6 kg)  ?  ?Physical Exam ?Vitals and nursing note reviewed.  ?Constitutional:   ?   General: She is not in acute distress. ?   Appearance: Normal appearance. She   is not ill-appearing, toxic-appearing or diaphoretic.  ?HENT:  ?   Head: Normocephalic and atraumatic.  ?   Right Ear: External ear normal.  ?   Left Ear: External ear normal.  ?   Nose: Nose normal.  ?   Mouth/Throat:  ?   Mouth: Mucous membranes are moist.  ?   Pharynx: Oropharynx is clear.  ?Eyes:  ?   General: No scleral icterus.    ?   Right eye: No discharge.     ?   Left eye: No discharge.  ?   Extraocular Movements: Extraocular movements intact.  ?   Conjunctiva/sclera: Conjunctivae normal.  ?   Pupils: Pupils are equal, round, and reactive to light.  ?Cardiovascular:  ?   Rate and Rhythm:  Normal rate and regular rhythm.  ?   Pulses: Normal pulses.  ?   Heart sounds: Normal heart sounds. No murmur heard. ?  No friction rub. No gallop.  ?Pulmonary:  ?   Effort: Pulmonary effort is normal. No respiratory distress.  ?   Breath sounds: Normal breath sounds. No stridor. No wheezing, rhonchi or rales.  ?Chest:  ?   Chest wall: No tenderness.  ?Musculoskeletal:     ?   General: Normal range of motion.  ?   Cervical back: Normal range of motion and neck supple.  ?Skin: ?   General: Skin is warm and dry.  ?   Capillary Refill: Capillary refill takes less than 2 seconds.  ?   Coloration: Skin is not jaundiced or pale.  ?   Findings: No bruising, erythema, lesion or rash.  ?Neurological:  ?   General: No focal deficit present.  ?   Mental Status: She is alert and oriented to person, place, and time. Mental status is at baseline.  ?Psychiatric:     ?   Mood and Affect: Mood normal.     ?   Behavior: Behavior normal.     ?   Thought Content: Thought content normal.     ?   Judgment: Judgment normal.  ? ? ?Results for orders placed or performed in visit on 11/18/21  ?Microscopic Examination  ? BLD  ?Result Value Ref Range  ? WBC, UA 0-5 0 - 5 /hpf  ? RBC None seen 0 - 2 /hpf  ? Epithelial Cells (non renal) 0-10 0 - 10 /hpf  ? Bacteria, UA None seen None seen/Few  ?Bayer DCA Hb A1c Waived  ?Result Value Ref Range  ? HB A1C (BAYER DCA - WAIVED) 5.9 (H) 4.8 - 5.6 %  ?CBC with Differential/Platelet  ?Result Value Ref Range  ? WBC 6.1 3.4 - 10.8 x10E3/uL  ? RBC 4.48 3.77 - 5.28 x10E6/uL  ? Hemoglobin 12.6 11.1 - 15.9 g/dL  ? Hematocrit 38.3 34.0 - 46.6 %  ? MCV 86 79 - 97 fL  ? MCH 28.1 26.6 - 33.0 pg  ? MCHC 32.9 31.5 - 35.7 g/dL  ? RDW 12.8 11.7 - 15.4 %  ? Platelets 311 150 - 450 x10E3/uL  ? Neutrophils 65 Not Estab. %  ? Lymphs 23 Not Estab. %  ? Monocytes 9 Not Estab. %  ? Eos 2 Not Estab. %  ? Basos 1 Not Estab. %  ? Neutrophils Absolute 4.0 1.4 - 7.0 x10E3/uL  ? Lymphocytes Absolute 1.4 0.7 - 3.1 x10E3/uL  ?  Monocytes Absolute 0.5 0.1 - 0.9 x10E3/uL  ? EOS (ABSOLUTE) 0.1 0.0 - 0.4 x10E3/uL  ? Basophils Absolute 0.0 0.0 -  0.2 x10E3/uL  ? Immature Granulocytes 0 Not Estab. %  ? Immature Grans (Abs) 0.0 0.0 - 0.1 x10E3/uL  ?Comprehensive metabolic panel  ?Result Value Ref Range  ? Glucose 117 (H) 70 - 99 mg/dL  ? BUN 13 6 - 24 mg/dL  ? Creatinine, Ser 0.86 0.57 - 1.00 mg/dL  ? eGFR 78 >59 mL/min/1.73  ? BUN/Creatinine Ratio 15 9 - 23  ? Sodium 141 134 - 144 mmol/L  ? Potassium 4.4 3.5 - 5.2 mmol/L  ? Chloride 104 96 - 106 mmol/L  ? CO2 23 20 - 29 mmol/L  ? Calcium 9.5 8.7 - 10.2 mg/dL  ? Total Protein 7.1 6.0 - 8.5 g/dL  ? Albumin 4.4 3.8 - 4.9 g/dL  ? Globulin, Total 2.7 1.5 - 4.5 g/dL  ? Albumin/Globulin Ratio 1.6 1.2 - 2.2  ? Bilirubin Total 0.3 0.0 - 1.2 mg/dL  ? Alkaline Phosphatase 109 44 - 121 IU/L  ? AST 26 0 - 40 IU/L  ? ALT 29 0 - 32 IU/L  ?Lipid Panel w/o Chol/HDL Ratio  ?Result Value Ref Range  ? Cholesterol, Total 198 100 - 199 mg/dL  ? Triglycerides 107 0 - 149 mg/dL  ? HDL 53 >39 mg/dL  ? VLDL Cholesterol Cal 19 5 - 40 mg/dL  ? LDL Chol Calc (NIH) 126 (H) 0 - 99 mg/dL  ?Urinalysis, Routine w reflex microscopic  ?Result Value Ref Range  ? Specific Gravity, UA 1.010 1.005 - 1.030  ? pH, UA 6.0 5.0 - 7.5  ? Color, UA Yellow Yellow  ? Appearance Ur Clear Clear  ? Leukocytes,UA 1+ (A) Negative  ? Protein,UA Negative Negative/Trace  ? Glucose, UA Negative Negative  ? Ketones, UA Negative Negative  ? RBC, UA Negative Negative  ? Bilirubin, UA Negative Negative  ? Urobilinogen, Ur 0.2 0.2 - 1.0 mg/dL  ? Nitrite, UA Negative Negative  ? Microscopic Examination See below:   ?Microalbumin, Urine Waived  ?Result Value Ref Range  ? Microalb, Ur Waived 10 0 - 19 mg/L  ? Creatinine, Urine Waived 50 10 - 300 mg/dL  ? Microalb/Creat Ratio <30 <30 mg/g  ? ?   ?Assessment & Plan:  ? ?Problem List Items Addressed This Visit   ? ?  ? Endocrine  ? Hyperthyroidism  ?  Continue to follow with endocrinology. Call with any concerns.  Continue to monitor.  ? ?  ?  ? Relevant Orders  ? CBC with Differential/Platelet (Completed)  ? Comprehensive metabolic panel (Completed)  ? Type 2 diabetes mellitus with neurological complications ([place]) - Primary  ?

## 2021-11-19 LAB — CBC WITH DIFFERENTIAL/PLATELET
Basophils Absolute: 0 10*3/uL (ref 0.0–0.2)
Basos: 1 %
EOS (ABSOLUTE): 0.1 10*3/uL (ref 0.0–0.4)
Eos: 2 %
Hematocrit: 38.3 % (ref 34.0–46.6)
Hemoglobin: 12.6 g/dL (ref 11.1–15.9)
Immature Grans (Abs): 0 10*3/uL (ref 0.0–0.1)
Immature Granulocytes: 0 %
Lymphocytes Absolute: 1.4 10*3/uL (ref 0.7–3.1)
Lymphs: 23 %
MCH: 28.1 pg (ref 26.6–33.0)
MCHC: 32.9 g/dL (ref 31.5–35.7)
MCV: 86 fL (ref 79–97)
Monocytes Absolute: 0.5 10*3/uL (ref 0.1–0.9)
Monocytes: 9 %
Neutrophils Absolute: 4 10*3/uL (ref 1.4–7.0)
Neutrophils: 65 %
Platelets: 311 10*3/uL (ref 150–450)
RBC: 4.48 x10E6/uL (ref 3.77–5.28)
RDW: 12.8 % (ref 11.7–15.4)
WBC: 6.1 10*3/uL (ref 3.4–10.8)

## 2021-11-19 LAB — COMPREHENSIVE METABOLIC PANEL
ALT: 29 IU/L (ref 0–32)
AST: 26 IU/L (ref 0–40)
Albumin/Globulin Ratio: 1.6 (ref 1.2–2.2)
Albumin: 4.4 g/dL (ref 3.8–4.9)
Alkaline Phosphatase: 109 IU/L (ref 44–121)
BUN/Creatinine Ratio: 15 (ref 9–23)
BUN: 13 mg/dL (ref 6–24)
Bilirubin Total: 0.3 mg/dL (ref 0.0–1.2)
CO2: 23 mmol/L (ref 20–29)
Calcium: 9.5 mg/dL (ref 8.7–10.2)
Chloride: 104 mmol/L (ref 96–106)
Creatinine, Ser: 0.86 mg/dL (ref 0.57–1.00)
Globulin, Total: 2.7 g/dL (ref 1.5–4.5)
Glucose: 117 mg/dL — ABNORMAL HIGH (ref 70–99)
Potassium: 4.4 mmol/L (ref 3.5–5.2)
Sodium: 141 mmol/L (ref 134–144)
Total Protein: 7.1 g/dL (ref 6.0–8.5)
eGFR: 78 mL/min/{1.73_m2} (ref >59)

## 2021-11-19 LAB — LIPID PANEL W/O CHOL/HDL RATIO
Cholesterol, Total: 198 mg/dL (ref 100–199)
HDL: 53 mg/dL (ref >39)
LDL Chol Calc (NIH): 126 mg/dL — ABNORMAL HIGH (ref 0–99)
Triglycerides: 107 mg/dL (ref 0–149)
VLDL Cholesterol Cal: 19 mg/dL (ref 5–40)

## 2021-11-21 ENCOUNTER — Encounter: Payer: Self-pay | Admitting: Family Medicine

## 2021-11-21 DIAGNOSIS — G2581 Restless legs syndrome: Secondary | ICD-10-CM | POA: Insufficient documentation

## 2021-11-21 NOTE — Assessment & Plan Note (Signed)
Will increase her gabapentin to 800mg  BID. Call with any concerns. Continue to monitor.  ?

## 2021-11-21 NOTE — Assessment & Plan Note (Signed)
Doing well on A1c of 5.9. Continue current regimen. Call with any concerns. Refills given. Call with any concerns.  ?

## 2021-11-21 NOTE — Assessment & Plan Note (Signed)
Under good control on current regimen. Continue current regimen. Continue to monitor. Call with any concerns. Refills given. Labs drawn today.   

## 2021-11-21 NOTE — Assessment & Plan Note (Signed)
Continue to follow with endocrinology. Call with any concerns. Continue to monitor.  ?

## 2021-11-28 ENCOUNTER — Other Ambulatory Visit: Payer: Self-pay | Admitting: Family Medicine

## 2021-11-29 NOTE — Telephone Encounter (Signed)
Rx 11/18/21 #270- too soon Requested Prescriptions  Pending Prescriptions Disp Refills  . busPIRone (BUSPAR) 15 MG tablet [Pharmacy Med Name: BUSPIRONE 15MG  TABLETS] 270 tablet 0    Sig: TAKE 1 TO 2 TABLETS BY MOUTH THREE TIMES DAILY     Psychiatry: Anxiolytics/Hypnotics - Non-controlled Passed - 11/28/2021  3:43 AM      Passed - Valid encounter within last 12 months    Recent Outpatient Visits          1 week ago Diabetes mellitus type 2 with neurological manifestations [facility])   [place], Megan P, DO   6 months ago Diabetes mellitus without complication ([place])   [place], Megan P, DO   7 months ago COVID-19   [employer], Megan P, DO   9 months ago Diabetes mellitus without complication [facility])   [place], Megan P, DO   11 months ago Bipolar 2 disorder [facility])   [place], Esmeralda, DO      Future Appointments            In 2 months Johnson, Barb Merino, DO [employer]

## 2021-11-30 ENCOUNTER — Encounter (HOSPITAL_COMMUNITY): Payer: Self-pay | Admitting: Orthopedic Surgery

## 2021-11-30 ENCOUNTER — Other Ambulatory Visit: Payer: Self-pay

## 2021-11-30 NOTE — Progress Notes (Signed)
For Short Stay: [place] appointment date: N/A Date of COVID positive in last 90 days: N/A  Bowel Prep reminder: N/A   For Anesthesia: PCP - Valerie Roys, DO last office visit 11/18/21 in epic Cardiologist -  Endocrinologist-O'Connell, Justice Rocher, MD  last office visit note 11/16/21 in care everywhere Neurologist-Potter, Liliane Shi, MD   last office visit note 11/15/21 in epic Oncologist- Lawanna Kobus, MD  last office visit note 10/18/21 in care everywhere  Chest x-ray - 08/21/21 in care everywhere EKG - 08/19/21 in care everywhere Stress Test - greater than 2 years ECHO - 08/20/21 in care everywhere Cardiac Cath - N/A Pacemaker/ICD device last checked:N/A Pacemaker orders received:N/A Device Rep notified:N/A   Spinal Cord Stimulator: N/A  Sleep Study - Yes CPAP - No  Fasting Blood Sugar - N/A Checks Blood Sugar __N/A___ times a day Date and result of last Hgb A1c- 11/18/21 (5.9)  Blood Thinner Instructions: N/A Aspirin Instructions: N/A Last Dose: N/A  Activity level: Able to exercise without chest pain and/or shortness of breath       Anesthesia review:   Patient denies shortness of breath, fever, cough and chest pain at PAT appointment   Patient verbalized understanding of instructions that were given to them at the PAT appointment. Patient was also instructed that they will need to review over the PAT instructions again at home before surgery.

## 2021-12-01 ENCOUNTER — Ambulatory Visit (HOSPITAL_COMMUNITY)
Admission: RE | Admit: 2021-12-01 | Discharge: 2021-12-01 | Disposition: A | Discharge status: Home / Self Care | Payer: Managed Care, Other (non HMO) | Attending: Orthopedic Surgery | Admitting: Orthopedic Surgery

## 2021-12-01 ENCOUNTER — Ambulatory Visit (HOSPITAL_BASED_OUTPATIENT_CLINIC_OR_DEPARTMENT_OTHER): Payer: Managed Care, Other (non HMO) | Admitting: Physician Assistant

## 2021-12-01 ENCOUNTER — Other Ambulatory Visit: Payer: Self-pay

## 2021-12-01 ENCOUNTER — Ambulatory Visit (HOSPITAL_COMMUNITY): Payer: Managed Care, Other (non HMO) | Admitting: Physician Assistant

## 2021-12-01 ENCOUNTER — Encounter (HOSPITAL_COMMUNITY)
Admission: RE | Disposition: A | Discharge status: Home / Self Care | Payer: Self-pay | Source: Home / Self Care | Attending: Orthopedic Surgery

## 2021-12-01 ENCOUNTER — Encounter (HOSPITAL_COMMUNITY): Payer: Self-pay | Admitting: Orthopedic Surgery

## 2021-12-01 DIAGNOSIS — S43432A Superior glenoid labrum lesion of left shoulder, initial encounter: Secondary | ICD-10-CM

## 2021-12-01 DIAGNOSIS — K219 Gastro-esophageal reflux disease without esophagitis: Secondary | ICD-10-CM | POA: Insufficient documentation

## 2021-12-01 DIAGNOSIS — X58XXXA Exposure to other specified factors, initial encounter: Secondary | ICD-10-CM | POA: Insufficient documentation

## 2021-12-01 DIAGNOSIS — M94212 Chondromalacia, left shoulder: Secondary | ICD-10-CM | POA: Insufficient documentation

## 2021-12-01 DIAGNOSIS — S46012A Strain of muscle(s) and tendon(s) of the rotator cuff of left shoulder, initial encounter: Secondary | ICD-10-CM | POA: Diagnosis not present

## 2021-12-01 DIAGNOSIS — M8580 Other specified disorders of bone density and structure, unspecified site: Secondary | ICD-10-CM | POA: Insufficient documentation

## 2021-12-01 DIAGNOSIS — Y939 Activity, unspecified: Secondary | ICD-10-CM | POA: Diagnosis not present

## 2021-12-01 DIAGNOSIS — F419 Anxiety disorder, unspecified: Secondary | ICD-10-CM | POA: Insufficient documentation

## 2021-12-01 DIAGNOSIS — M19012 Primary osteoarthritis, left shoulder: Secondary | ICD-10-CM | POA: Diagnosis not present

## 2021-12-01 DIAGNOSIS — Z6834 Body mass index (BMI) 34.0-34.9, adult: Secondary | ICD-10-CM | POA: Insufficient documentation

## 2021-12-01 DIAGNOSIS — M7542 Impingement syndrome of left shoulder: Secondary | ICD-10-CM | POA: Diagnosis not present

## 2021-12-01 DIAGNOSIS — Z87891 Personal history of nicotine dependence: Secondary | ICD-10-CM | POA: Insufficient documentation

## 2021-12-01 DIAGNOSIS — F319 Bipolar disorder, unspecified: Secondary | ICD-10-CM | POA: Diagnosis not present

## 2021-12-01 DIAGNOSIS — R519 Headache, unspecified: Secondary | ICD-10-CM | POA: Insufficient documentation

## 2021-12-01 DIAGNOSIS — E0590 Thyrotoxicosis, unspecified without thyrotoxic crisis or storm: Secondary | ICD-10-CM | POA: Diagnosis not present

## 2021-12-01 DIAGNOSIS — E1149 Type 2 diabetes mellitus with other diabetic neurological complication: Secondary | ICD-10-CM

## 2021-12-01 DIAGNOSIS — G4730 Sleep apnea, unspecified: Secondary | ICD-10-CM | POA: Diagnosis not present

## 2021-12-01 DIAGNOSIS — G8929 Other chronic pain: Secondary | ICD-10-CM | POA: Insufficient documentation

## 2021-12-01 DIAGNOSIS — E669 Obesity, unspecified: Secondary | ICD-10-CM | POA: Insufficient documentation

## 2021-12-01 HISTORY — PX: SHOULDER ARTHROSCOPY WITH ROTATOR CUFF REPAIR: SHX5685

## 2021-12-01 HISTORY — DX: Other migraine, not intractable, without status migrainosus: G43.809

## 2021-12-01 HISTORY — DX: Prediabetes: R73.03

## 2021-12-01 HISTORY — DX: Nausea with vomiting, unspecified: Z98.890

## 2021-12-01 HISTORY — DX: Personal history of other endocrine, nutritional and metabolic disease: Z86.39

## 2021-12-01 HISTORY — DX: Unspecified osteoarthritis, unspecified site: M19.90

## 2021-12-01 HISTORY — DX: Disorder of adrenal gland, unspecified: E27.9

## 2021-12-01 HISTORY — DX: Anxiety disorder, unspecified: F41.9

## 2021-12-01 HISTORY — DX: Restless legs syndrome: G25.81

## 2021-12-01 HISTORY — DX: Thyrotoxicosis, unspecified without thyrotoxic crisis or storm: E05.90

## 2021-12-01 HISTORY — DX: Other specified disorders of adrenal gland: E27.8

## 2021-12-01 HISTORY — DX: Tremor, unspecified: R25.1

## 2021-12-01 HISTORY — DX: Nausea with vomiting, unspecified: R11.2

## 2021-12-01 HISTORY — DX: Dizziness and giddiness: R42

## 2021-12-01 SURGERY — ARTHROSCOPY, SHOULDER, WITH ROTATOR CUFF REPAIR
Anesthesia: General | Site: Shoulder | Laterality: Left

## 2021-12-01 MED ORDER — MIDAZOLAM HCL 2 MG/2ML IJ SOLN
INTRAMUSCULAR | Status: AC
  Administered 2021-12-01: 2 mg via INTRAVENOUS
  Filled 2021-12-01: qty 2

## 2021-12-01 MED ORDER — LIDOCAINE HCL (PF) 2 % IJ SOLN
INTRAMUSCULAR | Status: AC
  Filled 2021-12-01: qty 5

## 2021-12-01 MED ORDER — ONDANSETRON HCL 4 MG/2ML IJ SOLN
INTRAMUSCULAR | Status: DC | PRN
  Administered 2021-12-01: 4 mg via INTRAVENOUS

## 2021-12-01 MED ORDER — OXYCODONE-ACETAMINOPHEN 5-325 MG PO TABS: 1.0000 | ORAL_TABLET | ORAL | 0 refills | Status: DC | PRN

## 2021-12-01 MED ORDER — HYDROMORPHONE HCL 1 MG/ML IJ SOLN: 0.2500 mg | INTRAMUSCULAR | Status: DC | PRN

## 2021-12-01 MED ORDER — MIDAZOLAM HCL 2 MG/2ML IJ SOLN: 2.0000 mg | Freq: Once | INTRAMUSCULAR | Status: AC

## 2021-12-01 MED ORDER — CHLORHEXIDINE GLUCONATE 0.12 % MT SOLN
15.0000 mL | Freq: Once | OROMUCOSAL | Status: AC
  Administered 2021-12-01: 15 mL via OROMUCOSAL

## 2021-12-01 MED ORDER — ONDANSETRON HCL 4 MG PO TABS: 4.0000 mg | ORAL_TABLET | Freq: Three times a day (TID) | ORAL | 0 refills | Status: DC | PRN

## 2021-12-01 MED ORDER — DEXAMETHASONE SODIUM PHOSPHATE 4 MG/ML IJ SOLN
INTRAMUSCULAR | Status: DC | PRN
  Administered 2021-12-01: 4 mg via INTRAVENOUS

## 2021-12-01 MED ORDER — LIDOCAINE 2% (20 MG/ML) 5 ML SYRINGE
INTRAMUSCULAR | Status: DC | PRN
  Administered 2021-12-01: 100 mg via INTRAVENOUS

## 2021-12-01 MED ORDER — PROPOFOL 10 MG/ML IV BOLUS
INTRAVENOUS | Status: DC | PRN
Start: 2021-12-01 — End: 2021-12-01
  Administered 2021-12-01: 200 mg via INTRAVENOUS

## 2021-12-01 MED ORDER — FENTANYL CITRATE (PF) 100 MCG/2ML IJ SOLN
INTRAMUSCULAR | Status: DC | PRN
  Administered 2021-12-01 (×2): 50 ug via INTRAVENOUS

## 2021-12-01 MED ORDER — FENTANYL CITRATE PF 50 MCG/ML IJ SOSY
PREFILLED_SYRINGE | INTRAMUSCULAR | Status: AC
  Administered 2021-12-01: 50 ug via INTRAVENOUS
  Filled 2021-12-01: qty 2

## 2021-12-01 MED ORDER — PROPOFOL 10 MG/ML IV BOLUS
INTRAVENOUS | Status: AC
  Filled 2021-12-01: qty 20

## 2021-12-01 MED ORDER — FENTANYL CITRATE PF 50 MCG/ML IJ SOSY: 50.0000 ug | PREFILLED_SYRINGE | Freq: Once | INTRAMUSCULAR | Status: AC

## 2021-12-01 MED ORDER — BUPIVACAINE HCL (PF) 0.5 % IJ SOLN
INTRAMUSCULAR | Status: DC | PRN
  Administered 2021-12-01: 15 mL via PERINEURAL

## 2021-12-01 MED ORDER — PROPOFOL 1000 MG/100ML IV EMUL
INTRAVENOUS | Status: AC
  Filled 2021-12-01: qty 100

## 2021-12-01 MED ORDER — CYCLOBENZAPRINE HCL 10 MG PO TABS: 10.0000 mg | ORAL_TABLET | Freq: Three times a day (TID) | ORAL | 1 refills | Status: DC | PRN

## 2021-12-01 MED ORDER — SUCCINYLCHOLINE CHLORIDE 200 MG/10ML IV SOSY
PREFILLED_SYRINGE | INTRAVENOUS | Status: DC | PRN
  Administered 2021-12-01: 160 mg via INTRAVENOUS

## 2021-12-01 MED ORDER — FENTANYL CITRATE (PF) 100 MCG/2ML IJ SOLN
INTRAMUSCULAR | Status: AC
  Filled 2021-12-01: qty 2

## 2021-12-01 MED ORDER — 0.9 % SODIUM CHLORIDE (POUR BTL) OPTIME
TOPICAL | Status: DC | PRN
Start: 2021-12-01 — End: 2021-12-01
  Administered 2021-12-01: 1000 mL

## 2021-12-01 MED ORDER — SUGAMMADEX SODIUM 200 MG/2ML IV SOLN
INTRAVENOUS | Status: DC | PRN
  Administered 2021-12-01: 200 mg via INTRAVENOUS

## 2021-12-01 MED ORDER — AMISULPRIDE (ANTIEMETIC) 5 MG/2ML IV SOLN
INTRAVENOUS | Status: AC
  Filled 2021-12-01: qty 4

## 2021-12-01 MED ORDER — ACETAMINOPHEN 500 MG PO TABS
1000.0000 mg | ORAL_TABLET | Freq: Once | ORAL | Status: AC
  Administered 2021-12-01: 1000 mg via ORAL
  Filled 2021-12-01: qty 2

## 2021-12-01 MED ORDER — ROCURONIUM BROMIDE 10 MG/ML (PF) SYRINGE
PREFILLED_SYRINGE | INTRAVENOUS | Status: AC
  Filled 2021-12-01: qty 10

## 2021-12-01 MED ORDER — MEPERIDINE HCL 50 MG/ML IJ SOLN: 6.2500 mg | INTRAMUSCULAR | Status: DC | PRN

## 2021-12-01 MED ORDER — PROPOFOL 500 MG/50ML IV EMUL
INTRAVENOUS | Status: DC | PRN
  Administered 2021-12-01: 150 ug/kg/min via INTRAVENOUS

## 2021-12-01 MED ORDER — ORAL CARE MOUTH RINSE: 15.0000 mL | Freq: Once | OROMUCOSAL | Status: AC

## 2021-12-01 MED ORDER — ONDANSETRON HCL 4 MG/2ML IJ SOLN
INTRAMUSCULAR | Status: AC
  Filled 2021-12-01: qty 2

## 2021-12-01 MED ORDER — CEFAZOLIN SODIUM-DEXTROSE 2-4 GM/100ML-% IV SOLN
2.0000 g | INTRAVENOUS | Status: AC
  Administered 2021-12-01: 2 g via INTRAVENOUS
  Filled 2021-12-01: qty 100

## 2021-12-01 MED ORDER — DEXAMETHASONE SODIUM PHOSPHATE 10 MG/ML IJ SOLN
INTRAMUSCULAR | Status: AC
  Filled 2021-12-01: qty 1

## 2021-12-01 MED ORDER — SODIUM CHLORIDE 0.9 % IR SOLN
Status: DC | PRN
  Administered 2021-12-01: 6000 mL

## 2021-12-01 MED ORDER — LACTATED RINGERS IV SOLN: INTRAVENOUS | Status: DC

## 2021-12-01 MED ORDER — AMISULPRIDE (ANTIEMETIC) 5 MG/2ML IV SOLN
10.0000 mg | Freq: Once | INTRAVENOUS | Status: AC | PRN
  Administered 2021-12-01: 10 mg via INTRAVENOUS

## 2021-12-01 MED ORDER — BUPIVACAINE LIPOSOME 1.3 % IJ SUSP
INTRAMUSCULAR | Status: DC | PRN
  Administered 2021-12-01: 10 mL

## 2021-12-01 MED ORDER — ROCURONIUM BROMIDE 10 MG/ML (PF) SYRINGE
PREFILLED_SYRINGE | INTRAVENOUS | Status: DC | PRN
  Administered 2021-12-01: 70 mg via INTRAVENOUS

## 2021-12-01 SURGICAL SUPPLY — 58 items
BAG COUNTER SPONGE SURGICOUNT (BAG) ×2 IMPLANT
BAG SPNG CNTER NS LX DISP (BAG)
BLADE EXCALIBUR 4.0X13 (MISCELLANEOUS) ×3 IMPLANT
BOOTIES KNEE HIGH SLOAN (MISCELLANEOUS) ×6 IMPLANT
BURR OVAL 8 FLU 4.0X13 (MISCELLANEOUS) ×3 IMPLANT
CANNULA ACUFLEX KIT 5X76 (CANNULA) ×3 IMPLANT
CANNULA DRILOCK 5.0X75 (CANNULA) IMPLANT
CANNULA TWIST IN 8.25X7CM (CANNULA) IMPLANT
CONNECTOR 5 IN 1 STRAIGHT STRL (MISCELLANEOUS) ×3 IMPLANT
COOLER ICEMAN CLASSIC (MISCELLANEOUS) IMPLANT
DISSECTOR  3.8MM X 13CM (MISCELLANEOUS) ×2
DISSECTOR 3.8MM X 13CM (MISCELLANEOUS) ×2 IMPLANT
DRAPE INCISE 23X17 IOBAN STRL (DRAPES) ×1
DRAPE INCISE 23X17 STRL (DRAPES) ×2 IMPLANT
DRAPE INCISE IOBAN 23X17 STRL (DRAPES) ×1 IMPLANT
DRAPE INCISE IOBAN 66X45 STRL (DRAPES) ×3 IMPLANT
DRAPE ORTHO SPLIT 77X108 STRL (DRAPES)
DRAPE STERI 35X30 U-POUCH (DRAPES) ×3 IMPLANT
DRAPE SURG 17X11 SM STRL (DRAPES) ×3 IMPLANT
DRAPE SURG ORHT 6 SPLT 77X108 (DRAPES) IMPLANT
DRAPE U-SHAPE 47X51 STRL (DRAPES) ×2 IMPLANT
DRSG PAD ABDOMINAL 8X10 ST (GAUZE/BANDAGES/DRESSINGS) ×6 IMPLANT
DURAPREP 26ML APPLICATOR (WOUND CARE) ×3 IMPLANT
GAUZE PAD ABD 8X10 STRL (GAUZE/BANDAGES/DRESSINGS) ×1 IMPLANT
GAUZE SPONGE 4X4 12PLY STRL (GAUZE/BANDAGES/DRESSINGS) ×3 IMPLANT
GLOVE BIO SURGEON STRL SZ7.5 (GLOVE) ×3 IMPLANT
GLOVE BIO SURGEON STRL SZ8 (GLOVE) ×3 IMPLANT
GLOVE SS BIOGEL STRL SZ 7 (GLOVE) ×6 IMPLANT
GLOVE SS BIOGEL STRL SZ 7.5 (GLOVE) ×2 IMPLANT
GLOVE SUPERSENSE BIOGEL SZ 7 (GLOVE) ×3
GLOVE SUPERSENSE BIOGEL SZ 7.5 (GLOVE) ×1
GOWN STRL REIN XL XLG (GOWN DISPOSABLE) ×6 IMPLANT
IV NS IRRIG 3000ML ARTHROMATIC (IV SOLUTION) ×2 IMPLANT
KIT BASIN OR (CUSTOM PROCEDURE TRAY) ×3 IMPLANT
KIT SHOULDER TRACTION (DRAPES) ×3 IMPLANT
KIT TURNOVER KIT A (KITS) ×1 IMPLANT
MANIFOLD NEPTUNE II (INSTRUMENTS) ×3 IMPLANT
NDL SCORPION MULTI FIRE (NEEDLE) IMPLANT
NEEDLE SCORPION MULTI FIRE (NEEDLE) IMPLANT
NS IRRIG 1000ML POUR BTL (IV SOLUTION) ×3 IMPLANT
PACK ARTHROSCOPY WL (CUSTOM PROCEDURE TRAY) ×3 IMPLANT
PAD ARMBOARD 7.5X6 YLW CONV (MISCELLANEOUS) ×2 IMPLANT
PAD COLD SHLDR WRAP-ON (PAD) IMPLANT
PROBE APOLLO 90XL (SURGICAL WAND) ×3 IMPLANT
SLING ARM FOAM STRAP MED (SOFTGOODS) ×1 IMPLANT
SPONGE T-LAP 4X18 ~~LOC~~+RFID (SPONGE) ×3 IMPLANT
STRIP CLOSURE SKIN 1/2X4 (GAUZE/BANDAGES/DRESSINGS) ×3 IMPLANT
SUT FIBERWIRE #2 38 T-5 BLUE (SUTURE)
SUT MNCRL AB 3-0 PS2 18 (SUTURE) ×3 IMPLANT
SUT PDS AB 0 CT 36 (SUTURE) IMPLANT
SUT TIGER TAPE 7 IN WHITE (SUTURE) ×3 IMPLANT
SUTURE FIBERWR #2 38 T-5 BLUE (SUTURE) IMPLANT
TAPE FIBER 2MM 7IN #2 BLUE (SUTURE) IMPLANT
TAPE PAPER 3X10 WHT MICROPORE (GAUZE/BANDAGES/DRESSINGS) ×3 IMPLANT
TOWEL OR 17X26 10 PK STRL BLUE (TOWEL DISPOSABLE) ×3 IMPLANT
TUBING ARTHROSCOPY IRRIG 16FT (MISCELLANEOUS) ×3 IMPLANT
WATER STERILE IRR 1000ML POUR (IV SOLUTION) ×2 IMPLANT
YANKAUER SUCT BULB TIP 10FT TU (MISCELLANEOUS) ×3 IMPLANT

## 2021-12-01 NOTE — Anesthesia Postprocedure Evaluation (Signed)
Anesthesia Post Note  Patient: Carol Becker  Procedure(s) Performed: Left shoulder arthroscopy, debridement, subacromial decompression, distal clavicle resection, chondroplasty (Left: Shoulder)     Patient location during evaluation: PACU Anesthesia Type: General Level of consciousness: awake and alert Pain management: pain level controlled Vital Signs Assessment: post-procedure vital signs reviewed and stable Respiratory status: spontaneous breathing, nonlabored ventilation, respiratory function stable and patient connected to nasal cannula oxygen Cardiovascular status: blood pressure returned to baseline and stable Postop Assessment: no apparent nausea or vomiting Anesthetic complications: no   No notable events documented.  Last Vitals:  Vitals:   12/01/21 1338 12/01/21 1345  BP: 127/79 134/84  Pulse: 83 83  Resp: 16 18  Temp: 36.5 C   SpO2: 100% 97%    Last Pain:  Vitals:   12/01/21 1345  TempSrc:   PainSc: 0-No pain                 Brittane Grudzinski S

## 2021-12-01 NOTE — Anesthesia Procedure Notes (Addendum)
Anesthesia Regional Block: Interscalene brachial plexus block   Pre-Anesthetic Checklist: , timeout performed,  Correct Patient, Correct Site, Correct Laterality,  Correct Procedure, Correct Position, site marked,  Risks and benefits discussed,  Surgical consent,  Pre-op evaluation,  At surgeon's request and post-op pain management  Laterality: Left  Prep: chloraprep       Needles:  Injection technique: Single-shot  Needle Type: Echogenic Stimulator Needle     Needle Length: 10cm  Needle Gauge: 20     Additional Needles:   Procedures:,,,, ultrasound used (permanent image in chart),,    Narrative:  Start time: 12/02/2021 11:04 AM End time: 12/02/2021 11:08 AM Injection made incrementally with aspirations every 5 mL.  Performed by: Personally  Anesthesiologist: Lidia Collum, MD  Additional Notes: Standard monitors applied. Skin prepped. Good needle visualization with ultrasound. Injection made in 5cc increments with no resistance to injection. Patient tolerated the procedure well.

## 2021-12-01 NOTE — Anesthesia Preprocedure Evaluation (Addendum)
Anesthesia Evaluation  Patient identified by MRN, date of birth, ID band Patient awake    Reviewed: Allergy & Precautions, NPO status , Patient's Chart, lab work & pertinent test results, Unable to perform ROS - Chart review only  History of Anesthesia Complications (+) PONV and history of anesthetic complications  Airway Mallampati: II  TM Distance: >3 FB Neck ROM: Full    Dental  (+) Teeth Intact, Dental Advisory Given   Pulmonary sleep apnea , former smoker,    Pulmonary exam normal        Cardiovascular Normal cardiovascular exam     Neuro/Psych  Headaches, PSYCHIATRIC DISORDERS Anxiety Bipolar Disorder  Neuromuscular disease    GI/Hepatic GERD  ,  Endo/Other  diabetesHypothyroidism Hyperthyroidism Obesity  Renal/GU      Musculoskeletal  (+) Arthritis , osteopenia   Abdominal (+) + obese,   Peds  Hematology   Anesthesia Other Findings   Reproductive/Obstetrics                           Anesthesia Physical  Anesthesia Plan  ASA: 3  Anesthesia Plan: General   Post-op Pain Management: Regional block* and Tylenol PO (pre-op)*   Induction: Intravenous  PONV Risk Score and Plan: 4 or greater and Treatment may vary due to age or medical condition, Ondansetron, Dexamethasone, Midazolam, Propofol infusion and TIVA  Airway Management Planned: Oral ETT  Additional Equipment:   Intra-op Plan:   Post-operative Plan: Extubation in OR  Informed Consent: I have reviewed the patients History and Physical, chart, labs and discussed the procedure including the risks, benefits and alternatives for the proposed anesthesia with the patient or authorized representative who has indicated his/her understanding and acceptance.     Dental advisory given  Plan Discussed with:   Anesthesia Plan Comments:        Anesthesia Quick Evaluation

## 2021-12-01 NOTE — Transfer of Care (Signed)
Immediate Anesthesia Transfer of Care Note  Patient: Carol Becker  Procedure(s) Performed: Left shoulder arthroscopy, debridement, subacromial decompression, distal clavicle resection, chondroplasty (Left: Shoulder)  Patient Location: PACU  Anesthesia Type:GA combined with regional for post-op pain  Level of Consciousness: awake, alert , oriented and patient cooperative  Airway & Oxygen Therapy: Patient Spontanous Breathing and Patient connected to face mask oxygen  Post-op Assessment: Report given to RN and Post -op Vital signs reviewed and stable  Post vital signs: Reviewed and stable  Last Vitals:  Vitals Value Taken Time  BP 127/79 12/01/21 1338  Temp    Pulse 80 12/01/21 1339  Resp 20 12/01/21 1339  SpO2 100 % 12/01/21 1339  Vitals shown include unvalidated device data.  Last Pain:  Vitals:   12/01/21 1110  TempSrc:   PainSc: 0-No pain      Patients Stated Pain Goal: 4 (03/88/82 8003)  Complications: No notable events documented.

## 2021-12-01 NOTE — Op Note (Signed)
DIAGNOSES: Left shoulder,  chronic impingement syndrome, symptomatic AC joint arthritis, degenerative labral tearing, and degenerative chondrosis of the glenohumeral joint .  POST-OPERATIVE DIAGNOSIS: Same  PROCEDURE: Arthroscopic extensive debridement - 29823 Subdeltoid Bursa, Supraspinatus Tendon, Anterior Labrum, Superior Labrum, Posterior Labrum, and as well as chondroplasty of the humeral head and glenoid Arthroscopic distal clavicle excision - 12458 Arthroscopic subacromial decompression - 09983   OPERATIVE FINDING: Exam under anesthesia: Full motion with no instability patterns noted Articular space: Broad full-thickness chondral loss on the humeral head diffuse chondromalacia on the glenoid Chondral surfaces: See above Biceps: Normal Subscapularis: Intact  Supraspinatus:  partial articular and bursal tearing of the supraspinatus Infraspinatus: Partial articular tearing of the infraspinatus  Brief history:  Patient is a 61 year old female with chronic left shoulder pain of multifactorial etiology was had increasing symptoms despite prolonged attempts at conservative management.  She is brought to the operating room this time for planned left shoulder arthroscopy as described below.  Preoperative patient has been counseled regarding treatment options as well as potential risks versus benefits thereof.  Possible surgical complications were reviewed including the potential for bleeding, infection, neurovascular injury, persistence of pain, loss of motion, anesthetic complication, and the fact that arthroscopic surgery would not change any underlying osteoarthritis.  She understands, and accepts, and agrees to the plan procedure.  Procedure detail:  After undergoing routine preop evaluation patient received prophylactic antibiotics and interscalene block was established in the holding area by the anesthesia department.  Subsequently placed spine on the operating table and underwent the  smooth induction of a general endotracheal anesthesia.  Turned to the right lateral decubitus position on the beanbag and appropriately padded and protected.  The left arm was then suspended at 70 degrees of abduction with 15 pounds of traction the left shoulder girdle region was sterilely prepped and draped in standard fashion.  Timeout was called.  A posterior portal established into the glenohumeral joint and anterior portal established under direct visualization.  There was diffuse synovitis and immediately evident was severe glenohumeral arthritis with complete loss of articular cartilage over the majority of the humeral head.  Glenoid demonstrated diffuse chondromalacia.  A shaver was introduced and used to perform a limited synovectomy as well as chondroplasty of the humeral head and glenoid.  There was circumferential degenerative labral tearing and the labrum was read back to stable margin with a shaver.  The biceps tendon was noted to be of normal caliber with no obvious instability proximally or distally.  The rotator cuff showed partial articular tearing of the distal supra and infraspinatus which was treated with a shaver and I would estimate that the scan of her perhaps 10 to 15% of the tendon thickness.  We then completed a limited synovectomy and final debridement within the glenohumeral joint.  Fluid and incensed removed.  The arm was then dropped down to 30 degrees of abduction with the arthroscope introduced into the subacromial space of the posterior portal and a direct lateral portal established in the subacromial space.  Abundant dense bursal tissue and multiple adhesions were encountered and these were all divided and excised with combination the shaver and Stryker wand.  The wand was then used to remove the periosteum from the undersurface of the anterior half of the acromion and a subacromial decompression was performed with a bur crating type I morphology.  A portal was then established  directly anterior to the distal clavicle and a distal clavicle resection was performed with a bur.  Care was  taken to confirm visualization of the entire circumference of the distal clavicle to ensure adequate removal of bone.  This point the bursal surface of the rotator cuff was evaluated showing some generalized fraying on the bursal surface which was debrided.  No full-thickness defects.  We then completed the bursectomy.  Hemostasis was obtained.  Fluid and incensed removed.  The portals were closed with a Monocryl and a Steri-Strip.  A dry dressing taped about the left shoulder and left arm was placed into a sling and the patient was awakened, extubated, and taken to the recovery room in stable condition  Jenetta Loges, PA-C was utilized as an Environmental consultant throughout this case, essential for help with positioning the patient, positioning extremity, tissue manipulation, implantation of the prosthesis, suture management, wound closure, and intraoperative decision-making.  Marin Shutter MD

## 2021-12-01 NOTE — H&P (Signed)
Carol Becker    Chief Complaint: Left shoulder impingement, acromioclavicular osteoarthritis, labral tear, partial rotator cuff tear HPI: The patient is a 61 y.o. female with chronic and progressively increasing left shoulder pain related to an impingement syndrome as well as symptomatic AC joint arthritis and degenerative labral tearing.  Due to her increasing functional limitations and failure to respond to prolonged attempts at conservative management, she is brought to the operating room this time for planned left shoulder arthroscopy with debridement and repair as indicated.  Past Medical History:  Diagnosis Date   Adrenal nodule (HCC)    Anxiety    Arthritis    knees, left thumb, shoulders   Breast cancer (HCC)    Left   Cervical spinal stenosis    Family history of breast cancer    GERD (gastroesophageal reflux disease)    History of hypothyroidism    Hyperlipidemia    poss history of   Hyperthyroidism    Lung cancer ([place])    patient denies   Osteopenia    PONV (postoperative nausea and vomiting)    after wisdom teeth   Pre-diabetes    Restless leg syndrome    Sleep apnea    Tremors of nervous system    Vertigo    Vestibular migraine       Past Surgical History:  Procedure Laterality Date   ANTERIOR / POSTERIOR COMBINED FUSION CERVICAL SPINE     BREAST BIOPSY Left 03/15/2021   Stereo bx, Ribbon Clip, path pending   BREAST LUMPECTOMY Left    COLONOSCOPY WITH PROPOFOL N/A 11/05/2020   Procedure: COLONOSCOPY WITH PROPOFOL;  Surgeon: Lucilla Lame, MD;  Location: [facility];  Service: Endoscopy;  Laterality: N/A;   KNEE ARTHROSCOPY WITH EXCISION BAKER'S CYST Left    LAPAROSCOPY     Exploratory   POLYPECTOMY  11/05/2020   Procedure: POLYPECTOMY;  Surgeon: Lucilla Lame, MD;  Location: [facility];  Service: Endoscopy;;   TOTAL MASTECTOMY Bilateral    trapeziectomy Left    done at [facility]/16/20   TUBAL LIGATION     WISDOM TOOTH EXTRACTION       Family History  Problem Relation Age of Onset   Heart disease Father        CAD   Heart disease Sister 69       CAD   Parkinson's disease Sister    Cancer Maternal Grandmother        breast   Breast cancer Maternal Grandmother 16   Alzheimer's disease Maternal Grandfather    Cancer Paternal Grandmother        breast   Breast cancer Paternal Grandmother 73   Bipolar disorder Daughter    Heart disease Cousin    Breast cancer Cousin        dx 30s-40s, d. 53s-40s    Social History:  reports that she quit smoking about 10 years ago. Her smoking use included cigarettes. She has never used smokeless tobacco. She reports current alcohol use. She reports that she does not use drugs.  BMI: Estimated body mass index is 34.84 kg/m as calculated from the following:   Height as of this encounter: 5\' 4"  (1.626 m).   Weight as of this encounter: 92.1 kg.  Lab Results  Component Value Date   ALBUMIN 4.4 11/18/2021   Diabetes:   Patient has a diagnosis of diabetes,  Lab Results  Component Value Date   HGBA1C 5.9 (H) 11/18/2021   Smoking Status: Social History  Tobacco Use  Smoking Status Former   Types: Cigarettes   Quit date: 04/06/2011   Years since quitting: 10.6  Smokeless Tobacco Never   The patient is not currently a tobacco user. Counseling given: Not Answered      Medications Prior to Admission  Medication Sig Dispense Refill   busPIRone (BUSPAR) 15 MG tablet TAKE 1 TO 2 TABLETS BY MOUTH THREE TIMES DAILY (Patient taking differently: Take 30 mg by mouth 3 (three) times daily.) 270 tablet 0   clonazePAM (KLONOPIN) 0.5 MG tablet Take 0.25 mg by mouth 2 (two) times daily.     gabapentin (NEURONTIN) 800 MG tablet Take 1 tablet (800 mg total) by mouth 2 (two) times daily. 180 tablet 0   Insulin Pen Needle (PEN NEEDLES) 30G X 5 MM MISC 1 each by Does not apply route once a week. 100 each 12   MELATONIN PO Take 1 tablet by mouth at bedtime as needed (sleep).      meloxicam (MOBIC) 15 MG tablet Take 1 tablet (15 mg total) by mouth daily. 90 tablet 1   methimazole (TAPAZOLE) 5 MG tablet Take 5 mg by mouth daily.     Multiple Vitamin (MULTIVITAMIN WITH MINERALS) TABS tablet Take 1 tablet by mouth daily.     nortriptyline (PAMELOR) 10 MG capsule Take 30 mg by mouth at bedtime.     omeprazole (PRILOSEC) 20 MG capsule Take 20 mg by mouth daily.     polyethylene glycol powder (GLYCOLAX/MIRALAX) 17 GM/SCOOP powder Take 17 g by mouth 2 (two) times daily as needed. (Patient taking differently: Take 17 g by mouth at bedtime.) 3350 g 1   Semaglutide,0.25 or 0.5MG /DOS, (OZEMPIC, 0.25 OR 0.5 MG/DOSE,) 2 MG/3ML SOPN Inject 0.5 mg into the skin once a week. (Patient taking differently: Inject 0.25-0.5 mg into the skin See admin instructions. Inject 0.25 mg on Sunday 5/28 then start injection 0.5 mg weekly) 9 mL 1   venlafaxine XR (EFFEXOR-XR) 75 MG 24 hr capsule Take 75 mg nightly for two weeks then increase to 150 mg at night for two weeks then increase to 225 mg at night and continue that dose       Physical Exam: Left shoulder demonstrates painful motion as noted at her recent office visits.  She maintains good strength to manual muscle testing.  She is neurovascular intact in the left upper extremity.  Radiographs  Previous plain films show relatively good preservation of the joint space although there are some very mild degenerative changes noted.  AC joint arthritis noted.  Previous MRI scan shows rotator cuff tendinosis but no obvious discrete full-thickness rotator cuff tear.  Vitals  Temp:  [98.1 F (36.7 C)] 98.1 F (36.7 C) (05/25 1050) Pulse Rate:  [81-88] 84 (05/25 1115) Resp:  [14-16] 14 (05/25 1115) BP: (121-131)/(74-84) 122/74 (05/25 1115) SpO2:  [98 %-100 %] 98 % (05/25 1115) Weight:  [92.1 kg] 92.1 kg (05/24 1507)  Assessment/Plan  Impression: Left shoulder impingement, acromioclavicular osteoarthritis, labral tear, partial rotator cuff  tear  Plan of Action: Procedure(s): Left shoulder arthroscopy, debridement, subacromial decompression, distal clavicle resection, possible rotator cuff repair  Carol Becker 12/01/2021, 11:57 AM Contact # [phone]

## 2021-12-01 NOTE — Discharge Instructions (Signed)
   Carol Becker. Supple, M.D., F.A.A.O.S. Orthopaedic Surgery Specializing in Arthroscopic and Reconstructive Surgery of the Shoulder [phone] [address] [place], [place] [address] - Fax [phone]   POST-OP SHOULDER ARTHROSCOPY INSTRUCTIONS  1. Call the office at [phone] to schedule your first post-op appointment 7-10 days from the date of your surgery.  2. Leave the steri-strips in place over your incisions when performing dressing changes and showering. You may remove your dressings and begin showering 72 hours from surgery. You can expect drainage that is clear to bloody in nature that occasionally will soak through your dressings. If this occurs go ahead and perform a dressing change. The drainage should lessen daily and when there is no drainage from your incisions feel free to go without a dressing.  3. Wear your sling for comfort. You may come out of your sling for ad lib activity and even decide not to use the sling at all. If you find you are more comfortable in your sling, make sure you come out of your sling at least 3-4 times a day to do the exercises that are included below.  4. Range of motion to your elbow, wrist, and hand are encouraged 3-5 times daily. Exercise to your hand and fingers helps to reduce swelling you may experience.  5. Utilize ice to the shoulder 3-4 times minimum a day and additionally if you are experiencing pain.  6. You may drive when safely off narcotics and muscle relaxants.  7.Pain control following an exparel block  To help control your post-operative pain you received a nerve block  performed with Exparel which is a long acting anesthetic (numbing agent) which can provide pain relief and sensations of numbness (and relief of pain) in the operative shoulder and arm for up to 3 days. Sometimes it provides mixed relief, meaning you may still have numbness in certain areas of the arm but can still be able to move  parts of that arm,  hand, and fingers. We recommend that your prescribed pain medications  be used as needed. We do not feel it is necessary to "pre medicate" and "stay ahead" of pain.  Taking narcotic pain medications when you are not having any pain can lead to unnecessary and potentially dangerous side effects.    8. Pain medications can produce constipation along with their use. If you experience this, the use of an over the counter stool softener or laxative daily is recommended.   9. For additional questions or concerns, please do not hesitate to call the office. If after hours there is an answering service to forward your concerns to the physician on call.   POST-OP EXERCISES  The pendulum exercises should be performed while bending at the waist as far over as possible thereby letting gravity do the work for you.  Range of Motion Exercises: Pendulum (circular)  Repeat 20 times. Do 3 sessions per day.     Range of Motion Exercises: Pendulum (side-to-side)  Repeat 20 times. Do 3 sessions per day.    Range of Motion Exercises (self-stretching activities):  Slide arm up wall with palm toward you, moving closer to the wall. Hold for 5 seconds.  Repeat 10 times. Do 3 sessions per day.

## 2021-12-01 NOTE — Anesthesia Procedure Notes (Signed)
Procedure Name: Intubation Date/Time: 12/01/2021 12:10 PM Performed by: Claudia Desanctis, CRNA Pre-anesthesia Checklist: Emergency Drugs available, Suction available, Patient identified and Patient being monitored Patient Re-evaluated:Patient Re-evaluated prior to induction Oxygen Delivery Method: Circle system utilized Preoxygenation: Pre-oxygenation with 100% oxygen Induction Type: IV induction Ventilation: Two handed mask ventilation required Laryngoscope Size: Glidescope and 3 Grade View: Grade I Tube type: Oral Tube size: 7.0 mm Number of attempts: 1 Airway Equipment and Method: Video-laryngoscopy Placement Confirmation: ETT inserted through vocal cords under direct vision, positive ETCO2 and breath sounds checked- equal and bilateral Secured at: 22 cm Tube secured with: Tape Dental Injury: Teeth and Oropharynx as per pre-operative assessment  Comments: Intubation by Liane Comber, SRNA

## 2021-12-02 ENCOUNTER — Encounter: Payer: Self-pay | Admitting: Family Medicine

## 2021-12-02 ENCOUNTER — Encounter (HOSPITAL_COMMUNITY): Payer: Self-pay | Admitting: Orthopedic Surgery

## 2021-12-02 NOTE — Addendum Note (Signed)
Addendum  created 12/02/21 1416 by Lidia Collum, MD   Clinical Note Signed, Intraprocedure Blocks edited, SmartForm saved

## 2022-02-17 ENCOUNTER — Other Ambulatory Visit: Payer: Self-pay | Admitting: Family Medicine

## 2022-02-17 NOTE — Telephone Encounter (Signed)
Requested Prescriptions  Pending Prescriptions Disp Refills  . busPIRone (BUSPAR) 15 MG tablet [Pharmacy Med Name: BUSPIRONE 15MG  TABLETS] 270 tablet 0    Sig: TAKE 1 TO 2 TABLETS BY MOUTH THREE TIMES DAILY     Psychiatry: Anxiolytics/Hypnotics - Non-controlled Passed - 02/17/2022  8:04 AM      Passed - Valid encounter within last 12 months    Recent Outpatient Visits          3 months ago Diabetes mellitus type 2 with neurological manifestations ([place])   [place], Megan P, DO   9 months ago Diabetes mellitus without complication ([place])   [place], Megan P, DO   10 months ago COVID-19   [employer], [place], DO   1 year ago Diabetes mellitus without complication [facility])   [place], Megan P, DO   1 year ago Bipolar 2 disorder [facility])   [place], [place], DO      Future Appointments            In 1 week Johnson, Barb Merino, DO [employer]

## 2022-02-24 ENCOUNTER — Encounter: Payer: Self-pay | Admitting: Family Medicine

## 2022-02-24 ENCOUNTER — Ambulatory Visit (INDEPENDENT_AMBULATORY_CARE_PROVIDER_SITE_OTHER): Payer: Managed Care, Other (non HMO) | Admitting: Family Medicine

## 2022-02-24 VITALS — BP 107/70 | HR 89 | Temp 98.1°F | Wt 198.5 lb

## 2022-02-24 DIAGNOSIS — Z23 Encounter for immunization: Secondary | ICD-10-CM

## 2022-02-24 DIAGNOSIS — E1149 Type 2 diabetes mellitus with other diabetic neurological complication: Secondary | ICD-10-CM

## 2022-02-24 DIAGNOSIS — G43809 Other migraine, not intractable, without status migrainosus: Secondary | ICD-10-CM

## 2022-02-24 DIAGNOSIS — E0590 Thyrotoxicosis, unspecified without thyrotoxic crisis or storm: Secondary | ICD-10-CM

## 2022-02-24 LAB — BAYER DCA HB A1C WAIVED: HB A1C (BAYER DCA - WAIVED): 5.5 % (ref 4.8–5.6)

## 2022-02-24 MED ORDER — SEMAGLUTIDE (1 MG/DOSE) 4 MG/3ML ~~LOC~~ SOPN: 1.0000 mg | PEN_INJECTOR | SUBCUTANEOUS | 1 refills | Status: DC

## 2022-02-24 MED ORDER — GABAPENTIN 600 MG PO TABS: ORAL_TABLET | ORAL | 1 refills | Status: DC

## 2022-02-24 NOTE — Assessment & Plan Note (Signed)
Doing great with A1c of 5.5. Would like to lose some more weight. Will increase her ozempic to 1mg . Tolerating medicine well. Recheck 6 months at physical. Call with any concerns. Continue to monitor.

## 2022-02-24 NOTE — Assessment & Plan Note (Signed)
Would like to get 2nd opinion. Referral to neuro made today.

## 2022-02-24 NOTE — Progress Notes (Signed)
BP 107/70   Pulse 89   Temp 98.1 F (36.7 C)   Wt 198 lb 8 oz (90 kg)   BMI 34.07 kg/m    Subjective:    Patient ID: Carol Becker, female    DOB: [birth year], 61 y.o.   MRN: [id number]  HPI: Carol Becker is a 61 y.o. female  Chief Complaint  Patient presents with   Diabetes    Patient states she has not had eye exam this year    DIABETES Hypoglycemic episodes:no Polydipsia/polyuria: no Visual disturbance: no Chest pain: no Paresthesias: no Glucose Monitoring: no Taking Insulin?: no Blood Pressure Monitoring: not checking Retinal Examination: Not up to Date Foot Exam: Up to Date Diabetic Education: Completed Pneumovax: Up to Date Influenza: Up to Date Aspirin: no  Relevant past medical, surgical, family and social history reviewed and updated as indicated. Interim medical history since our last visit reviewed. Allergies and medications reviewed and updated.  Review of Systems  Constitutional: Negative.   Respiratory: Negative.    Cardiovascular: Negative.   Musculoskeletal: Negative.   Neurological: Negative.   Psychiatric/Behavioral: Negative.      Per HPI unless specifically indicated above     Objective:    BP 107/70   Pulse 89   Temp 98.1 F (36.7 C)   Wt 198 lb 8 oz (90 kg)   BMI 34.07 kg/m   Wt Readings from Last 3 Encounters:  02/24/22 198 lb 8 oz (90 kg)  11/30/21 203 lb (92.1 kg)  11/18/21 206 lb 9.6 oz (93.7 kg)    Physical Exam Vitals and nursing note reviewed.  Constitutional:      General: She is not in acute distress.    Appearance: Normal appearance. She is not ill-appearing, toxic-appearing or diaphoretic.  HENT:     Head: Normocephalic and atraumatic.     Right Ear: External ear normal.     Left Ear: External ear normal.     Nose: Nose normal.     Mouth/Throat:     Mouth: Mucous membranes are moist.     Pharynx: Oropharynx is clear.  Eyes:     General: No scleral icterus.       Right eye: No discharge.         Left eye: No discharge.     Extraocular Movements: Extraocular movements intact.     Conjunctiva/sclera: Conjunctivae normal.     Pupils: Pupils are equal, round, and reactive to light.  Cardiovascular:     Rate and Rhythm: Normal rate and regular rhythm.     Pulses: Normal pulses.     Heart sounds: Normal heart sounds. No murmur heard.    No friction rub. No gallop.  Pulmonary:     Effort: Pulmonary effort is normal. No respiratory distress.     Breath sounds: Normal breath sounds. No stridor. No wheezing, rhonchi or rales.  Chest:     Chest wall: No tenderness.  Musculoskeletal:        General: Normal range of motion.     Cervical back: Normal range of motion and neck supple.  Skin:    General: Skin is warm and dry.     Capillary Refill: Capillary refill takes less than 2 seconds.     Coloration: Skin is not jaundiced or pale.     Findings: No bruising, erythema, lesion or rash.  Neurological:     General: No focal deficit present.     Mental Status: She is alert and oriented to  person, place, and time. Mental status is at baseline.  Psychiatric:        Mood and Affect: Mood normal.        Behavior: Behavior normal.        Thought Content: Thought content normal.        Judgment: Judgment normal.     Results for orders placed or performed in visit on 02/24/22  Bayer DCA Hb A1c Waived  Result Value Ref Range   HB A1C (BAYER DCA - WAIVED) 5.5 4.8 - 5.6 %      Assessment & Plan:   Problem List Items Addressed This Visit       Cardiovascular and Mediastinum   Vestibular migraine    Would like to get 2nd opinion. Referral to neuro made today.      Relevant Medications   gabapentin (NEURONTIN) 600 MG tablet   Other Relevant Orders   Ambulatory referral to Neurology     Endocrine   Hyperthyroidism    Would like to get 2nd opinion. Referral to endo made today.      Relevant Orders   Ambulatory referral to Endocrinology   Type 2 diabetes mellitus with  neurological complications ([place]) - Primary    Doing great with A1c of 5.5. Would like to lose some more weight. Will increase her ozempic to 1mg . Tolerating medicine well. Recheck 6 months at physical. Call with any concerns. Continue to monitor.       Relevant Medications   Semaglutide, 1 MG/DOSE, 4 MG/3ML SOPN   Other Relevant Orders   Bayer DCA Hb A1c Waived (Completed)   Other Visit Diagnoses     Need for shingles vaccine       Shingrix given today.   Relevant Orders   Zoster Recombinant (Shingrix ) (Completed)        Follow up plan: Return in about 6 months (around 08/27/2022) for physical.

## 2022-02-24 NOTE — Assessment & Plan Note (Signed)
Would like to get 2nd opinion. Referral to endo made today.

## 2022-02-28 ENCOUNTER — Telehealth: Payer: Self-pay

## 2022-02-28 ENCOUNTER — Encounter: Payer: Self-pay | Admitting: Family Medicine

## 2022-02-28 NOTE — Telephone Encounter (Signed)
PA initiated via CoverMyMeds for Ozempic 1MG   Key: [id number] Medication has been approved, notified patient via [place].

## 2022-03-07 ENCOUNTER — Ambulatory Visit (INDEPENDENT_AMBULATORY_CARE_PROVIDER_SITE_OTHER): Payer: Managed Care, Other (non HMO) | Admitting: Psychiatry

## 2022-03-07 VITALS — BP 132/79 | HR 90 | Ht 64.0 in | Wt 200.0 lb

## 2022-03-07 DIAGNOSIS — R413 Other amnesia: Secondary | ICD-10-CM

## 2022-03-07 MED ORDER — NORTRIPTYLINE HCL 50 MG PO CAPS: 50.0000 mg | ORAL_CAPSULE | Freq: Every day | ORAL | 11 refills | Status: DC

## 2022-03-07 NOTE — Progress Notes (Signed)
Referring:  Valerie Roys, DO [place],  [place] [id number]  PCP: Valerie Roys, DO  Neurology was asked to evaluate Carol Becker, a 61 for a chief complaint of headaches.  Our recommendations of care will be communicated by shared medical record.    CC:  vertigo  History provided from self, husband  HPI:  Medical co-morbidities: DM2, hyperthyroidism, anxiety  The patient presents for evaluation of vertigo which began several years ago. This worsened when she had a lumpectomy in 2022. She has been diagnosed with vestibular migraine, though she reports she has never had a headache associated with it. She does report a history of "sinus headaches". Vertigo can last for several hours at a time. It is triggered by busy visual patterns, like grocery stores with multiple items in the aisles. Can be triggered by riding in the car when she does not know which direction it will turn. Vertigo can last several hours and she will sometimes need to sleep it off. Vertigo is associated with nausea, and she takes dramamine and meclizine as needed. Tried Zofran previously which was ineffective. She takes nortriptyline at bedtime which has helped reduce the frequency of her vertigo. She is also on Effexor for anxiety.  She has a history of BPPV which feels different from her current vertigo. This vertigo is positional and resolves with Epley maneuver. Did vestibular therapy previously including visual fixation exercises, which she did not find helpful.  She has also started to get intermittently disoriented. Has gotten lost while she was driving. Just last week went to the restroom at [facility] and completely forgot where she was.  Also notices a tremor with fine motor movements which occurs when she overexerts herself. Of note, recent TSH was <0.01 and she is currently having her thyroid medications adjusted.  Vision has gotten blurry over the past year. Initially was intermittent but now it is all  the time.  Current Treatment: Abortive Dramamine meclizine  Preventative Nortriptyline 30 mg QHS Effexor 225 mg daily  Prior Therapies                                 Effexor 225 mg daily Nortriptyline 30 mg QHS Gabapentin 600/1200 Seroquel - tremor  LABS: CBC    Component Value Date/Time   WBC 6.1 11/18/2021 1005   RBC 4.48 11/18/2021 1005   HGB 12.6 11/18/2021 1005   HCT 38.3 11/18/2021 1005   PLT 311 11/18/2021 1005   MCV 86 11/18/2021 1005   MCH 28.1 11/18/2021 1005   MCHC 32.9 11/18/2021 1005   RDW 12.8 11/18/2021 1005   LYMPHSABS 1.4 11/18/2021 1005   EOSABS 0.1 11/18/2021 1005   BASOSABS 0.0 11/18/2021 1005      Latest Ref Rng & Units 11/18/2021   10:05 AM 02/11/2021    3:21 PM 08/27/2020    2:14 PM  CMP  Glucose 70 - 99 mg/dL 117  114  175   BUN 6 - 24 mg/dL 13  13  11    Creatinine 0.57 - 1.00 mg/dL 0.86  0.88  0.81   Sodium 134 - 144 mmol/L 141  141  136   Potassium 3.5 - 5.2 mmol/L 4.4  4.3  4.3   Chloride 96 - 106 mmol/L 104  102  100   CO2 20 - 29 mmol/L 23  24  22    Calcium 8.7 - 10.2 mg/dL 9.5  9.6  9.9   Total Protein 6.0 - 8.5 g/dL 7.1  7.1  7.1   Total Bilirubin 0.0 - 1.2 mg/dL 0.3  0.2  0.3   Alkaline Phos 44 - 121 IU/L 109  99  114   AST 0 - 40 IU/L 26  16  46   ALT 0 - 32 IU/L 29  29  50      IMAGING:  MRI brain 09/03/20: Brain unremarkable. 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   Imaging independently reviewed on March 07, 2022   Current Outpatient Medications on File Prior to Visit  Medication Sig Dispense Refill   acetaminophen (TYLENOL) 500 MG tablet Take 1,000 mg by mouth every 6 (six) hours as needed for mild pain.     busPIRone (BUSPAR) 15 MG tablet TAKE 1 TO 2 TABLETS BY MOUTH THREE TIMES DAILY (Patient taking differently: Take 15-30 mg by mouth 3 (three) times daily.) 270 tablet 0   clonazePAM (KLONOPIN) 0.5 MG tablet Take 0.25 mg by mouth 2 (two) times daily.      gabapentin (NEURONTIN) 600 MG tablet Take 1 pill in the AM and 2 pills in the PM (Patient taking differently: Take 600-1,200 mg by mouth See admin instructions. Take 1 pill in the AM ( 600 mg)  and 2 pills ( 1200 mg) in the PM) 270 tablet 1   methimazole (TAPAZOLE) 5 MG tablet Take 7.5 mg by mouth daily. Taking 1 & 1/2 tablets daily     omeprazole (PRILOSEC) 20 MG capsule Take 20 mg by mouth daily.     Semaglutide, 1 MG/DOSE, 4 MG/3ML SOPN Inject 1 mg as directed once a week. (Patient taking differently: Inject 1 mg as directed once a week. Sunday) 9 mL 1   venlafaxine XR (EFFEXOR-XR) 75 MG 24 hr capsule Take 225 mg by mouth at bedtime.     Multiple Vitamin (MULTIVITAMIN WITH MINERALS) TABS tablet Take 1 tablet by mouth daily.     No current facility-administered medications on file prior to visit.     Allergies: Allergies  Allergen Reactions   Crestor [Rosuvastatin Calcium] Other (See Comments)    Indigestion   Sulfa Antibiotics Nausea Only   Zithromax [Azithromycin] Other (See Comments)    Not effective    Family History: Migraine or other headaches in the family:  no Aneurysms in a first degree relative:  no Brain tumors in the family:  no Other neurological illness in the family:   Alzheimer's  Past Medical History: Past Medical History:  Diagnosis Date   Adrenal nodule (HCC)    Anxiety    Arthritis    knees, left thumb, shoulders   Breast cancer ([place])    Left   Cervical spinal stenosis    Family history of breast cancer    GERD (gastroesophageal reflux disease)    History of hypothyroidism    Hyperlipidemia    poss history of   Hyperthyroidism    Osteopenia    PONV (postoperative nausea and vomiting)    after wisdom teeth   Pre-diabetes    Restless leg syndrome    Sleep apnea    Tremors of nervous system    Vertigo    Vestibular migraine     Past Surgical History Past Surgical History:  Procedure Laterality Date   ANTERIOR / POSTERIOR COMBINED FUSION  CERVICAL SPINE     BREAST BIOPSY Left 03/15/2021   Stereo bx, Ribbon Clip, path pending   BREAST LUMPECTOMY Left  COLONOSCOPY WITH PROPOFOL N/A 11/05/2020   Procedure: COLONOSCOPY WITH PROPOFOL;  Surgeon: Lucilla Lame, MD;  Location: [facility];  Service: Endoscopy;  Laterality: N/A;   KNEE ARTHROSCOPY WITH EXCISION BAKER'S CYST Left    LAPAROSCOPY     Exploratory   POLYPECTOMY  11/05/2020   Procedure: POLYPECTOMY;  Surgeon: Lucilla Lame, MD;  Location: [facility];  Service: Endoscopy;;   SHOULDER ARTHROSCOPY WITH ROTATOR CUFF REPAIR Left 12/01/2021   Procedure: Left shoulder arthroscopy, debridement, subacromial decompression, distal clavicle resection, chondroplasty;  Surgeon: Justice Britain, MD;  Location: [facility];  Service: Orthopedics;  Laterality: Left;  94min   TOTAL MASTECTOMY Bilateral    trapeziectomy Left    done at [facility]16/20   TUBAL LIGATION     WISDOM TOOTH EXTRACTION      Social History: Social History   Tobacco Use   Smoking status: Former    Types: Cigarettes    Quit date: 04/06/2011    Years since quitting: 10.9   Smokeless tobacco: Never  Vaping Use   Vaping Use: Former  Substance Use Topics   Alcohol use: Yes    Comment: On Occasion   Drug use: No    ROS: Negative for fevers, chills. Positive for vertigo. All other systems reviewed and negative unless stated otherwise in HPI.   Physical Exam:   Vital Signs: BP 132/79   Pulse 90   Ht 5\' 4"  (1.626 m)   Wt 200 lb (90.7 kg)   BMI 34.33 kg/m  GENERAL: well appearing,in no acute distress,alert SKIN:  Color, texture, turgor normal. No rashes or lesions HEAD:  Normocephalic/atraumatic. CV:  RRR RESP: Normal respiratory effort MSK: no tenderness to palpation over occiput, neck, or shoulders  NEUROLOGICAL: Mental Status: Alert, oriented to person, place and time,Follows commands Cranial Nerves: PERRL, visual fields intact to confrontation, extraocular movements intact, facial  sensation intact, no facial droop or ptosis, hearing grossly intact, no dysarthria Motor: muscle strength 5/5 both upper and lower extremities. No tremor Reflexes: 2+ throughout Sensation: intact to light touch all 4 extremities Coordination: Finger-to- nose-finger intact bilaterally Gait: normal-based   IMPRESSION: 61 year old female with a history of DM2, hyperthyroidism, anxiety who presents for evaluation of vertigo. While vertigo is not associated with headache, she does report a history of "sinus headaches". Associated phonophobia and prolonged duration of vertigo episodes does raise suspicion for vestibular migraines. Sample of Roselyn Meier provided today to try during her next episode of vertigo. If this is helpful would consider PRN migraine treatment for her vertigo. Nortriptyline has helped reduce the frequency of her episodes. She is already on multiple serotonergic medications but is interested increasing nortriptyline. Will increase nortriptyline dose and decrease her dose of venlafaxine. Could consider adding propranolol in the future which may also help with her anxiety and tremors. Suspect her uncontrolled hyperthyroidism is contributing to her tremors and short term memory issues. Will also check B12 levels today.  PLAN: -Decrease venlafaxine to 150 mg QHS. Increase nortriptyline to 50 mg QHS -Blood work: B12 level -Sample of Ubrelvy provided -Next steps: consider propranolol, PRN triptan or gepant, retrial of vestibular therapy   I spent a total of 60 minutes chart reviewing and counseling the patient. Headache education was done. Discussed treatment options including preventive and acute medications, and physical therapy. Discussed medication side effects, adverse reactions and drug interactions. Written educational materials and patient instructions outlining all of the above were given.  Follow-up: 3 months   Genia Harold, MD 03/07/2022  12:10 PM

## 2022-03-07 NOTE — Patient Instructions (Addendum)
Increase nortriptyline to 50 mg at bedtime. Decrease venlafaxine to 2 pills at bedtime.  Try ubrelvy at the onset of next vertigo episode. May repeat a dose in 2 hours. Max dose 2 pills in 24 hours.  Blood work: B12 level

## 2022-03-08 ENCOUNTER — Encounter: Payer: Self-pay | Admitting: Family Medicine

## 2022-03-08 ENCOUNTER — Encounter: Payer: Self-pay | Admitting: Psychiatry

## 2022-03-08 LAB — VITAMIN B12: Vitamin B-12: 516 pg/mL (ref 232–1245)

## 2022-03-09 NOTE — Patient Instructions (Addendum)
SURGICAL WAITING ROOM VISITATION Patients having surgery or a procedure may have no more than 2 support people in the waiting area - these visitors may rotate.   Children under the age of 93 must have an adult with them who is not the patient. If the patient needs to stay at the hospital during part of their recovery, the visitor guidelines for inpatient rooms apply. Pre-op nurse will coordinate an appropriate time for 1 support person to accompany patient in pre-op.  This support person may not rotate.    Please refer to the [facility] website for the visitor guidelines for Inpatients (after your surgery is over and you are in a regular room).    Your procedure is scheduled on: 03/16/22   Report to [facility] Main Entrance    Report to admitting at 7:40 AM   Call this number if you have problems the morning of surgery [phone]   Do not eat food :After Midnight.   After Midnight you may have the following liquids until 7:10 AM DAY OF SURGERY  Water Non-Citrus Juices (without pulp, NO RED) Carbonated Beverages Black Coffee (NO MILK/CREAM OR CREAMERS, sugar ok)  Clear Tea (NO MILK/CREAM OR CREAMERS, sugar ok) regular and decaf                             Plain Jell-O (NO RED)                                           Fruit ices (not with fruit pulp, NO RED)                                     Popsicles (NO RED)                                                               Sports drinks like Gatorade (NO RED)                 The day of surgery:  Drink ONE (1) Pre-Surgery G2 at 7:10 AM the morning of surgery. Drink in one sitting. Do not sip.  This drink was given to you during your hospital  pre-op appointment visit. Nothing else to drink after completing the  Pre-Surgery G2.          If you have questions, please contact your surgeon's office.   FOLLOW BOWEL PREP AND ANY ADDITIONAL PRE OP INSTRUCTIONS YOU RECEIVED FROM YOUR SURGEON'S OFFICE!!!     Oral Hygiene is  also important to reduce your risk of infection.                                    Remember - BRUSH YOUR TEETH THE MORNING OF SURGERY WITH YOUR REGULAR TOOTHPASTE   Take these medicines the morning of surgery with A SIP OF WATER: Tylenol, Buspirone, Clonazepam, Gabapentin, Omeprazole   DO NOT TAKE ANY ORAL DIABETIC MEDICATIONS DAY OF YOUR SURGERY  How to Manage  Your Diabetes Before and After Surgery  Why is it important to control my blood sugar before and after surgery? Improving blood sugar levels before and after surgery helps healing and can limit problems. A way of improving blood sugar control is eating a healthy diet by:  Eating less sugar and carbohydrates  Increasing activity/exercise  Talking with your doctor about reaching your blood sugar goals High blood sugars (greater than 180 mg/dL) can raise your risk of infections and slow your recovery, so you will need to focus on controlling your diabetes during the weeks before surgery. Make sure that the doctor who takes care of your diabetes knows about your planned surgery including the date and location.  How do I manage my blood sugar before surgery? Check your blood sugar at least 4 times a day, starting 2 days before surgery, to make sure that the level is not too high or low. Check your blood sugar the morning of your surgery when you wake up and every 2 hours until you get to the Short Stay unit. If your blood sugar is less than 70 mg/dL, you will need to treat for low blood sugar: Do not take insulin. Treat a low blood sugar (less than 70 mg/dL) with  cup of clear juice (cranberry or apple), 4 glucose tablets, OR glucose gel. Recheck blood sugar in 15 minutes after treatment (to make sure it is greater than 70 mg/dL). If your blood sugar is not greater than 70 mg/dL on recheck, call [phone] for further instructions. Report your blood sugar to the short stay nurse when you get to Short Stay.  If you are admitted to  the hospital after surgery: Your blood sugar will be checked by the staff and you will probably be given insulin after surgery (instead of oral diabetes medicines) to make sure you have good blood sugar levels. The goal for blood sugar control after surgery is 80-180 mg/dL.   WHAT DO I DO ABOUT MY DIABETES MEDICATION?  Do not take oral diabetes medicines (pills) the morning of surgery.  Do not take Ozempic on Sunday 03/12/22  The day of surgery, do not take other diabetes injectables, including Byetta (exenatide), Bydureon (exenatide ER), Victoza (liraglutide), or Trulicity (dulaglutide).  Reviewed and Endorsed by [facility] Patient Education Committee, August 2015                               You may not have any metal on your body including hair pins, jewelry, and body piercing             Do not wear make-up, lotions, powders, perfumes, or deodorant  Do not wear nail polish including gel and S&S, artificial/acrylic nails, or any other type of covering on natural nails including finger and toenails. If you have artificial nails, gel coating, etc. that needs to be removed by a nail salon please have this removed prior to surgery or surgery may need to be canceled/ delayed if the surgeon/ anesthesia feels like they are unable to be safely monitored.   Do not shave  48 hours prior to surgery.    Do not bring valuables to the hospital. [place].  DO NOT [place]. PHARMACY WILL DISPENSE MEDICATIONS LISTED ON YOUR MEDICATION LIST TO YOU DURING YOUR ADMISSION IN THE  HOSPITAL!    Patients discharged on the day of surgery will not be allowed to drive home.  Someone NEEDS to stay with you for the first 24 hours after anesthesia.   Special Instructions: Bring a copy of your healthcare power of attorney and living will documents         the day of surgery if you haven't scanned them before.              Please  read over the following fact sheets you were given: IF YOU HAVE QUESTIONS ABOUT YOUR PRE-OP INSTRUCTIONS PLEASE CALL [facility] - Preparing for Surgery Before surgery, you can play an important role.  Because skin is not sterile, your skin needs to be as free of germs as possible.  You can reduce the number of germs on your skin by washing with CHG (chlorahexidine gluconate) soap before surgery.  CHG is an antiseptic cleaner which kills germs and bonds with the skin to continue killing germs even after washing. Please DO NOT use if you have an allergy to CHG or antibacterial soaps.  If your skin becomes reddened/irritated stop using the CHG and inform your nurse when you arrive at Short Stay. Do not shave (including legs and underarms) for at least 48 hours prior to the first CHG shower.  You may shave your face/neck.  Please follow these instructions carefully:  1.  Shower with CHG Soap the night before surgery and the  morning of surgery.  2.  If you choose to wash your hair, wash your hair first as usual with your normal  shampoo.  3.  After you shampoo, rinse your hair and body thoroughly to remove the shampoo.                             4.  Use CHG as you would any other liquid soap.  You can apply chg directly to the skin and wash.  Gently with a scrungie or clean washcloth.  5.  Apply the CHG Soap to your body ONLY FROM THE NECK DOWN.   Do   not use on face/ open                           Wound or open sores. Avoid contact with eyes, ears mouth and   genitals (private parts).                       Wash face,  Genitals (private parts) with your normal soap.             6.  Wash thoroughly, paying special attention to the area where your    surgery  will be performed.  7.  Thoroughly rinse your body with warm water from the neck down.  8.  DO NOT shower/wash with your normal soap after using and rinsing off the CHG Soap.                9.  Pat yourself dry with a  clean towel.            10.  Wear clean pajamas.            11.  Place clean sheets on your bed the night of your first shower and do not  sleep with pets. Day of Surgery : Do not apply any lotions/deodorants  the morning of surgery.  Please wear clean clothes to the hospital/surgery center.  FAILURE TO FOLLOW THESE INSTRUCTIONS MAY RESULT IN THE CANCELLATION OF YOUR SURGERY  PATIENT SIGNATURE_________________________________  NURSE SIGNATURE__________________________________  ________________________________________________________________________   Adam Phenix  An incentive spirometer is a tool that can help keep your lungs clear and active. This tool measures how well you are filling your lungs with each breath. Taking long deep breaths may help reverse or decrease the chance of developing breathing (pulmonary) problems (especially infection) following: A long period of time when you are unable to move or be active. BEFORE THE PROCEDURE  If the spirometer includes an indicator to show your best effort, your nurse or respiratory therapist will set it to a desired goal. If possible, sit up straight or lean slightly forward. Try not to slouch. Hold the incentive spirometer in an upright position. INSTRUCTIONS FOR USE  Sit on the edge of your bed if possible, or sit up as far as you can in bed or on a chair. Hold the incentive spirometer in an upright position. Breathe out normally. Place the mouthpiece in your mouth and seal your lips tightly around it. Breathe in slowly and as deeply as possible, raising the piston or the ball toward the top of the column. Hold your breath for 3-5 seconds or for as long as possible. Allow the piston or ball to fall to the bottom of the column. Remove the mouthpiece from your mouth and breathe out normally. Rest for a few seconds and repeat Steps 1 through 7 at least 10 times every 1-2 hours when you are awake. Take your time and take a few  normal breaths between deep breaths. The spirometer may include an indicator to show your best effort. Use the indicator as a goal to work toward during each repetition. After each set of 10 deep breaths, practice coughing to be sure your lungs are clear. If you have an incision (the cut made at the time of surgery), support your incision when coughing by placing a pillow or rolled up towels firmly against it. Once you are able to get out of bed, walk around indoors and cough well. You may stop using the incentive spirometer when instructed by your caregiver.  RISKS AND COMPLICATIONS Take your time so you do not get dizzy or light-headed. If you are in pain, you may need to take or ask for pain medication before doing incentive spirometry. It is harder to take a deep breath if you are having pain. AFTER USE Rest and breathe slowly and easily. It can be helpful to keep track of a log of your progress. Your caregiver can provide you with a simple table to help with this. If you are using the spirometer at home, follow these instructions: [place] IF:  You are having difficultly using the spirometer. You have trouble using the spirometer as often as instructed. Your pain medication is not giving enough relief while using the spirometer. You develop fever of 100.5 F (38.1 C) or higher. SEEK IMMEDIATE MEDICAL CARE IF:  You cough up bloody sputum that had not been present before. You develop fever of 102 F (38.9 C) or greater. You develop worsening pain at or near the incision site. MAKE SURE YOU:  Understand these instructions. Will watch your condition. Will get help right away if you are not doing well or get worse. Document Released: 11/06/2006 Document Revised: 09/18/2011 Document Reviewed: 01/07/2007 [facility] Patient Information 2014 [place], [place].  ________________________________________________________________________  [facility]- Preparing for Total Shoulder  Arthroplasty    Before surgery, you can play an important role. Because skin is not sterile, your skin needs to be as free of germs as possible. You can reduce the number of germs on your skin by using the following products. Benzoyl Peroxide Gel Reduces the number of germs present on the skin Applied twice a day to shoulder area starting two days before surgery    ==================================================================  Please follow these instructions carefully:  BENZOYL PEROXIDE 5% GEL  Please do not use if you have an allergy to benzoyl peroxide.   If your skin becomes reddened/irritated stop using the benzoyl peroxide.  Starting two days before surgery, apply as follows: Apply benzoyl peroxide in the morning and at night. Apply after taking a shower. If you are not taking a shower clean entire shoulder front, back, and side along with the armpit with a clean wet washcloth.  Place a quarter-sized dollop on your shoulder and rub in thoroughly, making sure to cover the front, back, and side of your shoulder, along with the armpit.   2 days before ____ AM   ____ PM              1 day before ____ AM   ____ PM                         Do this twice a day for two days.  (Last application is the night before surgery, AFTER using the CHG soap as described below).  Do NOT apply benzoyl peroxide gel on the day of surgery.

## 2022-03-09 NOTE — Progress Notes (Addendum)
COVID Vaccine Completed: yes x2  Date of COVID positive in last 90 days: no  PCP - Park Liter, DO Cardiologist - n/a   Chest x-ray - 08/22/21 [facility] EKG - 08/19/21 req [facility] Stress Test - yes 10 years ago per pt ECHO - 08/20/21 [facility] Cardiac Cath - n/a Pacemaker/ICD device last checked: n/a Spinal Cord Stimulator: n/a  Bowel Prep - no  Sleep Study - yes, positive CPAP - no  Fasting Blood Sugar - pre DM no checks at home Checks Blood Sugar _____ times a day  Blood Thinner Instructions: n/a Aspirin Instructions:  Last Dose:  Activity level: Can go up a flight of stairs and perform activities of daily living without stopping and without symptoms of chest pain or shortness of breath.   Anesthesia review: Open post surgical wound to left breast. Half dime sized. No signs of infection. Patient reported her plastic surgeons is aware. Dr. Susie Cassette office was notified today.  Patient denies shortness of breath, fever, cough and chest pain at PAT appointment  Patient verbalized understanding of instructions that were given to them at the PAT appointment. Patient was also instructed that they will need to review over the PAT instructions again at home before surgery.

## 2022-03-09 NOTE — Telephone Encounter (Signed)
Pt could not do the dates offered on 9/5 or 9/7. She said she will try to wait until after her surgery

## 2022-03-09 NOTE — Telephone Encounter (Signed)
Attn:

## 2022-03-09 NOTE — Telephone Encounter (Signed)
Yes ok to schedule.

## 2022-03-10 ENCOUNTER — Encounter (HOSPITAL_COMMUNITY): Payer: Self-pay

## 2022-03-10 ENCOUNTER — Encounter (HOSPITAL_COMMUNITY)
Admission: RE | Admit: 2022-03-10 | Discharge: 2022-03-10 | Disposition: A | Discharge status: Home / Self Care | Payer: Managed Care, Other (non HMO) | Source: Ambulatory Visit | Attending: Orthopedic Surgery | Admitting: Orthopedic Surgery

## 2022-03-10 VITALS — BP 117/77 | HR 72 | Temp 98.7°F | Resp 16 | Ht 64.0 in | Wt 197.0 lb

## 2022-03-10 DIAGNOSIS — Z01812 Encounter for preprocedural laboratory examination: Secondary | ICD-10-CM | POA: Insufficient documentation

## 2022-03-10 DIAGNOSIS — Z01818 Encounter for other preprocedural examination: Secondary | ICD-10-CM

## 2022-03-10 DIAGNOSIS — E1149 Type 2 diabetes mellitus with other diabetic neurological complication: Secondary | ICD-10-CM | POA: Insufficient documentation

## 2022-03-10 LAB — BASIC METABOLIC PANEL
Anion gap: 6 (ref 5–15)
BUN: 18 mg/dL (ref 6–20)
CO2: 28 mmol/L (ref 22–32)
Calcium: 9.8 mg/dL (ref 8.9–10.3)
Chloride: 105 mmol/L (ref 98–111)
Creatinine, Ser: 0.86 mg/dL (ref 0.44–1.00)
GFR, Estimated: >60 mL/min (ref >60)
Glucose, Bld: 108 mg/dL — ABNORMAL HIGH (ref 70–99)
Potassium: 4.7 mmol/L (ref 3.5–5.1)
Sodium: 139 mmol/L (ref 135–145)

## 2022-03-10 LAB — CBC
HCT: 41.2 % (ref 36.0–46.0)
Hemoglobin: 13.2 g/dL (ref 12.0–15.0)
MCH: 28.9 pg (ref 26.0–34.0)
MCHC: 32 g/dL (ref 30.0–36.0)
MCV: 90.2 fL (ref 80.0–100.0)
Platelets: 316 10*3/uL (ref 150–400)
RBC: 4.57 MIL/uL (ref 3.87–5.11)
RDW: 13.6 % (ref 11.5–15.5)
WBC: 7.9 10*3/uL (ref 4.0–10.5)
nRBC: 0 % (ref 0.0–0.2)

## 2022-03-10 LAB — SURGICAL PCR SCREEN
MRSA, PCR: NEGATIVE
Staphylococcus aureus: NEGATIVE

## 2022-03-10 LAB — GLUCOSE, CAPILLARY: Glucose-Capillary: 104 mg/dL — ABNORMAL HIGH (ref 70–99)

## 2022-03-14 LAB — HM DIABETES EYE EXAM

## 2022-03-16 ENCOUNTER — Other Ambulatory Visit: Payer: Self-pay

## 2022-03-16 ENCOUNTER — Encounter (HOSPITAL_COMMUNITY): Payer: Self-pay | Admitting: Orthopedic Surgery

## 2022-03-16 ENCOUNTER — Encounter (HOSPITAL_COMMUNITY)
Admission: RE | Disposition: A | Discharge status: Home / Self Care | Payer: Self-pay | Source: Home / Self Care | Attending: Orthopedic Surgery

## 2022-03-16 ENCOUNTER — Ambulatory Visit (HOSPITAL_COMMUNITY): Payer: Managed Care, Other (non HMO) | Admitting: Physician Assistant

## 2022-03-16 ENCOUNTER — Ambulatory Visit (HOSPITAL_COMMUNITY): Payer: Managed Care, Other (non HMO) | Admitting: Anesthesiology

## 2022-03-16 ENCOUNTER — Ambulatory Visit (HOSPITAL_COMMUNITY)
Admission: RE | Admit: 2022-03-16 | Discharge: 2022-03-16 | Disposition: A | Discharge status: Home / Self Care | Payer: Managed Care, Other (non HMO) | Attending: Orthopedic Surgery | Admitting: Orthopedic Surgery

## 2022-03-16 DIAGNOSIS — E119 Type 2 diabetes mellitus without complications: Secondary | ICD-10-CM

## 2022-03-16 DIAGNOSIS — F419 Anxiety disorder, unspecified: Secondary | ICD-10-CM | POA: Insufficient documentation

## 2022-03-16 DIAGNOSIS — E669 Obesity, unspecified: Secondary | ICD-10-CM | POA: Diagnosis not present

## 2022-03-16 DIAGNOSIS — G4730 Sleep apnea, unspecified: Secondary | ICD-10-CM | POA: Diagnosis not present

## 2022-03-16 DIAGNOSIS — M19012 Primary osteoarthritis, left shoulder: Secondary | ICD-10-CM

## 2022-03-16 DIAGNOSIS — K219 Gastro-esophageal reflux disease without esophagitis: Secondary | ICD-10-CM | POA: Insufficient documentation

## 2022-03-16 DIAGNOSIS — Z6833 Body mass index (BMI) 33.0-33.9, adult: Secondary | ICD-10-CM | POA: Insufficient documentation

## 2022-03-16 DIAGNOSIS — Z87891 Personal history of nicotine dependence: Secondary | ICD-10-CM | POA: Diagnosis not present

## 2022-03-16 DIAGNOSIS — Z7985 Long-term (current) use of injectable non-insulin antidiabetic drugs: Secondary | ICD-10-CM

## 2022-03-16 DIAGNOSIS — E0590 Thyrotoxicosis, unspecified without thyrotoxic crisis or storm: Secondary | ICD-10-CM | POA: Insufficient documentation

## 2022-03-16 DIAGNOSIS — E1149 Type 2 diabetes mellitus with other diabetic neurological complication: Secondary | ICD-10-CM

## 2022-03-16 DIAGNOSIS — F319 Bipolar disorder, unspecified: Secondary | ICD-10-CM | POA: Diagnosis not present

## 2022-03-16 HISTORY — PX: TOTAL SHOULDER ARTHROPLASTY: SHX126

## 2022-03-16 LAB — GLUCOSE, CAPILLARY: Glucose-Capillary: 115 mg/dL — ABNORMAL HIGH (ref 70–99)

## 2022-03-16 SURGERY — ARTHROPLASTY, SHOULDER, TOTAL
Anesthesia: General | Site: Shoulder | Laterality: Left

## 2022-03-16 MED ORDER — OXYCODONE HCL 5 MG PO TABS
5.0000 mg | ORAL_TABLET | Freq: Once | ORAL | Status: AC | PRN
  Administered 2022-03-16: 5 mg via ORAL

## 2022-03-16 MED ORDER — DICLOFENAC SODIUM 75 MG PO TBEC: 75.0000 mg | DELAYED_RELEASE_TABLET | Freq: Two times a day (BID) | ORAL | 1 refills | Status: DC

## 2022-03-16 MED ORDER — TRANEXAMIC ACID-NACL 1000-0.7 MG/100ML-% IV SOLN
1000.0000 mg | INTRAVENOUS | Status: AC
  Administered 2022-03-16: 1000 mg via INTRAVENOUS
  Filled 2022-03-16: qty 100

## 2022-03-16 MED ORDER — VANCOMYCIN HCL 1000 MG IV SOLR
INTRAVENOUS | Status: DC | PRN
  Administered 2022-03-16: 1000 mg via TOPICAL

## 2022-03-16 MED ORDER — PHENYLEPHRINE HCL (PRESSORS) 10 MG/ML IV SOLN
INTRAVENOUS | Status: AC
  Filled 2022-03-16: qty 1

## 2022-03-16 MED ORDER — ACETAMINOPHEN 500 MG PO TABS: 1000.0000 mg | ORAL_TABLET | Freq: Once | ORAL | Status: DC

## 2022-03-16 MED ORDER — PROPOFOL 10 MG/ML IV BOLUS
INTRAVENOUS | Status: DC | PRN
  Administered 2022-03-16: 160 mg via INTRAVENOUS

## 2022-03-16 MED ORDER — ORAL CARE MOUTH RINSE: 15.0000 mL | Freq: Once | OROMUCOSAL | Status: AC

## 2022-03-16 MED ORDER — FENTANYL CITRATE PF 50 MCG/ML IJ SOSY
25.0000 ug | PREFILLED_SYRINGE | INTRAMUSCULAR | Status: DC | PRN
  Administered 2022-03-16: 50 ug via INTRAVENOUS

## 2022-03-16 MED ORDER — ACETAMINOPHEN 325 MG PO TABS
ORAL_TABLET | ORAL | Status: AC
  Filled 2022-03-16: qty 2

## 2022-03-16 MED ORDER — EPHEDRINE 5 MG/ML INJ
INTRAVENOUS | Status: AC
  Filled 2022-03-16: qty 5

## 2022-03-16 MED ORDER — 0.9 % SODIUM CHLORIDE (POUR BTL) OPTIME
TOPICAL | Status: DC | PRN
  Administered 2022-03-16: 1000 mL

## 2022-03-16 MED ORDER — BUPIVACAINE HCL (PF) 0.5 % IJ SOLN
INTRAMUSCULAR | Status: DC | PRN
  Administered 2022-03-16: 15 mL via PERINEURAL

## 2022-03-16 MED ORDER — DEXAMETHASONE SODIUM PHOSPHATE 10 MG/ML IJ SOLN
INTRAMUSCULAR | Status: DC | PRN
  Administered 2022-03-16: 10 mg via INTRAVENOUS

## 2022-03-16 MED ORDER — CHLORHEXIDINE GLUCONATE 0.12 % MT SOLN
15.0000 mL | Freq: Once | OROMUCOSAL | Status: AC
  Administered 2022-03-16: 15 mL via OROMUCOSAL

## 2022-03-16 MED ORDER — EPHEDRINE SULFATE-NACL 50-0.9 MG/10ML-% IV SOSY
PREFILLED_SYRINGE | INTRAVENOUS | Status: DC | PRN
  Administered 2022-03-16 (×3): 5 mg via INTRAVENOUS

## 2022-03-16 MED ORDER — STERILE WATER FOR IRRIGATION IR SOLN
Status: DC | PRN
  Administered 2022-03-16: 2000 mL

## 2022-03-16 MED ORDER — PHENYLEPHRINE HCL-NACL 20-0.9 MG/250ML-% IV SOLN
INTRAVENOUS | Status: DC | PRN
  Administered 2022-03-16: 25 ug/min via INTRAVENOUS

## 2022-03-16 MED ORDER — ONDANSETRON HCL 4 MG/2ML IJ SOLN: 4.0000 mg | Freq: Once | INTRAMUSCULAR | Status: DC | PRN

## 2022-03-16 MED ORDER — ROCURONIUM BROMIDE 10 MG/ML (PF) SYRINGE
PREFILLED_SYRINGE | INTRAVENOUS | Status: DC | PRN
  Administered 2022-03-16: 60 mg via INTRAVENOUS

## 2022-03-16 MED ORDER — BUPIVACAINE LIPOSOME 1.3 % IJ SUSP
INTRAMUSCULAR | Status: DC | PRN
  Administered 2022-03-16: 10 mL via PERINEURAL

## 2022-03-16 MED ORDER — CEFAZOLIN SODIUM-DEXTROSE 2-4 GM/100ML-% IV SOLN
2.0000 g | INTRAVENOUS | Status: AC
  Administered 2022-03-16: 2 g via INTRAVENOUS
  Filled 2022-03-16: qty 100

## 2022-03-16 MED ORDER — SUGAMMADEX SODIUM 200 MG/2ML IV SOLN
INTRAVENOUS | Status: DC | PRN
  Administered 2022-03-16: 200 mg via INTRAVENOUS

## 2022-03-16 MED ORDER — MIDAZOLAM HCL 2 MG/2ML IJ SOLN
2.0000 mg | INTRAMUSCULAR | Status: DC
  Administered 2022-03-16: 2 mg via INTRAVENOUS
  Filled 2022-03-16: qty 2

## 2022-03-16 MED ORDER — ONDANSETRON HCL 4 MG PO TABS: 4.0000 mg | ORAL_TABLET | Freq: Three times a day (TID) | ORAL | 0 refills | Status: DC | PRN

## 2022-03-16 MED ORDER — FENTANYL CITRATE PF 50 MCG/ML IJ SOSY
100.0000 ug | PREFILLED_SYRINGE | INTRAMUSCULAR | Status: DC
  Administered 2022-03-16: 100 ug via INTRAVENOUS
  Filled 2022-03-16: qty 2

## 2022-03-16 MED ORDER — ACETAMINOPHEN 160 MG/5ML PO SOLN: 325.0000 mg | ORAL | Status: DC | PRN

## 2022-03-16 MED ORDER — ONDANSETRON HCL 4 MG PO TABS: 4.0000 mg | ORAL_TABLET | Freq: Four times a day (QID) | ORAL | Status: DC | PRN

## 2022-03-16 MED ORDER — CYCLOBENZAPRINE HCL 10 MG PO TABS: 10.0000 mg | ORAL_TABLET | Freq: Three times a day (TID) | ORAL | 1 refills | Status: DC | PRN

## 2022-03-16 MED ORDER — FENTANYL CITRATE PF 50 MCG/ML IJ SOSY
PREFILLED_SYRINGE | INTRAMUSCULAR | Status: AC
  Filled 2022-03-16: qty 1

## 2022-03-16 MED ORDER — LACTATED RINGERS IV SOLN: INTRAVENOUS | Status: DC

## 2022-03-16 MED ORDER — OXYCODONE HCL 5 MG PO TABS
ORAL_TABLET | ORAL | Status: AC
  Filled 2022-03-16: qty 1

## 2022-03-16 MED ORDER — METOCLOPRAMIDE HCL 5 MG/ML IJ SOLN: 5.0000 mg | Freq: Three times a day (TID) | INTRAMUSCULAR | Status: DC | PRN

## 2022-03-16 MED ORDER — OXYCODONE-ACETAMINOPHEN 5-325 MG PO TABS: 1.0000 | ORAL_TABLET | ORAL | 0 refills | Status: DC | PRN

## 2022-03-16 MED ORDER — LIDOCAINE 2% (20 MG/ML) 5 ML SYRINGE
INTRAMUSCULAR | Status: DC | PRN
  Administered 2022-03-16: 60 mg via INTRAVENOUS

## 2022-03-16 MED ORDER — ACETAMINOPHEN 325 MG PO TABS
325.0000 mg | ORAL_TABLET | ORAL | Status: DC | PRN
  Administered 2022-03-16: 650 mg via ORAL

## 2022-03-16 MED ORDER — ONDANSETRON HCL 4 MG/2ML IJ SOLN: 4.0000 mg | Freq: Four times a day (QID) | INTRAMUSCULAR | Status: DC | PRN

## 2022-03-16 MED ORDER — LACTATED RINGERS IV BOLUS
500.0000 mL | Freq: Once | INTRAVENOUS | Status: AC
  Administered 2022-03-16: 500 mL via INTRAVENOUS

## 2022-03-16 MED ORDER — ONDANSETRON HCL 4 MG/2ML IJ SOLN
INTRAMUSCULAR | Status: DC | PRN
  Administered 2022-03-16: 4 mg via INTRAVENOUS

## 2022-03-16 MED ORDER — MEPERIDINE HCL 50 MG/ML IJ SOLN: 6.2500 mg | INTRAMUSCULAR | Status: DC | PRN

## 2022-03-16 MED ORDER — METOCLOPRAMIDE HCL 5 MG PO TABS: 5.0000 mg | ORAL_TABLET | Freq: Three times a day (TID) | ORAL | Status: DC | PRN

## 2022-03-16 MED ORDER — LACTATED RINGERS IV BOLUS
250.0000 mL | Freq: Once | INTRAVENOUS | Status: AC
  Administered 2022-03-16: 250 mL via INTRAVENOUS

## 2022-03-16 MED ORDER — VANCOMYCIN HCL 1000 MG IV SOLR
INTRAVENOUS | Status: AC
  Filled 2022-03-16: qty 20

## 2022-03-16 MED ORDER — OXYCODONE HCL 5 MG/5ML PO SOLN: 5.0000 mg | Freq: Once | ORAL | Status: AC | PRN

## 2022-03-16 SURGICAL SUPPLY — 72 items
ADH SKN CLS APL DERMABOND .7 (GAUZE/BANDAGES/DRESSINGS) ×1
AID PSTN UNV HD RSTRNT DISP (MISCELLANEOUS) ×1
BAG COUNTER SPONGE SURGICOUNT (BAG) IMPLANT
BAG SPEC THK2 15X12 ZIP CLS (MISCELLANEOUS) ×1
BAG SPNG CNTER NS LX DISP (BAG) ×1
BAG ZIPLOCK 12X15 (MISCELLANEOUS) ×2 IMPLANT
BLADE SAW SGTL 83.5X18.5 (BLADE) ×2 IMPLANT
BODY TRUNION ECLIPSE 39 SL (Shoulder) IMPLANT
CEMENT BONE DEPUY (Cement) ×2 IMPLANT
COOLER ICEMAN CLASSIC (MISCELLANEOUS) ×2 IMPLANT
COVER BACK TABLE 60X90IN (DRAPES) ×2 IMPLANT
COVER SURGICAL LIGHT HANDLE (MISCELLANEOUS) ×2 IMPLANT
DERMABOND ADVANCED (GAUZE/BANDAGES/DRESSINGS) ×1
DERMABOND ADVANCED .7 DNX12 (GAUZE/BANDAGES/DRESSINGS) ×2 IMPLANT
DRAPE ORTHO SPLIT 77X108 STRL (DRAPES) ×2
DRAPE SHEET LG 3/4 BI-LAMINATE (DRAPES) ×2 IMPLANT
DRAPE SURG 17X11 SM STRL (DRAPES) ×2 IMPLANT
DRAPE SURG ORHT 6 SPLT 77X108 (DRAPES) ×4 IMPLANT
DRAPE TOP 10253 STERILE (DRAPES) ×2 IMPLANT
DRAPE U-SHAPE 47X51 STRL (DRAPES) ×2 IMPLANT
DRSG AQUACEL AG ADV 3.5X 6 (GAUZE/BANDAGES/DRESSINGS) IMPLANT
DRSG AQUACEL AG ADV 3.5X10 (GAUZE/BANDAGES/DRESSINGS) ×2 IMPLANT
DURAPREP 26ML APPLICATOR (WOUND CARE) ×2 IMPLANT
ELECT BLADE TIP CTD 4 INCH (ELECTRODE) ×2 IMPLANT
ELECT PENCIL ROCKER SW 15FT (MISCELLANEOUS) ×2 IMPLANT
ELECT REM PT RETURN 15FT ADLT (MISCELLANEOUS) ×2 IMPLANT
FACESHIELD WRAPAROUND (MASK) ×4 IMPLANT
FACESHIELD WRAPAROUND OR TEAM (MASK) ×8 IMPLANT
GLENOID WITH CLEAT SM (Miscellaneous) IMPLANT
GLOVE BIO SURGEON STRL SZ7.5 (GLOVE) ×2 IMPLANT
GLOVE BIO SURGEON STRL SZ8 (GLOVE) ×2 IMPLANT
GLOVE BIOGEL M 6.5 STRL (GLOVE) IMPLANT
GLOVE BIOGEL PI IND STRL 6.5 (GLOVE) IMPLANT
GLOVE SS BIOGEL STRL SZ 7 (GLOVE) ×2 IMPLANT
GLOVE SS BIOGEL STRL SZ 7.5 (GLOVE) ×2 IMPLANT
GOWN STRL REIN XL XLG (GOWN DISPOSABLE) ×4 IMPLANT
HEAD HUMERAL ECLIPSE 39/16 (Shoulder) IMPLANT
IMPL ECLIPSE SPEEDCAP (Shoulder) IMPLANT
IMPLANT ECLIPSE SPEEDCAP (Shoulder) ×1 IMPLANT
KIT BASIN OR (CUSTOM PROCEDURE TRAY) ×2 IMPLANT
KIT TURNOVER KIT A (KITS) IMPLANT
MANIFOLD NEPTUNE II (INSTRUMENTS) ×2 IMPLANT
NDL TAPERED W/ NITINOL LOOP (MISCELLANEOUS) IMPLANT
NEEDLE TAPERED W/ NITINOL LOOP (MISCELLANEOUS) ×1 IMPLANT
NS IRRIG 1000ML POUR BTL (IV SOLUTION) ×2 IMPLANT
PACK SHOULDER (CUSTOM PROCEDURE TRAY) ×2 IMPLANT
PAD COLD SHLDR WRAP-ON (PAD) ×2 IMPLANT
PIN NITINOL TARGETER 2.8 (PIN) IMPLANT
PIN SET MODULAR GLENOID SYSTEM (PIN) IMPLANT
PROTECTOR NERVE ULNAR (MISCELLANEOUS) ×2 IMPLANT
RESTRAINT HEAD UNIVERSAL NS (MISCELLANEOUS) ×2 IMPLANT
SCREW MED ECLIPSE 35 (Screw) IMPLANT
SIZER ECLIPSE CAGE SCREW (ORTHOPEDIC DISPOSABLE SUPPLIES) IMPLANT
SLING ARM FOAM STRAP LRG (SOFTGOODS) IMPLANT
SLING ARM FOAM STRAP MED (SOFTGOODS) IMPLANT
SMARTMIX MINI TOWER (MISCELLANEOUS) ×1
SPONGE T-LAP 4X18 ~~LOC~~+RFID (SPONGE) IMPLANT
SUCTION FRAZIER HANDLE 12FR (TUBING) ×1
SUCTION TUBE FRAZIER 12FR DISP (TUBING) ×2 IMPLANT
SUT FIBERWIRE #2 38 T-5 BLUE (SUTURE)
SUT MNCRL AB 3-0 PS2 18 (SUTURE) ×2 IMPLANT
SUT MON AB 2-0 CT1 36 (SUTURE) ×2 IMPLANT
SUT VIC AB 1 CT1 36 (SUTURE) ×2 IMPLANT
SUTURE FIBERWR #2 38 T-5 BLUE (SUTURE) IMPLANT
SUTURE TAPE 1.3 40 TPR END (SUTURE) IMPLANT
SUTURETAPE 1.3 40 TPR END (SUTURE) ×1
TOWEL OR 17X26 10 PK STRL BLUE (TOWEL DISPOSABLE) ×2 IMPLANT
TOWEL OR NON WOVEN STRL DISP B (DISPOSABLE) ×2 IMPLANT
TOWER SMARTMIX MINI (MISCELLANEOUS) IMPLANT
TUBE SUCTION HIGH CAP CLEAR NV (SUCTIONS) ×2 IMPLANT
WATER STERILE IRR 1000ML POUR (IV SOLUTION) ×4 IMPLANT
YANKAUER SUCT BULB TIP 10FT TU (MISCELLANEOUS) ×2 IMPLANT

## 2022-03-16 NOTE — Discharge Instructions (Signed)

## 2022-03-16 NOTE — Op Note (Signed)
03/16/2022  11:42 AM  PATIENT:   Carol Becker  61 y.o. female  PRE-OPERATIVE DIAGNOSIS:  Left shoulder osteoarthritis  POST-OPERATIVE DIAGNOSIS: Same  PROCEDURE: Left shoulder anatomic arthroplasty lysing an Arthrex stemless implant size 39 trunnion, medium cage screw, 39 x 16 humeral head, small glenoid  SURGEON:  Caili Escalera, Metta Clines M.D.  ASSISTANTS: Jenetta Loges, PA-C Jenetta Loges, PA-C was utilized as an Environmental consultant throughout this case, essential for help with positioning the patient, positioning extremity, tissue manipulation, implantation of the prosthesis, suture management, wound closure, and intraoperative decision-making.  ANESTHESIA:   General endotracheal and interscalene block with Exparel  EBL: 150 cc  SPECIMEN: None  Drains: None   PATIENT DISPOSITION:  PACU - hemodynamically stable.    PLAN OF CARE: Discharge to home after PACU  Brief history:  Patient is a 61 year old female who has had chronic and progressive increasing left shoulder pain related to severe osteoarthritis.  Due to her increasing functional limitations and failure to respond to prolonged attempts at conservative management, she is brought to the operating room this time for planned left shoulder anatomic arthroplasty.  Preoperatively, I counseled the patient regarding treatment options and risks versus benefits thereof.  Possible surgical complications were all reviewed including potential for bleeding, infection, neurovascular injury, persistent pain, loss of motion, anesthetic complication, failure of the implant, and possible need for additional surgery. They understand and accept and agrees with our planned procedure.   Procedure detail:  After undergoing routine preop evaluation the patient received prophylactic antibiotics and interscalene block with Exparel was established in the holding area by the anesthesia department.  Patient subsequently placed spine on the operating table and  underwent the smooth induction of the general endotracheal anesthesia.  Placed into the beachchair position and appropriately padded and protected.  The left shoulder girdle region was sterilely prepped and draped in standard fashion.  Timeout was called.  A deltopectoral approach left shoulder is made an approximately 8 cm incision.  Skin flaps were elevated dissection carried deeply and the deltopectoral interval was developed from proximal to distal with the vein taken laterally.  Conjoined tendon was mobilized and retracted medially.  The long head biceps tendon was tenodesed at the upper border the pectoralis major tendon with proximal segment unroofed and excised.  The rotator cuff was then split from the apex of the bicipital groove to the base of the coracoid and we then identified the insertion of the subscapularis into the lesser tuberosity both superiorly and inferiorly and with this we then proceeded with a lesser tuberosity osteotomy using an oscillating saw removing a thin wafer of bone.  Subscapularis was then tagged and mobilized and reflected medially and capsular attachments on the anterior and infra margins of the humeral neck were then divided in a subperiosteal fashion allowing delivery of the humeral head through the wound.  An extra medullary guide was then used to outline a proposed humeral head resection which we performed with an oscillating saw at approximately 20 degrees retroversion.  A rondure was then used to remove the marginal osteophytes and the metaphysis was then sized at a 39 trunnion.  The metaphysis was prepared with the coring reamer and then measured for a medium cage screw.  A metal cap was then placed over the cut proximal humeral surface we then exposed the glenoid using appropriate retractors and performed a circumferential labral resection.  This was measured at a size small glenoid and a guidepin was directed into the center of the glenoid  and this was then reamed to a  stable subchondral bony bed.  Preparation completed with the central drill followed by the placement the superior and inferior peg and slot respectively and the glenoid was then broached and a trial fit showed excellent positioning and fixation.  The trial was removed.  The glenoid was cleaned and dried.  Cement was mixed and introduced into the superior and inferior peg and slot and then morselized bone graft placed around the central peg of the glenoid and the glenoid was then impacted achieving excellent purchase and fixation.  We then returned our attention to the metaphysis where we confirmed good quality bone for a stemless implant.  Our size 39 trunnion was then placed over the centering sleeve with a suture tape suture passed through the eyelet on the collar of the trunnion.  The medium cage screw was then packed with bone graft and then inserted with excellent purchase and fixation.  We then performed a series of trial reductions and ultimately felt that the 39 x 16 head gave is the best motion stability and soft tissue balance with approximately 50% translation of the humeral head of the glenoid.  This point the trial was removed.  We placed 2 additional medial row anchors for ultimate repair of R LTO.  We then cleaned the implant and impacted the final size 39 x 16 head.  Final reduction was then performed which again showed excellent motion stability and soft tissue balance all much to our satisfaction.  This point our 3 medial row suture limbs were then passed through the bone tendon junction of the LTO and they were then passed in an alternating fashion into 2 lateral anchors which were placed into the bicipital groove which allowed excellent fixation of the LTO with excellent stability.  The rotator cuff interval was then repaired with a series of figure-of-eight suture tape sutures.  Upon completion the arm easily achieved 45 degrees of external rotation without excessive tension on the subscap  repair.  Final irrigation was then completed.  Hemostasis was obtained.  Vancomycin powder was then spread liberally throughout the deep soft tissue planes.  The deltopectoral interval was reapproximated a series of figure-of-eight number Vicryl sutures.  2-0 Monocryl used to close the subcu layer and intracuticular 3-0 Monocryl for the skin followed by Dermabond and Aquacel dressing.  The left arm placed into a sling and the patient was awakened, extubated, and taken to the recovery room in stable condition.  Marin Shutter MD   Contact # [phone]

## 2022-03-16 NOTE — Anesthesia Postprocedure Evaluation (Signed)
Anesthesia Post Note  Patient: Carol Becker  Procedure(s) Performed: TOTAL SHOULDER ARTHROPLASTY (Left: Shoulder)     Patient location during evaluation: PACU Anesthesia Type: General Level of consciousness: awake and alert Pain management: pain level controlled Vital Signs Assessment: post-procedure vital signs reviewed and stable Respiratory status: spontaneous breathing, nonlabored ventilation, respiratory function stable and patient connected to nasal cannula oxygen Cardiovascular status: blood pressure returned to baseline and stable Postop Assessment: no apparent nausea or vomiting Anesthetic complications: yes   Encounter Notable Events  Notable Event Outcome Phase Comment  Difficult to intubate - expected  Intraprocedure Filed from anesthesia note documentation.    Last Vitals:  Vitals:   03/16/22 1200 03/16/22 1215  BP: 136/83 123/64  Pulse: 85 80  Resp: (!) 23 18  Temp:  36.7 C  SpO2: 100% 98%    Last Pain:  Vitals:   03/16/22 1215  TempSrc:   PainSc: 5                  [name]

## 2022-03-16 NOTE — H&P (Signed)
Carol Becker    Chief Complaint: Left shoulder osteoarthritis HPI: The patient is a 61 y.o. female with chronic and progressively increasing left shoulder pain related to severe osteoarthritis.  Due to her increasing functional limitations and failure to respond to prolonged attempts at conservative management, she is brought to the operating room this time for planned left shoulder anatomic arthroplasty  Past Medical History:  Diagnosis Date   Adrenal nodule (HCC)    Anxiety    Arthritis    knees, left thumb, shoulders   Breast cancer ([place])    Left   Cervical spinal stenosis    Family history of breast cancer    GERD (gastroesophageal reflux disease)    History of hypothyroidism    Hyperlipidemia    poss history of   Hyperthyroidism    Osteopenia    PONV (postoperative nausea and vomiting)    after wisdom teeth   Pre-diabetes    Restless leg syndrome    Sleep apnea    Tremors of nervous system    Vertigo    Vestibular migraine       Past Surgical History:  Procedure Laterality Date   ANTERIOR / POSTERIOR COMBINED FUSION CERVICAL SPINE     BREAST BIOPSY Left 03/15/2021   Stereo bx, Ribbon Clip, path pending   BREAST LUMPECTOMY Left    COLONOSCOPY WITH PROPOFOL N/A 11/05/2020   Procedure: COLONOSCOPY WITH PROPOFOL;  Surgeon: Lucilla Lame, MD;  Location: [facility];  Service: Endoscopy;  Laterality: N/A;   CYSTECTOMY Left    KNEE ARTHROSCOPY WITH EXCISION BAKER'S CYST Left    LAPAROSCOPY     Exploratory   POLYPECTOMY  11/05/2020   Procedure: POLYPECTOMY;  Surgeon: Lucilla Lame, MD;  Location: [facility];  Service: Endoscopy;;   SHOULDER ARTHROSCOPY WITH ROTATOR CUFF REPAIR Left 12/01/2021   Procedure: Left shoulder arthroscopy, debridement, subacromial decompression, distal clavicle resection, chondroplasty;  Surgeon: Justice Britain, MD;  Location: [facility];  Service: Orthopedics;  Laterality: Left;  28min   TOTAL MASTECTOMY Bilateral     trapeziectomy Left    done at [facility]/16/20   TUBAL LIGATION     WISDOM TOOTH EXTRACTION      Family History  Problem Relation Age of Onset   Heart disease Father        CAD   Heart disease Sister 66       CAD   Parkinson's disease Sister    Cancer Maternal Grandmother        breast   Breast cancer Maternal Grandmother 27   Alzheimer's disease Maternal Grandfather    Cancer Paternal Grandmother        breast   Breast cancer Paternal Grandmother 80   Bipolar disorder Daughter    Heart disease Cousin    Breast cancer Cousin        dx 30s-40s, d. 63s-40s    Social History:  reports that she quit smoking about 10 years ago. Her smoking use included cigarettes. She has never used smokeless tobacco. She reports that she does not currently use alcohol. She reports that she does not use drugs.  BMI: Estimated body mass index is 33.82 kg/m as calculated from the following:   Height as of this encounter: 5\' 4"  (1.626 m).   Weight as of this encounter: 89.4 kg.  Lab Results  Component Value Date   ALBUMIN 4.4 11/18/2021   Diabetes:   Patient has a diagnosis of diabetes,  Lab Results  Component Value Date  HGBA1C 5.5 02/24/2022   Smoking Status: Social History   Tobacco Use  Smoking Status Former   Types: Cigarettes   Quit date: 04/06/2011   Years since quitting: 10.9  Smokeless Tobacco Never   The patient is not currently a tobacco user. Counseling given: Not Answered      Medications Prior to Admission  Medication Sig Dispense Refill   acetaminophen (TYLENOL) 500 MG tablet Take 1,000 mg by mouth every 6 (six) hours as needed for mild pain.     busPIRone (BUSPAR) 15 MG tablet TAKE 1 TO 2 TABLETS BY MOUTH THREE TIMES DAILY (Patient taking differently: Take 15-30 mg by mouth 3 (three) times daily.) 270 tablet 0   clonazePAM (KLONOPIN) 0.5 MG tablet Take 0.25 mg by mouth 2 (two) times daily.     gabapentin (NEURONTIN) 600 MG tablet Take 1 pill in the AM and 2 pills in  the PM (Patient taking differently: Take 600-1,200 mg by mouth See admin instructions. Take 1 pill in the AM ( 600 mg)  and 2 pills ( 1200 mg) in the PM) 270 tablet 1   methimazole (TAPAZOLE) 5 MG tablet Take 7.5 mg by mouth daily. Taking 1 & 1/2 tablets daily     nortriptyline (PAMELOR) 50 MG capsule Take 1 capsule (50 mg total) by mouth at bedtime. 60 capsule 11   omeprazole (PRILOSEC) 20 MG capsule Take 20 mg by mouth daily.     Semaglutide, 1 MG/DOSE, 4 MG/3ML SOPN Inject 1 mg as directed once a week. (Patient taking differently: Inject 1 mg as directed once a week. Sunday) 9 mL 1   venlafaxine XR (EFFEXOR-XR) 75 MG 24 hr capsule Take 150 mg by mouth at bedtime.       Physical Exam: Left shoulder demonstrates painful and guarded motion as noted at recent office visits.  She maintains excellent strength and functional mobility but severe pain throughout her range of motion.  Plain radiographs confirm severe osteoarthritis with loss of joint space, subchondral sclerosis, and peripheral osteophyte formation.  Vitals  Temp:  [98.8 F (37.1 C)] 98.8 F (37.1 C) (09/07 0816) Pulse Rate:  [79-90] 87 (09/07 0855) Resp:  [13-16] 13 (09/07 0855) BP: (110-145)/(77-86) 123/80 (09/07 0855) SpO2:  [99 %-100 %] 99 % (09/07 0855) Weight:  [89.4 kg] 89.4 kg (09/07 0834)  Assessment/Plan  Impression: Left shoulder osteoarthritis  Plan of Action: Procedure(s): TOTAL SHOULDER ARTHROPLASTY  Stephen Turnbaugh M Salisa Broz 03/16/2022, 9:28 AM Contact # [phone]

## 2022-03-16 NOTE — Anesthesia Procedure Notes (Signed)
Procedure Name: Intubation Date/Time: 03/16/2022 10:17 AM  Performed by: Gerald Leitz, CRNAPre-anesthesia Checklist: Patient identified, Patient being monitored, Timeout performed, Emergency Drugs available and Suction available Patient Re-evaluated:Patient Re-evaluated prior to induction Oxygen Delivery Method: Circle system utilized Preoxygenation: Pre-oxygenation with 100% oxygen Induction Type: IV induction Ventilation: Mask ventilation without difficulty Laryngoscope Size: Mac and 4 Grade View: Grade II Tube type: Oral Tube size: 7.0 mm Number of attempts: 1 Airway Equipment and Method: Stylet Placement Confirmation: ETT inserted through vocal cords under direct vision, positive ETCO2 and breath sounds checked- equal and bilateral Secured at: 21 cm Tube secured with: Tape Dental Injury: Teeth and Oropharynx as per pre-operative assessment  Difficulty Due To: Difficulty was anticipated and Difficult Airway- due to anterior larynx Comments: Stiff neck

## 2022-03-16 NOTE — Evaluation (Signed)
Occupational Therapy Evaluation Patient Details Name: Carol Becker MRN: [id number] DOB: 08-08-1960 Today's Date: 03/16/2022   History of Present Illness Ms. Simkin is a 61 yr old female who is s/p a L TSA on 03-16-2022, secondary to OA of the shoulder.   Clinical Impression   s/p shoulder replacement without functional use of left non-dominant upper extremity secondary to effects of surgery and interscalene block and shoulder precautions. Therapist provided education and instruction to patient and spouse in regards to exercises, precautions, positioning, donning upper extremity clothing and bathing while maintaining shoulder precautions, ice and edema management and donning/doffing sling. Patient and spouse verbalized understanding and demonstrated as needed. Patient needed assistance to donn dress, underwear, and shoes and provided with instruction on compensatory strategies to perform ADLs. Patient to follow up with MD for further therapy needs.        Recommendations for follow up therapy are one component of a multi-disciplinary discharge planning process, led by the attending physician.  Recommendations may be updated based on patient status, additional functional criteria and insurance authorization.   Follow Up Recommendations  Follow physician's recommendations for discharge plan and follow up therapies    Assistance Recommended at Discharge Intermittent Supervision/Assistance  Patient can return home with the following Assistance with cooking/housework;A little help with bathing/dressing/bathroom    Functional Status Assessment  Patient has had a recent decline in their functional status and demonstrates the ability to make significant improvements in function in a reasonable and predictable amount of time.  Equipment Recommendations       Recommendations for Other Services       Precautions / Restrictions Precautions Precautions: Shoulder Shoulder Interventions: Shoulder  sling/immobilizer Precaution Booklet Issued: Yes (comment) Precaution Comments: May come out of sling if sitting in controlled environment. ie while watching tv, eating etc to give neck and skin break from sling. Please sleep in sling though until 4 weeks post op.     Ok to use operative arm to assist in feeding, bathing, ADL's.       New PROM restrictions (8/18) for use in hygiene and ADL only   ER 20   ABD 45   FE 60     Pendulums are to be gentle and are the preferred exercise to be instructed for patients to perform at home.( Along with elbow wrist and hand exercise) Restrictions Weight Bearing Restrictions: Yes LUE Weight Bearing: Non weight bearing      Mobility Bed Mobility               General bed mobility comments: not assessed Patient Response: Cooperative  Transfers Overall transfer level: Independent   Transfers: Sit to/from Stand Sit to Stand: Independent                  Balance Overall balance assessment: Good in sitting and standing, based on informal observation                Vision   Additional Comments: WFL for participation in the session                Pertinent Vitals/Pain Pain Assessment Pain Assessment: No/denies pain     Hand Dominance Right   Extremity/Trunk Assessment Upper Extremity Assessment Upper Extremity Assessment:  (R UE AROM and strength WFL. L hand AROM WFL; proximal L UE ROM motor and sensory deficits given recent surgical nerve block)           Communication     Cognition Arousal/Alertness: Awake/alert Behavior  During Therapy: WFL for tasks assessed/performed Overall Cognitive Status: Within Functional Limits for tasks assessed              General Comments: She was oriented x4.           Exercises     Shoulder Instructions Shoulder Instructions Donning/doffing shirt without moving shoulder: Independent;Caregiver independent with task Method for sponge bathing under operated UE:  Independent;Caregiver independent with task Donning/doffing sling/immobilizer: Independent;Caregiver independent with task Correct positioning of sling/immobilizer: Independent;Caregiver independent with task Pendulum exercises (written home exercise program): Independent;Caregiver independent with task ROM for elbow, wrist and digits of operated UE: Independent;Caregiver independent with task Sling wearing schedule (on at all times/off for ADL's): Independent;Caregiver independent with task Proper positioning of operated UE when showering: Independent;Caregiver independent with task Dressing change: Independent;Caregiver independent with task Positioning of UE while sleeping: Independent;Caregiver independent with task         OT Problem List: Decreased range of motion;Decreased strength;Impaired UE functional use;Pain      OT Treatment/Interventions:      OT Goals(Current goals can be found in the care plan section) Acute Rehab OT Goals OT Goal Formulation: All assessment and education complete, DC therapy  OT Frequency:         AM-PAC OT "6 Clicks" Daily Activity     Outcome Measure Help from another person eating meals?: None Help from another person taking care of personal grooming?: A Little Help from another person toileting, which includes using toliet, bedpan, or urinal?: A Little Help from another person bathing (including washing, rinsing, drying)?: A Little Help from another person to put on and taking off regular upper body clothing?: A Little Help from another person to put on and taking off regular lower body clothing?: A Little 6 Click Score: 19   End of Session Nurse Communication:  (OT education completed)  Activity Tolerance: Patient tolerated treatment well Patient left: in chair;with family/visitor present  OT Visit Diagnosis: Muscle weakness (generalized) (M62.81)                Time: 7628-3151 OT Time Calculation (min): 26 min Charges:  OT General  Charges $OT Visit: 1 Visit OT Evaluation $OT Eval Low Complexity: 1 Low OT Treatments $Self Care/Home Management : 8-22 mins   Leota Sauers, OTR/L 03/16/2022, 2:31 PM

## 2022-03-16 NOTE — Transfer of Care (Signed)
Immediate Anesthesia Transfer of Care Note  Patient: Carol Becker  Procedure(s) Performed: Procedure(s): TOTAL SHOULDER ARTHROPLASTY (Left)  Patient Location: PACU  Anesthesia Type:General  Level of Consciousness: Alert, Awake, Oriented  Airway & Oxygen Therapy: Patient Spontanous Breathing  Post-op Assessment: Report given to RN  Post vital signs: Reviewed and stable  Last Vitals:  Vitals:   03/16/22 0850 03/16/22 0855  BP: 120/77 123/80  Pulse: 83 87  Resp: 15 13  Temp:    SpO2: 588% 50%    Complications: No apparent anesthesia complications

## 2022-03-16 NOTE — Anesthesia Procedure Notes (Signed)
Anesthesia Regional Block: Interscalene brachial plexus block   Pre-Anesthetic Checklist: , timeout performed,  Correct Patient, Correct Site, Correct Laterality,  Correct Procedure, Correct Position, site marked,  Risks and benefits discussed,  Surgical consent,  Pre-op evaluation,  At surgeon's request and post-op pain management  Laterality: Left  Prep: chloraprep       Needles:  Injection technique: Single-shot  Needle Type: Echogenic Stimulator Needle     Needle Length: 5cm  Needle Gauge: 22     Additional Needles:   Procedures:, nerve stimulator,,, ultrasound used (permanent image in chart),,     Nerve Stimulator or Paresthesia:  Response: hand, 0.45 mA  Additional Responses:   Narrative:  Start time: 03/16/2022 8:50 AM End time: 03/16/2022 8:55 AM Injection made incrementally with aspirations every 5 mL.  Performed by: Personally  Anesthesiologist: Janeece Riggers, MD  Additional Notes: Functioning IV was confirmed and monitors were applied.  A 33mm 22ga Arrow echogenic stimulator needle was used. Sterile prep and drape,hand hygiene and sterile gloves were used. Ultrasound guidance: relevant anatomy identified, needle position confirmed, local anesthetic spread visualized around nerve(s)., vascular puncture avoided.  Image printed for medical record. Negative aspiration and negative test dose prior to incremental administration of local anesthetic. The patient tolerated the procedure well.

## 2022-03-16 NOTE — Anesthesia Preprocedure Evaluation (Signed)
Anesthesia Evaluation  Patient identified by MRN, date of birth, ID band Patient awake    Reviewed: Allergy & Precautions, NPO status , Patient's Chart, lab work & pertinent test results, Unable to perform ROS - Chart review only  History of Anesthesia Complications (+) PONV and history of anesthetic complications  Airway Mallampati: II  TM Distance: >3 FB Neck ROM: Full    Dental  (+) Teeth Intact, Dental Advisory Given   Pulmonary sleep apnea , former smoker,    Pulmonary exam normal        Cardiovascular Normal cardiovascular exam     Neuro/Psych  Headaches, PSYCHIATRIC DISORDERS Anxiety Bipolar Disorder  Neuromuscular disease    GI/Hepatic GERD  Medicated,  Endo/Other  diabetes, Type 2Hypothyroidism Hyperthyroidism Obesity  Renal/GU      Musculoskeletal  (+) Arthritis , osteopenia   Abdominal (+) + obese,   Peds  Hematology   Anesthesia Other Findings   Reproductive/Obstetrics                             Anesthesia Physical  Anesthesia Plan  ASA: 3  Anesthesia Plan: General   Post-op Pain Management: Regional block* and Tylenol PO (pre-op)*   Induction: Intravenous  PONV Risk Score and Plan: 4 or greater and Treatment may vary due to age or medical condition, Ondansetron, Dexamethasone, Midazolam and Propofol infusion  Airway Management Planned: Oral ETT  Additional Equipment: None  Intra-op Plan:   Post-operative Plan: Extubation in OR  Informed Consent: I have reviewed the patients History and Physical, chart, labs and discussed the procedure including the risks, benefits and alternatives for the proposed anesthesia with the patient or authorized representative who has indicated his/her understanding and acceptance.     Dental advisory given  Plan Discussed with: Anesthesiologist and CRNA  Anesthesia Plan Comments:         Anesthesia Quick Evaluation

## 2022-03-17 ENCOUNTER — Encounter (HOSPITAL_COMMUNITY): Payer: Self-pay | Admitting: Orthopedic Surgery

## 2022-03-28 ENCOUNTER — Telehealth: Payer: Self-pay | Admitting: Physical Therapy

## 2022-03-28 NOTE — Telephone Encounter (Signed)
Attempted to call pt to see if she wanted to come in earlier for evaluation. She did not pick up and VM was full.

## 2022-03-29 ENCOUNTER — Ambulatory Visit: Payer: Managed Care, Other (non HMO) | Attending: Orthopedic Surgery | Admitting: Physical Therapy

## 2022-03-29 ENCOUNTER — Encounter: Payer: Self-pay | Admitting: Physical Therapy

## 2022-03-29 ENCOUNTER — Other Ambulatory Visit: Payer: Self-pay

## 2022-03-29 DIAGNOSIS — M6281 Muscle weakness (generalized): Secondary | ICD-10-CM | POA: Diagnosis present

## 2022-03-29 DIAGNOSIS — R262 Difficulty in walking, not elsewhere classified: Secondary | ICD-10-CM | POA: Insufficient documentation

## 2022-03-29 DIAGNOSIS — M25512 Pain in left shoulder: Secondary | ICD-10-CM | POA: Insufficient documentation

## 2022-03-29 NOTE — Therapy (Signed)
OUTPATIENT PHYSICAL THERAPY SHOULDER EVALUATION   Patient Name: Carol Becker MRN: [id number] DOB:09-Aug-1960, 61 y.o., female Today's Date: 03/29/2022   PT End of Session - 03/29/22 1305     Visit Number 1    Number of Visits 20    Date for PT Re-Evaluation 06/07/22    Authorization Type [employer] [id number]    Progress Note Due on Visit 10    PT Start Time 1145    PT Stop Time 1230    PT Time Calculation (min) 45 min    Behavior During Therapy WFL for tasks assessed/performed             Past Medical History:  Diagnosis Date   Adrenal nodule (HCC)    Anxiety    Arthritis    knees, left thumb, shoulders   Breast cancer ([place])    Left   Cervical spinal stenosis    Family history of breast cancer    GERD (gastroesophageal reflux disease)    History of hypothyroidism    Hyperlipidemia    poss history of   Hyperthyroidism    Osteopenia    PONV (postoperative nausea and vomiting)    after wisdom teeth   Pre-diabetes    Restless leg syndrome    Sleep apnea    Tremors of nervous system    Vertigo    Vestibular migraine    Past Surgical History:  Procedure Laterality Date   ANTERIOR / POSTERIOR COMBINED FUSION CERVICAL SPINE     BREAST BIOPSY Left 03/15/2021   Stereo bx, Ribbon Clip, path pending   BREAST LUMPECTOMY Left    COLONOSCOPY WITH PROPOFOL N/A 11/05/2020   Procedure: COLONOSCOPY WITH PROPOFOL;  Surgeon: Lucilla Lame, MD;  Location: [place];  Service: Endoscopy;  Laterality: N/A;   CYSTECTOMY Left    KNEE ARTHROSCOPY WITH EXCISION BAKER'S CYST Left    LAPAROSCOPY     Exploratory   POLYPECTOMY  11/05/2020   Procedure: POLYPECTOMY;  Surgeon: Lucilla Lame, MD;  Location: [place];  Service: Endoscopy;;   SHOULDER ARTHROSCOPY WITH ROTATOR CUFF REPAIR Left 12/01/2021   Procedure: Left shoulder arthroscopy, debridement, subacromial decompression, distal clavicle resection, chondroplasty;  Surgeon: Justice Britain, MD;  Location: [facility];   Service: Orthopedics;  Laterality: Left;  57min   TOTAL MASTECTOMY Bilateral    TOTAL SHOULDER ARTHROPLASTY Left 03/16/2022   Procedure: TOTAL SHOULDER ARTHROPLASTY;  Surgeon: Justice Britain, MD;  Location: [facility];  Service: Orthopedics;  Laterality: Left;   trapeziectomy Left    done at [facility] 06/16/20   TUBAL LIGATION     WISDOM TOOTH EXTRACTION     Patient Active Problem List   Diagnosis Date Noted   RLS (restless legs syndrome) 11/21/2021   Family history of breast cancer 04/12/2021   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/2021   Adhesive capsulitis of left shoulder 03/04/2021   Vestibular migraine 11/12/2020   Posterior vitreous detachment of both eyes 11/12/2020   Polyp of ascending colon    Pain in finger of left hand 08/30/2020   Vertiginous migraine 08/14/2020   Status post cervical spinal fusion 08/03/2020   Phonophobia 04/05/2020   Benign paroxysmal positional vertigo 04/05/2020   GAD (generalized anxiety disorder) 03/23/2020   Drug-induced parkinsonism ([place]) 03/23/2020   Type 2 diabetes mellitus with neurological complications ([place]) 52/77/8242   Prolapsed cervical intervertebral disc 01/21/2019   Rheumatoid factor positive 12/23/2018   Primary osteoarthritis of first carpometacarpal joint of left hand 09/01/2018  Pain in joints of left hand 09/01/2018   Chronic pain 01/07/2018   DJD (degenerative joint disease), cervical 12/19/2016   Controlled substance agreement signed 06/13/2016   Morbid obesity ([place]) 04/20/2015   Hyperlipidemia 03/23/2015   Hyperthyroidism 03/23/2015   Ulnar neuropathy 12/24/2014   Carpal tunnel syndrome 09/15/2014    PCP: Jenetta Loges PA-C   REFERRING PROVIDER: Dr. Park Liter   REFERRING DIAG: Left shoulder anatomic arthroplasty  THERAPY DIAG:  Acute pain of left shoulder  Rationale for Evaluation and Treatment Rehabilitation  ONSET DATE: 03/16/22  SUBJECTIVE:                                                                                                                                                                                       SUBJECTIVE STATEMENT: See pertinent history   PERTINENT HISTORY: Pt reports having left shoulder arthroplasty 2/2 to severe left shoulder OA on 03/16/22   PAIN:  Are you having pain? Yes: NPRS scale: 7/10 Pain location: Inside the left shoulder  Pain description: Sharp  Aggravating factors: Extending Shoulder and Reaching Down  Relieving factors: Not moving shoulder in contraindicated positions   PRECAUTIONS: Shoulder  Within 1-2 weeks. Flexion 0-90, External Rotation 30, Internal Rotation to abdomen, Abduction 0-60   WEIGHT BEARING RESTRICTIONS No  FALLS:  Has patient fallen in last 6 months? No  LIVING ENVIRONMENT: Lives with: lives with their spouse Lives in: House/apartment Stairs: Yes: Internal: 13 steps; on right going up and External: 6 steps; on left going up Has following equipment at home: None  OCCUPATION: Desk job   PLOF: Independent  PATIENT GOALS Improve ROM of shoulder   OBJECTIVE:               VITALS: 117/73 HR 83 SpO2 99  DIAGNOSTIC FINDINGS:  Imaging not listed   PATIENT SURVEYS:  FOTO 56/64  COGNITION:  Overall cognitive status: Within functional limits for tasks assessed     SENSATION: WFL  POSTURE: Forward head and rounded shoulders   UPPER EXTREMITY ROM:   Passive ROM Right eval Left eval  Shoulder flexion 180 90  Shoulder extension 60   Shoulder abduction 180 90  Shoulder adduction    Shoulder internal rotation    Shoulder external rotation  30  Elbow flexion 150 150  Elbow extension 60 60  Wrist flexion 80 80  Wrist extension 70 70  Wrist ulnar deviation 30 30  Wrist radial deviation 20 20  Wrist pronation 80 80  Wrist supination 80 80  (Blank rows = not tested)          UPPER EXTREMITY MMT:  MMT Right eval Left eval  Shoulder flexion 5 NT  Shoulder extension  NT   Shoulder abduction 5 NT  Shoulder adduction    Shoulder internal rotation NT NT  Shoulder external rotation NT NT   Middle trapezius    Lower trapezius    Elbow flexion 5 NT  Elbow extension 5 NT  Wrist flexion 5 5  Wrist extension 5 5  Wrist ulnar deviation 5 5  Wrist radial deviation 5 5  Wrist pronation 5 5  Wrist supination 5 5  Grip strength (lbs) Good Good  (Blank rows = not tested)  JOINT MOBILITY TESTING:  Not assessed   PALPATION:  Not assessed    TODAY'S TREATMENT:  Shoulder AAROM Flexion/Extension to 90 degrees on LUE 1 x 10  Shoulder AAROM Abduction/Adduction to 60 degrees on LUE 1 x 10  Shoulder ER with LUE 1 x 10 with 3 sec hold  Shoulder Abduction with LUE 1 X 10 with 3 sec hold  Elbow Flexion with LUE 1 x 10 with 3 sec hold   PATIENT EDUCATION: Education details: form and technique for appropriate exercise  Person educated: Patient Education method: Explanation, Verbal cues, and Handouts Education comprehension: verbalized understanding and returned demonstration   HOME EXERCISE PROGRAM: Access Code: [email or web address] URL: [email or web address] Date: 03/29/2022 Prepared by: Bradly Chris  Exercises - Seated Shoulder Flexion AAROM with Pulley Behind  - 1 x daily - 7 x weekly - 3 sets - 10 reps - Seated Shoulder Scaption AAROM with Pulley at Side  - 1 x daily - 7 x weekly - 3 sets - 10 reps - Isometric Shoulder External Rotation at Wall  - 1 x daily - 3 x weekly - 3 sets - 10 reps - 3 sec hold - Isometric Shoulder Abduction at Wall  - 1 x daily - 3 x weekly - 3 sets - 10 reps - 3 sec  hold - Seated Isometric Elbow Flexion  - 1 x daily - 3 x weekly - 3 sets - 10 reps - 3 sec hold  ASSESSMENT:  CLINICAL IMPRESSION: Patient is a 61 y.o. female who was seen today for physical therapy evaluation and treatment for left shoulder anatomic arthroplasty. She    OBJECTIVE IMPAIRMENTS decreased ROM, decreased strength, impaired UE functional use, and  pain.   ACTIVITY LIMITATIONS carrying, lifting, bathing, toileting, dressing, reach over head, and hygiene/grooming  PARTICIPATION LIMITATIONS: cleaning, laundry, driving, occupation, and yard work  PERSONAL FACTORS Fitness, Time since onset of injury/illness/exacerbation, and 1 comorbidity: anxiety  are also affecting patient's functional outcome.   REHAB POTENTIAL: Good  CLINICAL DECISION MAKING: Stable/uncomplicated  EVALUATION COMPLEXITY: Low   GOALS: Goals reviewed with patient? No  SHORT TERM GOALS: Target date: 04/12/2022    Pt will be independent with HEP in order to improve strength and balance in order to decrease fall risk and improve function at home and work. Baseline: NT  Goal status: INITIAL    LONG TERM GOALS: Target date: 06/07/2022  (Remove Blue Hyperlink)  Patient will have improved function and activity level as evidenced by an increase in FOTO score by 10 points or more.  Baseline: 56 Goal status: INITIAL                        2.  Patient will improve left shoulder AROM symmetrical to right shoulder AROM for improved UE function to return to completing ADLs with LUE. Baseline: Left shoulder flex 90, Shoulder ER 30, Shoulder Abduction 60 Goal status: INITIAL  3.  Patient will improve left shoulder strength to be the same as right shoulder for improved UE function to return to completing ADLs with LUE.  Baseline: NT because of ROM restrictions. Goal status: INITIAL  PLAN: PT FREQUENCY: 1-2x/week  PT DURATION: 10 weeks  PLANNED INTERVENTIONS: Therapeutic exercises, Patient/Family education, Self Care, Joint mobilization, Joint manipulation, Aquatic Therapy, Dry Needling, Electrical stimulation, Cryotherapy, Moist heat, Manual therapy, and Re-evaluation  PLAN FOR NEXT SESSION: Progress shoulder AAROM exercises: flexion to 90-110 degrees, ER in scapular plane 30 degrees, IR in scapular plane at 45 degrees to side. Initiate AAROM ER/IR supine with L-Bar    Bradly Chris PT, DPT  03/29/2022, 1:11 PM

## 2022-03-30 NOTE — Telephone Encounter (Signed)
FYI

## 2022-04-02 NOTE — Therapy (Signed)
OUTPATIENT PHYSICAL THERAPY TREATMENT NOTE   Patient Name: Carol Becker MRN: [id number] DOB:[birth year], 61 y.o., female Today's Date: 04/03/2022   PT End of Session - 04/03/22 1106     Visit Number 2    Number of Visits 20    Date for PT Re-Evaluation 06/07/22    Authorization Type [employer] 2023    Progress Note Due on Visit 10    PT Start Time 1105    PT Stop Time 1145    PT Time Calculation (min) 40 min    Behavior During Therapy WFL for tasks assessed/performed              Past Medical History:  Diagnosis Date   Adrenal nodule (HCC)    Anxiety    Arthritis    knees, left thumb, shoulders   Breast cancer ([place])    Left   Cervical spinal stenosis    Family history of breast cancer    GERD (gastroesophageal reflux disease)    History of hypothyroidism    Hyperlipidemia    poss history of   Hyperthyroidism    Osteopenia    PONV (postoperative nausea and vomiting)    after wisdom teeth   Pre-diabetes    Restless leg syndrome    Sleep apnea    Tremors of nervous system    Vertigo    Vestibular migraine    Past Surgical History:  Procedure Laterality Date   ANTERIOR / POSTERIOR COMBINED FUSION CERVICAL SPINE     BREAST BIOPSY Left 03/15/2021   Stereo bx, Ribbon Clip, path pending   BREAST LUMPECTOMY Left    COLONOSCOPY WITH PROPOFOL N/A 11/05/2020   Procedure: COLONOSCOPY WITH PROPOFOL;  Surgeon: Lucilla Lame, MD;  Location: [place];  Service: Endoscopy;  Laterality: N/A;   CYSTECTOMY Left    KNEE ARTHROSCOPY WITH EXCISION BAKER'S CYST Left    LAPAROSCOPY     Exploratory   POLYPECTOMY  11/05/2020   Procedure: POLYPECTOMY;  Surgeon: Lucilla Lame, MD;  Location: [place];  Service: Endoscopy;;   SHOULDER ARTHROSCOPY WITH ROTATOR CUFF REPAIR Left 12/01/2021   Procedure: Left shoulder arthroscopy, debridement, subacromial decompression, distal clavicle resection, chondroplasty;  Surgeon: Justice Britain, MD;  Location[facility];  Service:  Orthopedics;  Laterality: Left;  82min   TOTAL MASTECTOMY Bilateral    TOTAL SHOULDER ARTHROPLASTY Left 03/16/2022   Procedure: TOTAL SHOULDER ARTHROPLASTY;  Surgeon: Justice Britain, MD;  Location[facility];  Service: Orthopedics;  Laterality: Left;   trapeziectomy Left    done at [facility] 06/16/20   TUBAL LIGATION     WISDOM TOOTH EXTRACTION     Patient Active Problem List   Diagnosis Date Noted   RLS (restless legs syndrome) 11/21/2021   Family history of breast cancer 04/12/2021   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) 97/67/3419   Prolapsed cervical intervertebral disc 01/21/2019   Rheumatoid factor positive 12/23/2018   Primary osteoarthritis of first carpometacarpal joint of left hand 09/01/2018  Pain in joints of left hand 09/01/2018   Chronic pain 01/07/2018   DJD (degenerative joint disease), cervical 12/19/2016   Controlled substance agreement signed 06/13/2016   Morbid obesity ([place]) 04/20/2015   Hyperlipidemia 03/23/2015   Hyperthyroidism 03/23/2015   Ulnar neuropathy 12/24/2014   Carpal tunnel syndrome 09/15/2014    PCP: Jenetta Loges PA-C   REFERRING PROVIDER: Dr. Park Liter   REFERRING DIAG: Left shoulder anatomic arthroplasty  THERAPY DIAG:  Difficulty in walking, not elsewhere classified  Muscle weakness (generalized)  Acute pain of left shoulder  Rationale for Evaluation and Treatment  Rehabilitation  ONSET DATE: 03/16/22  SUBJECTIVE:                                                                                                                                                                                      SUBJECTIVE STATEMENT:  Pt reports she is doing well with all the exercises and they are not causing any increase in pain.  Pt notes that she has been doing them consistently and her ROM has improved so far.  Pt only noting 2/10 pain upon arrival.  Pt jokingly states the lack of pain concerns her a times.  PERTINENT HISTORY: Pt reports having left shoulder arthroplasty 2/2 to severe left shoulder OA on 03/16/22   PAIN:   Are you having pain? Yes: NPRS scale: 2/10 Pain location: Inside the left shoulder  Pain description: Sharp  Aggravating factors: Extending Shoulder and Reaching Down  Relieving factors: Not moving shoulder in contraindicated positions   PRECAUTIONS: Shoulder  Within 1-2 weeks. Flexion 0-90, External Rotation 30, Internal Rotation to abdomen, Abduction 0-60   WEIGHT BEARING RESTRICTIONS No  FALLS:  Has patient fallen in last 6 months? No  LIVING ENVIRONMENT: Lives with: lives with their spouse Lives in: House/apartment Stairs: Yes: Internal: 13 steps; on right going up and External: 6 steps; on left going up Has following equipment at home: None  OCCUPATION: Desk job   PLOF: Independent  PATIENT GOALS Improve ROM of shoulder    OBJECTIVE:   VITALS: 117/73 HR 83 SpO2 99  DIAGNOSTIC FINDINGS:  Imaging not listed   PATIENT SURVEYS:  FOTO 56/64  COGNITION: Overall cognitive status: Within functional limits for tasks assessed     SENSATION: WFL  POSTURE: Forward head and rounded shoulders   UPPER EXTREMITY ROM:   Passive ROM Right eval Left eval  Shoulder flexion 180 90  Shoulder extension 60   Shoulder abduction 180 90  Shoulder adduction    Shoulder internal rotation    Shoulder external rotation  30  Elbow  flexion 150 150  Elbow extension 60 60  Wrist flexion  80 80  Wrist extension 70 70  Wrist ulnar deviation 30 30  Wrist radial deviation 20 20  Wrist pronation 80 80  Wrist supination 80 80  (Blank rows = not tested)          UPPER EXTREMITY MMT:  MMT Right eval Left eval  Shoulder flexion 5 NT  Shoulder extension  NT  Shoulder abduction 5 NT  Shoulder adduction    Shoulder internal rotation NT NT  Shoulder external rotation NT NT   Middle trapezius    Lower trapezius    Elbow flexion 5 NT  Elbow extension 5 NT  Wrist flexion 5 5  Wrist extension 5 5  Wrist ulnar deviation 5 5  Wrist radial deviation 5 5  Wrist pronation 5 5  Wrist supination 5 5  Grip strength (lbs) Good Good  (Blank rows = not tested)  JOINT MOBILITY TESTING:  Not assessed   PALPATION:  Not assessed    TODAY'S TREATMENT:   TherEx:  Supine AAROM chest press with PVC pipe, 2x15 Supine AAROM flexion/extension to 110 deg on LUE, 2x15 Supine AAROM ER to 30 deg on L UE, 2x15   Manual:  Supine PROM of shoulder in all planes of mobility within protocol limits.  Pt does demonstrate some muscle guarding with PROM at times, but with verbal cuing is able to correct. Supine STM to cervical region to increase extensibility of the paraspinals Supine UT/Levator stretch, 30 sec bouts to increase tissue extensibility of the cervical region following muscle tightness from sling     PATIENT EDUCATION: Education details: form and technique for appropriate exercise  Person educated: Patient Education method: Explanation, Verbal cues, and Handouts Education comprehension: verbalized understanding and returned demonstration   HOME EXERCISE PROGRAM: Access Code: [email or web address] URL: [email or web address] Date: 03/29/2022 Prepared by: Bradly Chris  Exercises - Seated Shoulder Flexion AAROM with Pulley Behind  - 1 x daily - 7 x weekly - 3 sets - 10 reps - Seated Shoulder Scaption AAROM  with Pulley at Side  - 1 x daily - 7 x weekly - 3 sets - 10 reps - Isometric Shoulder External Rotation at Wall  - 1 x daily - 3 x weekly - 3 sets - 10 reps - 3 sec hold - Isometric Shoulder Abduction at Wall  - 1 x daily - 3 x weekly - 3 sets - 10 reps - 3 sec  hold - Seated Isometric Elbow Flexion  - 1 x daily - 3 x weekly - 3 sets - 10 reps - 3 sec hold  ASSESSMENT:  CLINICAL IMPRESSION:  Pt responded well to exercises given and puts forth good effort throughout.  Pt noted to benefit from UT stretches and manual therapy approach in order to improve overall ROM of the shoulder and tightness of the cervical region as well.   Pt will continue to benefit from skilled therapy to address remaining deficits in order to improve overall QoL and return to PLOF.      OBJECTIVE IMPAIRMENTS decreased ROM, decreased strength, impaired UE functional use, and pain.   ACTIVITY LIMITATIONS carrying, lifting, bathing, toileting, dressing, reach over head, and hygiene/grooming  PARTICIPATION LIMITATIONS: cleaning, laundry, driving, occupation, and yard work  PERSONAL FACTORS Fitness, Time since onset of injury/illness/exacerbation, and 1 comorbidity: anxiety  are also affecting patient's functional outcome.   REHAB POTENTIAL: Good  CLINICAL DECISION MAKING: Stable/uncomplicated  EVALUATION COMPLEXITY: Low   GOALS: Goals reviewed with patient? No  SHORT TERM GOALS: Target date: 04/17/2022  Pt will be independent with HEP in order to improve strength and balance in order to decrease fall risk and improve function at home and work. Baseline: NT  Goal status: INITIAL    LONG TERM GOALS: Target date: 06/12/2022  (Remove Blue Hyperlink)  Patient will have improved function and activity level as evidenced by an increase in FOTO score by 10 points or more.  Baseline: 56 Goal status: INITIAL                        2.  Patient will improve left shoulder AROM symmetrical to right shoulder AROM for  improved UE function to return to completing ADLs with LUE. Baseline: Left shoulder flex 90, Shoulder ER 30, Shoulder Abduction 60 Goal status: INITIAL  3.  Patient will improve left shoulder strength to be the same as right shoulder for improved UE function to return to completing ADLs with LUE.  Baseline: NT because of ROM restrictions. Goal status: INITIAL  PLAN: PT FREQUENCY: 1-2x/week  PT DURATION: 10 weeks  PLANNED INTERVENTIONS: Therapeutic exercises, Patient/Family education, Self Care, Joint mobilization, Joint manipulation, Aquatic Therapy, Dry Needling, Electrical stimulation, Cryotherapy, Moist heat, Manual therapy, and Re-evaluation  PLAN FOR NEXT SESSION: Progress shoulder AAROM exercises: flexion to 90-110 degrees, ER in scapular plane 30 degrees, IR in scapular plane at 45 degrees to side. Initiate AAROM ER/IR supine with L-Bar    Gwenlyn Saran, PT, DPT 04/03/22, 1:26 PM

## 2022-04-03 ENCOUNTER — Ambulatory Visit: Payer: Managed Care, Other (non HMO)

## 2022-04-03 DIAGNOSIS — M6281 Muscle weakness (generalized): Secondary | ICD-10-CM

## 2022-04-03 DIAGNOSIS — R262 Difficulty in walking, not elsewhere classified: Secondary | ICD-10-CM

## 2022-04-03 DIAGNOSIS — M25512 Pain in left shoulder: Secondary | ICD-10-CM | POA: Diagnosis not present

## 2022-04-05 ENCOUNTER — Encounter: Payer: Self-pay | Admitting: Family Medicine

## 2022-04-06 ENCOUNTER — Ambulatory Visit: Payer: Managed Care, Other (non HMO) | Admitting: Physical Therapy

## 2022-04-06 DIAGNOSIS — M6281 Muscle weakness (generalized): Secondary | ICD-10-CM

## 2022-04-06 DIAGNOSIS — M25512 Pain in left shoulder: Secondary | ICD-10-CM

## 2022-04-06 MED ORDER — FAMOTIDINE 20 MG PO TABS
20.0000 mg | ORAL_TABLET | Freq: Two times a day (BID) | ORAL | 1 refills | Status: DC
Start: 2022-04-06 — End: 2022-05-29

## 2022-04-06 NOTE — Therapy (Signed)
OUTPATIENT PHYSICAL THERAPY TREATMENT NOTE   Patient Name: Carol Becker MRN: [id number] DOB:Jan 18, 1961, 61 y.o., female Today's Date: 04/06/2022   PT End of Session - 04/06/22 1235     Visit Number 3    Number of Visits 20    Date for PT Re-Evaluation 06/07/22    Authorization Type [employer] [id number]    Progress Note Due on Visit 10    PT Start Time 1020    PT Stop Time 1105    PT Time Calculation (min) 45 min    Activity Tolerance Patient tolerated treatment well    Behavior During Therapy WFL for tasks assessed/performed               Past Medical History:  Diagnosis Date   Adrenal nodule (HCC)    Anxiety    Arthritis    knees, left thumb, shoulders   Breast cancer ([place])    Left   Cervical spinal stenosis    Family history of breast cancer    GERD (gastroesophageal reflux disease)    History of hypothyroidism    Hyperlipidemia    poss history of   Hyperthyroidism    Osteopenia    PONV (postoperative nausea and vomiting)    after wisdom teeth   Pre-diabetes    Restless leg syndrome    Sleep apnea    Tremors of nervous system    Vertigo    Vestibular migraine    Past Surgical History:  Procedure Laterality Date   ANTERIOR / POSTERIOR COMBINED FUSION CERVICAL SPINE     BREAST BIOPSY Left 03/15/2021   Stereo bx, Ribbon Clip, path pending   BREAST LUMPECTOMY Left    COLONOSCOPY WITH PROPOFOL N/A 11/05/2020   Procedure: COLONOSCOPY WITH PROPOFOL;  Surgeon: Lucilla Lame, MD;  Location: [place];  Service: Endoscopy;  Laterality: N/A;   CYSTECTOMY Left    KNEE ARTHROSCOPY WITH EXCISION BAKER'S CYST Left    LAPAROSCOPY     Exploratory   POLYPECTOMY  11/05/2020   Procedure: POLYPECTOMY;  Surgeon: Lucilla Lame, MD;  Location: [place];  Service: Endoscopy;;   SHOULDER ARTHROSCOPY WITH ROTATOR CUFF REPAIR Left 12/01/2021   Procedure: Left shoulder arthroscopy, debridement, subacromial decompression, distal clavicle resection, chondroplasty;   Surgeon: Justice Britain, MD;  Location: [facility];  Service: Orthopedics;  Laterality: Left;  71min   TOTAL MASTECTOMY Bilateral    TOTAL SHOULDER ARTHROPLASTY Left 03/16/2022   Procedure: TOTAL SHOULDER ARTHROPLASTY;  Surgeon: Justice Britain, MD;  Location: [facility];  Service: Orthopedics;  Laterality: Left;   trapeziectomy Left    done at [facility] 06/16/20   TUBAL LIGATION     WISDOM TOOTH EXTRACTION     Patient Active Problem List   Diagnosis Date Noted   RLS (restless legs syndrome) 11/21/2021   Family history of breast cancer 04/12/2021   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/2021   Adhesive capsulitis of left shoulder 03/04/2021   Vestibular migraine 11/12/2020   Posterior vitreous detachment of both eyes 11/12/2020   Polyp of ascending colon    Pain in finger of left hand 08/30/2020   Vertiginous migraine 08/14/2020   Status post cervical spinal fusion 08/03/2020   Phonophobia 04/05/2020   Benign paroxysmal positional vertigo 04/05/2020   GAD (generalized anxiety disorder) 03/23/2020   Drug-induced parkinsonism ([place]) 03/23/2020   Type 2 diabetes mellitus with neurological complications ([place]) 53/66/4403   Prolapsed cervical intervertebral disc 01/21/2019   Rheumatoid factor positive 12/23/2018  Primary osteoarthritis of first carpometacarpal joint of left hand 09/01/2018   Pain in joints of left hand 09/01/2018   Chronic pain 01/07/2018   DJD (degenerative joint disease), cervical 12/19/2016   Controlled substance agreement signed 06/13/2016   Morbid obesity ([place]) 04/20/2015   Hyperlipidemia 03/23/2015   Hyperthyroidism 03/23/2015   Ulnar neuropathy 12/24/2014   Carpal tunnel syndrome 09/15/2014    PCP: Jenetta Loges PA-C   REFERRING PROVIDER: Dr. Park Liter   REFERRING DIAG: Left shoulder anatomic arthroplasty  THERAPY DIAG:  Acute pain of left shoulder  Muscle weakness (generalized)  Rationale for Evaluation and Treatment  Rehabilitation  ONSET DATE: 03/16/22  SUBJECTIVE:                                                                                                                                                                                      SUBJECTIVE STATEMENT:  Pt reports increased pain after discontinuing NSAIDs. She expresses concern about pain being intolerable when she runs out of medication.   PERTINENT HISTORY: Pt reports having left shoulder arthroplasty 2/2 to severe left shoulder OA on 03/16/22   PAIN:   Are you having pain? Yes: NPRS scale: 2/10 Pain location: Inside the left shoulder  Pain description: Sharp  Aggravating factors: Extending Shoulder and Reaching Down  Relieving factors: Not moving shoulder in contraindicated positions   PRECAUTIONS: Shoulder  Within 1-2 weeks. Flexion 0-90, External Rotation 30, Internal Rotation to abdomen, Abduction 0-60   WEIGHT BEARING RESTRICTIONS No  FALLS:  Has patient fallen in last 6 months? No  LIVING ENVIRONMENT: Lives with: lives with their spouse Lives in: House/apartment Stairs: Yes: Internal: 13 steps; on right going up and External: 6 steps; on left going up Has following equipment at home: None  OCCUPATION: Desk job   PLOF: Independent  PATIENT GOALS Improve ROM of shoulder    OBJECTIVE:   VITALS: 117/73 HR 83 SpO2 99  DIAGNOSTIC FINDINGS:  Imaging not listed   PATIENT SURVEYS:  FOTO 56/64  COGNITION: Overall cognitive status: Within functional limits for tasks assessed     SENSATION: WFL  POSTURE: Forward head and rounded shoulders   UPPER EXTREMITY ROM:   Passive ROM Right eval Left eval  Shoulder flexion 180 90  Shoulder extension 60   Shoulder abduction 180 90  Shoulder adduction    Shoulder internal rotation    Shoulder external rotation  30  Elbow flexion 150 150  Elbow extension 60 60  Wrist flexion 80 80  Wrist extension 70 70  Wrist ulnar deviation 30 30  Wrist radial deviation 20 20   Wrist pronation 80 80  Wrist supination 80 80  (  Blank rows = not tested)          UPPER EXTREMITY MMT:  MMT Right eval Left eval  Shoulder flexion 5 NT  Shoulder extension  NT  Shoulder abduction 5 NT  Shoulder adduction    Shoulder internal rotation NT NT  Shoulder external rotation NT NT   Middle trapezius    Lower trapezius    Elbow flexion 5 NT  Elbow extension 5 NT  Wrist flexion 5 5  Wrist extension 5 5  Wrist ulnar deviation 5 5  Wrist radial deviation 5 5  Wrist pronation 5 5  Wrist supination 5 5  Grip strength (lbs) Good Good  (Blank rows = not tested)  JOINT MOBILITY TESTING:  Not assessed   PALPATION:  Not assessed    TODAY'S TREATMENT:   04/06/22:  THEREX:  Shoulder AAROM Pulleys  Flexion/Extenson 3 x 10 only to 90 degrees  Shoulder AAROM Pulleys Abduction/Adduction 3 x 10 only to 90 degrees  Seated Rows with Gray Band 3 x 10  Shoulder ER/IR at 0 degrees abduction with #2 DB 2 x 10 Shoulder ER/IR at 0 degrees abduction with red band 1 x 5 -Pt needed to terminate due to increased pain   Shoulder ER/IR at 0 degrees abduction with yellow band 1 x 10   Left Upper Trap Stretch 3 x 30 sec   MANUAL THERAPY    MFR of left upper trap and rhomboids using using massage chair   04/03/22:  TherEx:  Supine AAROM chest press with PVC pipe, 2x15 Supine AAROM flexion/extension to 110 deg on LUE, 2x15 Supine AAROM ER to 30 deg on L UE, 2x15   Manual:  Supine PROM of shoulder in all planes of mobility within protocol limits.  Pt does demonstrate some muscle guarding with PROM at times, but with verbal cuing is able to correct. Supine STM to cervical region to increase extensibility of the paraspinals Supine UT/Levator stretch, 30 sec bouts to increase tissue extensibility of the cervical region following muscle tightness from sling     PATIENT EDUCATION: Education details: form and technique for appropriate exercise  Person educated:  Patient Education method: Explanation, Verbal cues, and Handouts Education comprehension: verbalized understanding and returned demonstration   HOME EXERCISE PROGRAM: Access Code: [email or web address] URL: [email or web address] Date: 04/06/2022 Prepared by: Bradly Chris  Exercises - Seated Shoulder Flexion AAROM with Pulley Behind  - 1 x daily - 7 x weekly - 3 sets - 10 reps - Seated Shoulder Scaption AAROM with Pulley at Side  - 1 x daily - 7 x weekly - 3 sets - 10 reps - Isometric Shoulder External Rotation at Wall  - 1 x daily - 3 x weekly - 3 sets - 10 reps - 3 sec hold - Isometric Shoulder Abduction at Wall  - 1 x daily - 3 x weekly - 3 sets - 10 reps - 3 sec  hold - Seated Isometric Elbow Flexion  - 1 x daily - 3 x weekly - 3 sets - 10 reps - 3 sec hold - Seated Shoulder Row with Anchored Resistance  - 1 x daily - 3 x weekly - 3 sets - 10 reps - Seated Gentle Upper Trapezius Stretch  - 1 x daily - 7 x weekly - 3 reps - 30-60 sec hold - Standing Shoulder External Rotation with Resistance  - 1 x daily - 3 x weekly - 3 sets - 10 reps  ASSESSMENT:  CLINICAL IMPRESSION:  Pt presents s/p 3  weeks post op left shoulder arthroplasty exhibits improvement in parascapular strength with ability to perform strengthening exercises with increased resistance and without an increase in her pain. Pt's muscular hypertension resolved after performing soft tissue massage along with associated pain. Pt will continue to benefit from skilled therapy to address remaining deficits in order to improve overall QoL and return to PLOF.   OBJECTIVE IMPAIRMENTS decreased ROM, decreased strength, impaired UE functional use, and pain.   ACTIVITY LIMITATIONS carrying, lifting, bathing, toileting, dressing, reach over head, and hygiene/grooming  PARTICIPATION LIMITATIONS: cleaning, laundry, driving, occupation, and yard work  PERSONAL FACTORS Fitness, Time since onset of injury/illness/exacerbation, and 1  comorbidity: anxiety  are also affecting patient's functional outcome.   REHAB POTENTIAL: Good  CLINICAL DECISION MAKING: Stable/uncomplicated  EVALUATION COMPLEXITY: Low   GOALS: Goals reviewed with patient? No  SHORT TERM GOALS: Target date: 04/20/2022    Pt will be independent with HEP in order to improve strength and balance in order to decrease fall risk and improve function at home and work. Baseline: Performing independently  Goal status: Ongoing     LONG TERM GOALS: Target date: 06/15/2022  (Remove Blue Hyperlink)  Patient will have improved function and activity level as evidenced by an increase in FOTO score by 10 points or more.  Baseline: 56 Goal status: Ongoing                         2.  Patient will improve left shoulder AROM symmetrical to right shoulder AROM for improved UE function to return to completing ADLs with LUE. Baseline: Left shoulder flex 90, Shoulder ER 30, Shoulder Abduction 60 Goal status: Ongoing   3.  Patient will improve left shoulder strength to be the same as right shoulder for improved UE function to return to completing ADLs with LUE.  Baseline: NT because of ROM restrictions. Goal status: Ongoing   PLAN: PT FREQUENCY: 1-2x/week  PT DURATION: 10 weeks  PLANNED INTERVENTIONS: Therapeutic exercises, Patient/Family education, Self Care, Joint mobilization, Joint manipulation, Aquatic Therapy, Dry Needling, Electrical stimulation, Cryotherapy, Moist heat, Manual therapy, and Re-evaluation  PLAN FOR NEXT SESSION: Progress shoulder AAROM exercises: flexion to 90-110 degrees, ER in scapular plane 30 degrees, IR in scapular plane at 45 degrees to side. Initiate AAROM ER/IR supine with L-Bar    Bradly Chris PT, DPT  04/06/22, 12:36 PM

## 2022-04-10 ENCOUNTER — Ambulatory Visit: Payer: Managed Care, Other (non HMO) | Attending: Orthopedic Surgery | Admitting: Physical Therapy

## 2022-04-10 DIAGNOSIS — M25512 Pain in left shoulder: Secondary | ICD-10-CM | POA: Diagnosis not present

## 2022-04-10 NOTE — Therapy (Addendum)
OUTPATIENT PHYSICAL THERAPY TREATMENT NOTE/ Discharge Summary   Discharge Summary: Pt has since requested to discharge from PT. She did not give a reason why.   Patient Name: Carol Becker MRN: [id number] DOB:[birth year], 61 y.o., female Today's Date: 04/10/2022   PT End of Session - 04/10/22 1726     Visit Number 4    Number of Visits 20    Date for PT Re-Evaluation 06/07/22    Authorization Type [employer] 2023    Progress Note Due on Visit 10    PT Start Time 1635    PT Stop Time 1715    PT Time Calculation (min) 40 min    Activity Tolerance Patient tolerated treatment well    Behavior During Therapy WFL for tasks assessed/performed                Past Medical History:  Diagnosis Date   Adrenal nodule (HCC)    Anxiety    Arthritis    knees, left thumb, shoulders   Breast cancer ([place])    Left   Cervical spinal stenosis    Family history of breast cancer    GERD (gastroesophageal reflux disease)    History of hypothyroidism    Hyperlipidemia    poss history of   Hyperthyroidism    Osteopenia    PONV (postoperative nausea and vomiting)    after wisdom teeth   Pre-diabetes    Restless leg syndrome    Sleep apnea    Tremors of nervous system    Vertigo    Vestibular migraine    Past Surgical History:  Procedure Laterality Date   ANTERIOR / POSTERIOR COMBINED FUSION CERVICAL SPINE     BREAST BIOPSY Left 03/15/2021   Stereo bx, Ribbon Clip, path pending   BREAST LUMPECTOMY Left    COLONOSCOPY WITH PROPOFOL N/A 11/05/2020   Procedure: COLONOSCOPY WITH PROPOFOL;  Surgeon: Lucilla Lame, MD;  Location: [place];  Service: Endoscopy;  Laterality: N/A;   CYSTECTOMY Left    KNEE ARTHROSCOPY WITH EXCISION BAKER'S CYST Left    LAPAROSCOPY     Exploratory   POLYPECTOMY  11/05/2020   Procedure: POLYPECTOMY;  Surgeon: Lucilla Lame, MD;  Location: [place];  Service: Endoscopy;;   SHOULDER ARTHROSCOPY WITH ROTATOR CUFF REPAIR Left 12/01/2021    Procedure: Left shoulder arthroscopy, debridement, subacromial decompression, distal clavicle resection, chondroplasty;  Surgeon: Justice Britain, MD;  [facility];  Service: Orthopedics;  Laterality: Left;  51mn   TOTAL MASTECTOMY Bilateral    TOTAL SHOULDER ARTHROPLASTY Left 03/16/2022   Procedure: TOTAL SHOULDER ARTHROPLASTY;  Surgeon: SJustice Britain MD;  [facility];  Service: Orthopedics;  Laterality: Left;   [facility]    [facility]   TUBAL LIGATION     WISDOM TOOTH EXTRACTION     Patient Active Problem List   Diagnosis Date Noted   RLS (restless legs syndrome) 11/21/2021   Family history of breast cancer 04/12/2021   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/2021   Adhesive capsulitis of left shoulder 03/04/2021   Vestibular migraine 11/12/2020   Posterior vitreous detachment of both eyes 11/12/2020   Polyp of ascending colon    Pain in finger of left hand 08/30/2020   Vertiginous migraine 08/14/2020   Status post cervical spinal fusion 08/03/2020   Phonophobia 04/05/2020   Benign paroxysmal positional vertigo 04/05/2020   GAD (generalized anxiety disorder) 03/23/2020   [place] 03/23/2020   Type 2  diabetes mellitus with neurological complications ([place]) 37/85/8850   Prolapsed cervical intervertebral disc 01/21/2019   Rheumatoid factor positive 12/23/2018   Primary osteoarthritis of first carpometacarpal joint of left hand 09/01/2018   Pain in joints of left hand 09/01/2018   Chronic pain 01/07/2018   DJD (degenerative joint disease), cervical 12/19/2016   Controlled substance agreement signed 06/13/2016   Morbid obesity ([place]) 04/20/2015   Hyperlipidemia 03/23/2015   Hyperthyroidism 03/23/2015   Ulnar neuropathy 12/24/2014   Carpal tunnel syndrome 09/15/2014    PCP: Olivia Mackie Shuford PA-C   REFERRING PROVIDER: Dr. Park Liter   REFERRING DIAG: Left shoulder anatomic arthroplasty  THERAPY  DIAG:  Acute pain of left shoulder  Rationale for Evaluation and Treatment Rehabilitation  ONSET DATE: 03/16/22  SUBJECTIVE:                                                                                                                                                                                      SUBJECTIVE STATEMENT:  Pt reports some increased soreness in left shoulder, but she has had no difficulty with exercises.   PERTINENT HISTORY: Pt reports having left shoulder arthroplasty 2/2 to severe left shoulder OA on 03/16/22   PAIN:   Are you having pain? Yes: NPRS scale: 2/10 Pain location: Inside the left shoulder  Pain description: Sharp  Aggravating factors: Extending Shoulder and Reaching Down  Relieving factors: Not moving shoulder in contraindicated positions   PRECAUTIONS: Shoulder  Within 1-2 weeks. Flexion 0-90, External Rotation 30, Internal Rotation to abdomen, Abduction 0-60   WEIGHT BEARING RESTRICTIONS No  FALLS:  Has patient fallen in last 6 months? No  LIVING ENVIRONMENT: Lives with: lives with their spouse Lives in: House/apartment Stairs: Yes: Internal: 13 steps; on right going up and External: 6 steps; on left going up Has following equipment at home: None  OCCUPATION: Desk job   PLOF: Independent  PATIENT GOALS Improve ROM of shoulder    OBJECTIVE:   VITALS: 117/73 HR 83 SpO2 99  DIAGNOSTIC FINDINGS:  Imaging not listed   PATIENT SURVEYS:  FOTO 56/64  COGNITION: Overall cognitive status: Within functional limits for tasks assessed     SENSATION: WFL  POSTURE: Forward head and rounded shoulders   UPPER EXTREMITY ROM:   Passive ROM Right eval Left eval  Shoulder flexion 180 90  Shoulder extension 60   Shoulder abduction 180 90  Shoulder adduction    Shoulder internal rotation    Shoulder external rotation  30  Elbow flexion 150 150  Elbow extension 60 60  Wrist flexion 80 80  Wrist extension 70 70  Wrist ulnar  deviation 30 30  Wrist radial  deviation 20 20  Wrist pronation 80 80  Wrist supination 80 80  (Blank rows = not tested)          UPPER EXTREMITY MMT:  MMT Right eval Left eval  Shoulder flexion 5 NT  Shoulder extension  NT  Shoulder abduction 5 NT  Shoulder adduction    Shoulder internal rotation NT NT  Shoulder external rotation NT NT   Middle trapezius    Lower trapezius    Elbow flexion 5 NT  Elbow extension 5 NT  Wrist flexion 5 5  Wrist extension 5 5  Wrist ulnar deviation 5 5  Wrist radial deviation 5 5  Wrist pronation 5 5  Wrist supination 5 5  Grip strength (lbs) Good Good  (Blank rows = not tested)  JOINT MOBILITY TESTING:  Not assessed   PALPATION:  Not assessed    TODAY'S TREATMENT:   04/10/22  Shoulder AAROM Pulleys  Flexion/Extenson 3 x 10 only to 90 degrees  Shoulder AAROM Pulleys Abduction/Adduction 3 x 10 only to 90 degrees  Shoulder Flexion to 90 degrees using UE ranger 3 x 10 Shoulder Flexion to 100 degrees using UE ranger 3 x 10 Eccentric Biceps with Yellow Band 2 x 8 -Pt reports increased left shoulder pain as a result of exercise  Shoulder Abduction AAROM with pulleys 1 x 10  Shoulder Flexion AAROM with pulleys 1 x 10  Shoulder Horizontal Abduction at 90 deg 1 x 10  Shoulder Horizontal Abduction at 90 deg with yellow band 1 x 10  Shoulder Horizontal Abduction at 90 deg with red band 1 x 8     04/06/22:  THEREX:  Shoulder AAROM Pulleys  Flexion/Extenson 3 x 10 only to 90 degrees  Shoulder AAROM Pulleys Abduction/Adduction 3 x 10 only to 90 degrees  Seated Rows with Gray Band 3 x 10  Shoulder ER/IR at 0 degrees abduction with #2 DB 2 x 10 Shoulder ER/IR at 0 degrees abduction with red band 1 x 5 -Pt needed to terminate due to increased pain   Shoulder ER/IR at 0 degrees abduction with yellow band 1 x 10   Left Upper Trap Stretch 3 x 30 sec   MANUAL THERAPY    MFR of left upper trap and rhomboids using using massage chair    04/03/22:  TherEx:  Supine AAROM chest press with PVC pipe, 2x15 Supine AAROM flexion/extension to 110 deg on LUE, 2x15 Supine AAROM ER to 30 deg on L UE, 2x15   Manual:  Supine PROM of shoulder in all planes of mobility within protocol limits.  Pt does demonstrate some muscle guarding with PROM at times, but with verbal cuing is able to correct. Supine STM to cervical region to increase extensibility of the paraspinals Supine UT/Levator stretch, 30 sec bouts to increase tissue extensibility of the cervical region following muscle tightness from sling     PATIENT EDUCATION: Education details: form and technique for appropriate exercise  Person educated: Patient Education method: Explanation, Verbal cues, and Handouts Education comprehension: verbalized understanding and returned demonstration   HOME EXERCISE PROGRAM: Access Code: [email or web address] URL: [email or web address] Date: 04/10/2022 Prepared by: Bradly Chris  Exercises - Seated Shoulder Flexion AAROM with Pulley Behind  - 1 x daily - 7 x weekly - 3 sets - 10 reps - Seated Shoulder Scaption AAROM with Pulley at Side  - 1 x daily - 7 x weekly - 3 sets - 10 reps - Isometric Shoulder External Rotation at Wall  - 1 x daily -  3 x weekly - 3 sets - 10 reps - 3 sec hold - Isometric Shoulder Abduction at Wall  - 1 x daily - 3 x weekly - 3 sets - 10 reps - 3 sec  hold - Seated Isometric Elbow Flexion  - 1 x daily - 3 x weekly - 3 sets - 10 reps - 3 sec hold - Seated Shoulder Row with Anchored Resistance  - 1 x daily - 3 x weekly - 3 sets - 10 reps - Seated Gentle Upper Trapezius Stretch  - 1 x daily - 7 x weekly - 3 reps - 30-60 sec hold - Standing Eccentric Shoulder Flexion at Wall  - 1 x daily - 3 x weekly - 3 sets - 10 reps - Standing Shoulder Horizontal Abduction with Resistance  - 1 x daily - 3 x weekly - 3 sets - 8 reps  ASSESSMENT:  CLINICAL IMPRESSION:  Pt presents s/p 4 weeks post op left shoulder  arthroplasty exhibits improvement in parascapular strength and left shoulder ROM with ability to complete full ROM for abduction, flexion, and ER that is symmetrical to RUE. Shoulder exercises limited by lateral side of left shoulder near insertion of deltoid. Pt describes this as biceps, but movements do not indicate that this is the source of pain with pain elicited with shoulder ER in scapular plane and resisted shoulder extension. Possible lat dorsi pathology here but further testing needed. She is now at phase II of protocol with goal being to gradually restore AROM and to re-establish dynamic shoulder stability. Pt will continue to benefit from skilled therapy to address remaining deficits in order to improve overall QoL and return to PLOF.   OBJECTIVE IMPAIRMENTS decreased ROM, decreased strength, impaired UE functional use, and pain.   ACTIVITY LIMITATIONS carrying, lifting, bathing, toileting, dressing, reach over head, and hygiene/grooming  PARTICIPATION LIMITATIONS: cleaning, laundry, driving, occupation, and yard work  PERSONAL FACTORS Fitness, Time since onset of injury/illness/exacerbation, and 1 comorbidity: anxiety  are also affecting patient's functional outcome.   REHAB POTENTIAL: Good  CLINICAL DECISION MAKING: Stable/uncomplicated  EVALUATION COMPLEXITY: Low  GOALS: Goals reviewed with patient? No  SHORT TERM GOALS: Target date: 04/24/2022    Pt will be independent with HEP in order to improve strength and balance in order to decrease fall risk and improve function at home and work. Baseline: Performing independently  Goal status: Ongoing    LONG TERM GOALS: Target date: 06/19/2022  (Remove Blue Hyperlink)  Patient will have improved function and activity level as evidenced by an increase in FOTO score by 10 points or more.  Baseline: 56 Goal status: Not Met                         2.  Patient will improve left shoulder AROM symmetrical to right shoulder AROM for  improved UE function to return to completing ADLs with LUE. Baseline: Left shoulder flex 90, Shoulder ER 30, Shoulder Abduction 60  04/10/22: Left shoulder flexion and abduction 180, shoulder ER 70  Goal status: Achieved   3.  Patient will improve left shoulder strength to be the same as right shoulder for improved UE function to return to completing ADLs with LUE.  Baseline: NT because of ROM restrictions. Goal status: Not Met   PLAN: PT FREQUENCY: 1-2x/week  PT DURATION: 10 weeks  PLANNED INTERVENTIONS: Therapeutic exercises, Patient/Family education, Self Care, Joint mobilization, Joint manipulation, Aquatic Therapy, Dry Needling, Electrical stimulation, Cryotherapy, Moist heat, Manual  therapy, and Re-evaluation  PLAN FOR NEXT SESSION: Phase II of protocol. No limitations in PROM. Do not lift anything heavier than coffee cup and no supporting of body weight by LUE   Bradly Chris PT, DPT  04/10/22, 5:27 PM

## 2022-04-11 ENCOUNTER — Telehealth: Payer: Self-pay | Admitting: Physical Therapy

## 2022-04-11 NOTE — Telephone Encounter (Signed)
Pt called to report she is having severe left shoulder pain around deltoid. She states it is 7/10 on NPS scale and that this has been the worst it has been since surgery. PT advised pt to not perform exercises today and if she continues to feel same pain later in week that this would be reported to orthopedist for further evaluation and treatment.

## 2022-04-17 ENCOUNTER — Ambulatory Visit: Payer: Managed Care, Other (non HMO) | Admitting: Physical Therapy

## 2022-04-20 ENCOUNTER — Ambulatory Visit: Payer: Managed Care, Other (non HMO) | Admitting: Physical Therapy

## 2022-04-24 ENCOUNTER — Ambulatory Visit: Payer: Managed Care, Other (non HMO) | Admitting: Physical Therapy

## 2022-05-29 ENCOUNTER — Telehealth (INDEPENDENT_AMBULATORY_CARE_PROVIDER_SITE_OTHER): Payer: Managed Care, Other (non HMO) | Admitting: Family Medicine

## 2022-05-29 ENCOUNTER — Encounter: Payer: Self-pay | Admitting: Family Medicine

## 2022-05-29 VITALS — Temp 98.9°F

## 2022-05-29 DIAGNOSIS — U071 COVID-19: Secondary | ICD-10-CM

## 2022-05-29 MED ORDER — HYDROCOD POLI-CHLORPHE POLI ER 10-8 MG/5ML PO SUER: 5.0000 mL | Freq: Two times a day (BID) | ORAL | 0 refills | Status: DC | PRN

## 2022-05-29 MED ORDER — MOLNUPIRAVIR EUA 200MG CAPSULE
4.0000 | ORAL_CAPSULE | Freq: Two times a day (BID) | ORAL | 0 refills | Status: AC
Start: 2022-05-29 — End: 2022-06-03

## 2022-05-29 MED ORDER — PREDNISONE 50 MG PO TABS: 50.0000 mg | ORAL_TABLET | Freq: Every day | ORAL | 0 refills | Status: DC

## 2022-05-29 MED ORDER — DICYCLOMINE HCL 10 MG PO CAPS: 10.0000 mg | ORAL_CAPSULE | Freq: Three times a day (TID) | ORAL | 1 refills | Status: DC

## 2022-05-29 MED ORDER — BENZONATATE 200 MG PO CAPS: 200.0000 mg | ORAL_CAPSULE | Freq: Two times a day (BID) | ORAL | 0 refills | Status: DC | PRN

## 2022-05-29 NOTE — Progress Notes (Signed)
Temp 98.9 F (37.2 C)    Subjective:    Patient ID: Carol Becker, female    DOB: 04/02/1961, 61 y.o.   MRN: [id number]  HPI: Carol Becker is a 61 y.o. female  Chief Complaint  Patient presents with   Covid Positive    Patient says she tested positive for COVID on Saturday. Patient says she is having head/chest congestion, sore throat/hoarseness, low grade fever (98.9) and body-aches. Patient denies trying any over the counter medications.    UPPER RESPIRATORY TRACT INFECTION Duration: 3 days Worst symptom: body aches, fevers, cough, congestion Fever: yes Cough: yes Shortness of breath: yes Wheezing: yes Chest pain: no Chest tightness: no Chest congestion: yes Nasal congestion: yes Runny nose: yes Post nasal drip: yes Sneezing: no Sore throat: no Swollen glands: no Sinus pressure: yes Headache: yes Face pain: no Toothache: no Ear pain: yes  Ear pressure: no  Eyes red/itching:no Eye drainage/crusting: no  Vomiting: no Rash: no Fatigue: yes Sick contacts: no Strep contacts: no  Context: worse Recurrent sinusitis: no Relief with OTC cold/cough medications: no  Treatments attempted: cold/sinus   Relevant past medical, surgical, family and social history reviewed and updated as indicated. Interim medical history since our last visit reviewed. Allergies and medications reviewed and updated.  Review of Systems  Constitutional: Negative.   HENT:  Positive for congestion, postnasal drip, rhinorrhea, sinus pressure and sinus pain. Negative for dental problem, drooling, ear discharge, ear pain, facial swelling, hearing loss, mouth sores, nosebleeds, sneezing, sore throat, tinnitus, trouble swallowing and voice change.   Respiratory:  Positive for cough, chest tightness, shortness of breath and wheezing. Negative for apnea, choking and stridor.   Cardiovascular: Negative.   Gastrointestinal: Negative.   Musculoskeletal:  Positive for myalgias. Negative for  arthralgias, back pain, gait problem, joint swelling, neck pain and neck stiffness.  Neurological: Negative.   Psychiatric/Behavioral: Negative.      Per HPI unless specifically indicated above     Objective:    Temp 98.9 F (37.2 C)   Wt Readings from Last 3 Encounters:  03/16/22 197 lb (89.4 kg)  03/10/22 197 lb (89.4 kg)  03/07/22 200 lb (90.7 kg)    Physical Exam Vitals and nursing note reviewed.  Constitutional:      General: She is not in acute distress.    Appearance: Normal appearance. She is not ill-appearing, toxic-appearing or diaphoretic.  HENT:     Head: Normocephalic and atraumatic.     Right Ear: External ear normal.     Left Ear: External ear normal.     Nose: Nose normal.     Mouth/Throat:     Mouth: Mucous membranes are moist.     Pharynx: Oropharynx is clear.  Eyes:     General: No scleral icterus.       Right eye: No discharge.        Left eye: No discharge.     Conjunctiva/sclera: Conjunctivae normal.     Pupils: Pupils are equal, round, and reactive to light.  Pulmonary:     Effort: Pulmonary effort is normal. No respiratory distress.     Comments: Speaking in full sentences Musculoskeletal:        General: Normal range of motion.     Cervical back: Normal range of motion.  Skin:    Coloration: Skin is not jaundiced or pale.     Findings: No bruising, erythema, lesion or rash.  Neurological:     Mental Status: She is alert  and oriented to person, place, and time. Mental status is at baseline.  Psychiatric:        Mood and Affect: Mood normal.        Behavior: Behavior normal.        Thought Content: Thought content normal.        Judgment: Judgment normal.     Results for orders placed or performed during the hospital encounter of 03/16/22  Glucose, capillary  Result Value Ref Range   Glucose-Capillary 115 (H) 70 - 99 mg/dL      Assessment & Plan:   Problem List Items Addressed This Visit   None Visit Diagnoses     COVID-19    -   Primary   Will treat with molnopirovir and prednisone. Tussionex and tessalon for comfort. Call with any concerns. continue to monitor.   Relevant Medications   molnupiravir EUA (LAGEVRIO) 200 mg CAPS capsule        Follow up plan: Return if symptoms worsen or fail to improve.    This visit was completed via video visit through MyChart due to the restrictions of the COVID-19 pandemic. All issues as above were discussed and addressed. Physical exam was done as above through visual confirmation on video through MyChart. If it was felt that the patient should be evaluated in the office, they were directed there. The patient verbally consented to this visit. Location of the patient: home Location of the provider: work Those involved with this call:  Provider: Park Liter, DO CMA: Louanna Raw, [place] Desk/Registration: [employer]  Time spent on call:  15 minutes with patient face to face via video conference. More than 50% of this time was spent in counseling and coordination of care. 23 minutes total spent in review of patient's record and preparation of their chart.

## 2022-06-14 NOTE — Progress Notes (Signed)
Referring:  Dorcas Carrow, DO [address] [place],  [place] [address]  PCP: Dorcas Carrow, DO  Neurology was asked to evaluate Carol Becker, a 61 for a chief complaint of headaches.  Our recommendations of care will be communicated by shared medical record.    CC:  vertigo Chief Complaint  Patient presents with   Room 2    Room 2. Pt is here with her Husband. Pt states that she hasn't had any dizziness. Pt states she hasn't had any migraines at all. Pt states no nausea.       History provided from self, husband  HPI:   Consult visit 03/07/2022 Dr. Delena Bali: Medical co-morbidities: DM2, hyperthyroidism, anxiety  The patient presents for evaluation of vertigo which began several years ago. This worsened when she had a lumpectomy in 2022. She has been diagnosed with vestibular migraine, though she reports she has never had a headache associated with it. She does report a history of "sinus headaches". Vertigo can last for several hours at a time. It is triggered by busy visual patterns, like grocery stores with multiple items in the aisles. Can be triggered by riding in the car when she does not know which direction it will turn. Vertigo can last several hours and she will sometimes need to sleep it off. Vertigo is associated with nausea, and she takes dramamine and meclizine as needed. Tried Zofran previously which was ineffective. She takes nortriptyline at bedtime which has helped reduce the frequency of her vertigo. She is also on Effexor for anxiety.  She has a history of BPPV which feels different from her current vertigo. This vertigo is positional and resolves with Epley maneuver. Did vestibular therapy previously including visual fixation exercises, which she did not find helpful.  She has also started to get intermittently disoriented. Has gotten lost while she was driving. Just last week went to the restroom at [facility] and completely forgot where she was.  Also notices a tremor  with fine motor movements which occurs when she overexerts herself. Of note, recent TSH was <0.01 and she is currently having her thyroid medications adjusted.  Vision has gotten blurry over the past year. Initially was intermittent but now it is all the time.   Update 06/15/2022 JM: Patient returns for 88-month follow-up accompanied by her husband.  At prior visit, venlafaxine dosage decreased to 150mg  and increase nortriptyline dosage to 50mg  for possible vestibular migraines, also provided Ubrelvy samples to take at onset of vertigo attacks.    Reports since prior visit, she has not experienced any migraine headaches or vertigo episodes.  She has been tolerating medication dosage adjustments well.  She has not tried [place] as she has not had any additional episodes.   Prior complaints of blurry vision, diagnosed with bilateral cataracts which were removed in October, no longer has blurred vision.  Does have continued cognitive complaints.  Continues to get disoriented while driving.  Has been present over the past 4 to 5 months.  She has been gradually decreasing Klonopin dosage and is hopeful to be able to completely stop by the end of this week.  She is also completely discontinued BuSpar.  Denies any progression since onset.  Of note, prior TSH was <0.01 and is currently awaiting to be seen by [facility] endocrinology for further management but does not have initial evaluation until May.     Current Treatment: Abortive Dramamine Meclizine Bernita Raisin   Preventative Nortriptyline 50 mg QHS Effexor 150 mg daily  Prior Therapies                                 Effexor 225 mg daily Nortriptyline 30 mg QHS Gabapentin 600/1200 Seroquel - tremor    LABS: CBC    Component Value Date/Time   WBC 7.9 03/10/2022 1050   RBC 4.57 03/10/2022 1050   HGB 13.2 03/10/2022 1050   HGB 12.6 11/18/2021 1005   HCT 41.2 03/10/2022 1050   HCT 38.3 11/18/2021 1005   PLT 316 03/10/2022 1050   PLT 311  11/18/2021 1005   MCV 90.2 03/10/2022 1050   MCV 86 11/18/2021 1005   MCH 28.9 03/10/2022 1050   MCHC 32.0 03/10/2022 1050   RDW 13.6 03/10/2022 1050   RDW 12.8 11/18/2021 1005   LYMPHSABS 1.4 11/18/2021 1005   EOSABS 0.1 11/18/2021 1005   BASOSABS 0.0 11/18/2021 1005      Latest Ref Rng & Units 03/10/2022   10:50 AM 11/18/2021   10:05 AM 02/11/2021    3:21 PM  CMP  Glucose 70 - 99 mg/dL 220  254  270   BUN 6 - 20 mg/dL 18  13  13    Creatinine 0.44 - 1.00 mg/dL 6.23  7.62  8.31   Sodium 135 - 145 mmol/L 139  141  141   Potassium 3.5 - 5.1 mmol/L 4.7  4.4  4.3   Chloride 98 - 111 mmol/L 105  104  102   CO2 22 - 32 mmol/L 28  23  24    Calcium 8.9 - 10.3 mg/dL 9.8  9.5  9.6   Total Protein 6.0 - 8.5 g/dL  7.1  7.1   Total Bilirubin 0.0 - 1.2 mg/dL  0.3  0.2   Alkaline Phos 44 - 121 IU/L  109  99   AST 0 - 40 IU/L  26  16   ALT 0 - 32 IU/L  29  29      IMAGING:  MRI brain 09/03/20: Brain unremarkable. 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   Imaging independently reviewed on June 15, 2022   Current Outpatient Medications on File Prior to Visit  Medication Sig Dispense Refill   diclofenac (VOLTAREN) 75 MG EC tablet Take 1 tablet (75 mg total) by mouth 2 (two) times daily. 60 tablet 1   dicyclomine (BENTYL) 10 MG capsule Take 1 capsule (10 mg total) by mouth 4 (four) times daily -  before meals and at bedtime. 120 capsule 1   gabapentin (NEURONTIN) 600 MG tablet Take 1 pill in the AM and 2 pills in the PM (Patient taking differently: Take 600-1,200 mg by mouth See admin instructions. Take 1 pill in the AM ( 600 mg)  and 2 pills ( 1200 mg) in the PM) 270 tablet 1   methimazole (TAPAZOLE) 5 MG tablet Take 7.5 mg by mouth daily. Taking 1 & 1/2 tablets daily     nortriptyline (PAMELOR) 50 MG capsule Take 1 capsule (50 mg total) by mouth at bedtime. 60 capsule 11   omeprazole (PRILOSEC) 20 MG capsule Take 20 mg by mouth daily.      Semaglutide, 1 MG/DOSE, 4 MG/3ML SOPN Inject 1 mg as directed once a week. 9 mL 1   venlafaxine XR (EFFEXOR-XR) 75 MG 24 hr capsule Take 150 mg by mouth at bedtime.     acetaminophen (TYLENOL) 500 MG tablet Take 1,000 mg by mouth  every 6 (six) hours as needed for mild pain. (Patient not taking: Reported on 06/15/2022)     benzonatate (TESSALON) 200 MG capsule Take 1 capsule (200 mg total) by mouth 2 (two) times daily as needed for cough. 20 capsule 0   busPIRone (BUSPAR) 15 MG tablet TAKE 1 TO 2 TABLETS BY MOUTH THREE TIMES DAILY 270 tablet 0   chlorpheniramine-HYDROcodone (TUSSIONEX) 10-8 MG/5ML Take 5 mLs by mouth every 12 (twelve) hours as needed for cough. 50 mL 0   clonazePAM (KLONOPIN) 0.5 MG tablet Take 0.25 mg by mouth 2 (two) times daily.     cyclobenzaprine (FLEXERIL) 10 MG tablet Take 1 tablet (10 mg total) by mouth 3 (three) times daily as needed for muscle spasms. (Patient not taking: Reported on 05/29/2022) 30 tablet 1   ondansetron (ZOFRAN) 4 MG tablet Take 1 tablet (4 mg total) by mouth every 8 (eight) hours as needed for nausea or vomiting. (Patient not taking: Reported on 03/29/2022) 10 tablet 0   oxyCODONE-acetaminophen (PERCOCET) 5-325 MG tablet Take 1 tablet by mouth every 4 (four) hours as needed (max 6 q). (Patient not taking: Reported on 03/29/2022) 20 tablet 0   predniSONE (DELTASONE) 50 MG tablet Take 1 tablet (50 mg total) by mouth daily with breakfast. 5 tablet 0   No current facility-administered medications on file prior to visit.     Allergies: Allergies  Allergen Reactions   Crestor [Rosuvastatin Calcium] Other (See Comments)    Indigestion   Sulfa Antibiotics Nausea Only   Zithromax [Azithromycin] Other (See Comments)    Not effective    Family History: Migraine or other headaches in the family:  no Aneurysms in a first degree relative:  no Brain tumors in the family:  no Other neurological illness in the family:   Alzheimer's  Past Medical  History: Past Medical History:  Diagnosis Date   Adrenal nodule (HCC)    Anxiety    Arthritis    knees, left thumb, shoulders   Breast cancer (HCC)    Left   Cervical spinal stenosis    Family history of breast cancer    GERD (gastroesophageal reflux disease)    History of hypothyroidism    Hyperlipidemia    poss history of   Hyperthyroidism    Osteopenia    PONV (postoperative nausea and vomiting)    after wisdom teeth   Pre-diabetes    Restless leg syndrome    Sleep apnea    Tremors of nervous system    Vertigo    Vestibular migraine     Past Surgical History Past Surgical History:  Procedure Laterality Date   ANTERIOR / POSTERIOR COMBINED FUSION CERVICAL SPINE     BREAST BIOPSY Left 03/15/2021   Stereo bx, Ribbon Clip, path pending   BREAST LUMPECTOMY Left    COLONOSCOPY WITH PROPOFOL N/A 11/05/2020   Procedure: COLONOSCOPY WITH PROPOFOL;  Surgeon: Midge Minium, MD;  Location: [facility];  Service: Endoscopy;  Laterality: N/A;   CYSTECTOMY Left    KNEE ARTHROSCOPY WITH EXCISION BAKER'S CYST Left    LAPAROSCOPY     Exploratory   POLYPECTOMY  11/05/2020   Procedure: POLYPECTOMY;  Surgeon: Midge Minium, MD;  Location: [facility];  Service: Endoscopy;;   SHOULDER ARTHROSCOPY WITH ROTATOR CUFF REPAIR Left 12/01/2021   Procedure: Left shoulder arthroscopy, debridement, subacromial decompression, distal clavicle resection, chondroplasty;  Surgeon: Francena Hanly, MD;  Location: [facility];  Service: Orthopedics;  Laterality: Left;    TOTAL MASTECTOMY Bilateral  TOTAL SHOULDER ARTHROPLASTY Left 03/16/2022   Procedure: TOTAL SHOULDER ARTHROPLASTY;  Surgeon: Francena Hanly, MD;  Location: [facility];  Service: Orthopedics;  Laterality: Left;   trapeziectomy Left    done at [facility] 06/16/20   TUBAL LIGATION     WISDOM TOOTH EXTRACTION      Social History: Social History   Tobacco Use   Smoking status: Former    Types: Cigarettes    Quit date: 04/06/2011     Years since quitting: 11.2   Smokeless tobacco: Never  Vaping Use   Vaping Use: Former  Substance Use Topics   Alcohol use: Not Currently    Comment: On Occasion   Drug use: No    ROS: Negative for fevers, chills. Positive for vertigo. All other systems reviewed and negative unless stated otherwise in HPI.   Physical Exam:   Vital Signs: BP 118/77 (BP Location: Right Arm, Patient Position: Sitting, Cuff Size: Normal)   Pulse 83   Ht 5\' 4"  (1.626 m)   Wt 205 lb 3.2 oz (93.1 kg)   BMI 35.22 kg/m  GENERAL: well appearing,in no acute distress,alert SKIN:  Color, texture, turgor normal. No rashes or lesions HEAD:  Normocephalic/atraumatic. CV:  RRR RESP: Normal respiratory effort MSK: no tenderness to palpation over occiput, neck, or shoulders  NEUROLOGICAL: Mental Status: Alert, oriented to person, place and time, subjective short-term memory loss, follows commands Cranial Nerves: PERRL, visual fields intact to confrontation, extraocular movements intact, facial sensation intact, no facial droop or ptosis, hearing grossly intact, no dysarthria Motor: muscle strength 5/5 both upper and lower extremities. No tremor Reflexes: 2+ throughout Sensation: intact to light touch all 4 extremities Coordination: Finger-to- nose-finger intact bilaterally Gait: normal-based     IMPRESSION: 61 year old female with a history of DM2, hyperthyroidism, anxiety who presents for evaluation of vertigo, initial consult visit with Dr. Delena Bali on 03/07/2022. Per Dr. Delena Bali "While vertigo is not associated with headache, she does report a history of 'sinus headaches'. Associated phonophobia and prolonged duration of vertigo episodes does raise suspicion for vestibular migraines."  She has not had any additional vertigo or migraines since increasing nortriptyline dosage and decreasing venlafaxine dosage at prior visit.  She does complain of continued cognitive difficulties which have been nonprogressive over  the past 4 to 5 months.  Suspect her uncontrolled hyperthyroidism is contributing to cognitive impairment.  B12 level WNL.    PLAN: -Continue nortriptyline 50 mg nightly and venlafaxine 150 mg daily -refills provided -can use Ubrelvy with any recurrent vertigo/migraine episode  -encouraged her to contact [place] endocrinology to be put on a cancellation list for evaluation of hyperthyroidism as she does not have initial eval until May 2024   Follow-up in 8 months or call earlier if needed   I spent 37 minutes of face-to-face and non-face-to-face time with patient and husband.  This included previsit chart review, lab review, study review, order entry, electronic health record documentation, patient and husband education and discussion regarding the above and answered all the questions to patient and husband satisfaction  Ihor Austin, AGNP-BC  [facility] [address] [place], [place] [address]  Phone [phone] Fax [phone] Note: This document was prepared with digital dictation and possible smart phrase technology. Any transcriptional errors that result from this process are unintentional.

## 2022-06-15 ENCOUNTER — Ambulatory Visit: Payer: Managed Care, Other (non HMO) | Admitting: Adult Health

## 2022-06-15 ENCOUNTER — Encounter: Payer: Self-pay | Admitting: Adult Health

## 2022-06-15 VITALS — BP 118/77 | HR 83 | Ht 64.0 in | Wt 205.2 lb

## 2022-06-15 DIAGNOSIS — G43809 Other migraine, not intractable, without status migrainosus: Secondary | ICD-10-CM

## 2022-06-15 DIAGNOSIS — R413 Other amnesia: Secondary | ICD-10-CM | POA: Diagnosis not present

## 2022-06-15 DIAGNOSIS — E0590 Thyrotoxicosis, unspecified without thyrotoxic crisis or storm: Secondary | ICD-10-CM

## 2022-06-15 MED ORDER — VENLAFAXINE HCL ER 150 MG PO CP24: 150.0000 mg | ORAL_CAPSULE | Freq: Every day | ORAL | 3 refills | Status: DC

## 2022-06-15 MED ORDER — NORTRIPTYLINE HCL 50 MG PO CAPS
50.0000 mg | ORAL_CAPSULE | Freq: Every day | ORAL | 3 refills | Status: DC
Start: 2022-06-15 — End: 2022-11-27

## 2022-06-15 NOTE — Patient Instructions (Addendum)
Your Plan:  Continue nortriptyline 50 mg nightly  Continue Effexor 150mg  daily   Use of Ubrelvy as needed for acute migraine or vertigo episodes   Your thyroid levels are likely contributing to your memory loss concerns, would recommend you contact endo to get put on a wait list to be seen sooner than May     Follow up in 8 months or call earlier if needed       Thank you for coming to see [place] at [facility]. I hope we have been able to provide you high quality care today.  You may receive a patient satisfaction survey over the next few weeks. We would appreciate your feedback and comments so that we may continue to improve ourselves and the health of our patients.

## 2022-07-07 ENCOUNTER — Encounter: Payer: Self-pay | Admitting: Family Medicine

## 2022-07-26 ENCOUNTER — Telehealth (INDEPENDENT_AMBULATORY_CARE_PROVIDER_SITE_OTHER): Payer: Managed Care, Other (non HMO) | Admitting: Family Medicine

## 2022-07-26 ENCOUNTER — Encounter: Payer: Self-pay | Admitting: Family Medicine

## 2022-07-26 DIAGNOSIS — R1011 Right upper quadrant pain: Secondary | ICD-10-CM

## 2022-07-26 DIAGNOSIS — K8020 Calculus of gallbladder without cholecystitis without obstruction: Secondary | ICD-10-CM

## 2022-07-26 NOTE — Progress Notes (Signed)
There were no vitals taken for this visit.   Subjective:    Patient ID: Carol Becker, female    DOB: Aug 30, 1960, 62 y.o.   MRN: [id number]  HPI: Carol Becker is a 62 y.o. female  Chief Complaint  Patient presents with   Abdominal Pain    Patient says she is experiencing bad stomach aches and says it is every day, but not all day. Patient says she this is ongoing issue and says she has seen GI in the past.    Has been having a lot of issues with brain fog- seeing a new neurologist. Has had her medicines adjusted- has stopped her klonopin. Started on her buspar and that seems like it helps with the belly pain  ABDOMINAL PAIN  Duration:2-3 weeks Onset: sudden Severity: severe Quality: spasm Location:  epigastric  Episode duration: hours Radiation: no Frequency: 2x a month Status: better Treatments attempted: hot water and vinegar, ppi, antacid Fever: no Nausea: no Vomiting: no Weight loss: no Decreased appetite: no Diarrhea: no Constipation: yes Blood in stool: no Heartburn: no Jaundice: no Rash: no Dysuria/urinary frequency: no Hematuria: no History of sexually transmitted disease: no Recurrent NSAID use: no  Relevant past medical, surgical, family and social history reviewed and updated as indicated. Interim medical history since our last visit reviewed. Allergies and medications reviewed and updated.  Review of Systems  Constitutional: Negative.   Respiratory: Negative.    Cardiovascular: Negative.   Gastrointestinal:  Positive for abdominal pain. Negative for abdominal distention, anal bleeding, blood in stool, constipation, diarrhea, nausea, rectal pain and vomiting.  Musculoskeletal: Negative.   Neurological: Negative.   Psychiatric/Behavioral: Negative.      Per HPI unless specifically indicated above     Objective:    There were no vitals taken for this visit.  Wt Readings from Last 3 Encounters:  06/15/22 205 lb 3.2 oz (93.1 kg)   03/16/22 197 lb (89.4 kg)  03/10/22 197 lb (89.4 kg)    Physical Exam Vitals and nursing note reviewed.  Constitutional:      General: She is not in acute distress.    Appearance: Normal appearance. She is not ill-appearing, toxic-appearing or diaphoretic.  HENT:     Head: Normocephalic and atraumatic.     Right Ear: External ear normal.     Left Ear: External ear normal.     Nose: Nose normal.     Mouth/Throat:     Mouth: Mucous membranes are moist.     Pharynx: Oropharynx is clear.  Eyes:     General: No scleral icterus.       Right eye: No discharge.        Left eye: No discharge.     Conjunctiva/sclera: Conjunctivae normal.     Pupils: Pupils are equal, round, and reactive to light.  Pulmonary:     Effort: Pulmonary effort is normal. No respiratory distress.     Comments: Speaking in full sentences Musculoskeletal:        General: Normal range of motion.     Cervical back: Normal range of motion.  Skin:    Coloration: Skin is not jaundiced or pale.     Findings: No bruising, erythema, lesion or rash.  Neurological:     Mental Status: She is alert and oriented to person, place, and time. Mental status is at baseline.  Psychiatric:        Mood and Affect: Mood normal.        Behavior: Behavior  normal.        Thought Content: Thought content normal.        Judgment: Judgment normal.     CT abdomen without IV contrast  Comparison: CT abdomen and pelvis angiogram with and without contrast March 12, 2022.  Indication: Adrenal mass, 1-4cm, incidental, no history of malignancy, Follow up CT 2/3 with incidental LEFT adrenal mass 1.5 cm, E27.8 Other specified disorders of adrenal gland (CMS-HCC).  Technique:  CT imaging of the abdomen was performed without intravenous contrast. Coronal and sagittal reformatted images were generated and reviewed.  Findings: Limited assessment given the lack of IV contrast, particularly of the solid organs, bowel, and  vasculature.  - Liver:  Unremarkable noncontrast appearance.  - Biliary and Gallbladder: No intrahepatic or extrahepatic bile duct dilatation. Small gallstones within the gallbladder.  - Spleen: Normal in size.    - Pancreas: Unremarkable noncontrast appearance.  - Adrenal Glands: Left adrenal nodule measuring approximately 1.3 x 1.3 cm and 6.9 HU. The right adrenal gland is unremarkable.  - Kidneys:   No renal stones. No hydronephrosis. The kidneys are symmetric in size and position.  - Abdominal Vasculature: No abdominal aortic aneurysm. No significant atherosclerotic plaque.  - Gastrointestinal Tract: No evidence of bowel obstruction.    - Peritoneum/Mesentery/Retroperitoneum: No free fluid.  No free intraperitoneal air.    - Lymph Nodes: No retroperitoneal or mesenteric lymphadenopathy.    - Body Wall: Unremarkable.  - Musculoskeletal:  No aggressive appearing osseous lesions.  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  Electronically Reviewed by:  Tonna Corner, MD, [facility] Electronically Reviewed on:  02/13/2022 4:16 PM  I have reviewed the images and concur with the above findings.  Electronically Signed by:  Robinette Haines, MD, [facility] Electronically Signed on:  02/13/2022 5:01 PM Procedure Note  Alveta Heimlich, MD - 02/13/2022 Formatting of this note might be different from the original. CT abdomen without IV contrast  Comparison: CT abdomen and pelvis angiogram with and without contrast March 12, 2022.  Indication: Adrenal mass, 1-4cm, incidental, no history of malignancy, Follow up CT 2/3 with incidental LEFT adrenal mass 1.5 cm, E27.8 Other specified disorders of adrenal gland (CMS-HCC).  Technique:  CT imaging of the abdomen was performed without intravenous contrast. Coronal and sagittal reformatted images were generated and reviewed.  Findings: Limited  assessment given the lack of IV contrast, particularly of the solid organs, bowel, and vasculature.  - Liver:  Unremarkable noncontrast appearance.  - Biliary and Gallbladder: No intrahepatic or extrahepatic bile duct dilatation. Small gallstones within the gallbladder.  - Spleen: Normal in size.    - Pancreas: Unremarkable noncontrast appearance.  - Adrenal Glands: Left adrenal nodule measuring approximately 1.3 x 1.3 cm and 6.9 HU. The right adrenal gland is unremarkable.  - Kidneys:   No renal stones. No hydronephrosis. The kidneys are symmetric in size and position.  - Abdominal Vasculature: No abdominal aortic aneurysm. No significant atherosclerotic plaque.  - Gastrointestinal Tract: No evidence of bowel obstruction.    - Peritoneum/Mesentery/Retroperitoneum: No free fluid.  No free intraperitoneal air.    - Lymph Nodes: No retroperitoneal or mesenteric lymphadenopathy.    - Body Wall: Unremarkable.  - Musculoskeletal:  No aggressive appearing osseous lesions.  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  Results for orders placed or performed during the hospital encounter of 03/16/22  Glucose, capillary  Result Value Ref Range   Glucose-Capillary 115 (H) 70 - 99 mg/dL  Assessment & Plan:   Problem List Items Addressed This Visit   None Visit Diagnoses     Gall stones    -  Primary   Found on CT in August. Having pain. Referral to general surgery placed.   Relevant Orders   Ambulatory referral to General Surgery   RUQ pain       Gall stones found on CT in August. Having pain. Referral to general surgery placed.   Relevant Orders   Ambulatory referral to General Surgery        Follow up plan: Return as scheduled.   This visit was completed via video visit through MyChart due to the restrictions of the COVID-19 pandemic. All issues as above were discussed and addressed. Physical exam was done as above  through visual confirmation on video through MyChart. If it was felt that the patient should be evaluated in the office, they were directed there. The patient verbally consented to this visit. Location of the patient: home Location of the provider: work Those involved with this call:  Provider: Olevia Perches, DO CMA: Malen Gauze, CMA Front Desk/Registration: [employer]  Time spent on call:  15 minutes with patient face to face via video conference. More than 50% of this time was spent in counseling and coordination of care. 23 minutes total spent in review of patient's record and preparation of their chart.

## 2022-07-27 ENCOUNTER — Encounter: Payer: Self-pay | Admitting: Internal Medicine

## 2022-07-28 ENCOUNTER — Encounter: Payer: Self-pay | Admitting: Family Medicine

## 2022-08-03 ENCOUNTER — Other Ambulatory Visit: Payer: Self-pay | Admitting: Family Medicine

## 2022-08-03 NOTE — Telephone Encounter (Signed)
Requested Prescriptions  Pending Prescriptions Disp Refills   dicyclomine (BENTYL) 10 MG capsule [Pharmacy Med Name: DICYCLOMINE 10MG  CAPSULES] 120 capsule 2    Sig: TAKE 1 CAPSULE(10 MG) BY MOUTH FOUR TIMES DAILY BEFORE MEALS AND AT BEDTIME     Gastroenterology:  Antispasmodic Agents Passed - 08/03/2022  6:23 AM      Passed - Valid encounter within last 12 months    Recent Outpatient Visits           1 week ago Gall stones   [place], [place], DO   2 months ago [place], [place], DO   5 months ago Type 2 diabetes mellitus with neurological complications [facility])   [place], Megan P, DO   8 months ago Diabetes mellitus type 2 with neurological manifestations [facility])   [place], Megan P, DO   1 year ago Diabetes mellitus without complication [facility])   [place], [place], DO       Future Appointments             In 6 days [place], Melanie Crazier, MD [facility]   In 4 weeks Valerie Roys, DO [place]

## 2022-08-09 ENCOUNTER — Ambulatory Visit: Payer: Self-pay | Admitting: Surgery

## 2022-08-09 ENCOUNTER — Ambulatory Visit: Payer: Managed Care, Other (non HMO) | Admitting: Internal Medicine

## 2022-08-09 ENCOUNTER — Other Ambulatory Visit: Payer: Managed Care, Other (non HMO)

## 2022-08-09 ENCOUNTER — Encounter: Payer: Self-pay | Admitting: Internal Medicine

## 2022-08-09 VITALS — BP 118/80 | HR 91 | Ht 64.0 in | Wt 204.2 lb

## 2022-08-09 DIAGNOSIS — E0590 Thyrotoxicosis, unspecified without thyrotoxic crisis or storm: Secondary | ICD-10-CM | POA: Diagnosis not present

## 2022-08-09 DIAGNOSIS — D3502 Benign neoplasm of left adrenal gland: Secondary | ICD-10-CM | POA: Diagnosis not present

## 2022-08-09 LAB — COMPREHENSIVE METABOLIC PANEL
ALT: 22 U/L (ref 0–35)
AST: 23 U/L (ref 0–37)
Albumin: 4.5 g/dL (ref 3.5–5.2)
Alkaline Phosphatase: 100 U/L (ref 39–117)
BUN: 10 mg/dL (ref 6–23)
CO2: 29 mEq/L (ref 19–32)
Calcium: 9.6 mg/dL (ref 8.4–10.5)
Chloride: 102 mEq/L (ref 96–112)
Creatinine, Ser: 0.92 mg/dL (ref 0.40–1.20)
GFR: 67.7 mL/min (ref >60.00)
Glucose, Bld: 88 mg/dL (ref 70–99)
Potassium: 4.3 mEq/L (ref 3.5–5.1)
Sodium: 137 mEq/L (ref 135–145)
Total Bilirubin: 0.3 mg/dL (ref 0.2–1.2)
Total Protein: 7.7 g/dL (ref 6.0–8.3)

## 2022-08-09 LAB — T4, FREE: Free T4: 0.74 ng/dL (ref 0.60–1.60)

## 2022-08-09 LAB — TSH: TSH: 0.91 u[IU]/mL (ref 0.35–5.50)

## 2022-08-09 MED ORDER — METHIMAZOLE 5 MG PO TABS: 7.5000 mg | ORAL_TABLET | Freq: Every day | ORAL | 2 refills | Status: DC

## 2022-08-09 NOTE — Patient Instructions (Signed)

## 2022-08-09 NOTE — H&P (Signed)
History of Present Illness: Carol Becker is a 62 y.o. female who was referred to me for evaluation of gallstones. She reports that for about 20 years now, she has been having abdominal pain, which is getting worse. The pain is in the epigastric area, and initially occurred only after eating greasy foods. It now happens more frequently, and she has several episodes a month. During an attack she says the pain is very severe and is associated with nausea. No fevers. She had a CT scan on 02/13/22 for workup of a left adrenal nodule (1.5cm consistent with benign adenoma, stable from prior), and this also showed gallstones. She also reports that she had a HIDA scan at [facility], and had the same symptoms during the HIDA, but was told the study was normal (we do not have the HIDA results).   She reports having a diagnostic laparoscopy many years ago but no other intraabdominal surgeries. 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     Review of Systems: A complete review of systems was obtained from the patient.  I have reviewed this information and discussed as appropriate with the patient.  See HPI as well for other ROS.       Medical History: Past Medical History Past Medical History: Diagnosis Date  Breast disorder September 2022   DCIS  Depression    Diarrhea in adult patient 03/28/2017  DJD (degenerative joint disease) of cervical spine    GERD (gastroesophageal reflux disease)    History of cancer     left breast  Hyperlipidemia, unspecified    Motion sickness    Obesity (BMI 30-39.9), unspecified    OSA on CPAP 2013   Per pt no current sxs. Stopped CPAP 2018.  Prediabetes    Restless legs    Sleep apnea    Thyroid activity decreased    Vertiginous migraine    Vertigo        Patient Active Problem List Diagnosis  Degeneration of cervical intervertebral disc  Hyperlipidemia  Obesity  Thyroid activity decreased  Prediabetes  Diarrhea  in adult patient  Primary osteoarthritis of first carpometacarpal joint of left hand  Pain in joints of left hand  Rheumatoid factor positive  History of depression  Drug-induced tremor  Benign paroxysmal positional vertigo  Phonophobia  Dizziness  Vertiginous migraine  Nausea  Pain in finger of left hand  Anxiety, generalized  Ductal carcinoma in situ (DCIS) of left breast  OSA on CPAP     Past Surgical History Past Surgical History: Procedure Laterality Date  2 cervical disks replaced   10/2014   and fusion  REVISION WRIST ARTHROPLASTY Left 06/16/2020   Procedure: LEFT ARTHROPLASTY, INTERPOSITION, INTERCARPAL OR CARPOMETACARPAL JOINTS;  Surgeon: Aline Brochure, MD;  [facility];  Service: Plastic Surgery;  Laterality: Left;  GRAFT TENDON Left 06/16/2020   Procedure: LEFT TENDON GRAFT, FROM A DISTANCE ( [place]MARIS);  Surgeon: Aline Brochure, MD;  Location: [facility];  Service: Plastic Surgery;  Laterality: Left;  CARPECTOMY Left 06/16/2020   Procedure: LEFT CARPECTOMY; 1 BONE;  Surgeon: Aline Brochure, MD;  [facility];  Service: Plastic Surgery;  Laterality: Left;  MASTECTOMY PARTIAL Left 05/05/2021   Procedure: Left MASTECTOMY, PARTIAL [place];  Surgeon: Sheliah Hatch, MD;  Location[facility];  Service: General Surgery;  Laterality: Left;  MASTECTOMY PARTIAL / LUMPECTOMY Left 05/05/2021  MASTECTOMY PARTIAL Left 05/27/2021   Procedure: Left MASTECTOMY, PARTIAL;  Surgeon: Sheliah Hatch,  MD;  Location[facility];  Service: General Surgery;  Laterality: Left;  shoulder surgery Left 2023  RECONSTRUCTION BREAST W/FREE FLAP Bilateral 08/17/2021   Procedure: **FREE FLAP** - BREAST RECONSTRUCTION; WITH FREE FLAP (EG, FTRAM, DIEP, SIEA, GAP FLAP);  Surgeon: Allena Katz, MD;  Location: [facility];  Service: Plastic Surgery;  Laterality: Bilateral;  INTRAVENOUS INJECTION TO TEST BLOOD FLOW IN FLAP/GRAFT N/A 08/17/2021    Procedure: INTRAVENOUS INJECTION OF AGENT (EG, FLUORESCEIN) TO TEST VASCULAR FLOW IN FLAP OR GRAFT;  Surgeon: Allena Katz, MD;  Location: [facility];  Service: Plastic Surgery;  Laterality: N/A;  MASTECTOMY BILATERAL SIMPLE Bilateral 08/17/2021   Procedure: BILATERAL MASTECTOMY, SIMPLE, COMPLETE;  Surgeon: Sheliah Hatch, MD;  Location: [facility];  Service: General Surgery;  Laterality: Bilateral;  BIOPSY/EXCISION LYMPH NODE AXILLARY Left 08/17/2021   Procedure: BIOPSY OR EXCISION OF LYMPH NODE(S); OPEN, DEEP AXILLARY NODE(S);  Surgeon: Sheliah Hatch, MD;  Location: [facility];  Service: General Surgery;  Laterality: Left;  REVISION OF RECONSTRUCTED BREAST Bilateral 02/16/2022   Procedure: BIL - REVISION OF RECONSTRUCTED BREAST (EG, SIGNIFICANT REMOVAL TISSUE/RE-ADVANCEMENT AND/OR RE-INSET FLAPS AUTOLOGOUS RECON/SIGNIFICANT CAPSULAR REVISION COMBINED W/ SOFT TISSUE EXCISE IMPLANT-BASED RECON);  Surgeon: Allena Katz, MD;  Location: [facility];  Service: Plastic Surgery;  Laterality: Bilateral;  REPAIR WOUND INTERMEDIATE SCALP/AXILLA/TRUNK/EXTREMITY Bilateral 02/16/2022   Procedure: BIL - REPAIR, INTERMEDIATE, WOUND OF SCALP/AXILLA/TRUNK/EXTREMITY (EXCLUDING HAND AND FOOT); 7.6 CM TO 12.5 CM;  Surgeon: Allena Katz, MD;  Location: [facility];  Service: Plastic Surgery;  Laterality: Bilateral;  trunk  EXCISION BENIGN SKIN LESION TRUNK/ARM/LEG Bilateral 02/16/2022   Procedure: BIL - EXCISION, BENIGN LESION INCLUDING MARGINS, EXCEPT SKIN TAG (UNLESS LISTED ELSEWHERE), TRUNK, ARM, OR LEG; EXCISED DIAMETER OVER 4.0 CM;  Surgeon: Allena Katz, MD;  Location: [facility];  Service: Plastic Surgery;  Laterality: Bilateral;  trunk  EXTRACTION CATARACT EXTRACAPSULAR W/INSERTION INTRAOCULAR PROSTHESIS Right 04/12/2022   Procedure: Right - LenSx - EXTRACTION CATARACT EXTRACAPSULAR WITH PHACO WITH INSERTION INTRAOCULAR PROSTHESIS;   Surgeon: Netta Corrigan, MD;  Location: [facility];  Service: Ophthalmology;  Laterality: Right;  EXTRACTION CATARACT EXTRACAPSULAR W/INSERTION INTRAOCULAR PROSTHESIS Left 04/26/2022   Procedure: Left - LenSx - EXTRACTION CATARACT EXTRACAPSULAR WITH PHACO WITH INSERTION INTRAOCULAR PROSTHESIS;  Surgeon: Netta Corrigan, MD;  Location: [facility];  Service: Ophthalmology;  Laterality: Left;  BREAST BIOPSY   03/15/2021  SPINE SURGERY   2016   cervical replacement / fusion  trapeziectomy      TUBAL LIGATION          Allergies Allergies Allergen Reactions  Azithromycin Abdominal Pain  Rosuvastatin Calcium Other (See Comments)     Indigestion  Sulfa (Sulfonamide Antibiotics) Other (See Comments)      Current Outpatient Medications on File Prior to Visit Medication Sig Dispense Refill  BD ULTRA-FINE SHORT PEN NEEDLE 31 gauge x 5/16" needle as directed      busPIRone (BUSPAR) 15 MG tablet Take 15 mg by mouth 3 (three) times daily      clonazePAM (KLONOPIN) 0.5 MG tablet Take 0.25 milligrams twice daily (Patient not taking: Reported on 06/20/2022) 30 tablet 1  cyclobenzaprine (FLEXERIL) 10 MG tablet Take 10 mg by mouth 3 (three) times daily as needed (Patient not taking: Reported on 06/20/2022)      diclofenac (VOLTAREN) 75 MG EC tablet Take 75 mg by mouth 2 (two) times daily      dicyclomine (BENTYL) 10 mg capsule  FA-B6-B12-coQ10-ALA-acetylcyst (METHAZEL) 1-50-2.12-01-48 mg Cap  (Patient not taking: Reported on 11/15/2021)      gabapentin (NEURONTIN) 600 MG tablet Take 1 tablet (600 mg total) by mouth 3 (three) times daily for 7 days (Patient not taking: Reported on 02/24/2022) 21 tablet 0  gabapentin (NEURONTIN) 600 MG tablet Take 600 mg in the morning, and 1200 mg at night 270 tablet 1  methIMAzole (TAPAZOLE) 5 MG tablet Take 1.5 tablets (7.5 mg total) by mouth once daily (Patient taking differently: Take 7.5 mg by mouth every morning) 135 tablet 3  nortriptyline  (PAMELOR) 10 MG capsule Take 3 capsules (30 mg total) by mouth at bedtime for 360 days (Patient taking differently: Take 50 mg by mouth at bedtime) 270 capsule 3  omeprazole (PRILOSEC OTC) 20 MG EC tablet Take 20 mg by mouth every morning before breakfast      oxyCODONE (ROXICODONE) 5 MG immediate release tablet Take 1 tablet (5 mg total) by mouth every 6 (six) hours as needed for Pain for up to 5 doses (Patient not taking: Reported on 06/20/2022) 5 tablet 0  prednisoLONE acetate (PRED FORTE) 1 % ophthalmic suspension Place 1 drop into both eyes 3 (three) times daily (Patient not taking: Reported on 06/20/2022) 5 mL 3  semaglutide (OZEMPIC) 0.25 mg or 0.5 mg (2 mg/3 mL) pen injector Inject subcutaneously once a week Takes on Sundays      venlafaxine (EFFEXOR-XR) 75 MG XR capsule Take 3 capsules (225 mg total) by mouth once daily Take 75 mg nightly for two weeks then increase to 150 mg at night for two weeks then increase to 225 mg at night and continue that dose (Patient taking differently: Take 150 mg by mouth at bedtime Take 75 mg nightly for two weeks then increase to 150 mg at night for two weeks then increase to 225 mg at night and continue that dose) 270 capsule 1   No current facility-administered medications on file prior to visit.     Family History Family History Problem Relation Age of Onset  Arthritis Mother    Dementia Mother    Heart disease Father    Breast cancer Maternal Grandmother    Breast cancer Paternal Grandmother    Anesthesia problems Neg Hx        Social History   Tobacco Use Smoking Status Former  Packs/day: 1.50  Years: 30.00  Additional pack years: 0.00  Total pack years: 45.00  Types: Cigarettes  Start date: 01/14/1975  Quit date: 04/06/2011  Years since quitting: 11.3 Smokeless Tobacco Never     Social History Social History    Socioeconomic History  Marital status: Married  Number of children: 2  Highest education level: High school  graduate Occupational History  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) Tobacco Use  Smoking status: Former     Packs/day: 1.50     Years: 30.00     Additional pack years: 0.00     Total pack years: 45.00     Types: Cigarettes     Start date: 01/14/1975     Quit date: 04/06/2011     Years since quitting: 11.3  Smokeless tobacco: Never Vaping Use  Vaping Use: Former Substance and Sexual Activity  Alcohol use: Not Currently  Drug use: Not Currently  Sexual activity: Yes     Partners: Male     Birth control/protection: Post-menopausal     Comment: also tubal ligation    Social Determinants of Health  Financial Resource Strain: Low Risk  (08/25/2021)   Overall Financial Resource Strain (CARDIA)    Difficulty of Paying Living Expenses: Not hard at all Food Insecurity: No Food Insecurity (08/25/2021)   Hunger Vital Sign    Worried About Running Out of Food in the Last Year: Never true    Ran Out of Food in the Last Year: Never true Transportation Needs: No Transportation Needs (08/25/2021)   PRAPARE - Hydrologist (Medical): No    Lack of Transportation (Non-Medical): No      Objective:     Vitals:   08/09/22 1346 BP: 121/78 Pulse: 101 Temp: 36.1 C (97 F) SpO2: 96% Weight: 92.5 kg (204 lb) Height: 162.6 cm (5\' 4" )   Body mass index is 35.02 kg/m.   Physical Exam Vitals reviewed.  Constitutional:      General: She is not in acute distress.    Appearance: Normal appearance.  Eyes:     General: No scleral icterus.    Conjunctiva/sclera: Conjunctivae normal.  Cardiovascular:     Rate and Rhythm: Normal rate and regular rhythm.     Heart sounds: No murmur heard. Pulmonary:     Effort: Pulmonary effort is normal. No respiratory distress.     Breath sounds: Normal breath sounds. No wheezing.  Abdominal:     General: There is no distension.     Palpations: Abdomen is soft.     Tenderness: There  is no abdominal tenderness.     Comments: Well-healed periumbilical and lower abdominal scars.  Musculoskeletal:        General: Normal range of motion.     Cervical back: Normal range of motion.  Skin:    General: Skin is warm and dry.     Coloration: Skin is not jaundiced.  Neurological:     General: No focal deficit present.     Mental Status: She is alert and oriented to person, place, and time.  Psychiatric:        Mood and Affect: Mood normal.        Behavior: Behavior normal.            Assessment and Plan: Diagnoses and all orders for this visit:   Calculus of gallbladder without cholecystitis without obstruction       This is a 62 yo female with postprandial epigastric abdominal pain and cholelithiasis. I have personally reviewed her labs, notes and imaging. Her symptoms are consistent with biliary colic. Laparoscopic cholecystectomy was recommended. The details of this procedure were discussed with the patient, including the risks of bleeding, infection, bile leak, and <0.5% risk of common bile duct injury. The patient expressed understanding and agrees to proceed with surgery. She will be contacted to schedule an elective surgery date. Lap chole education booklet was provided.  Michaelle Birks, [facility], Hepatobiliary and Pancreatic Surgery 08/09/22 2:10 PM

## 2022-08-09 NOTE — H&P (View-Only) (Signed)
History of Present Illness: Carol Becker is a 62 y.o. female who was referred to me for evaluation of gallstones. She reports that for about 20 years now, she has been having abdominal pain, which is getting worse. The pain is in the epigastric area, and initially occurred only after eating greasy foods. It now happens more frequently, and she has several episodes a month. During an attack she says the pain is very severe and is associated with nausea. No fevers. She had a CT scan on 02/13/22 for workup of a left adrenal nodule (1.5cm consistent with benign adenoma, stable from prior), and this also showed gallstones. She also reports that she had a HIDA scan at [facility], and had the same symptoms during the HIDA, but was told the study was normal (we do not have the HIDA results).   She reports having a diagnostic laparoscopy many years ago but no other intraabdominal surgeries. 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     Review of Systems: A complete review of systems was obtained from the patient.  I have reviewed this information and discussed as appropriate with the patient.  See HPI as well for other ROS.       Medical History: Past Medical History Past Medical History: Diagnosis Date  Breast disorder September 2022   DCIS  Depression    Diarrhea in adult patient 03/28/2017  DJD (degenerative joint disease) of cervical spine    GERD (gastroesophageal reflux disease)    History of cancer     left breast  Hyperlipidemia, unspecified    Motion sickness    Obesity (BMI 30-39.9), unspecified    OSA on CPAP 2013   Per pt no current sxs. Stopped CPAP 2018.  Prediabetes    Restless legs    Sleep apnea    Thyroid activity decreased    Vertiginous migraine    Vertigo        Patient Active Problem List Diagnosis  Degeneration of cervical intervertebral disc  Hyperlipidemia  Obesity  Thyroid activity decreased  Prediabetes  Diarrhea  in adult patient  Primary osteoarthritis of first carpometacarpal joint of left hand  Pain in joints of left hand  Rheumatoid factor positive  History of depression  Drug-induced tremor  Benign paroxysmal positional vertigo  Phonophobia  Dizziness  Vertiginous migraine  Nausea  Pain in finger of left hand  Anxiety, generalized  Ductal carcinoma in situ (DCIS) of left breast  OSA on CPAP     Past Surgical History Past Surgical History: Procedure Laterality Date  2 cervical disks replaced   10/2014   and fusion  REVISION WRIST ARTHROPLASTY Left 06/16/2020   Procedure: LEFT ARTHROPLASTY, INTERPOSITION, INTERCARPAL OR CARPOMETACARPAL JOINTS;  Surgeon: Aline Brochure, MD;  [facility];  Service: Plastic Surgery;  Laterality: Left;  GRAFT TENDON Left 06/16/2020   Procedure: LEFT TENDON GRAFT, FROM A DISTANCE ( PALMARIS);  Surgeon: Aline Brochure, MD;  [facility];  Service: Plastic Surgery;  Laterality: Left;  CARPECTOMY Left 06/16/2020   Procedure: LEFT CARPECTOMY; 1 BONE;  Surgeon: Aline Brochure, MD;  [facility];  Service: Plastic Surgery;  Laterality: Left;  MASTECTOMY PARTIAL Left 05/05/2021   Procedure: Left MASTECTOMY, PARTIAL [place];  Surgeon: Sheliah Hatch, MD;  [facility];  Service: General Surgery;  Laterality: Left;  MASTECTOMY PARTIAL / LUMPECTOMY Left 05/05/2021  MASTECTOMY PARTIAL Left 05/27/2021   Procedure: Left MASTECTOMY, PARTIAL;  Surgeon: Sheliah Hatch,  MD;  [facility];  Service: General Surgery;  Laterality: Left;  shoulder surgery Left 2023  RECONSTRUCTION BREAST W/FREE FLAP Bilateral 08/17/2021   Procedure: **FREE FLAP** - BREAST RECONSTRUCTION; WITH FREE FLAP (EG, FTRAM, DIEP, SIEA, GAP FLAP);  Surgeon: Allena Katz, MD;  Location: [facility];  Service: Plastic Surgery;  Laterality: Bilateral;  INTRAVENOUS INJECTION TO TEST BLOOD FLOW IN FLAP/GRAFT N/A 08/17/2021    Procedure: INTRAVENOUS INJECTION OF AGENT (EG, FLUORESCEIN) TO TEST VASCULAR FLOW IN FLAP OR GRAFT;  Surgeon: Allena Katz, MD;  Location: [facility];  Service: Plastic Surgery;  Laterality: N/A;  MASTECTOMY BILATERAL SIMPLE Bilateral 08/17/2021   Procedure: BILATERAL MASTECTOMY, SIMPLE, COMPLETE;  Surgeon: Sheliah Hatch, MD;  Location: [facility];  Service: General Surgery;  Laterality: Bilateral;  BIOPSY/EXCISION LYMPH NODE AXILLARY Left 08/17/2021   Procedure: BIOPSY OR EXCISION OF LYMPH NODE(S); OPEN, DEEP AXILLARY NODE(S);  Surgeon: Sheliah Hatch, MD;  [facility]: [facility];  Service: General Surgery;  Laterality: Left;  REVISION OF RECONSTRUCTED BREAST Bilateral 02/16/2022   Procedure: BIL - REVISION OF RECONSTRUCTED BREAST (EG, SIGNIFICANT REMOVAL TISSUE/RE-ADVANCEMENT AND/OR RE-INSET FLAPS AUTOLOGOUS RECON/SIGNIFICANT CAPSULAR REVISION COMBINED W/ SOFT TISSUE EXCISE IMPLANT-BASED RECON);  Surgeon: Allena Katz, MD;  Location: [facility];  Service: Plastic Surgery;  Laterality: Bilateral;  REPAIR WOUND INTERMEDIATE SCALP/AXILLA/TRUNK/EXTREMITY Bilateral 02/16/2022   Procedure: BIL - REPAIR, INTERMEDIATE, WOUND OF SCALP/AXILLA/TRUNK/EXTREMITY (EXCLUDING HAND AND FOOT); 7.6 CM TO 12.5 CM;  Surgeon: Allena Katz, MD;  Location: [facility];  Service: Plastic Surgery;  Laterality: Bilateral;  trunk  EXCISION BENIGN SKIN LESION TRUNK/ARM/LEG Bilateral 02/16/2022   Procedure: BIL - EXCISION, BENIGN LESION INCLUDING MARGINS, EXCEPT SKIN TAG (UNLESS LISTED ELSEWHERE), TRUNK, ARM, OR LEG; EXCISED DIAMETER OVER 4.0 CM;  Surgeon: Allena Katz, MD;  Location: [facility];  Service: Plastic Surgery;  Laterality: Bilateral;  trunk  EXTRACTION CATARACT EXTRACAPSULAR W/INSERTION INTRAOCULAR PROSTHESIS Right 04/12/2022   Procedure: Right - LenSx - EXTRACTION CATARACT EXTRACAPSULAR WITH PHACO WITH INSERTION INTRAOCULAR PROSTHESIS;   Surgeon: Netta Corrigan, MD;  Location: [facility];  Service: Ophthalmology;  Laterality: Right;  EXTRACTION CATARACT EXTRACAPSULAR W/INSERTION INTRAOCULAR PROSTHESIS Left 04/26/2022   Procedure: Left - LenSx - EXTRACTION CATARACT EXTRACAPSULAR WITH PHACO WITH INSERTION INTRAOCULAR PROSTHESIS;  Surgeon: Netta Corrigan, MD;  Location: [facility];  Service: Ophthalmology;  Laterality: Left;  BREAST BIOPSY   03/15/2021  SPINE SURGERY   2016   cervical replacement / fusion  trapeziectomy      TUBAL LIGATION          Allergies Allergies Allergen Reactions  Azithromycin Abdominal Pain  Rosuvastatin Calcium Other (See Comments)     Indigestion  Sulfa (Sulfonamide Antibiotics) Other (See Comments)      Current Outpatient Medications on File Prior to Visit Medication Sig Dispense Refill  BD ULTRA-FINE SHORT PEN NEEDLE 31 gauge x 5/16" needle as directed      busPIRone (BUSPAR) 15 MG tablet Take 15 mg by mouth 3 (three) times daily      clonazePAM (KLONOPIN) 0.5 MG tablet Take 0.25 milligrams twice daily (Patient not taking: Reported on 06/20/2022) 30 tablet 1  cyclobenzaprine (FLEXERIL) 10 MG tablet Take 10 mg by mouth 3 (three) times daily as needed (Patient not taking: Reported on 06/20/2022)      diclofenac (VOLTAREN) 75 MG EC tablet Take 75 mg by mouth 2 (two) times daily      dicyclomine (BENTYL) 10 mg capsule  FA-B6-B12-coQ10-ALA-acetylcyst (METHAZEL) 1-50-2.12-01-48 mg Cap  (Patient not taking: Reported on 11/15/2021)      gabapentin (NEURONTIN) 600 MG tablet Take 1 tablet (600 mg total) by mouth 3 (three) times daily for 7 days (Patient not taking: Reported on 02/24/2022) 21 tablet 0  gabapentin (NEURONTIN) 600 MG tablet Take 600 mg in the morning, and 1200 mg at night 270 tablet 1  methIMAzole (TAPAZOLE) 5 MG tablet Take 1.5 tablets (7.5 mg total) by mouth once daily (Patient taking differently: Take 7.5 mg by mouth every morning) 135 tablet 3  nortriptyline  (PAMELOR) 10 MG capsule Take 3 capsules (30 mg total) by mouth at bedtime for 360 days (Patient taking differently: Take 50 mg by mouth at bedtime) 270 capsule 3  omeprazole (PRILOSEC OTC) 20 MG EC tablet Take 20 mg by mouth every morning before breakfast      oxyCODONE (ROXICODONE) 5 MG immediate release tablet Take 1 tablet (5 mg total) by mouth every 6 (six) hours as needed for Pain for up to 5 doses (Patient not taking: Reported on 06/20/2022) 5 tablet 0  prednisoLONE acetate (PRED FORTE) 1 % ophthalmic suspension Place 1 drop into both eyes 3 (three) times daily (Patient not taking: Reported on 06/20/2022) 5 mL 3  semaglutide (OZEMPIC) 0.25 mg or 0.5 mg (2 mg/3 mL) pen injector Inject subcutaneously once a week Takes on Sundays      venlafaxine (EFFEXOR-XR) 75 MG XR capsule Take 3 capsules (225 mg total) by mouth once daily Take 75 mg nightly for two weeks then increase to 150 mg at night for two weeks then increase to 225 mg at night and continue that dose (Patient taking differently: Take 150 mg by mouth at bedtime Take 75 mg nightly for two weeks then increase to 150 mg at night for two weeks then increase to 225 mg at night and continue that dose) 270 capsule 1   No current facility-administered medications on file prior to visit.     Family History Family History Problem Relation Age of Onset  Arthritis Mother    Dementia Mother    Heart disease Father    Breast cancer Maternal Grandmother    Breast cancer Paternal Grandmother    Anesthesia problems Neg Hx        Social History   Tobacco Use Smoking Status Former  Packs/day: 1.50  Years: 30.00  Additional pack years: 0.00  Total pack years: 45.00  Types: Cigarettes  Start date: 01/14/1975  Quit date: 04/06/2011  Years since quitting: 11.3 Smokeless Tobacco Never     Social History Social History    Socioeconomic History  Marital status: Married  Number of children: 2  Highest education level: High school  graduate Occupational History  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) Tobacco Use  Smoking status: Former     Packs/day: 1.50     Years: 30.00     Additional pack years: 0.00     Total pack years: 45.00     Types: Cigarettes     Start date: 01/14/1975     Quit date: 04/06/2011     Years since quitting: 11.3  Smokeless tobacco: Never Vaping Use  Vaping Use: Former Substance and Sexual Activity  Alcohol use: Not Currently  Drug use: Not Currently  Sexual activity: Yes     Partners: Male     Birth control/protection: Post-menopausal     Comment: also tubal ligation    Social Determinants of Health  Financial Resource Strain: Low Risk  (08/25/2021)   Overall Financial Resource Strain (CARDIA)    Difficulty of Paying Living Expenses: Not hard at all Food Insecurity: No Food Insecurity (08/25/2021)   Hunger Vital Sign    Worried About Running Out of Food in the Last Year: Never true    Ran Out of Food in the Last Year: Never true Transportation Needs: No Transportation Needs (08/25/2021)   PRAPARE - Hydrologist (Medical): No    Lack of Transportation (Non-Medical): No      Objective:     Vitals:   08/09/22 1346 BP: 121/78 Pulse: 101 Temp: 36.1 C (97 F) SpO2: 96% Weight: 92.5 kg (204 lb) Height: 162.6 cm (5\' 4" )   Body mass index is 35.02 kg/m.   Physical Exam Vitals reviewed.  Constitutional:      General: She is not in acute distress.    Appearance: Normal appearance.  Eyes:     General: No scleral icterus.    Conjunctiva/sclera: Conjunctivae normal.  Cardiovascular:     Rate and Rhythm: Normal rate and regular rhythm.     Heart sounds: No murmur heard. Pulmonary:     Effort: Pulmonary effort is normal. No respiratory distress.     Breath sounds: Normal breath sounds. No wheezing.  Abdominal:     General: There is no distension.     Palpations: Abdomen is soft.     Tenderness: There  is no abdominal tenderness.     Comments: Well-healed periumbilical and lower abdominal scars.  Musculoskeletal:        General: Normal range of motion.     Cervical back: Normal range of motion.  Skin:    General: Skin is warm and dry.     Coloration: Skin is not jaundiced.  Neurological:     General: No focal deficit present.     Mental Status: She is alert and oriented to person, place, and time.  Psychiatric:        Mood and Affect: Mood normal.        Behavior: Behavior normal.            Assessment and Plan: Diagnoses and all orders for this visit:   Calculus of gallbladder without cholecystitis without obstruction       This is a 62 yo female with postprandial epigastric abdominal pain and cholelithiasis. I have personally reviewed her labs, notes and imaging. Her symptoms are consistent with biliary colic. Laparoscopic cholecystectomy was recommended. The details of this procedure were discussed with the patient, including the risks of bleeding, infection, bile leak, and <0.5% risk of common bile duct injury. The patient expressed understanding and agrees to proceed with surgery. She will be contacted to schedule an elective surgery date. Lap chole education booklet was provided.  Michaelle Birks, [facility], Hepatobiliary and Pancreatic Surgery 08/09/22 2:10 PM

## 2022-08-09 NOTE — Progress Notes (Signed)
Name: Carol Becker  MRN/ DOB: [id number], [birth year]    Age/ Sex: 62 y.o., female    PCP: Valerie Roys, DO   Reason for Endocrinology Evaluation: Hyperthyroidism     Date of Initial Endocrinology Evaluation: 08/09/2022     HPI: [name] is a 62 y.o. 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 The patient presented for initial endocrinology clinic visit on 08/09/2022 for consultative assistance with her Hyperthyroidism.   Patient has been diagnosed with hypothyroidism > 15 yrs ago , she was started on LT-for replacement 25 mcg daily and stayed on it for years.  She discontinued LT-for replacement approximately 2021 due to side effects.  By November, 2021 the patient has been noted with hyperthyroidism and was started on thionamides therapy.    ADRENAL HISTORY:  During evaluation for breast cancer in 03/2021 she was noted with adrenal gland adenoma on CT imaging.  CT imaging 08/2021 showed a 1.5 cm indeterminant left adrenal nodule.   Of note, the patient follows with neurology and has chronic vertigo/vestibular migraines  Patient transitioned care from [facility] endocrinology   Today she is accompanied by her spouse today  Weight fluctuates  Denies local neck swelling  Denies eye symptoms  Denies loose stools or diarrhea  Denies palpitations Denies LE edema  No hx of hypokalemia  NO biotin   No XRT   Grand daughter with thyroid disease   Methimazole 5 mg, 1.5 tabs daily       HISTORY:  Past Medical History:  Past Medical History:  Diagnosis Date   Adrenal nodule (HCC)    Anxiety    Arthritis    knees, left thumb, shoulders   Breast cancer ([facility])    Left   Cervical spinal stenosis    Family history of breast cancer    GERD (gastroesophageal reflux disease)    History of hypothyroidism    Hyperlipidemia    poss history of    Hyperthyroidism    Osteopenia    PONV (postoperative nausea and vomiting)    after wisdom teeth   Pre-diabetes    Restless leg syndrome    Sleep apnea    Tremors of nervous system    Vertigo    Vestibular migraine    Past Surgical History:  Past Surgical History:  Procedure Laterality Date   ANTERIOR / POSTERIOR COMBINED FUSION CERVICAL SPINE     BREAST BIOPSY Left 03/15/2021   Stereo bx, Ribbon Clip, path pending   BREAST LUMPECTOMY Left    COLONOSCOPY WITH PROPOFOL N/A 11/05/2020   Procedure: COLONOSCOPY WITH PROPOFOL;  Surgeon: Lucilla Lame, MD;  Location: [facility];  Service: Endoscopy;  Laterality: N/A;   CYSTECTOMY Left    KNEE ARTHROSCOPY WITH EXCISION BAKER'S CYST Left    LAPAROSCOPY     Exploratory   POLYPECTOMY  11/05/2020   Procedure: POLYPECTOMY;  Surgeon: Lucilla Lame, MD;  Location: [facility];  Service: Endoscopy;;   SHOULDER ARTHROSCOPY WITH ROTATOR CUFF REPAIR Left 12/01/2021   Procedure: Left shoulder arthroscopy, debridement, subacromial decompression, distal clavicle resection, chondroplasty;  Surgeon: Justice Britain, MD;  Location: [facility];  Service: Orthopedics;  Laterality: Left;  28min   TOTAL MASTECTOMY Bilateral    TOTAL SHOULDER ARTHROPLASTY Left 03/16/2022   Procedure: TOTAL SHOULDER ARTHROPLASTY;  Surgeon: Justice Britain, MD;  Location: [facility];  Service: Orthopedics;  Laterality:  Left;   trapeziectomy Left    done at [facility] 06/16/20   TUBAL LIGATION     WISDOM TOOTH EXTRACTION      Social History:  reports that she quit smoking about 11 years ago. Her smoking use included cigarettes. She has never used smokeless tobacco. She reports that she does not currently use alcohol. She reports that she does not use drugs. Family History: family history includes Alzheimer's disease in her maternal grandfather; Bipolar disorder in her daughter; Breast cancer in her cousin; Breast cancer (age of onset: 22) in her paternal grandmother; Breast cancer  (age of onset: 67) in her maternal grandmother; Cancer in her maternal grandmother and paternal grandmother; Heart disease in her cousin and father; Heart disease (age of onset: 98) in her sister; Parkinson's disease in her sister.   HOME MEDICATIONS: Allergies as of 08/09/2022       Reactions   Crestor [rosuvastatin Calcium] Other (See Comments)   Indigestion   Sulfa Antibiotics Nausea Only   Zithromax [azithromycin] Other (See Comments)   Not effective        Medication List        Accurate as of August 09, 2022  9:30 AM. If you have any questions, ask your nurse or doctor.          diclofenac 75 MG EC tablet Commonly known as: VOLTAREN Take 1 tablet (75 mg total) by mouth 2 (two) times daily.   dicyclomine 10 MG capsule Commonly known as: BENTYL TAKE 1 CAPSULE(10 MG) BY MOUTH FOUR TIMES DAILY BEFORE MEALS AND AT BEDTIME   gabapentin 600 MG tablet Commonly known as: NEURONTIN Take 1 pill in the AM and 2 pills in the PM What changed:  how much to take how to take this when to take this additional instructions   methimazole 5 MG tablet Commonly known as: TAPAZOLE Take 7.5 mg by mouth daily. Taking 1 & 1/2 tablets daily   nortriptyline 50 MG capsule Commonly known as: PAMELOR Take 1 capsule (50 mg total) by mouth at bedtime.   omeprazole 20 MG capsule Commonly known as: PRILOSEC Take 20 mg by mouth daily.   Semaglutide (1 MG/DOSE) 4 MG/3ML Sopn Inject 1 mg as directed once a week.   venlafaxine XR 150 MG 24 hr capsule Commonly known as: EFFEXOR-XR Take 1 capsule (150 mg total) by mouth at bedtime.          REVIEW OF SYSTEMS: A comprehensive ROS was conducted with the patient and is negative except as per HPI     OBJECTIVE:  VS: BP 118/80 (BP Location: Left Arm, Patient Position: Sitting, Cuff Size: Normal)   Pulse 91   Ht 5\' 4"  (1.626 m)   Wt 204 lb 3.2 oz (92.6 kg)   SpO2 99%   BMI 35.05 kg/m    Wt Readings from Last 3 Encounters:   08/09/22 204 lb 3.2 oz (92.6 kg)  06/15/22 205 lb 3.2 oz (93.1 kg)  03/16/22 197 lb (89.4 kg)     EXAM: General: Pt appears well and is in NAD  Eyes: External eye exam normal without stare, lid lag or exophthalmos.  EOM intact.  PERRL.  Neck: General: Supple without adenopathy. Thyroid: Thyroid size normal.  No goiter or nodules appreciated.  Lungs: Clear with good BS bilat with no rales, rhonchi, or wheezes  Heart: Auscultation: RRR.  Abdomen: Normoactive bowel sounds, soft, nontender, without masses or organomegaly palpable  Extremities:  BL LE: No pretibial edema normal ROM and  strength.  Mental Status: Judgment, insight: Intact Orientation: Oriented to time, place, and person Mood and affect: No depression, anxiety, or agitation     DATA REVIEWED:  Latest Reference Range & Units 08/09/22 10:03  Sodium 135 - 145 mEq/L 137  Potassium 3.5 - 5.1 mEq/L 4.3  Chloride 96 - 112 mEq/L 102  CO2 19 - 32 mEq/L 29  Glucose 70 - 99 mg/dL 88  BUN 6 - 23 mg/dL 10  Creatinine 0.40 - 1.20 mg/dL 0.92  Calcium 8.4 - 10.5 mg/dL 9.6  Alkaline Phosphatase 39 - 117 U/L 100  Albumin 3.5 - 5.2 g/dL 4.5  AST 0 - 37 U/L 23  ALT 0 - 35 U/L 22  Total Protein 6.0 - 8.3 g/dL 7.7  Total Bilirubin 0.2 - 1.2 mg/dL 0.3  GFR >60.00 mL/min 67.70      Latest Reference Range & Units 08/09/22 10:03  TSH 0.35 - 5.50 uIU/mL 0.91  T4,Free(Direct) 0.60 - 1.60 ng/dL 0.74   Old records , labs and images have been reviewed.   ASSESSMENT/PLAN/RECOMMENDATIONS:   Hyperthyroidism:  - Pt is clinically euthyroid  -No local neck swelling  - Discussed D/D of Graves' disease vs toxic thyroid nodule  -Will obtain TRAb, will consider thyroid ultrasound in the future if needed -TFTs normal  Medications : Continue methimazole 5 mg 1.5 tabs daily   2. Left adrenal Adenoma :  - Eighty five percent of adrenal adenomas are nonsecretory.  - Three forms of adrenal hyperfunction should be considered in patients  with adrenal incidentaloma  Glucocorticoid hypersecretion Primary hyperaldosteronism Pheochromocytoma  -We will proceed with Aldo, renin, 24-hour urinary collection for catecholamines and cortisol -I did offer the patient a drop of locally in [place] but she prefers to come back here -She may bring this in 2 months when she comes for repeat TFTs -Adrenal imaging shows stability in 2023   F/U in 4 months  Labs in 2 months   Signed electronically by: Mack Guise, MD  [facility]  [facility] [address] [place], [place] [address] Phone: [phone] FAX: [phone]   CC: Valerie Roys DO [place] [address] Phone: [phone] Fax: [phone]   Return to Endocrinology clinic as below: Future Appointments  Date Time Provider [facility]  08/31/2022  3:00 PM Valerie Roys, DO [facility]  02/15/2023  2:45 PM Frann Rider, NP [facility] None

## 2022-08-15 LAB — ALDOSTERONE + RENIN ACTIVITY W/ RATIO
Aldos/Renin Ratio: 7.1 (ref 0.0–30.0)
Aldosterone: 13.5 ng/dL (ref 0.0–30.0)
Renin Activity, Plasma: 1.899 ng/mL/hr (ref 0.167–5.380)

## 2022-08-15 LAB — THYROTROPIN RECEPTOR AUTOABS: Thyrotropin Receptor Ab: 4.6 IU/L — ABNORMAL HIGH (ref 0.00–1.75)

## 2022-08-28 ENCOUNTER — Encounter: Payer: Managed Care, Other (non HMO) | Admitting: Family Medicine

## 2022-08-31 ENCOUNTER — Encounter (HOSPITAL_COMMUNITY): Payer: Self-pay | Admitting: Surgery

## 2022-08-31 ENCOUNTER — Other Ambulatory Visit: Payer: Self-pay

## 2022-08-31 ENCOUNTER — Encounter: Payer: Self-pay | Admitting: Family Medicine

## 2022-08-31 ENCOUNTER — Ambulatory Visit (INDEPENDENT_AMBULATORY_CARE_PROVIDER_SITE_OTHER): Payer: Managed Care, Other (non HMO) | Admitting: Family Medicine

## 2022-08-31 ENCOUNTER — Other Ambulatory Visit (HOSPITAL_COMMUNITY)
Admission: RE | Admit: 2022-08-31 | Discharge: 2022-08-31 | Disposition: A | Discharge status: Home / Self Care | Payer: Managed Care, Other (non HMO) | Source: Ambulatory Visit | Attending: Family Medicine | Admitting: Family Medicine

## 2022-08-31 VITALS — BP 126/80 | HR 84 | Temp 98.9°F | Ht 64.25 in | Wt 203.8 lb

## 2022-08-31 DIAGNOSIS — Z23 Encounter for immunization: Secondary | ICD-10-CM

## 2022-08-31 DIAGNOSIS — E0590 Thyrotoxicosis, unspecified without thyrotoxic crisis or storm: Secondary | ICD-10-CM

## 2022-08-31 DIAGNOSIS — E1149 Type 2 diabetes mellitus with other diabetic neurological complication: Secondary | ICD-10-CM

## 2022-08-31 DIAGNOSIS — E782 Mixed hyperlipidemia: Secondary | ICD-10-CM

## 2022-08-31 DIAGNOSIS — Z0000 Encounter for general adult medical examination without abnormal findings: Secondary | ICD-10-CM | POA: Diagnosis not present

## 2022-08-31 DIAGNOSIS — Z720 Tobacco use: Secondary | ICD-10-CM | POA: Diagnosis not present

## 2022-08-31 LAB — URINALYSIS, ROUTINE W REFLEX MICROSCOPIC
Bilirubin, UA: NEGATIVE
Glucose, UA: NEGATIVE
Ketones, UA: NEGATIVE
Nitrite, UA: NEGATIVE
Protein,UA: NEGATIVE
Specific Gravity, UA: <1.005 — ABNORMAL LOW (ref 1.005–1.030)
Urobilinogen, Ur: 0.2 mg/dL (ref 0.2–1.0)
pH, UA: 7 (ref 5.0–7.5)

## 2022-08-31 LAB — MICROSCOPIC EXAMINATION: Bacteria, UA: NONE SEEN

## 2022-08-31 LAB — BAYER DCA HB A1C WAIVED: HB A1C (BAYER DCA - WAIVED): 6 % — ABNORMAL HIGH (ref 4.8–5.6)

## 2022-08-31 LAB — MICROALBUMIN, URINE WAIVED
Creatinine, Urine Waived: 10 mg/dL (ref 10–300)
Microalb, Ur Waived: 30 mg/L — ABNORMAL HIGH (ref 0–19)
Microalb/Creat Ratio: >300 mg/g — ABNORMAL HIGH (ref <30)

## 2022-08-31 MED ORDER — TIRZEPATIDE 7.5 MG/0.5ML ~~LOC~~ SOAJ: 7.5000 mg | SUBCUTANEOUS | 0 refills | Status: DC

## 2022-08-31 MED ORDER — DICLOFENAC SODIUM 75 MG PO TBEC: 75.0000 mg | DELAYED_RELEASE_TABLET | Freq: Two times a day (BID) | ORAL | 1 refills | Status: DC

## 2022-08-31 MED ORDER — GABAPENTIN 800 MG PO TABS: 800.0000 mg | ORAL_TABLET | Freq: Two times a day (BID) | ORAL | 1 refills | Status: DC

## 2022-08-31 MED ORDER — MELOXICAM 15 MG PO TABS: 15.0000 mg | ORAL_TABLET | Freq: Every day | ORAL | 1 refills | Status: DC

## 2022-08-31 NOTE — Assessment & Plan Note (Signed)
Having a lot of constipation with her ozempic and sugars back up to 6.0 from 5.5- will change to mounjaro 7.5 and recheck 3 months. Call with any concerns.

## 2022-08-31 NOTE — Progress Notes (Signed)
BP 126/80   Pulse 84   Temp 98.9 F (37.2 C) (Oral)   Ht 5' 4.25" (1.632 m)   Wt 203 lb 12.8 oz (92.4 kg)   SpO2 97%   BMI 34.71 kg/m    Subjective:    Patient ID: Carol Becker, female    DOB: 05-11-1961, 62 y.o.   MRN: [id number]  HPI: Carol Becker is a 62 y.o. female presenting on 08/31/2022 for comprehensive medical examination. Current medical complaints include:  DIABETES Hypoglycemic episodes:no Polydipsia/polyuria: no Visual disturbance: no Chest pain: no Paresthesias: no Glucose Monitoring: no  Accucheck frequency: Not Checking Taking Insulin?: no Blood Pressure Monitoring: not checking Retinal Examination: Up to Date Foot Exam: Up to Date Diabetic Education: Completed Pneumovax: Up to Date Influenza: Not up to Date Aspirin: no  HYPERTENSION / [place] Satisfied with current treatment? yes Duration of hypertension: chronic BP monitoring frequency: not checking BP medication side effects: no Past BP meds: none Duration of hyperlipidemia: chronic Cholesterol medication side effects: no Cholesterol supplements: none Past cholesterol medications: none Medication compliance: N/A Aspirin: no Recent stressors: no Recurrent headaches: no Visual changes: no Palpitations: no Dyspnea: no Chest pain: no Lower extremity edema: no Dizzy/lightheaded: no  DEPRESSION Mood status: controlled Satisfied with current treatment?: yes Symptom severity: mild  Duration of current treatment : chronic Side effects: no Medication compliance: excellent compliance Psychotherapy/counseling: no  Previous psychiatric medications: effexor Depressed mood: no Anxious mood: no Anhedonia: no Significant weight loss or gain: no Insomnia: no  Fatigue: yes Feelings of worthlessness or guilt: no Impaired concentration/indecisiveness: no Suicidal ideations: no Hopelessness: no Crying spells: no    08/31/2022    3:07 PM 02/24/2022    2:45 PM 11/18/2021   10:08  AM 05/20/2021    8:15 AM 02/11/2021    3:01 PM  Depression screen PHQ 2/9  Decreased Interest 0 0 0 0 1  Down, Depressed, Hopeless 0 0 0 0 1  PHQ - 2 Score 0 0 0 0 2  Altered sleeping 1 0 0 0 3  Tired, decreased energy 0 '1 1 2 1  '$ Change in appetite 0 0 0 0 0  Feeling bad or failure about yourself  0 0 0 0 0  Trouble concentrating 0 0 0 0 0  Moving slowly or fidgety/restless 0 0 0 0 0  Suicidal thoughts 0 0 0 0 0  PHQ-9 Score '1 1 1 2 6  '$ Difficult doing work/chores Not difficult at all Not difficult at all Not difficult at all  Not difficult at all   She currently lives with: husband Menopausal Symptoms: no  Depression Screen done today and results listed below:     08/31/2022    3:07 PM 02/24/2022    2:45 PM 11/18/2021   10:08 AM 05/20/2021    8:15 AM 02/11/2021    3:01 PM  Depression screen PHQ 2/9  Decreased Interest 0 0 0 0 1  Down, Depressed, Hopeless 0 0 0 0 1  PHQ - 2 Score 0 0 0 0 2  Altered sleeping 1 0 0 0 3  Tired, decreased energy 0 '1 1 2 1  '$ Change in appetite 0 0 0 0 0  Feeling bad or failure about yourself  0 0 0 0 0  Trouble concentrating 0 0 0 0 0  Moving slowly or fidgety/restless 0 0 0 0 0  Suicidal thoughts 0 0 0 0 0  PHQ-9 Score '1 1 1 2 6  '$ Difficult doing work/chores Not  difficult at all Not difficult at all Not difficult at all  Not difficult at all    Past Medical History:  Past Medical History:  Diagnosis Date   Adrenal nodule (HCC)    Anxiety    Arthritis    knees, left thumb, shoulders   Breast cancer ([place])    Left   Cervical spinal stenosis    Family history of breast cancer    GERD (gastroesophageal reflux disease)    History of hypothyroidism    Hyperlipidemia    poss history of   Hyperthyroidism    Hypothyroidism    Osteopenia    PONV (postoperative nausea and vomiting)    after wisdom teeth   Pre-diabetes    Restless leg syndrome    Sleep apnea    tested ~2012-" lost a lot of weight, no longer has sleep apnea."- per patient    Tremors of nervous system    medication related   Vertigo    Vestibular migraine     Surgical History:  Past Surgical History:  Procedure Laterality Date   ANTERIOR / POSTERIOR COMBINED FUSION CERVICAL SPINE     BREAST BIOPSY Left 03/15/2021   Stereo bx, Ribbon Clip, path pending   BREAST LUMPECTOMY Left    COLONOSCOPY WITH PROPOFOL N/A 11/05/2020   Procedure: COLONOSCOPY WITH PROPOFOL;  Surgeon: Lucilla Lame, MD;  Location: [facility];  Service: Endoscopy;  Laterality: N/A;   CYSTECTOMY Left    KNEE ARTHROSCOPY WITH EXCISION BAKER'S CYST Left    LAPAROSCOPY     Exploratory   POLYPECTOMY  11/05/2020   Procedure: POLYPECTOMY;  Surgeon: Lucilla Lame, MD;  Location: [facility];  Service: Endoscopy;;   SHOULDER ARTHROSCOPY WITH ROTATOR CUFF REPAIR Left 12/01/2021   Procedure: Left shoulder arthroscopy, debridement, subacromial decompression, distal clavicle resection, chondroplasty;  Surgeon: Justice Britain, MD;  Location: [facility];  Service: Orthopedics;  Laterality: Left;  43mn   TOTAL MASTECTOMY Bilateral    TOTAL SHOULDER ARTHROPLASTY Left 03/16/2022   Procedure: TOTAL SHOULDER ARTHROPLASTY;  Surgeon: SJustice Britain MD;  Location: [facility];  Service: Orthopedics;  Laterality: Left;   [facility] Left    [facility]   TUBAL LIGATION     WISDOM TOOTH EXTRACTION      Medications:  Current Facility-Administered Medications on File Prior to Visit  Medication   acetaminophen (TYLENOL) tablet 1,000 mg   ceFAZolin (ANCEF) IVPB 2g/100 mL premix   chlorhexidine (PERIDEX) 0.12 % solution 15 mL   Or   Oral care mouth rinse   chlorhexidine (PERIDEX) 0.12 % solution   lactated ringers infusion   Current Outpatient Medications on File Prior to Visit  Medication Sig   Cyanocobalamin (VITAMIN B-12 PO) Take 2 tablets by mouth daily. Gummy   methimazole (TAPAZOLE) 5 MG tablet Take 1.5 tablets (7.5 mg total) by mouth daily. Taking 1 & 1/2 tablets daily   Multiple  Vitamin (MULTIVITAMIN) capsule Take 2 capsules by mouth daily. Gummies   nortriptyline (PAMELOR) 50 MG capsule Take 1 capsule (50 mg total) by mouth at bedtime.   omeprazole (PRILOSEC) 20 MG capsule Take 20 mg by mouth daily.   venlafaxine XR (EFFEXOR-XR) 150 MG 24 hr capsule Take 1 capsule (150 mg total) by mouth at bedtime.    Allergies:  Allergies  Allergen Reactions   Crestor [Rosuvastatin Calcium] Other (See Comments)    Indigestion   Ozempic (0.25 Or 0.5 Mg-Dose) [Semaglutide(0.25 Or 0.'5mg'$ -Dos)] Other (See Comments)    constipation   Sulfa  Antibiotics Nausea Only   Zithromax [Azithromycin] Other (See Comments)    Not effective    Social History:  Social History   Socioeconomic History   Marital status: Married    Spouse name: Not on file   Number of children: Not on file   Years of education: Not on file   Highest education level: Not on file  Occupational History   Not on file  Tobacco Use   Smoking status: Former    Years: 30.00    Types: Cigarettes    Quit date: 04/06/2011    Years since quitting: 11.4   Smokeless tobacco: Never  Vaping Use   Vaping Use: Former   Quit date: 04/05/2013  Substance and Sexual Activity   Alcohol use: Not Currently    Comment: On Occasion   Drug use: No   Sexual activity: Yes    Birth control/protection: Post-menopausal  Other Topics Concern   Not on file  Social History Narrative   Lives in [place] with husband; quit smoking- 30 years; no alcohol abuse. 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]   Social Determinants of Radio broadcast assistant Strain: Not on [employer] Insecurity: Not on file  Transportation Needs: Not on file  Physical Activity: Not on file  Stress: Not on file  Social Connections: Not on file  Intimate Partner Violence: Not on file   Social History   Tobacco Use  Smoking Status Former   Years: 30.00   Types: Cigarettes   Quit date: 04/06/2011   Years since  quitting: 11.4  Smokeless Tobacco Never   Social History   Substance and Sexual Activity  Alcohol Use Not Currently   Comment: On Occasion    Family History:  Family History  Problem Relation Age of Onset   Heart disease Father        CAD   Heart disease Sister 73       CAD   Parkinson's disease Sister    Cancer Maternal Grandmother        breast   Breast cancer Maternal Grandmother 19   Alzheimer's disease Maternal Grandfather    Cancer Paternal Grandmother        breast   Breast cancer Paternal Grandmother 90   Bipolar disorder Daughter    Heart disease Cousin    Breast cancer Cousin        dx 30s-40s, d. 17s-40s    Past medical history, surgical history, medications, allergies, family history and social history reviewed with patient today and changes made to appropriate areas of the chart.   Review of Systems  Constitutional: Negative.   HENT: Negative.    Eyes:  Positive for blurred vision. Negative for double vision, photophobia, pain, discharge and redness.       White film on her eye since cataract surgery  Respiratory: Negative.    Cardiovascular:  Positive for palpitations. Negative for chest pain, orthopnea, claudication, leg swelling and PND.  Gastrointestinal:  Positive for constipation. Negative for abdominal pain, blood in stool, diarrhea, heartburn, melena, nausea and vomiting.  Genitourinary: Negative.   Musculoskeletal: Negative.   Skin: Negative.   Neurological: Negative.   Endo/Heme/Allergies: Negative.   Psychiatric/Behavioral: Negative.     All other ROS negative except what is listed above and in the HPI.      Objective:    BP 126/80   Pulse 84   Temp 98.9 F (37.2 C) (Oral)   Ht 5' 4.25" (1.632 m)  Wt 203 lb 12.8 oz (92.4 kg)   SpO2 97%   BMI 34.71 kg/m   Wt Readings from Last 3 Encounters:  09/01/22 200 lb (90.7 kg)  08/31/22 203 lb 12.8 oz (92.4 kg)  08/09/22 204 lb 3.2 oz (92.6 kg)    Physical Exam Vitals and nursing note  reviewed. Exam conducted with a chaperone present.  Constitutional:      General: She is not in acute distress.    Appearance: Normal appearance. She is not ill-appearing, toxic-appearing or diaphoretic.  HENT:     Head: Normocephalic and atraumatic.     Right Ear: Tympanic membrane, ear canal and external ear normal. There is no impacted cerumen.     Left Ear: Tympanic membrane, ear canal and external ear normal. There is no impacted cerumen.     Nose: Nose normal. No congestion or rhinorrhea.     Mouth/Throat:     Mouth: Mucous membranes are moist.     Pharynx: Oropharynx is clear. No oropharyngeal exudate or posterior oropharyngeal erythema.  Eyes:     General: No scleral icterus.       Right eye: No discharge.        Left eye: No discharge.     Extraocular Movements: Extraocular movements intact.     Conjunctiva/sclera: Conjunctivae normal.     Pupils: Pupils are equal, round, and reactive to light.  Neck:     Vascular: No carotid bruit.  Cardiovascular:     Rate and Rhythm: Normal rate and regular rhythm.     Pulses: Normal pulses.     Heart sounds: No murmur heard.    No friction rub. No gallop.  Pulmonary:     Effort: Pulmonary effort is normal. No respiratory distress.     Breath sounds: Normal breath sounds. No stridor. No wheezing, rhonchi or rales.  Chest:     Chest wall: No tenderness.  Abdominal:     General: Abdomen is flat. Bowel sounds are normal. There is no distension.     Palpations: Abdomen is soft. There is no mass.     Tenderness: There is no abdominal tenderness. There is no right CVA tenderness, left CVA tenderness, guarding or rebound.     Hernia: No hernia is present. There is no hernia in the left inguinal area or right inguinal area.  Genitourinary:    Labia:        Right: No rash, tenderness, lesion or injury.        Left: No rash, tenderness, lesion or injury.      Vagina: Normal.     Cervix: Normal.     Uterus: Normal.      Adnexa: Right  adnexa normal and left adnexa normal.     Comments: Breast exams deferred with shared decision making Musculoskeletal:        General: No swelling, tenderness, deformity or signs of injury.     Cervical back: Normal range of motion and neck supple. No rigidity. No muscular tenderness.     Right lower leg: No edema.     Left lower leg: No edema.  Lymphadenopathy:     Cervical: No cervical adenopathy.  Skin:    General: Skin is warm and dry.     Capillary Refill: Capillary refill takes less than 2 seconds.     Coloration: Skin is not jaundiced or pale.     Findings: No bruising, erythema, lesion or rash.  Neurological:     General: No focal deficit present.  Mental Status: She is alert and oriented to person, place, and time. Mental status is at baseline.     Cranial Nerves: No cranial nerve deficit.     Sensory: No sensory deficit.     Motor: No weakness.     Coordination: Coordination normal.     Gait: Gait normal.     Deep Tendon Reflexes: Reflexes normal.  Psychiatric:        Mood and Affect: Mood normal.        Behavior: Behavior normal.        Thought Content: Thought content normal.        Judgment: Judgment normal.     Results for orders placed or performed in visit on 08/31/22  Microscopic Examination   Urine  Result Value Ref Range   WBC, UA 6-10 (A) 0 - 5 /hpf   RBC, Urine 0-2 0 - 2 /hpf   Epithelial Cells (non renal) 0-10 0 - 10 /hpf   Bacteria, UA None seen None seen/Few  Urinalysis, Routine w reflex microscopic  Result Value Ref Range   Specific Gravity, UA <1.005 (L) 1.005 - 1.030   pH, UA 7.0 5.0 - 7.5   Color, UA Yellow Yellow   Appearance Ur Clear Clear   Leukocytes,UA 1+ (A) Negative   Protein,UA Negative Negative/Trace   Glucose, UA Negative Negative   Ketones, UA Negative Negative   RBC, UA Trace (A) Negative   Bilirubin, UA Negative Negative   Urobilinogen, Ur 0.2 0.2 - 1.0 mg/dL   Nitrite, UA Negative Negative   Microscopic Examination See  below:   Bayer DCA Hb A1c Waived  Result Value Ref Range   HB A1C (BAYER DCA - WAIVED) 6.0 (H) 4.8 - 5.6 %  Microalbumin, Urine Waived  Result Value Ref Range   Microalb, Ur Waived 30 (H) 0 - 19 mg/L   Creatinine, Urine Waived 10 10 - 300 mg/dL   Microalb/Creat Ratio >300 (H) <30 mg/g      Assessment & Plan:   Problem List Items Addressed This Visit       Endocrine   Hyperthyroidism    Continue to follow with endocrinology. Call with any concerns. Continue to monitor.       Relevant Orders   CBC with Differential/Platelet   Comprehensive metabolic panel   TSH   Type 2 diabetes mellitus with neurological complications ([place])    Having a lot of constipation with her ozempic and sugars back up to 6.0 from 5.5- will change to mounjaro 7.5 and recheck 3 months. Call with any concerns.       Relevant Medications   tirzepatide (MOUNJARO) 7.5 MG/0.5ML Pen   Other Relevant Orders   CBC with Differential/Platelet   Comprehensive metabolic panel   Lipid Panel w/o Chol/HDL Ratio   Urinalysis, Routine w reflex microscopic (Completed)   Bayer DCA Hb A1c Waived (Completed)   Microalbumin, Urine Waived (Completed)     Other   Hyperlipidemia    Declines statin. Rechecking labs today. Await results. Treat as needed.       Relevant Orders   CBC with Differential/Platelet   Comprehensive metabolic panel   Lipid Panel w/o Chol/HDL Ratio   Other Visit Diagnoses     Routine general medical examination at a health care facility    -  Primary   Vaccines up to date. Screening labs checked today. Pap done. Mammo N/A. Colonoscopy up to date. Continue diet and exercise. Call with any concerns.   Relevant Orders  CBC with Differential/Platelet   Comprehensive metabolic panel   Lipid Panel w/o Chol/HDL Ratio   Urinalysis, Routine w reflex microscopic (Completed)   TSH   Bayer DCA Hb A1c Waived (Completed)   Microalbumin, Urine Waived (Completed)   Cytology - PAP   Tobacco abuse        Due for low dose CT scan- ordered today.   Relevant Orders   CT CHEST LUNG CA SCREEN LOW DOSE W/O CM        Follow up plan: Return in about 3 months (around 11/29/2022).   LABORATORY TESTING:  - Pap smear: pap done  IMMUNIZATIONS:   - Tdap: Tetanus vaccination status reviewed: last tetanus booster within 10 years. - Influenza: Refused - Pneumovax: Up to date - Prevnar: Not applicable - COVID: Up to date - HPV: Not applicable - Shingrix vaccine: Administered today  SCREENING: -Mammogram: N/A  - Colonoscopy: Up to date    PATIENT COUNSELING:   Advised to take 1 mg of folate supplement per day if capable of pregnancy.   Sexuality: Discussed sexually transmitted diseases, partner selection, use of condoms, avoidance of unintended pregnancy  and contraceptive alternatives.   Advised to avoid cigarette smoking.  I discussed with the patient that most people either abstain from alcohol or drink within safe limits (<=14/week and <=4 drinks/occasion for males, <=7/weeks and <= 3 drinks/occasion for females) and that the risk for alcohol disorders and other health effects rises proportionally with the number of drinks per week and how often a drinker exceeds daily limits.  Discussed cessation/primary prevention of drug use and availability of treatment for abuse.   Diet: Encouraged to adjust caloric intake to maintain  or achieve ideal body weight, to reduce intake of dietary saturated fat and total fat, to limit sodium intake by avoiding high sodium foods and not adding table salt, and to maintain adequate dietary potassium and calcium preferably from fresh fruits, vegetables, and low-fat dairy products.    stressed the importance of regular exercise  Injury prevention: Discussed safety belts, safety helmets, smoke detector, smoking near bedding or upholstery.   Dental health: Discussed importance of regular tooth brushing, flossing, and dental visits.    NEXT PREVENTATIVE  PHYSICAL DUE IN 1 YEAR. Return in about 3 months (around 11/29/2022).

## 2022-08-31 NOTE — Progress Notes (Signed)
Carol Becker denies chest pain or shortness of breath. Patient denies having any s/s of Covid in her household, also denies any known exposure to Covid.   [name] PCP is Dr.Megan Wynetta Emery, patient has an appointment with Dr. Wynetta Emery today, she will bring a copy of BMET with her in am.  [name] has pre- diabetes.  Patient rarely checks CBGs, she checked CBG a couple days ago, it was 200. I instructed patient to check CBG after awaking and every 2 hours until arrival  to the hospital.  I Instructed patient if CBG is less than 70 to take 4 Glucose Tablets or 1 tube of Glucose Gel or 1/2 cup of a clear juice. Recheck CBG in 15 minutes if CBG is not over 70 call, pre- op desk at [phone] for further instructions.  [name] takes Ozempic for weight loss, patient stoped Ozempic 08/20/22, she is switching to another injection after surgery,

## 2022-09-01 ENCOUNTER — Encounter (HOSPITAL_COMMUNITY): Payer: Self-pay | Admitting: Surgery

## 2022-09-01 ENCOUNTER — Other Ambulatory Visit: Payer: Self-pay

## 2022-09-01 ENCOUNTER — Ambulatory Visit (HOSPITAL_BASED_OUTPATIENT_CLINIC_OR_DEPARTMENT_OTHER): Payer: Managed Care, Other (non HMO) | Admitting: Certified Registered Nurse Anesthetist

## 2022-09-01 ENCOUNTER — Ambulatory Visit (HOSPITAL_COMMUNITY)
Admission: RE | Admit: 2022-09-01 | Discharge: 2022-09-01 | Disposition: A | Discharge status: Home / Self Care | Payer: Managed Care, Other (non HMO) | Attending: Surgery | Admitting: Surgery

## 2022-09-01 ENCOUNTER — Encounter (HOSPITAL_COMMUNITY)
Admission: RE | Disposition: A | Discharge status: Home / Self Care | Payer: Self-pay | Source: Home / Self Care | Attending: Surgery

## 2022-09-01 ENCOUNTER — Ambulatory Visit (HOSPITAL_COMMUNITY): Payer: Managed Care, Other (non HMO) | Admitting: Certified Registered Nurse Anesthetist

## 2022-09-01 DIAGNOSIS — G4733 Obstructive sleep apnea (adult) (pediatric): Secondary | ICD-10-CM | POA: Insufficient documentation

## 2022-09-01 DIAGNOSIS — K219 Gastro-esophageal reflux disease without esophagitis: Secondary | ICD-10-CM | POA: Diagnosis not present

## 2022-09-01 DIAGNOSIS — Z86000 Personal history of in-situ neoplasm of breast: Secondary | ICD-10-CM | POA: Insufficient documentation

## 2022-09-01 DIAGNOSIS — Z7985 Long-term (current) use of injectable non-insulin antidiabetic drugs: Secondary | ICD-10-CM | POA: Insufficient documentation

## 2022-09-01 DIAGNOSIS — Z87891 Personal history of nicotine dependence: Secondary | ICD-10-CM | POA: Diagnosis not present

## 2022-09-01 DIAGNOSIS — K8020 Calculus of gallbladder without cholecystitis without obstruction: Secondary | ICD-10-CM

## 2022-09-01 DIAGNOSIS — F419 Anxiety disorder, unspecified: Secondary | ICD-10-CM | POA: Insufficient documentation

## 2022-09-01 DIAGNOSIS — E119 Type 2 diabetes mellitus without complications: Secondary | ICD-10-CM | POA: Diagnosis not present

## 2022-09-01 DIAGNOSIS — K8010 Calculus of gallbladder with chronic cholecystitis without obstruction: Secondary | ICD-10-CM | POA: Diagnosis not present

## 2022-09-01 HISTORY — DX: Hypothyroidism, unspecified: E03.9

## 2022-09-01 HISTORY — PX: CHOLECYSTECTOMY: SHX55

## 2022-09-01 LAB — CBC
HCT: 37.8 % (ref 36.0–46.0)
Hemoglobin: 12.2 g/dL (ref 12.0–15.0)
MCH: 28.2 pg (ref 26.0–34.0)
MCHC: 32.3 g/dL (ref 30.0–36.0)
MCV: 87.5 fL (ref 80.0–100.0)
Platelets: 309 10*3/uL (ref 150–400)
RBC: 4.32 MIL/uL (ref 3.87–5.11)
RDW: 14.1 % (ref 11.5–15.5)
WBC: 8.2 10*3/uL (ref 4.0–10.5)
nRBC: 0 % (ref 0.0–0.2)

## 2022-09-01 SURGERY — LAPAROSCOPIC CHOLECYSTECTOMY
Anesthesia: General

## 2022-09-01 MED ORDER — PHENYLEPHRINE 80 MCG/ML (10ML) SYRINGE FOR IV PUSH (FOR BLOOD PRESSURE SUPPORT)
PREFILLED_SYRINGE | INTRAVENOUS | Status: DC | PRN
  Administered 2022-09-01 (×2): 80 ug via INTRAVENOUS

## 2022-09-01 MED ORDER — LIDOCAINE 2% (20 MG/ML) 5 ML SYRINGE
INTRAMUSCULAR | Status: AC
  Filled 2022-09-01: qty 5

## 2022-09-01 MED ORDER — FENTANYL CITRATE (PF) 250 MCG/5ML IJ SOLN
INTRAMUSCULAR | Status: DC | PRN
  Administered 2022-09-01 (×3): 50 ug via INTRAVENOUS

## 2022-09-01 MED ORDER — ORAL CARE MOUTH RINSE: 15.0000 mL | Freq: Once | OROMUCOSAL | Status: AC

## 2022-09-01 MED ORDER — DEXAMETHASONE SODIUM PHOSPHATE 10 MG/ML IJ SOLN
INTRAMUSCULAR | Status: DC | PRN
  Administered 2022-09-01: 5 mg via INTRAVENOUS

## 2022-09-01 MED ORDER — PROPOFOL 10 MG/ML IV BOLUS
INTRAVENOUS | Status: AC
  Filled 2022-09-01: qty 20

## 2022-09-01 MED ORDER — PROPOFOL 500 MG/50ML IV EMUL
INTRAVENOUS | Status: DC | PRN
  Administered 2022-09-01: 30 ug/kg/min via INTRAVENOUS

## 2022-09-01 MED ORDER — LACTATED RINGERS IV SOLN: INTRAVENOUS | Status: DC

## 2022-09-01 MED ORDER — ROCURONIUM BROMIDE 10 MG/ML (PF) SYRINGE
PREFILLED_SYRINGE | INTRAVENOUS | Status: AC
  Filled 2022-09-01: qty 10

## 2022-09-01 MED ORDER — FENTANYL CITRATE (PF) 100 MCG/2ML IJ SOLN
INTRAMUSCULAR | Status: AC
  Filled 2022-09-01: qty 2

## 2022-09-01 MED ORDER — BUPIVACAINE HCL (PF) 0.25 % IJ SOLN
INTRAMUSCULAR | Status: AC
  Filled 2022-09-01: qty 30

## 2022-09-01 MED ORDER — FENTANYL CITRATE (PF) 250 MCG/5ML IJ SOLN
INTRAMUSCULAR | Status: AC
  Filled 2022-09-01: qty 5

## 2022-09-01 MED ORDER — CEFAZOLIN SODIUM-DEXTROSE 2-4 GM/100ML-% IV SOLN
2.0000 g | INTRAVENOUS | Status: AC
  Administered 2022-09-01: 2 g via INTRAVENOUS
  Filled 2022-09-01: qty 100

## 2022-09-01 MED ORDER — ONDANSETRON HCL 4 MG/2ML IJ SOLN
INTRAMUSCULAR | Status: AC
  Filled 2022-09-01: qty 2

## 2022-09-01 MED ORDER — OXYCODONE HCL 5 MG PO TABS
5.0000 mg | ORAL_TABLET | Freq: Once | ORAL | Status: AC | PRN
  Administered 2022-09-01: 5 mg via ORAL

## 2022-09-01 MED ORDER — BUPIVACAINE-EPINEPHRINE 0.25% -1:200000 IJ SOLN
INTRAMUSCULAR | Status: DC | PRN
  Administered 2022-09-01: 20 mL

## 2022-09-01 MED ORDER — SODIUM CHLORIDE 0.9 % IR SOLN
Status: DC | PRN
  Administered 2022-09-01: 1000 mL

## 2022-09-01 MED ORDER — SUGAMMADEX SODIUM 200 MG/2ML IV SOLN
INTRAVENOUS | Status: DC | PRN
  Administered 2022-09-01: 60 mg via INTRAVENOUS
  Administered 2022-09-01 (×3): 100 mg via INTRAVENOUS

## 2022-09-01 MED ORDER — FENTANYL CITRATE (PF) 100 MCG/2ML IJ SOLN
25.0000 ug | INTRAMUSCULAR | Status: DC | PRN
  Administered 2022-09-01 (×3): 50 ug via INTRAVENOUS

## 2022-09-01 MED ORDER — MIDAZOLAM HCL 2 MG/2ML IJ SOLN
INTRAMUSCULAR | Status: DC | PRN
  Administered 2022-09-01: 1 mg via INTRAVENOUS

## 2022-09-01 MED ORDER — ONDANSETRON HCL 4 MG/2ML IJ SOLN
INTRAMUSCULAR | Status: DC | PRN
  Administered 2022-09-01: 4 mg via INTRAVENOUS

## 2022-09-01 MED ORDER — CHLORHEXIDINE GLUCONATE 0.12 % MT SOLN
OROMUCOSAL | Status: AC
  Administered 2022-09-01: 15 mL via OROMUCOSAL
  Filled 2022-09-01: qty 15

## 2022-09-01 MED ORDER — 0.9 % SODIUM CHLORIDE (POUR BTL) OPTIME
TOPICAL | Status: DC | PRN
  Administered 2022-09-01: 1000 mL

## 2022-09-01 MED ORDER — ROCURONIUM BROMIDE 10 MG/ML (PF) SYRINGE
PREFILLED_SYRINGE | INTRAVENOUS | Status: DC | PRN
  Administered 2022-09-01: 60 mg via INTRAVENOUS

## 2022-09-01 MED ORDER — CHLORHEXIDINE GLUCONATE 0.12 % MT SOLN: 15.0000 mL | Freq: Once | OROMUCOSAL | Status: AC

## 2022-09-01 MED ORDER — LIDOCAINE 2% (20 MG/ML) 5 ML SYRINGE
INTRAMUSCULAR | Status: DC | PRN
  Administered 2022-09-01: 100 mg via INTRAVENOUS

## 2022-09-01 MED ORDER — PROPOFOL 10 MG/ML IV BOLUS
INTRAVENOUS | Status: DC | PRN
  Administered 2022-09-01: 180 mg via INTRAVENOUS

## 2022-09-01 MED ORDER — PROPOFOL 1000 MG/100ML IV EMUL
INTRAVENOUS | Status: AC
  Filled 2022-09-01: qty 100

## 2022-09-01 MED ORDER — ACETAMINOPHEN 500 MG PO TABS
1000.0000 mg | ORAL_TABLET | ORAL | Status: AC
  Administered 2022-09-01: 1000 mg via ORAL
  Filled 2022-09-01: qty 2

## 2022-09-01 MED ORDER — DEXAMETHASONE SODIUM PHOSPHATE 10 MG/ML IJ SOLN
INTRAMUSCULAR | Status: AC
  Filled 2022-09-01: qty 1

## 2022-09-01 MED ORDER — EPHEDRINE SULFATE-NACL 50-0.9 MG/10ML-% IV SOSY
PREFILLED_SYRINGE | INTRAVENOUS | Status: DC | PRN
  Administered 2022-09-01 (×2): 5 mg via INTRAVENOUS

## 2022-09-01 MED ORDER — OXYCODONE HCL 5 MG/5ML PO SOLN: 5.0000 mg | Freq: Once | ORAL | Status: AC | PRN

## 2022-09-01 MED ORDER — AMISULPRIDE (ANTIEMETIC) 5 MG/2ML IV SOLN
10.0000 mg | Freq: Once | INTRAVENOUS | Status: AC | PRN
  Administered 2022-09-01: 10 mg via INTRAVENOUS

## 2022-09-01 MED ORDER — AMISULPRIDE (ANTIEMETIC) 5 MG/2ML IV SOLN
INTRAVENOUS | Status: AC
  Filled 2022-09-01: qty 4

## 2022-09-01 MED ORDER — MIDAZOLAM HCL 2 MG/2ML IJ SOLN
INTRAMUSCULAR | Status: AC
  Filled 2022-09-01: qty 2

## 2022-09-01 MED ORDER — HYDROCODONE-ACETAMINOPHEN 5-325 MG PO TABS: 1.0000 | ORAL_TABLET | Freq: Four times a day (QID) | ORAL | 0 refills | Status: DC | PRN

## 2022-09-01 MED ORDER — OXYCODONE HCL 5 MG PO TABS
ORAL_TABLET | ORAL | Status: AC
  Filled 2022-09-01: qty 1

## 2022-09-01 MED ORDER — ACETAMINOPHEN 500 MG PO TABS: 1000.0000 mg | ORAL_TABLET | Freq: Once | ORAL | Status: DC

## 2022-09-01 SURGICAL SUPPLY — 48 items
ADH SKN CLS APL DERMABOND .7 (GAUZE/BANDAGES/DRESSINGS) ×1
ADH SKN CLS LQ APL DERMABOND (GAUZE/BANDAGES/DRESSINGS) ×1
APL PRP STRL LF DISP 70% ISPRP (MISCELLANEOUS) ×1
APPLIER CLIP 5 13 M/L LIGAMAX5 (MISCELLANEOUS) ×1
APR CLP MED LRG 5 ANG JAW (MISCELLANEOUS) ×1
BAG COUNTER SPONGE SURGICOUNT (BAG) ×2 IMPLANT
BAG SPEC RTRVL 10 TROC 200 (ENDOMECHANICALS)
BAG SPNG CNTER NS LX DISP (BAG) ×1
BLADE CLIPPER SURG (BLADE) IMPLANT
CANISTER SUCT 3000ML PPV (MISCELLANEOUS) ×2 IMPLANT
CHLORAPREP W/TINT 26 (MISCELLANEOUS) ×2 IMPLANT
CLIP APPLIE 5 13 M/L LIGAMAX5 (MISCELLANEOUS) ×2 IMPLANT
COVER SURGICAL LIGHT HANDLE (MISCELLANEOUS) ×2 IMPLANT
DERMABOND ADVANCED .7 DNX12 (GAUZE/BANDAGES/DRESSINGS) ×2 IMPLANT
DERMABOND ADVANCED .7 DNX6 (GAUZE/BANDAGES/DRESSINGS) IMPLANT
ELECT REM PT RETURN 9FT ADLT (ELECTROSURGICAL) ×1
ELECTRODE REM PT RTRN 9FT ADLT (ELECTROSURGICAL) ×2 IMPLANT
GLOVE BIOGEL PI IND STRL 6 (GLOVE) ×2 IMPLANT
GLOVE BIOGEL PI MICRO STRL 5.5 (GLOVE) ×2 IMPLANT
GOWN STRL REUS W/ TWL LRG LVL3 (GOWN DISPOSABLE) ×6 IMPLANT
GOWN STRL REUS W/TWL LRG LVL3 (GOWN DISPOSABLE) ×3
IRRIG SUCT STRYKERFLOW 2 WTIP (MISCELLANEOUS) ×1
IRRIGATION SUCT STRKRFLW 2 WTP (MISCELLANEOUS) ×2 IMPLANT
KIT BASIN OR (CUSTOM PROCEDURE TRAY) ×2 IMPLANT
KIT TURNOVER KIT B (KITS) ×2 IMPLANT
L-HOOK LAP DISP 36CM (ELECTROSURGICAL) ×1
LHOOK LAP DISP 36CM (ELECTROSURGICAL) ×2 IMPLANT
NDL INSUFFLATION 14GA 120MM (NEEDLE) IMPLANT
NEEDLE INSUFFLATION 14GA 120MM (NEEDLE) IMPLANT
NS IRRIG 1000ML POUR BTL (IV SOLUTION) ×2 IMPLANT
PAD ARMBOARD 7.5X6 YLW CONV (MISCELLANEOUS) ×2 IMPLANT
PENCIL BUTTON HOLSTER BLD 10FT (ELECTRODE) ×2 IMPLANT
POUCH RETRIEVAL ECOSAC 10 (ENDOMECHANICALS) IMPLANT
POUCH RETRIEVAL ECOSAC 10MM (ENDOMECHANICALS)
SCISSORS LAP 5X35 DISP (ENDOMECHANICALS) ×2 IMPLANT
SET TUBE SMOKE EVAC HIGH FLOW (TUBING) ×2 IMPLANT
SLEEVE Z-THREAD 5X100MM (TROCAR) ×4 IMPLANT
SUT MNCRL AB 4-0 PS2 18 (SUTURE) ×2 IMPLANT
SYS BAG RETRIEVAL 10MM (BASKET) ×1
SYSTEM BAG RETRIEVAL 10MM (BASKET) IMPLANT
TOWEL GREEN STERILE (TOWEL DISPOSABLE) ×2 IMPLANT
TOWEL GREEN STERILE FF (TOWEL DISPOSABLE) ×2 IMPLANT
TRAY LAPAROSCOPIC MC (CUSTOM PROCEDURE TRAY) ×2 IMPLANT
TROCAR BALLN 12MMX100 BLUNT (TROCAR) ×2 IMPLANT
TROCAR Z THREAD OPTICAL 12X100 (TROCAR) IMPLANT
TROCAR Z-THREAD OPTICAL 5X100M (TROCAR) ×2 IMPLANT
WARMER LAPAROSCOPE (MISCELLANEOUS) ×2 IMPLANT
WATER STERILE IRR 1000ML POUR (IV SOLUTION) ×2 IMPLANT

## 2022-09-01 NOTE — Anesthesia Postprocedure Evaluation (Signed)
Anesthesia Post Note  Patient: Carol Becker  Procedure(s) Performed: LAPAROSCOPIC CHOLECYSTECTOMY     Patient location during evaluation: PACU Anesthesia Type: General Level of consciousness: awake and alert Pain management: pain level controlled Vital Signs Assessment: post-procedure vital signs reviewed and stable Respiratory status: spontaneous breathing, nonlabored ventilation and respiratory function stable Cardiovascular status: blood pressure returned to baseline Postop Assessment: no apparent nausea or vomiting Anesthetic complications: no   No notable events documented.  Last Vitals:  Vitals:   09/01/22 1209 09/01/22 1215  BP:  (!) 130/59  Pulse: 93 93  Resp: 20 16  Temp:    SpO2: 94% 95%    Last Pain:  Vitals:   09/01/22 1215  TempSrc:   PainSc: [address]

## 2022-09-01 NOTE — Interval H&P Note (Signed)
History and Physical Interval Note:  09/01/2022 9:55 AM  Carol Becker  has presented today for surgery, with the diagnosis of GALLSTONES.  The various methods of treatment have been discussed with the patient and family. After consideration of risks, benefits and other options for treatment, the patient has consented to  Procedure(s): LAPAROSCOPIC CHOLECYSTECTOMY (N/A) as a surgical intervention.  The patient's history has been reviewed, patient examined, no change in status, stable for surgery.  I have reviewed the patient's chart and labs.  Questions were answered to the patient's satisfaction.     Dwan Bolt

## 2022-09-01 NOTE — Transfer of Care (Signed)
Immediate Anesthesia Transfer of Care Note  Patient: Carol Becker  Procedure(s) Performed: LAPAROSCOPIC CHOLECYSTECTOMY  Patient Location: PACU  Anesthesia Type:General  Level of Consciousness: awake, alert , and patient cooperative  Airway & Oxygen Therapy: Patient Spontanous Breathing and Patient connected to face mask oxygen  Post-op Assessment: Report given to RN and Post -op Vital signs reviewed and stable  Post vital signs: Reviewed and stable  Last Vitals:  Vitals Value Taken Time  BP 132/85 09/01/22 1145  Temp    Pulse 89 09/01/22 1147  Resp 16 09/01/22 1147  SpO2 95 % 09/01/22 1147  Vitals shown include unvalidated device data.  Last Pain:  Vitals:   09/01/22 0912  TempSrc:   PainSc: 0-No pain         Complications: No notable events documented.

## 2022-09-01 NOTE — Assessment & Plan Note (Signed)
Declines statin. Rechecking labs today. Await results. Treat as needed.

## 2022-09-01 NOTE — Discharge Instructions (Addendum)
[facility] DISCHARGE INSTRUCTIONS  Activity No heavy lifting greater than 15 pounds for 4 weeks after surgery. Ok to shower in 24 hours, but do not bathe or submerge incisions underwater. Do not drive while taking narcotic pain medication.  Wound Care Your incisions are covered with skin glue called Dermabond. This will peel off on its own over time. You may shower and allow warm soapy water to run over your incisions. Gently pat dry. Do not submerge your incision underwater. Monitor your incision for any new redness, tenderness, or drainage.  When to Call us: Fever greater than 100.5 New redness, drainage, or swelling at incision site Severe pain, nausea, or vomiting Jaundice (yellowing of the whites of the eyes or skin)  Follow-up You have an appointment scheduled with Dr. Zenia Resides on September 19, 2022 at 10:50am. This will be at the [facility] office at [address], [place], [place], [place]. Please arrive at least 15 minutes prior to your scheduled appointment time.  For questions or concerns, please call the office at [phone].

## 2022-09-01 NOTE — Assessment & Plan Note (Signed)
Continue to follow with endocrinology. Call with any concerns. Continue to monitor.  ?

## 2022-09-01 NOTE — Progress Notes (Signed)
67mg Fentanyl wasted in stericycle with JDarral DashRN.  HClyde Canterbury RN

## 2022-09-01 NOTE — Anesthesia Procedure Notes (Signed)
Procedure Name: Intubation Date/Time: 09/01/2022 10:44 AM  Performed by: Janene Harvey, CRNAPre-anesthesia Checklist: Patient identified, Emergency Drugs available, Suction available and Patient being monitored Patient Re-evaluated:Patient Re-evaluated prior to induction Oxygen Delivery Method: Circle system utilized Preoxygenation: Pre-oxygenation with 100% oxygen Induction Type: IV induction Ventilation: Mask ventilation without difficulty and Oral airway inserted - appropriate to patient size Laryngoscope Size: Glidescope and 3 Grade View: Grade I Tube type: Oral Tube size: 7.0 mm Number of attempts: 1 Airway Equipment and Method: Stylet and Oral airway Placement Confirmation: ETT inserted through vocal cords under direct vision, positive ETCO2 and breath sounds checked- equal and bilateral Secured at: 22 cm Tube secured with: Tape Dental Injury: Teeth and Oropharynx as per pre-operative assessment

## 2022-09-01 NOTE — Op Note (Signed)
Date: 09/01/22  Patient: Carol Becker MRN: [id number]  Preoperative Diagnosis: Symptomatic cholelithiasis Postoperative Diagnosis: Same  Procedure: Laparoscopic cholecystectomy  Surgeon: Michaelle Birks, MD  EBL: Minimal  Anesthesia: General endotracheal  Specimens: Gallbladder  Indications: [name] is a 62 yo female who was referred with frequent postprandial epigastric abdominal pain. Imaging workup showed cholelithiasis. After a discussion of the risks and benefits of surgery, she agreed to proceed with cholecystectomy.  Findings: Cholelithiasis without signs of acute cholecystitis.  Procedure details: Informed consent was obtained in the preoperative area prior to the procedure. The patient was brought to the operating room and placed on the table in the supine position. General anesthesia was induced and appropriate lines and drains were placed for intraoperative monitoring. Perioperative antibiotics were administered per SCIP guidelines. The abdomen was prepped and draped in the usual sterile fashion. A pre-procedure timeout was taken verifying patient identity, surgical site and procedure to be performed.  A small infraumbilical skin incision was made, the subcutaneous tissue was divided with cautery, and the umbilical stalk was grasped and elevated. A Veress needle was inserted through the fascia, intraperitoneal placement was confirmed with the saline drop test, and the abdomen was insufflated. A 73m port was placed and the peritoneal cavity was inspected with no evidence of visceral or vascular injury. Three 574mports were placed in the right subcostal margin, all under direct visualization. The fundus of the gallbladder was grasped and retracted cephalad. The infundibulum was retracted laterally. The cystic triangle was dissected out using cautery and blunt dissection, and the critical view of safety was obtained. There were small stones within the cystic duct that were  milked back up into the gallbladder. The cystic artery was clipped and divided with cautery. It appeared there was possible aberrant biliary anatomy with the cystic duct originating from the right hepatic duct. Thus the cystic duct was clipped and divided very close to the gallbladder. The gallbladder was taken off the liver using cautery, and the specimen was placed in an endocatch bag. The surgical site was irrigated with saline until the effluent was clear. Hemostasis was achieved in the gallbladder fossa using cautery. The cystic duct and artery stumps were visually inspected and there was no evidence of bile leak or bleeding. The ports were removed under direct visualization and the abdomen was desufflated. The specimen was extracted via the umbilical port site. The umbilical port site fascia was closed with a 0 vicryl suture. The skin at all port sites was closed with 4-0 monocryl subcuticular suture. Dermabond was applied.  The patient tolerated the procedure well with no apparent complications. All counts were correct x2 at the end of the procedure. The patient was extubated and taken to PACU in stable condition.  ShMichaelle BirksMD 09/01/22 11:26 AM

## 2022-09-01 NOTE — Anesthesia Preprocedure Evaluation (Addendum)
Anesthesia Evaluation  Patient identified by MRN, date of birth, ID band Patient awake    Reviewed: Allergy & Precautions, NPO status , Patient's Chart, lab work & pertinent test results  History of Anesthesia Complications (+) PONV and history of anesthetic complications  Airway Mallampati: II  TM Distance: >3 FB Neck ROM: Full    Dental  (+) Missing,    Pulmonary sleep apnea , former smoker   Pulmonary exam normal        Cardiovascular negative cardio ROS Normal cardiovascular exam     Neuro/Psych  Headaches  Anxiety        GI/Hepatic Neg liver ROS,GERD  ,,gallstones   Endo/Other  diabetes, Type 2Hypothyroidism    Renal/GU negative Renal ROS  negative genitourinary   Musculoskeletal  (+) Arthritis ,    Abdominal   Peds  Hematology negative hematology ROS (+)   Anesthesia Other Findings Day of surgery medications reviewed with patient.  Reproductive/Obstetrics negative OB ROS                             Anesthesia Physical Anesthesia Plan  ASA: 2  Anesthesia Plan: General   Post-op Pain Management: Tylenol PO (pre-op)*   Induction: Intravenous  PONV Risk Score and Plan: 4 or greater and Treatment may vary due to age or medical condition, Ondansetron, Dexamethasone, Midazolam and Propofol infusion  Airway Management Planned: Oral ETT  Additional Equipment: None  Intra-op Plan:   Post-operative Plan: Extubation in OR  Informed Consent: I have reviewed the patients History and Physical, chart, labs and discussed the procedure including the risks, benefits and alternatives for the proposed anesthesia with the patient or authorized representative who has indicated his/her understanding and acceptance.     Dental advisory given  Plan Discussed with: CRNA  Anesthesia Plan Comments:        Anesthesia Quick Evaluation

## 2022-09-02 LAB — LIPID PANEL W/O CHOL/HDL RATIO
Cholesterol, Total: 200 mg/dL — ABNORMAL HIGH (ref 100–199)
HDL: 51 mg/dL (ref >39)
LDL Chol Calc (NIH): 124 mg/dL — ABNORMAL HIGH (ref 0–99)
Triglycerides: 139 mg/dL (ref 0–149)
VLDL Cholesterol Cal: 25 mg/dL (ref 5–40)

## 2022-09-02 LAB — CBC WITH DIFFERENTIAL/PLATELET
Basophils Absolute: 0.1 10*3/uL (ref 0.0–0.2)
Basos: 1 %
EOS (ABSOLUTE): 0.1 10*3/uL (ref 0.0–0.4)
Eos: 1 %
Hematocrit: 38.2 % (ref 34.0–46.6)
Hemoglobin: 12.4 g/dL (ref 11.1–15.9)
Immature Grans (Abs): 0 10*3/uL (ref 0.0–0.1)
Immature Granulocytes: 0 %
Lymphocytes Absolute: 2.3 10*3/uL (ref 0.7–3.1)
Lymphs: 32 %
MCH: 28.7 pg (ref 26.6–33.0)
MCHC: 32.5 g/dL (ref 31.5–35.7)
MCV: 88 fL (ref 79–97)
Monocytes Absolute: 0.8 10*3/uL (ref 0.1–0.9)
Monocytes: 11 %
Neutrophils Absolute: 4.1 10*3/uL (ref 1.4–7.0)
Neutrophils: 55 %
Platelets: 320 10*3/uL (ref 150–450)
RBC: 4.32 x10E6/uL (ref 3.77–5.28)
RDW: 14.5 % (ref 11.7–15.4)
WBC: 7.4 10*3/uL (ref 3.4–10.8)

## 2022-09-02 LAB — COMPREHENSIVE METABOLIC PANEL
ALT: 20 IU/L (ref 0–32)
AST: 23 IU/L (ref 0–40)
Albumin/Globulin Ratio: 1.9 (ref 1.2–2.2)
Albumin: 4.5 g/dL (ref 3.8–4.9)
Alkaline Phosphatase: 118 IU/L (ref 44–121)
BUN/Creatinine Ratio: 12 (ref 12–28)
BUN: 11 mg/dL (ref 8–27)
Bilirubin Total: 0.2 mg/dL (ref 0.0–1.2)
CO2: 23 mmol/L (ref 20–29)
Calcium: 9.1 mg/dL (ref 8.7–10.3)
Chloride: 102 mmol/L (ref 96–106)
Creatinine, Ser: 0.93 mg/dL (ref 0.57–1.00)
Globulin, Total: 2.4 g/dL (ref 1.5–4.5)
Glucose: 60 mg/dL — ABNORMAL LOW (ref 70–99)
Potassium: 4.1 mmol/L (ref 3.5–5.2)
Sodium: 140 mmol/L (ref 134–144)
Total Protein: 6.9 g/dL (ref 6.0–8.5)
eGFR: 70 mL/min/{1.73_m2} (ref >59)

## 2022-09-02 LAB — TSH: TSH: 0.704 u[IU]/mL (ref 0.450–4.500)

## 2022-09-03 ENCOUNTER — Encounter: Payer: Self-pay | Admitting: Family Medicine

## 2022-09-04 LAB — SURGICAL PATHOLOGY

## 2022-09-11 LAB — CYTOLOGY - PAP
Comment: NEGATIVE
Diagnosis: NEGATIVE
High risk HPV: NEGATIVE

## 2022-09-27 ENCOUNTER — Encounter: Payer: Self-pay | Admitting: Family Medicine

## 2022-09-27 ENCOUNTER — Other Ambulatory Visit: Payer: Self-pay | Admitting: Family Medicine

## 2022-09-27 DIAGNOSIS — Z87891 Personal history of nicotine dependence: Secondary | ICD-10-CM

## 2022-09-27 NOTE — Telephone Encounter (Signed)
Copied from [place] [id number]. Topic: General - Other >> Sep 27, 2022  1:29 PM Oley Balm A wrote: Reason for CRM: Pt is calling to speak with PCP nurse. Per pt she went from [place] to [place] and the pharmacy is out of the medication and do not know when they will get any in. Please call pt back to discuss other options.

## 2022-10-20 ENCOUNTER — Telehealth: Payer: Self-pay

## 2022-10-20 NOTE — Telephone Encounter (Signed)
Copied from CRM [id number]. Topic: Referral - Question >> Oct 20, 2022  2:35 PM Haroldine Laws wrote: Reason for CRM: Tanya with [facility] precert.  She is saying the patient has a CT scheduled for Tuesday April 16 and there has not been a precert done yet.  Ordering PHY is Dr. Laural Benes  CB@  [phone] x [phone]

## 2022-10-23 NOTE — Telephone Encounter (Signed)
This patient has not been seen by [facility]. We didn't schedule the CT for the patient

## 2022-10-23 NOTE — Telephone Encounter (Signed)
This is something the referral team does, but I'm pretty sure I referred her to the lung cancer screening through [place], so they would have ordered and pre-certed this

## 2022-10-23 NOTE — Telephone Encounter (Signed)
We will be glad to start pt in the lung cancer screening program. However, we dont have an opening for the shared decision visit before her CT appt on 11/03/22. If you would like for [place] to follow her you would need to cancel the CT and order for 11/03/22 and let pt know we will be contacting her to schedule shared decision visit and lung screening CT through our program. We would then obtain the prior authorization that is needed. Please let us know if we can be of any help.

## 2022-10-23 NOTE — Telephone Encounter (Signed)
She has not had previous CT lung- needs shared decision making visit. Can we cancel appt tomorrow and get her scheduled with [place] for shared decision making visit.

## 2022-10-23 NOTE — Telephone Encounter (Signed)
Sounds great. Let's get her scheduled- please cancel any orders right now. Thanks.

## 2022-10-24 ENCOUNTER — Ambulatory Visit: Admission: RE | Admit: 2022-10-24 | Payer: Managed Care, Other (non HMO) | Source: Ambulatory Visit

## 2022-10-26 ENCOUNTER — Other Ambulatory Visit: Payer: Self-pay | Admitting: *Deleted

## 2022-10-26 DIAGNOSIS — Z122 Encounter for screening for malignant neoplasm of respiratory organs: Secondary | ICD-10-CM

## 2022-10-26 DIAGNOSIS — Z87891 Personal history of nicotine dependence: Secondary | ICD-10-CM

## 2022-11-03 ENCOUNTER — Ambulatory Visit: Payer: Managed Care, Other (non HMO)

## 2022-11-20 ENCOUNTER — Ambulatory Visit: Payer: Managed Care, Other (non HMO) | Admitting: Internal Medicine

## 2022-11-23 ENCOUNTER — Other Ambulatory Visit: Payer: Self-pay

## 2022-11-23 MED ORDER — VENLAFAXINE HCL ER 150 MG PO CP24: 150.0000 mg | ORAL_CAPSULE | Freq: Every day | ORAL | 1 refills | Status: DC

## 2022-11-23 NOTE — Patient Instructions (Signed)
Thank you for participating in the [facility] Lung Cancer Screening Program. It was our pleasure to meet you today. We will call you with the results of your scan within the next few days. Your scan will be assigned a Lung RADS category score by the physicians reading the scans.  This Lung RADS score determines follow up scanning.  See below for description of categories, and follow up screening recommendations. We will be in touch to schedule your follow up screening annually or based on recommendations of our providers. We will fax a copy of your scan results to your Primary Care Physician, or the physician who referred you to the program, to ensure they have the results. Please call the office if you have any questions or concerns regarding your scanning experience or results.  Our office number is [phone]. Please speak with Denise Phelps, RN. , or  Denise Buckner RN, They are  our Lung Cancer Screening RN.'s If They are unavailable when you call, Please leave a message on the voice mail. We will return your call at our earliest convenience.This voice mail is monitored several times a day.  Remember, if your scan is normal, we will scan you annually as long as you continue to meet the criteria for the program. (Age 50-80, Current smoker or smoker who has quit within the last 15 years). If you are a smoker, remember, quitting is the single most powerful action that you can take to decrease your risk of lung cancer and other pulmonary, breathing related problems. We know quitting is hard, and we are here to help.  Please let us know if there is anything we can do to help you meet your goal of quitting. If you are a former smoker, congratulations. We are proud of you! Remain smoke free! Remember you can refer friends or family members through the number above.  We will screen them to make sure they meet criteria for the program. Thank you for helping us take better care of you by  participating in Lung Screening.  You can receive free nicotine replacement therapy ( patches, gum or mints) by calling 1-800-QUIT NOW. Please call so we can get you on the path to becoming  a non-smoker. I know it is hard, but you can do this!  Lung RADS Categories:  Lung RADS 1: no nodules or definitely non-concerning nodules.  Recommendation is for a repeat annual scan in 12 months.  Lung RADS 2:  nodules that are non-concerning in appearance and behavior with a very low likelihood of becoming an active cancer. Recommendation is for a repeat annual scan in 12 months.  Lung RADS 3: nodules that are probably non-concerning , includes nodules with a low likelihood of becoming an active cancer.  Recommendation is for a 6-month repeat screening scan. Often noted after an upper respiratory illness. We will be in touch to make sure you have no questions, and to schedule your 6-month scan.  Lung RADS 4 A: nodules with concerning findings, recommendation is most often for a follow up scan in 3 months or additional testing based on our provider's assessment of the scan. We will be in touch to make sure you have no questions and to schedule the recommended 3 month follow up scan.  Lung RADS 4 B:  indicates findings that are concerning. We will be in touch with you to schedule additional diagnostic testing based on our provider's  assessment of the scan.  Other options for assistance in smoking cessation (   As covered by your insurance benefits)  Hypnosis for smoking cessation  [employer] [phone]  Acupuncture for smoking cessation  [facility] [phone]   

## 2022-11-23 NOTE — Progress Notes (Signed)
Virtual Visit via Telephone Note  I connected with CLESTINE RISNER on 11/24/22 at  8:30 AM EDT by telephone and verified that I am speaking with the correct person using two identifiers.  Location: Patient: Home Provider: Office    Shared Decision Making Visit Lung Cancer Screening Program [phone])   Eligibility: Age 62 y.o. Pack Years Smoking History Calculation 55 (# packs/per year x # years smoked) Recent History of coughing up blood  no Unexplained weight loss? no ( >Than 15 pounds within the last 6 months ) Prior History Lung / other cancer no (Diagnosis within the last 5 years already requiring surveillance chest CT Scans). Smoking Status Former Smoker Former Smokers: Years since quit: 12 years  Quit Date: September 2012  Visit Components: Discussion included one or more decision making aids. yes Discussion included risk/benefits of screening. yes Discussion included potential follow up diagnostic testing for abnormal scans. yes Discussion included meaning and risk of over diagnosis. yes Discussion included meaning and risk of False Positives. yes Discussion included meaning of total radiation exposure. yes  Counseling Included: Importance of adherence to annual lung cancer LDCT screening. yes Impact of comorbidities on ability to participate in the program. yes Ability and willingness to under diagnostic treatment. yes  Smoking Cessation Counseling: Current Smokers:  Discussed importance of smoking cessation. yes Information about tobacco cessation classes and interventions provided to patient. yes Patient provided with "ticket" for LDCT Scan. NA Symptomatic Patient. no  Counseling(Intermediate counseling: > three minutes) 99406 Diagnosis Code: Tobacco Use Z72.0 Asymptomatic Patient yes  Counseling (Intermediate counseling: > three minutes counseling) U0454 Former Smokers:  Discussed the importance of maintaining cigarette abstinence. yes Diagnosis Code:  Personal History of Nicotine Dependence. U98.119 Information about tobacco cessation classes and interventions provided to patient. Yes Patient provided with "ticket" for LDCT Scan. NA Written Order for Lung Cancer Screening with LDCT placed in Epic. Yes (CT Chest Lung Cancer Screening Low Dose W/O CM) [id number] Z12.2-Screening of respiratory organs Z87.891-Personal history of nicotine dependence  I have spent 25 minutes of face to face/ virtual visit   time with Ms Marcial discussing the risks and benefits of lung cancer screening. We viewed / discussed a power point together that explained in detail the above noted topics. We paused at intervals to allow for questions to be asked and answered to ensure understanding.We discussed that the single most powerful action that she can take to decrease her risk of developing lung cancer is to quit smoking. We discussed whether or not she is ready to commit to setting a quit date. We discussed options for tools to aid in quitting smoking including nicotine replacement therapy, non-nicotine medications, support groups, Quit Smart classes, and behavior modification. We discussed that often times setting smaller, more achievable goals, such as eliminating 1 cigarette a day for a week and then 2 cigarettes a day for a week can be helpful in slowly decreasing the number of cigarettes smoked. This allows for a sense of accomplishment as well as providing a clinical benefit. I provided her  with smoking cessation  information  with contact information for community resources, classes, free nicotine replacement therapy, and access to mobile apps, text messaging, and on-line smoking cessation help. I have also provided her  the office contact information in the event she needs to contact me, or the screening staff. We discussed the time and location of the scan, and that either Abigail Miyamoto RN, Karlton Lemon, RN  or I will call / send a  letter with the results within 24-72  hours of receiving them. The patient verbalized understanding of all of  the above and had no further questions upon leaving the office. They have my contact information in the event they have any further questions.  I spent 3-5 minutes counseling on smoking cessation and the health risks of continued tobacco abuse.  I explained to the patient that there has been a high incidence of coronary artery disease noted on these exams. I explained that this is a non-gated exam therefore degree or severity cannot be determined. This patient is not on statin therapy. I have asked the patient to follow-up with their PCP regarding any incidental finding of coronary artery disease and management with diet or medication as their PCP  feels is clinically indicated. The patient verbalized understanding of the above and had no further questions upon completion of the visit.   Glenford Bayley, NP

## 2022-11-24 ENCOUNTER — Encounter: Payer: Self-pay | Admitting: Primary Care

## 2022-11-24 ENCOUNTER — Other Ambulatory Visit: Payer: Self-pay

## 2022-11-24 ENCOUNTER — Ambulatory Visit
Admission: RE | Admit: 2022-11-24 | Discharge: 2022-11-24 | Disposition: A | Discharge status: Home / Self Care | Payer: Managed Care, Other (non HMO) | Source: Ambulatory Visit | Attending: Acute Care | Admitting: Acute Care

## 2022-11-24 ENCOUNTER — Ambulatory Visit (INDEPENDENT_AMBULATORY_CARE_PROVIDER_SITE_OTHER): Payer: Managed Care, Other (non HMO) | Admitting: Primary Care

## 2022-11-24 DIAGNOSIS — Z87891 Personal history of nicotine dependence: Secondary | ICD-10-CM

## 2022-11-24 DIAGNOSIS — Z122 Encounter for screening for malignant neoplasm of respiratory organs: Secondary | ICD-10-CM

## 2022-11-24 MED ORDER — METHIMAZOLE 5 MG PO TABS: 7.5000 mg | ORAL_TABLET | Freq: Every day | ORAL | 2 refills | Status: DC

## 2022-11-27 ENCOUNTER — Other Ambulatory Visit: Payer: Self-pay

## 2022-11-27 MED ORDER — NORTRIPTYLINE HCL 50 MG PO CAPS: 50.0000 mg | ORAL_CAPSULE | Freq: Every day | ORAL | 0 refills | Status: DC

## 2022-11-28 ENCOUNTER — Other Ambulatory Visit: Payer: Self-pay | Admitting: Family Medicine

## 2022-11-28 ENCOUNTER — Other Ambulatory Visit: Payer: Self-pay

## 2022-11-28 ENCOUNTER — Encounter: Payer: Self-pay | Admitting: Family Medicine

## 2022-11-28 DIAGNOSIS — I2510 Atherosclerotic heart disease of native coronary artery without angina pectoris: Secondary | ICD-10-CM | POA: Insufficient documentation

## 2022-11-28 DIAGNOSIS — J449 Chronic obstructive pulmonary disease, unspecified: Secondary | ICD-10-CM | POA: Insufficient documentation

## 2022-11-28 DIAGNOSIS — I700 Atherosclerosis of aorta: Secondary | ICD-10-CM | POA: Insufficient documentation

## 2022-11-28 MED ORDER — OZEMPIC (1 MG/DOSE) 4 MG/3ML ~~LOC~~ SOPN: PEN_INJECTOR | SUBCUTANEOUS | 1 refills | Status: DC

## 2022-11-28 NOTE — Telephone Encounter (Signed)
Does she have enough to last to her appointment on Friday?

## 2022-11-30 NOTE — Telephone Encounter (Signed)
Routing message to Maralyn Sago as Lorain Childes.   [facility] (supporting Glenford Bayley, NP)2 days ago    I received a letter from my recent lung CT The letter stated that I quit smoking 15 years ago I quit smoking April 06 2011 On my daughter's 31th birthday.  I am not sure how this information Got messed up but it's not been12 years yet. Could you please correct your records. Thanks and have a great day!    Thank you

## 2022-12-01 ENCOUNTER — Ambulatory Visit: Payer: Managed Care, Other (non HMO) | Admitting: Family Medicine

## 2022-12-01 ENCOUNTER — Telehealth: Payer: Self-pay

## 2022-12-01 ENCOUNTER — Encounter: Payer: Self-pay | Admitting: Family Medicine

## 2022-12-01 VITALS — BP 139/81 | HR 86 | Temp 98.9°F | Wt 199.0 lb

## 2022-12-01 DIAGNOSIS — Z7985 Long-term (current) use of injectable non-insulin antidiabetic drugs: Secondary | ICD-10-CM | POA: Diagnosis not present

## 2022-12-01 DIAGNOSIS — E1149 Type 2 diabetes mellitus with other diabetic neurological complication: Secondary | ICD-10-CM | POA: Diagnosis not present

## 2022-12-01 DIAGNOSIS — I700 Atherosclerosis of aorta: Secondary | ICD-10-CM

## 2022-12-01 DIAGNOSIS — J439 Emphysema, unspecified: Secondary | ICD-10-CM | POA: Diagnosis not present

## 2022-12-01 LAB — BAYER DCA HB A1C WAIVED: HB A1C (BAYER DCA - WAIVED): 5.8 % — ABNORMAL HIGH (ref 4.8–5.6)

## 2022-12-01 MED ORDER — SEMAGLUTIDE (2 MG/DOSE) 8 MG/3ML ~~LOC~~ SOPN: 2.0000 mg | PEN_INJECTOR | SUBCUTANEOUS | 1 refills | Status: DC

## 2022-12-01 MED ORDER — MELOXICAM 15 MG PO TABS: 15.0000 mg | ORAL_TABLET | Freq: Every day | ORAL | 1 refills | Status: DC

## 2022-12-01 MED ORDER — VENLAFAXINE HCL ER 150 MG PO CP24: 150.0000 mg | ORAL_CAPSULE | Freq: Every day | ORAL | 1 refills | Status: DC

## 2022-12-01 MED ORDER — DICLOFENAC SODIUM 75 MG PO TBEC: 75.0000 mg | DELAYED_RELEASE_TABLET | Freq: Two times a day (BID) | ORAL | 1 refills | Status: DC

## 2022-12-01 MED ORDER — GABAPENTIN 800 MG PO TABS: 800.0000 mg | ORAL_TABLET | Freq: Two times a day (BID) | ORAL | 1 refills | Status: DC

## 2022-12-01 NOTE — Assessment & Plan Note (Signed)
Found incidentally on CT scan. Keep BP and cholesterol under good control. Continue to monitor.

## 2022-12-01 NOTE — Assessment & Plan Note (Signed)
Doing great with A1c of 5.8. Will increase her ozempic to 2mg  for help with weight loss. Continue to monitor. Call with any concerns.

## 2022-12-01 NOTE — Progress Notes (Signed)
BP 139/81   Pulse 86   Temp 98.9 F (37.2 C) (Oral)   Wt 199 lb (90.3 kg)   SpO2 96%   BMI 33.89 kg/m    Subjective:    Patient ID: Carol Becker, female    DOB: [birth year], 62 y.o.   MRN: [id number]  HPI: Carol Becker is a 62 y.o. female  Chief Complaint  Patient presents with   Diabetes   DIABETES Hypoglycemic episodes:no Polydipsia/polyuria: no Visual disturbance: no Chest pain: no Paresthesias: no Glucose Monitoring: yes Taking Insulin?: no Blood Pressure Monitoring: rarely Retinal Examination: Up to Date Foot Exam: Not up to Date Diabetic Education: Completed Pneumovax: Up to Date Influenza: Up to Date Aspirin: yes  Relevant past medical, surgical, family and social history reviewed and updated as indicated. Interim medical history since our last visit reviewed. Allergies and medications reviewed and updated.  Review of Systems  Constitutional: Negative.   Respiratory: Negative.    Cardiovascular: Negative.   Gastrointestinal: Negative.   Musculoskeletal: Negative.   Neurological: Negative.   Psychiatric/Behavioral: Negative.      Per HPI unless specifically indicated above     Objective:    BP 139/81   Pulse 86   Temp 98.9 F (37.2 C) (Oral)   Wt 199 lb (90.3 kg)   SpO2 96%   BMI 33.89 kg/m   Wt Readings from Last 3 Encounters:  12/01/22 199 lb (90.3 kg)  09/01/22 200 lb (90.7 kg)  08/31/22 203 lb 12.8 oz (92.4 kg)    Physical Exam Vitals and nursing note reviewed.  Constitutional:      General: She is not in acute distress.    Appearance: Normal appearance. She is not ill-appearing, toxic-appearing or diaphoretic.  HENT:     Head: Normocephalic and atraumatic.     Right Ear: External ear normal.     Left Ear: External ear normal.     Nose: Nose normal.     Mouth/Throat:     Mouth: Mucous membranes are moist.     Pharynx: Oropharynx is clear.  Eyes:     General: No scleral icterus.       Right eye: No discharge.         Left eye: No discharge.     Extraocular Movements: Extraocular movements intact.     Conjunctiva/sclera: Conjunctivae normal.     Pupils: Pupils are equal, round, and reactive to light.  Cardiovascular:     Rate and Rhythm: Normal rate and regular rhythm.     Pulses: Normal pulses.     Heart sounds: Normal heart sounds. No murmur heard.    No friction rub. No gallop.  Pulmonary:     Effort: Pulmonary effort is normal. No respiratory distress.     Breath sounds: Normal breath sounds. No stridor. No wheezing, rhonchi or rales.  Chest:     Chest wall: No tenderness.  Musculoskeletal:        General: Normal range of motion.     Cervical back: Normal range of motion and neck supple.  Skin:    General: Skin is warm and dry.     Capillary Refill: Capillary refill takes less than 2 seconds.     Coloration: Skin is not jaundiced or pale.     Findings: No bruising, erythema, lesion or rash.  Neurological:     General: No focal deficit present.     Mental Status: She is alert and oriented to person, place, and time. Mental status is  at baseline.  Psychiatric:        Mood and Affect: Mood normal.        Behavior: Behavior normal.        Thought Content: Thought content normal.        Judgment: Judgment normal.     Results for orders placed or performed during the hospital encounter of 09/01/22  CBC per protocol  Result Value Ref Range   WBC 8.2 4.0 - 10.5 K/uL   RBC 4.32 3.87 - 5.11 MIL/uL   Hemoglobin 12.2 12.0 - 15.0 g/dL   HCT 16.1 09.6 - 04.5 %   MCV 87.5 80.0 - 100.0 fL   MCH 28.2 26.0 - 34.0 pg   MCHC 32.3 30.0 - 36.0 g/dL   RDW 40.9 81.1 - 91.4 %   Platelets 309 150 - 400 K/uL   nRBC 0.0 0.0 - 0.2 %  Surgical pathology  Result Value Ref Range   SURGICAL PATHOLOGY      SURGICAL PATHOLOGY CASE: [id number] PATIENT: [facility] Surgical Pathology Report     Clinical History: gallstones (cm)     FINAL MICROSCOPIC DIAGNOSIS:  A. GALLBLADDER,  CHOLECYSTECTOMY: - Chronic cholecystitis and cholelithiasis      GROSS DESCRIPTION:  Size/?Intact: 7.3 x 3.1 x 1.2 cm previously incised gallbladder, received fresh Serosal surface: Smooth and hyperemic Mucosa/Wall: Mucosa is glistening, bile-stained and the wall measures 0.3 cm in thickness. Contents: The gallbladder contains bile and numerous green-yellow calculi and calculi fragments measuring 0.1 to 0.7 cm. Cystic duct: Patent Block Summary: 1 block submitted (GRP 05/02/2023)    Final Diagnosis performed by Holley Bouche, MD.   Electronically signed 09/04/2022 Technical component performed at [facility]. [facility], [address], [place], [place] [address].  Professional component performed at [facility], [address], [place], [place] [address].  Immunohistochemistry Technical component (if applicable) was performed at [facility]. [address], [place], [place] [address].   IMMUNOHISTOCHEMISTRY DISCLAIMER (if applicable): Some of these immunohistochemical stains may have been developed and the performance characteristics determine by [facility]. Some may not have been cleared or approved by the U.S. Food and Drug Administration. The FDA has determined that such clearance or approval is not necessary. This test is used for clinical purposes. It should not be regarded as investigational or for research. This laboratory is certified under the Clinical Laboratory Improvement Amendments of 1988 (CLIA-88) as qualified to perform high complexity clinical laboratory testing.  The controls stained appropriately.       Assessment & Plan:   Problem List Items Addressed This Visit       Cardiovascular and Mediastinum   Aortic atherosclerosis (HCC)    Found incidentally on CT scan. Keep BP and cholesterol under good control. Continue to monitor.         Respiratory   COPD (chronic obstructive  pulmonary disease) (HCC)    Very mild found incidentally on CT. No symptoms at this time. Continue to monitor. Call with any concerns.         Endocrine   Type 2 diabetes mellitus with neurological complications (HCC) - Primary    Doing great with A1c of 5.8. Will increase her ozempic to 2mg  for help with weight loss. Continue to monitor. Call with any concerns.       Relevant Medications   Semaglutide, 2 MG/DOSE, 8 MG/3ML SOPN   Other Relevant Orders   Bayer DCA Hb A1c Waived  Follow up plan: Return in about 3 months (around 03/03/2023).

## 2022-12-01 NOTE — Assessment & Plan Note (Signed)
Very mild found incidentally on CT. No symptoms at this time. Continue to monitor. Call with any concerns.

## 2022-12-01 NOTE — Telephone Encounter (Signed)
Received a fax from [facility] stating that patient has Semaglutide listed an allergy in her chart. Patient was prescribed medication at today's visit. Please advise?

## 2022-12-03 NOTE — Telephone Encounter (Signed)
She is fine to take it.

## 2022-12-05 ENCOUNTER — Other Ambulatory Visit (INDEPENDENT_AMBULATORY_CARE_PROVIDER_SITE_OTHER): Payer: Managed Care, Other (non HMO)

## 2022-12-05 ENCOUNTER — Other Ambulatory Visit: Payer: Self-pay

## 2022-12-05 DIAGNOSIS — E0590 Thyrotoxicosis, unspecified without thyrotoxic crisis or storm: Secondary | ICD-10-CM

## 2022-12-05 DIAGNOSIS — Z87891 Personal history of nicotine dependence: Secondary | ICD-10-CM

## 2022-12-05 DIAGNOSIS — Z122 Encounter for screening for malignant neoplasm of respiratory organs: Secondary | ICD-10-CM

## 2022-12-05 DIAGNOSIS — D3502 Benign neoplasm of left adrenal gland: Secondary | ICD-10-CM | POA: Diagnosis not present

## 2022-12-05 LAB — TSH: TSH: 1.44 u[IU]/mL (ref 0.35–5.50)

## 2022-12-05 LAB — T4, FREE: Free T4: 0.71 ng/dL (ref 0.60–1.60)

## 2022-12-06 ENCOUNTER — Encounter: Payer: Self-pay | Admitting: Family Medicine

## 2022-12-06 ENCOUNTER — Other Ambulatory Visit: Payer: Self-pay | Admitting: Family Medicine

## 2022-12-06 MED ORDER — GABAPENTIN 800 MG PO TABS: ORAL_TABLET | ORAL | 1 refills | Status: DC

## 2022-12-06 NOTE — Telephone Encounter (Signed)
Spoke with [facility] representative to provide her with the verbal OK for patient to take the prescribed medication. Representative verbalized understanding and says she will document the verbal and process for them to move forward with filling the prescription.

## 2022-12-06 NOTE — Telephone Encounter (Signed)
Please find out what's going on with the pharmacy

## 2022-12-08 ENCOUNTER — Encounter: Payer: Self-pay | Admitting: Internal Medicine

## 2022-12-08 ENCOUNTER — Ambulatory Visit: Payer: Managed Care, Other (non HMO) | Admitting: Internal Medicine

## 2022-12-08 VITALS — BP 118/74 | HR 87 | Ht 64.2 in | Wt 198.0 lb

## 2022-12-08 DIAGNOSIS — E0500 Thyrotoxicosis with diffuse goiter without thyrotoxic crisis or storm: Secondary | ICD-10-CM

## 2022-12-08 DIAGNOSIS — D3502 Benign neoplasm of left adrenal gland: Secondary | ICD-10-CM | POA: Diagnosis not present

## 2022-12-08 DIAGNOSIS — E0590 Thyrotoxicosis, unspecified without thyrotoxic crisis or storm: Secondary | ICD-10-CM

## 2022-12-08 MED ORDER — METHIMAZOLE 5 MG PO TABS: 5.0000 mg | ORAL_TABLET | Freq: Every day | ORAL | 2 refills | Status: DC

## 2022-12-08 NOTE — Progress Notes (Signed)
Name: Carol Becker  MRN/ DOB: [id number], [birth year]    Age/ Sex: 62 y.o., female    PCP: Dorcas Carrow, DO   Reason for Endocrinology Evaluation: Hyperthyroidism     Date of Initial Endocrinology Evaluation: 12/08/2022     HPI: Carol Becker is a 62 y.o. 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 The patient presented for initial endocrinology clinic visit on 12/08/2022 for consultative assistance with her Hyperthyroidism.   Patient has been diagnosed with hypothyroidism > 15 yrs ago , she was started on LT-for replacement 25 mcg daily and stayed on it for years.  She discontinued LT-for replacement approximately 2021 due to side effects.  By November, 2021 the patient has been noted with hyperthyroidism and was started on thionamides therapy.   No prior XRT Granddaughter with thyroid disease  TRAb elevated   ADRENAL HISTORY:  During evaluation for breast cancer in 03/2021 she was noted with adrenal gland adenoma on CT imaging.  CT imaging 08/2021 showed a 1.5 cm indeterminant left adrenal nodule.   Of note, the patient follows with neurology and has chronic vertigo/vestibular migraines  Patient transitioned care from [facility] endocrinology   SUBJECTIVE:    Today (12/08/22): Carol Becker is here for follow-up on hyperthyroidism and adrenal adenoma   Denies local neck swelling  Has noted weight loss with Ozempic  Denies tremors  Has occasional palpitations at night when she rolls over , she stopped caffeine after cholecystectomy Denies eye symptoms  Has constipation but no diarrhea that she attributes to Ozempic  NO biotin     Methimazole 5 mg, 1.5 tabs daily       HISTORY:  Past Medical History:  Past Medical History:  Diagnosis Date   Adrenal nodule (HCC)    Anxiety    Arthritis    knees, left thumb, shoulders   Breast  cancer (HCC)    Left   Cervical spinal stenosis    Family history of breast cancer    GERD (gastroesophageal reflux disease)    History of hypothyroidism    Hyperlipidemia    poss history of   Hyperthyroidism    Hypothyroidism    Osteopenia    PONV (postoperative nausea and vomiting)    after wisdom teeth   Pre-diabetes    Restless leg syndrome    Sleep apnea    tested ~2012-" lost a lot of weight, no longer has sleep apnea."- per patient   Tremors of nervous system    medication related   Vertigo    Vestibular migraine    Past Surgical History:  Past Surgical History:  Procedure Laterality Date   ANTERIOR / POSTERIOR COMBINED FUSION CERVICAL SPINE     BREAST BIOPSY Left 03/15/2021   Stereo bx, Ribbon Clip, path pending   BREAST LUMPECTOMY Left    CHOLECYSTECTOMY N/A 09/01/2022   Procedure: LAPAROSCOPIC CHOLECYSTECTOMY;  Surgeon: Fritzi Mandes, MD;  Location: [facility];  Service: General;  Laterality: N/A;   COLONOSCOPY WITH PROPOFOL N/A 11/05/2020   Procedure: COLONOSCOPY WITH PROPOFOL;  Surgeon: Midge Minium, MD;  Location: [facility];  Service: Endoscopy;  Laterality: N/A;   CYSTECTOMY Left    KNEE ARTHROSCOPY WITH EXCISION BAKER'S CYST Left    LAPAROSCOPY     Exploratory   POLYPECTOMY  11/05/2020   Procedure: POLYPECTOMY;  Surgeon: Midge Minium, MD;  Location: [facility];  Service: Endoscopy;;   SHOULDER ARTHROSCOPY WITH ROTATOR CUFF REPAIR Left 12/01/2021   Procedure: Left shoulder arthroscopy, debridement, subacromial decompression, distal clavicle resection, chondroplasty;  Surgeon: Francena Hanly, MD;  Location: [facility];  Service: Orthopedics;  Laterality: Left;    TOTAL MASTECTOMY Bilateral    Per patient "Tummy tissue reconstructive surgery" same time   TOTAL SHOULDER ARTHROPLASTY Left 03/16/2022   Procedure: TOTAL SHOULDER ARTHROPLASTY;  Surgeon: Francena Hanly, MD;  Location: [facility];  Service: Orthopedics;  Laterality: Left;   trapeziectomy  Left    done at [facility] 06/16/20   TUBAL LIGATION     WISDOM TOOTH EXTRACTION      Social History:  reports that she quit smoking about 11 years ago. Her smoking use included cigarettes. She has never used smokeless tobacco. She reports that she does not currently use alcohol. She reports that she does not use drugs. Family History: family history includes Alzheimer's disease in her maternal grandfather; Bipolar disorder in her daughter; Breast cancer in her cousin; Breast cancer (age of onset: 47) in her paternal grandmother; Breast cancer (age of onset: 43) in her maternal grandmother; Cancer in her maternal grandmother and paternal grandmother; Heart disease in her cousin and father; Heart disease (age of onset: 26) in her sister; Parkinson's disease in her sister.   HOME MEDICATIONS: Allergies as of 12/08/2022       Reactions   Crestor [rosuvastatin Calcium] Other (See Comments)   Indigestion   Sulfa Antibiotics Nausea Only   Zithromax [azithromycin] Other (See Comments)   Not effective        Medication List        Accurate as of Dec 08, 2022  5:19 PM. If you have any questions, ask your nurse or doctor.          diclofenac 75 MG EC tablet Commonly known as: VOLTAREN Take 1 tablet (75 mg total) by mouth 2 (two) times daily. Do not take with meloxicam   gabapentin 800 MG tablet Commonly known as: NEURONTIN Take 1 pill in the AM and 2 pills in the PM   meloxicam 15 MG tablet Commonly known as: MOBIC Take 1 tablet (15 mg total) by mouth daily.   methimazole 5 MG tablet Commonly known as: TAPAZOLE Take 1 tablet (5 mg total) by mouth daily. Taking 1 & 1/2 tablets daily What changed: how much to take Changed by: Scarlette Shorts, MD   nortriptyline 50 MG capsule Commonly known as: PAMELOR Take 1 capsule (50 mg total) by mouth at bedtime.   omeprazole 20 MG capsule Commonly known as: PRILOSEC Take 20 mg by mouth daily.   Semaglutide (2 MG/DOSE) 8 MG/3ML  Sopn Inject 2 mg as directed once a week.   venlafaxine XR 150 MG 24 hr capsule Commonly known as: EFFEXOR-XR Take 1 capsule (150 mg total) by mouth at bedtime.   VITAMIN B-12 PO Take 2 tablets by mouth daily. Gummy          REVIEW OF SYSTEMS: A comprehensive ROS was conducted with the patient and is negative except as per HPI     OBJECTIVE:  VS: BP 118/74 (BP Location: Right Arm, Patient Position: Sitting, Cuff Size: Large)   Pulse 87   Ht 5' 4.2" (1.631 m)   Wt 198 lb (89.8 kg)   SpO2 97%   BMI 33.78 kg/m    Wt Readings from Last 3 Encounters:  12/08/22 198 lb (89.8 kg)  12/01/22 199 lb (90.3 kg)  09/01/22 200 lb (90.7 kg)     EXAM: General: Pt appears well and is in NAD  Neck: General: Supple without adenopathy. Thyroid: Thyroid size normal.  No goiter or nodules appreciated.  Lungs: Clear with good BS bilat   Heart: Auscultation: RRR.  Abdomen: soft, nontender  Extremities:  BL LE: No pretibial edema   Mental Status: Judgment, insight: Intact Orientation: Oriented to time, place, and person Mood and affect: No depression, anxiety, or agitation     DATA REVIEWED:   Latest Reference Range & Units 12/05/22 08:16  TSH 0.35 - 5.50 uIU/mL 1.44  T4,Free(Direct) 0.60 - 1.60 ng/dL 6.04      Latest Reference Range & Units 08/09/22 10:03  Sodium 135 - 145 mEq/L 137  Potassium 3.5 - 5.1 mEq/L 4.3  Chloride 96 - 112 mEq/L 102  CO2 19 - 32 mEq/L 29  Glucose 70 - 99 mg/dL 88  BUN 6 - 23 mg/dL 10  Creatinine 5.40 - 9.81 mg/dL 1.91  Calcium 8.4 - 47.8 mg/dL 9.6  Alkaline Phosphatase 39 - 117 U/L 100  Albumin 3.5 - 5.2 g/dL 4.5  AST 0 - 37 U/L 23  ALT 0 - 35 U/L 22  Total Protein 6.0 - 8.3 g/dL 7.7  Total Bilirubin 0.2 - 1.2 mg/dL 0.3  GFR >29.56 mL/min 67.70      ASSESSMENT/PLAN/RECOMMENDATIONS:   Hyperthyroidism:  -Secondary to Graves' disease - Pt is clinically euthyroid  -No local neck swelling  -TFTs normal, will reduce methimazole and  recheck in 2 months  Medications : Decrease methimazole 5 mg 1 tab daily   2. Left adrenal Adenoma :  -Adrenal imaging shows stability in 2023 -Aldo, renin, Aldo/renin ratio normal 07/2022 -She has submitted 24-hour urine 3 days ago for cortisol and metanephrines, awaiting on results   3.  Graves' disease:   -No extrathyroidal manifestations of Graves' disease   F/U in 6 months  Labs in 2 months   Signed electronically by: Lyndle Herrlich, MD  [facility] End[facility]  [facility] [address] [place] [place], [place] [address] Phone: [phone] FAX: [phone]   CC: Dorcas Carrow DO [address] [place] [address] Phone: [phone] Fax: [phone]   Return to Endocrinology clinic as below: Future Appointments  Date Time Provider Department Center  02/15/2023  2:45 PM Ihor Austin, NP [facility] None  03/09/2023  8:20 AM Dorcas Carrow, DO [facility]  03/20/2023  1:00 PM [place], Peter M, MD [facility] None

## 2022-12-08 NOTE — Patient Instructions (Signed)
Selenium 200 mcg daily.

## 2022-12-09 LAB — CORTISOL, URINE, 24 HOUR
24 Hour urine volume (VMAHVA): 3000 mL
CREATININE, URINE: 1.11 g/(24.h) (ref 0.50–2.15)
Cortisol (Ur), Free: 41.6 mcg/24 h (ref 4.0–50.0)

## 2022-12-11 LAB — TIQ-MISC

## 2022-12-14 ENCOUNTER — Other Ambulatory Visit: Payer: Self-pay

## 2022-12-14 MED ORDER — METHIMAZOLE 5 MG PO TABS: 5.0000 mg | ORAL_TABLET | Freq: Every day | ORAL | 2 refills | Status: DC

## 2022-12-20 LAB — CATECHOLAMINES, FRACTIONATED, URINE, 24 HOUR
Calc Total (E+NE): 50 mcg/24 h (ref 26–121)
Creatinine, Urine mg/day-CATEUR: 1.27 g/(24.h) (ref 0.50–2.15)
Dopamine 24 Hr Urine: 217 mcg/24 h (ref 52–480)
Norepinephrine, 24H, Ur: 50 mcg/24 h (ref 15–100)
Total Volume: 3500 mL

## 2023-01-19 ENCOUNTER — Telehealth: Payer: Self-pay | Admitting: Family Medicine

## 2023-01-19 DIAGNOSIS — M25512 Pain in left shoulder: Secondary | ICD-10-CM

## 2023-01-19 NOTE — Telephone Encounter (Signed)
Copied from CRM [id number]. Topic: Referral - Request for Referral >> Jan 19, 2023  1:55 PM Phill Myron wrote: Reason for CRM : Ms Marlette would like a referral to an ortho doctor. (Does not want to go to [facility])  Can you look at the [facility] providers.    This is for her left arm injury.Marland KitchenMarland Kitchen

## 2023-02-01 ENCOUNTER — Encounter: Payer: Self-pay | Admitting: Family Medicine

## 2023-02-05 ENCOUNTER — Other Ambulatory Visit (INDEPENDENT_AMBULATORY_CARE_PROVIDER_SITE_OTHER): Payer: Managed Care, Other (non HMO)

## 2023-02-05 DIAGNOSIS — E0590 Thyrotoxicosis, unspecified without thyrotoxic crisis or storm: Secondary | ICD-10-CM

## 2023-02-05 LAB — TSH: TSH: 0.04 u[IU]/mL — ABNORMAL LOW (ref 0.35–5.50)

## 2023-02-05 LAB — T4, FREE: Free T4: 0.9 ng/dL (ref 0.60–1.60)

## 2023-02-06 ENCOUNTER — Other Ambulatory Visit: Payer: Self-pay | Admitting: Internal Medicine

## 2023-02-06 MED ORDER — METHIMAZOLE 5 MG PO TABS: 7.5000 mg | ORAL_TABLET | Freq: Every day | ORAL | 2 refills | Status: DC

## 2023-02-07 ENCOUNTER — Encounter: Payer: Self-pay | Admitting: Family Medicine

## 2023-02-10 ENCOUNTER — Other Ambulatory Visit: Payer: Self-pay | Admitting: Psychiatry

## 2023-02-12 NOTE — Telephone Encounter (Signed)
Last seen on 06/11/22 Follow up scheduled on 02/15/23

## 2023-02-15 ENCOUNTER — Ambulatory Visit: Payer: Managed Care, Other (non HMO) | Admitting: Adult Health

## 2023-03-08 NOTE — Progress Notes (Addendum)
Referring:  Dorcas Carrow, DO [address] [place],  [place] [address]  PCP: Dorcas Carrow, DO  Neurology was asked to evaluate Carol Becker, a 61 for a chief complaint of headaches.  Our recommendations of care will be communicated by shared medical record.    CC:  vertigo   History provided from self, husband  HPI:   Consult visit 03/07/2022 Dr. Delena Bali: Medical co-morbidities: DM2, hyperthyroidism, anxiety  The patient presents for evaluation of vertigo which began several years ago. This worsened when she had a lumpectomy in 2022. She has been diagnosed with vestibular migraine, though she reports she has never had a headache associated with it. She does report a history of "sinus headaches". Vertigo can last for several hours at a time. It is triggered by busy visual patterns, like grocery stores with multiple items in the aisles. Can be triggered by riding in the car when she does not know which direction it will turn. Vertigo can last several hours and she will sometimes need to sleep it off. Vertigo is associated with nausea, and she takes dramamine and meclizine as needed. Tried Zofran previously which was ineffective. She takes nortriptyline at bedtime which has helped reduce the frequency of her vertigo. She is also on Effexor for anxiety.  She has a history of BPPV which feels different from her current vertigo. This vertigo is positional and resolves with Epley maneuver. Did vestibular therapy previously including visual fixation exercises, which she did not find helpful.  She has also started to get intermittently disoriented. Has gotten lost while she was driving. Just last week went to the restroom at [facility] and completely forgot where she was.  Also notices a tremor with fine motor movements which occurs when she overexerts herself. Of note, recent TSH was <0.01 and she is currently having her thyroid medications adjusted.  Vision has gotten blurry over the past year.  Initially was intermittent but now it is all the time.   Update 06/15/2022 JM: Patient returns for 59-month follow-up accompanied by her husband.  At prior visit, venlafaxine dosage decreased to 150mg  and increase nortriptyline dosage to 50mg  for possible vestibular migraines, also provided Ubrelvy samples to take at onset of vertigo attacks.    Reports since prior visit, she has not experienced any migraine headaches or vertigo episodes.  She has been tolerating medication dosage adjustments well.  She has not tried [place] as she has not had any additional episodes.   Prior complaints of blurry vision, diagnosed with bilateral cataracts which were removed in October, no longer has blurred vision.  Does have continued cognitive complaints.  Continues to get disoriented while driving.  Has been present over the past 4 to 5 months.  She has been gradually decreasing Klonopin dosage and is hopeful to be able to completely stop by the end of this week.  She is also completely discontinued BuSpar.  Denies any progression since onset.  Of note, prior TSH was <0.01 and is currently awaiting to be seen by [facility] endocrinology for further management but does not have initial evaluation until May.    Update 03/13/2023 JM: Patient returns for follow-up visit accompanied by her husband.  At prior visit, no recent migraines or vertigo episodes, continued on nortriptyline and venlafaxine for preventative and Ubrelvy for rescue.  Reports doing very well up until 2 weeks ago, has had 3 vertigo episodes. Still triggered by visual overstimulation.  Associated with nausea/motion sickness sensation, use of Dramamine with some  benefit. Did try Bernita Raisin but no benefit. Will need to lay down.  Continues on nortriptyline 50 mg and venlafaxine 150 mg. Reports overall not feeling well over the past 2 weeks, has been having mid abdominal pain over the past 2 weeks, is scheduled to see PCP 10/3. Denies any changes to medication or  diet.   Reports continued cognitive issues with some worsening since prior visit. MMSE 30/30. Forgets words frequently, more frequent occurrences of getting lost while driving, forgetting what she is in the middle of doing or what she is looking for (more noticeable at work). Does report history of Alzheimer's in both grandparents and mother.  Has since establish care with endocrinology for hyperthyroidism - recent adjustments made to methimazole d/t TSH 0.04  Reports being diagnosed with COPD and CAD since prior visit.      Current Treatment: Abortive Dramamine  Preventative Nortriptyline 50 mg QHS Effexor 150 mg daily  Prior Therapies                                 Effexor 225 mg daily Nortriptyline 30 mg QHS Gabapentin 600/1200 Seroquel - tremor Ubrelvy - no benefit     LABS: CBC    Component Value Date/Time   WBC 8.2 09/01/2022 0905   RBC 4.32 09/01/2022 0905   HGB 12.2 09/01/2022 0905   HGB 12.4 08/31/2022 1518   HCT 37.8 09/01/2022 0905   HCT 38.2 08/31/2022 1518   PLT 309 09/01/2022 0905   PLT 320 08/31/2022 1518   MCV 87.5 09/01/2022 0905   MCV 88 08/31/2022 1518   MCH 28.2 09/01/2022 0905   MCHC 32.3 09/01/2022 0905   RDW 14.1 09/01/2022 0905   RDW 14.5 08/31/2022 1518   LYMPHSABS 2.3 08/31/2022 1518   EOSABS 0.1 08/31/2022 1518   BASOSABS 0.1 08/31/2022 1518      Latest Ref Rng & Units 08/31/2022    3:18 PM 08/09/2022   10:03 AM 03/10/2022   10:50 AM  CMP  Glucose 70 - 99 mg/dL 60  88  960   BUN 8 - 27 mg/dL 11  10  18    Creatinine 0.57 - 1.00 mg/dL 4.54  0.98  1.19   Sodium 134 - 144 mmol/L 140  137  139   Potassium 3.5 - 5.2 mmol/L 4.1  4.3  4.7   Chloride 96 - 106 mmol/L 102  102  105   CO2 20 - 29 mmol/L 23  29  28    Calcium 8.7 - 10.3 mg/dL 9.1  9.6  9.8   Total Protein 6.0 - 8.5 g/dL 6.9  7.7    Total Bilirubin 0.0 - 1.2 mg/dL 0.2  0.3    Alkaline Phos 44 - 121 IU/L 118  100    AST 0 - 40 IU/L 23  23    ALT 0 - 32 IU/L 20  22        IMAGING:  MRI brain 09/03/20: Brain unremarkable. 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         Current Outpatient Medications on File Prior to Visit  Medication Sig Dispense Refill   Cyanocobalamin (VITAMIN B-12 PO) Take 2 tablets by mouth daily. Gummy     diclofenac (VOLTAREN) 75 MG EC tablet Take 1 tablet (75 mg total) by mouth 2 (two) times daily. Do not take with meloxicam 60 tablet 1   gabapentin (NEURONTIN) 800 MG  tablet Take 1 pill in the AM and 2 pills in the PM 225 tablet 1   meloxicam (MOBIC) 15 MG tablet Take 1 tablet (15 mg total) by mouth daily. 90 tablet 1   methimazole (TAPAZOLE) 5 MG tablet Take 1.5 tablets (7.5 mg total) by mouth daily. 135 tablet 2   nortriptyline (PAMELOR) 50 MG capsule TAKE 1 CAPSULE AT BEDTIME 90 capsule 0   omeprazole (PRILOSEC) 20 MG capsule Take 20 mg by mouth daily.     Semaglutide, 2 MG/DOSE, 8 MG/3ML SOPN Inject 2 mg as directed once a week. 9 mL 1   venlafaxine XR (EFFEXOR-XR) 150 MG 24 hr capsule Take 1 capsule (150 mg total) by mouth at bedtime. 90 capsule 1   No current facility-administered medications on file prior to visit.     Allergies: Allergies  Allergen Reactions   Crestor [Rosuvastatin Calcium] Other (See Comments)    Indigestion   Sulfa Antibiotics Nausea Only   Zithromax [Azithromycin] Other (See Comments)    Not effective    Family History: Migraine or other headaches in the family:  no Aneurysms in a first degree relative:  no Brain tumors in the family:  no Other neurological illness in the family:   Alzheimer's  Past Medical History: Past Medical History:  Diagnosis Date   Adrenal nodule (HCC)    Anxiety    Arthritis    knees, left thumb, shoulders   Breast cancer (HCC)    Left   Cervical spinal stenosis    Family history of breast cancer    GERD (gastroesophageal reflux disease)    History of hypothyroidism    Hyperlipidemia    poss  history of   Hyperthyroidism    Hypothyroidism    Osteopenia    PONV (postoperative nausea and vomiting)    after wisdom teeth   Pre-diabetes    Restless leg syndrome    Sleep apnea    tested ~2012-" lost a lot of weight, no longer has sleep apnea."- per patient   Tremors of nervous system    medication related   Vertigo    Vestibular migraine     Past Surgical History Past Surgical History:  Procedure Laterality Date   ANTERIOR / POSTERIOR COMBINED FUSION CERVICAL SPINE     BREAST BIOPSY Left 03/15/2021   Stereo bx, Ribbon Clip, path pending   BREAST LUMPECTOMY Left    CHOLECYSTECTOMY N/A 09/01/2022   Procedure: LAPAROSCOPIC CHOLECYSTECTOMY;  Surgeon: Fritzi Mandes, MD;  Location: [facility];  Service: General;  Laterality: N/A;   COLONOSCOPY WITH PROPOFOL N/A 11/05/2020   Procedure: COLONOSCOPY WITH PROPOFOL;  Surgeon: Midge Minium, MD;  Location: [facility];  Service: Endoscopy;  Laterality: N/A;   CYSTECTOMY Left    KNEE ARTHROSCOPY WITH EXCISION BAKER'S CYST Left    LAPAROSCOPY     Exploratory   POLYPECTOMY  11/05/2020   Procedure: POLYPECTOMY;  Surgeon: Midge Minium, MD;  Location: [facility];  Service: Endoscopy;;   SHOULDER ARTHROSCOPY WITH ROTATOR CUFF REPAIR Left 12/01/2021   Procedure: Left shoulder arthroscopy, debridement, subacromial decompression, distal clavicle resection, chondroplasty;  Surgeon: Francena Hanly, MD;  Location: [facility];  Service: Orthopedics;  Laterality: Left;    TOTAL MASTECTOMY Bilateral    Per patient "Tummy tissue reconstructive surgery" same time   TOTAL SHOULDER ARTHROPLASTY Left 03/16/2022   Procedure: TOTAL SHOULDER ARTHROPLASTY;  Surgeon: Francena Hanly, MD;  Location: [facility];  Service: Orthopedics;  Laterality: Left;   trapeziectomy Left  done at [facility] 06/16/20   TUBAL LIGATION     WISDOM TOOTH EXTRACTION      Social History: Social History   Tobacco Use   Smoking status: Former    Current packs/day: 0.00     Types: Cigarettes    Start date: 04/05/1981    Quit date: 04/06/2011    Years since quitting: 11.9   Smokeless tobacco: Never  Vaping Use   Vaping status: Former   Quit date: 04/05/2013  Substance Use Topics   Alcohol use: Not Currently    Comment: On Occasion   Drug use: No    ROS: Negative for fevers, chills. Positive for vertigo. All other systems reviewed and negative unless stated otherwise in HPI.   Physical Exam:   Vital Signs: BP 136/82 (BP Location: Right Arm, Patient Position: Sitting, Cuff Size: Small)   Pulse 79   Ht 5\' 4"  (1.626 m)   Wt 196 lb 12.8 oz (89.3 kg)   BMI 33.78 kg/m  GENERAL: well appearing,in no acute distress,alert SKIN:  Color, texture, turgor normal. No rashes or lesions HEAD:  Normocephalic/atraumatic. CV:  RRR RESP: Normal respiratory effort MSK: no tenderness to palpation over occiput, neck, or shoulders  NEUROLOGICAL: Mental Status: Alert, oriented to person, place and time, subjective short-term memory loss, follows commands Cranial Nerves: PERRL, visual fields intact to confrontation, extraocular movements intact, facial sensation intact, no facial droop or ptosis, hearing grossly intact, no dysarthria Motor: muscle strength 5/5 both upper and lower extremities. No tremor Reflexes: 2+ throughout Sensation: intact to light touch all 4 extremities Coordination: Finger-to- nose-finger intact bilaterally Gait: normal-based     03/13/2023   10:27 AM  MMSE - Mini Mental State Exam  Orientation to time 5  Orientation to Place 5  Registration 3  Attention/ Calculation 5  Recall 3  Language- name 2 objects 2  Language- repeat 1  Language- follow 3 step command 3  Language- read & follow direction 1  Write a sentence 1  Copy design 1  Total score 30        IMPRESSION: 62 year old female with a history of DM2, hyperthyroidism, anxiety who presents for evaluation of vertigo, initial consult visit with Dr. Delena Bali on 03/07/2022. Per  Dr. Delena Bali "While vertigo is not associated with headache, she does report a history of 'sinus headaches'. Associated phonophobia and prolonged duration of vertigo episodes does raise suspicion for vestibular migraines."  Also c/o cognitive difficulties, suspected in setting of untreated hyperthyroidism.  Initially great improvement of suspected vestibular migraines on nortriptyline and venlafaxine but restarted about 2 weeks ago with 3 episodes of vertigo and generally not feeling well. Also reports gradual decline of cognition although MMSE today 30/30. Reports fm hx of Alzheimer's dementia    PLAN: -Recommend completing MRI brain d/t subjective cognitive decline concern -Complete lab work including CMP, CBC, and biomarkers for Alzheimer's disease -if MR brain and lab work unremarkable, can consider neurocognitive evaluation -Continue nortriptyline 50 mg nightly and venlafaxine 150 mg daily, advised to call if episodes persist to discuss dosage adjustment or consider vestibular rehab -trial Nurtec as needed for rescue, sample provided, will call if beneficial, no benefit with Bernita Raisin -Continue to follow with endocrinology for hyperthyroidism     Follow-up in 6 months or call earlier if needed     I spent 30 minutes of face-to-face and non-face-to-face time with patient and husband.  This included previsit chart review, lab review, study review, order entry, electronic health record documentation, patient  and husband education and discussion regarding the above and answered all the questions to patient and husband satisfaction  Ihor Austin, AGNP-BC  [facility] [address] [place], [place] [address]  Phone [phone] Fax [phone] Note: This document was prepared with digital dictation and possible smart phrase technology. Any transcriptional errors that result from this process are unintentional.

## 2023-03-09 ENCOUNTER — Ambulatory Visit: Payer: Managed Care, Other (non HMO) | Admitting: Family Medicine

## 2023-03-13 ENCOUNTER — Encounter: Payer: Self-pay | Admitting: Adult Health

## 2023-03-13 ENCOUNTER — Telehealth: Payer: Self-pay | Admitting: Adult Health

## 2023-03-13 ENCOUNTER — Ambulatory Visit: Payer: Managed Care, Other (non HMO) | Admitting: Adult Health

## 2023-03-13 VITALS — BP 136/82 | HR 79 | Ht 64.0 in | Wt 196.8 lb

## 2023-03-13 DIAGNOSIS — R413 Other amnesia: Secondary | ICD-10-CM | POA: Diagnosis not present

## 2023-03-13 DIAGNOSIS — E0590 Thyrotoxicosis, unspecified without thyrotoxic crisis or storm: Secondary | ICD-10-CM

## 2023-03-13 DIAGNOSIS — G43809 Other migraine, not intractable, without status migrainosus: Secondary | ICD-10-CM

## 2023-03-13 NOTE — Patient Instructions (Addendum)
You will be called to schedule an MRI brain due to continued memory decline and recent worsening of vertigo   We will check lab work today   If all above testing normal, can consider neurocognitive evaluation   Continue venlafaxine and nortriptyline for prevention  Try use of Nurtec to take at onset of symptoms - please let me know if beneficial and I will write order     Follow up in 6 months or call earlier if needed

## 2023-03-13 NOTE — Telephone Encounter (Signed)
sent to GI they obtain [employer] auth [phone] 

## 2023-03-14 NOTE — Progress Notes (Deleted)
Cardiology Office Note:    Date:  03/14/2023   ID:  Carol Becker, DOB 1960/08/28, MRN [id number]  PCP:  Dorcas Carrow, DO   [facility] Providers Cardiologist:  None { Click to update primary MD,subspecialty MD or APP then REFRESH:1}    Referring MD: Dorcas Carrow, DO   No chief complaint on file. ***  History of Present Illness:    Carol Becker is a 62 y.o. female is seen at the request of Dr Laural Benes for evaluation of CAD. She has a history of HLD and prediabetes. She had a recent chest CT in May for cancer screening. This showed coronary and aortic calcification.   Past Medical History:  Diagnosis Date   Adrenal nodule (HCC)    Anxiety    Arthritis    knees, left thumb, shoulders   Breast cancer (HCC)    Left   Cervical spinal stenosis    Family history of breast cancer    GERD (gastroesophageal reflux disease)    History of hypothyroidism    Hyperlipidemia    poss history of   Hyperthyroidism    Hypothyroidism    Osteopenia    PONV (postoperative nausea and vomiting)    after wisdom teeth   Pre-diabetes    Restless leg syndrome    Sleep apnea    tested ~2012-" lost a lot of weight, no longer has sleep apnea."- per patient   Tremors of nervous system    medication related   Vertigo    Vestibular migraine     Past Surgical History:  Procedure Laterality Date   ANTERIOR / POSTERIOR COMBINED FUSION CERVICAL SPINE     BREAST BIOPSY Left 03/15/2021   Stereo bx, Ribbon Clip, path pending   BREAST LUMPECTOMY Left    CHOLECYSTECTOMY N/A 09/01/2022   Procedure: LAPAROSCOPIC CHOLECYSTECTOMY;  Surgeon: Fritzi Mandes, MD;  Location: [facility] OR;  Service: General;  Laterality: N/A;   COLONOSCOPY WITH PROPOFOL N/A 11/05/2020   Procedure: COLONOSCOPY WITH PROPOFOL;  Surgeon: Midge Minium, MD;  Location: [facility];  Service: Endoscopy;  Laterality: N/A;   CYSTECTOMY Left    KNEE ARTHROSCOPY WITH EXCISION BAKER'S CYST Left    LAPAROSCOPY      Exploratory   POLYPECTOMY  11/05/2020   Procedure: POLYPECTOMY;  Surgeon: Midge Minium, MD;  Location: [facility];  Service: Endoscopy;;   SHOULDER ARTHROSCOPY WITH ROTATOR CUFF REPAIR Left 12/01/2021   Procedure: Left shoulder arthroscopy, debridement, subacromial decompression, distal clavicle resection, chondroplasty;  Surgeon: Francena Hanly, MD;  Location: [facility];  Service: Orthopedics;  Laterality: Left;    TOTAL MASTECTOMY Bilateral    Per patient "Tummy tissue reconstructive surgery" same time   TOTAL SHOULDER ARTHROPLASTY Left 03/16/2022   Procedure: TOTAL SHOULDER ARTHROPLASTY;  Surgeon: Francena Hanly, MD;  Location: [facility];  Service: Orthopedics;  Laterality: Left;   trapeziectomy Left    done at [facility] 06/16/20   TUBAL LIGATION     WISDOM TOOTH EXTRACTION      Current Medications: No outpatient medications have been marked as taking for the 03/20/23 encounter (Appointment) with [place], Alda Gaultney M, MD.     Allergies:   Crestor [rosuvastatin calcium], Sulfa antibiotics, and Zithromax [azithromycin]   Social History   Socioeconomic History   Marital status: Married    Spouse name: Not on file   Number of children: Not on file   Years of education: Not on file   Highest education level: 12th grade  Occupational  History   Not on file  Tobacco Use   Smoking status: Former    Current packs/day: 0.00    Types: Cigarettes    Start date: 04/05/1981    Quit date: 04/06/2011    Years since quitting: 11.9   Smokeless tobacco: Never  Vaping Use   Vaping status: Former   Quit date: 04/05/2013  Substance and Sexual Activity   Alcohol use: Not Currently    Comment: On Occasion   Drug use: No   Sexual activity: Yes    Birth control/protection: Post-menopausal  Other Topics Concern   Not on file  Social History Narrative   Lives in [place] with husband; quit smoking- 30 years; no alcohol abuse. 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]   Social Determinants of Corporate investment banker Strain: Low Risk  (11/27/2022)   Overall Financial Resource Strain (CARDIA)    Difficulty of Paying Living Expenses: Not hard at all  Food Insecurity: No Food Insecurity (11/27/2022)   Hunger Vital Sign    Worried About Running Out of Food in the Last Year: Never true    Ran Out of Food in the Last Year: Never true  Transportation Needs: No Transportation Needs (11/27/2022)   PRAPARE - Administrator, Civil Service (Medical): No    Lack of Transportation (Non-Medical): No  Physical Activity: Unknown (11/27/2022)   Exercise Vital Sign    Days of Exercise per Week: Patient declined    Minutes of Exercise per Session: Not on file  Stress: No Stress Concern Present (11/27/2022)   [employer] of Occupational Health - Occupational Stress Questionnaire    Feeling of Stress : Not at all  Social Connections: Unknown (11/27/2022)   Social Connection and Isolation Panel [NHANES]    Frequency of Communication with Friends and Family: More than three times a week    Frequency of Social Gatherings with Friends and Family: Once a week    Attends Religious Services: Patient declined    Database administrator or Organizations: No    Attends Engineer, structural: Not on file    Marital Status: Married     Family History: The patient's ***family history includes Alzheimer's disease in her maternal grandfather; Bipolar disorder in her daughter; Breast cancer in her cousin; Breast cancer (age of onset: 63) in her paternal grandmother; Breast cancer (age of onset: 79) in her maternal grandmother; Cancer in her maternal grandmother and paternal grandmother; Heart disease in her cousin and father; Heart disease (age of onset: 92) in her sister; Parkinson's disease in her sister.  ROS:   Please see the history of present illness.    *** All other systems reviewed and are negative.  EKGs/Labs/Other Studies Reviewed:     The following studies were reviewed today: ***      Recent Labs: 02/05/2023: TSH 0.04 03/13/2023: ALT 14; BUN 12; Creatinine, Ser 0.87; Hemoglobin 12.6; Platelets 318; Potassium 4.3; Sodium 139  Recent Lipid Panel    Component Value Date/Time   CHOL 200 (H) 08/31/2022 1518   CHOL 155 06/13/2016 0817   TRIG 139 08/31/2022 1518   TRIG 155 (H) 06/13/2016 0817   HDL 51 08/31/2022 1518   VLDL 31 (H) 06/13/2016 0817   LDLCALC 124 (H) 08/31/2022 1518     Risk Assessment/Calculations:   {Does this patient have ATRIAL FIBRILLATION?:[phone]}            Physical Exam:    VS:  There were  no vitals taken for this visit.    Wt Readings from Last 3 Encounters:  03/13/23 196 lb 12.8 oz (89.3 kg)  12/08/22 198 lb (89.8 kg)  12/01/22 199 lb (90.3 kg)     GEN: *** Well nourished, well developed in no acute distress HEENT: Normal NECK: No JVD; No carotid bruits LYMPHATICS: No lymphadenopathy CARDIAC: ***RRR, no murmurs, rubs, gallops RESPIRATORY:  Clear to auscultation without rales, wheezing or rhonchi  ABDOMEN: Soft, non-tender, non-distended MUSCULOSKELETAL:  No edema; No deformity  SKIN: Warm and dry NEUROLOGIC:  Alert and oriented x 3 PSYCHIATRIC:  Normal affect   ASSESSMENT:    No diagnosis found. PLAN:    In order of problems listed above:  ***      {Are you ordering a CV Procedure (e.g. stress test, cath, DCCV, TEE, etc)?   Press F2        :[id number]}    Medication Adjustments/Labs and Tests Ordered: Current medicines are reviewed at length with the patient today.  Concerns regarding medicines are outlined above.  No orders of the defined types were placed in this encounter.  No orders of the defined types were placed in this encounter.   There are no Patient Instructions on file for this visit.   Signed, Iyanah Demont [place], MD  03/14/2023 7:38 AM    [facility]

## 2023-03-18 IMAGING — MG MM BREAST BX W LOC DEV 1ST LESION IMAGE BX SPEC STEREO GUIDE*L*
6 series · 6 of 6 positions shown · non-contrast
Comparison: Previous exams.
COMPARISON: Previous exams.

Addendum:
CLINICAL DATA: Grouped calcifications in the lower outer left
breast.

EXAM:
LEFT BREAST STEREOTACTIC CORE NEEDLE BIOPSY

[L (1 of 6)]
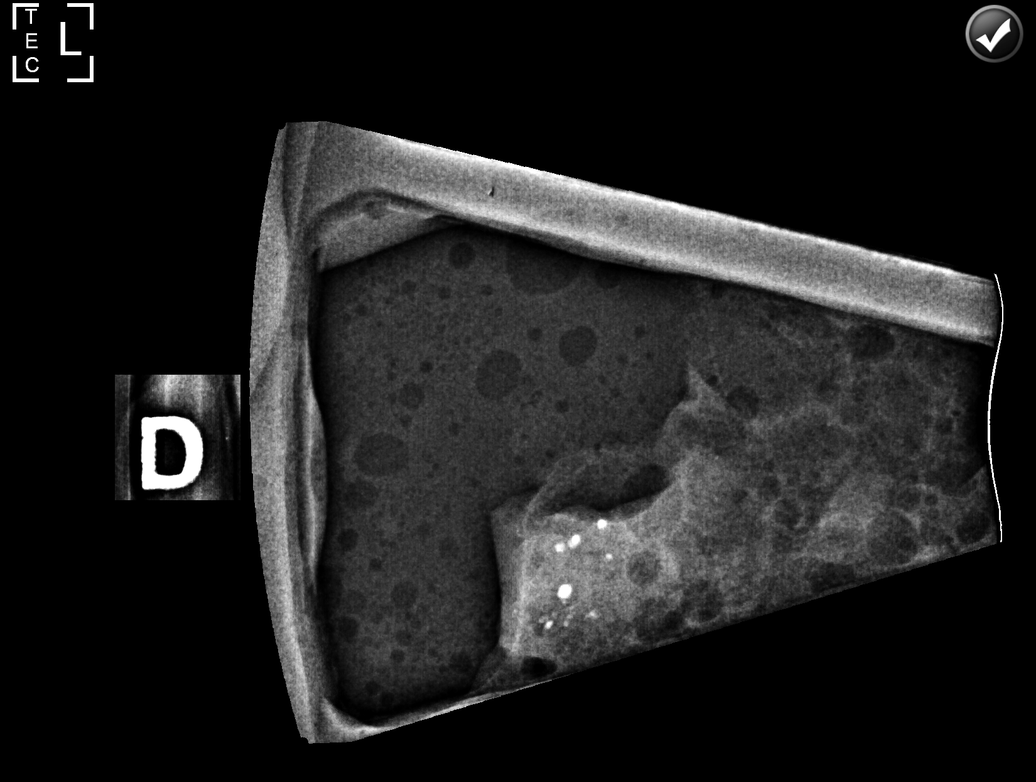

[L (2 of 6)]
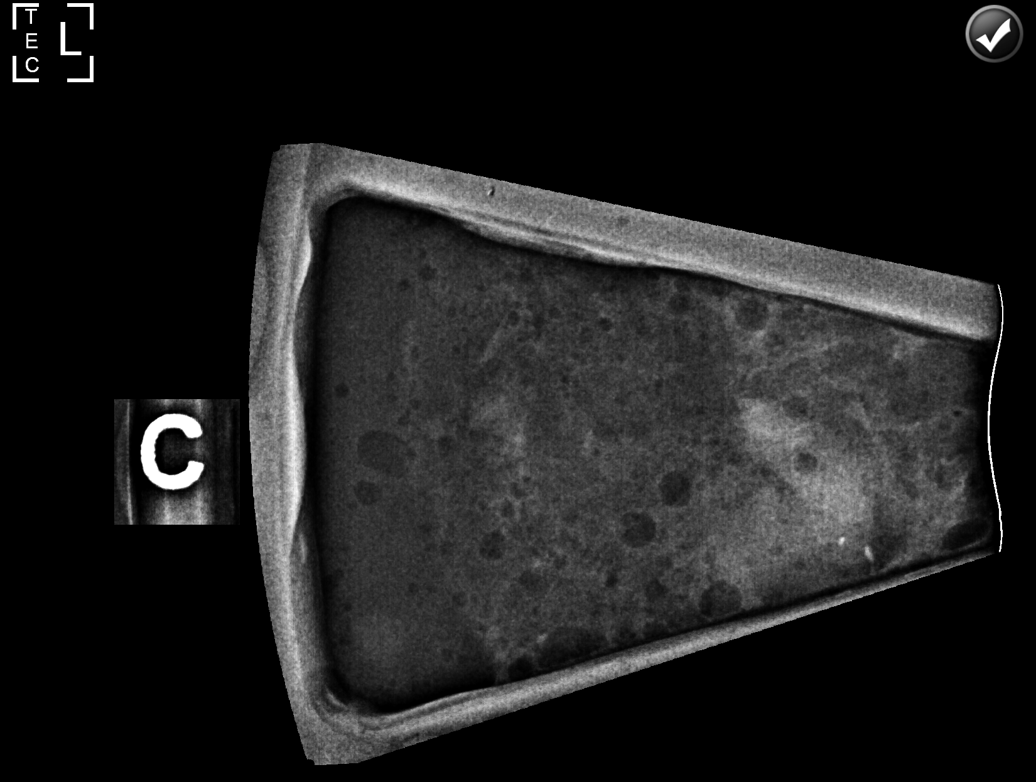

[L (3 of 6)]
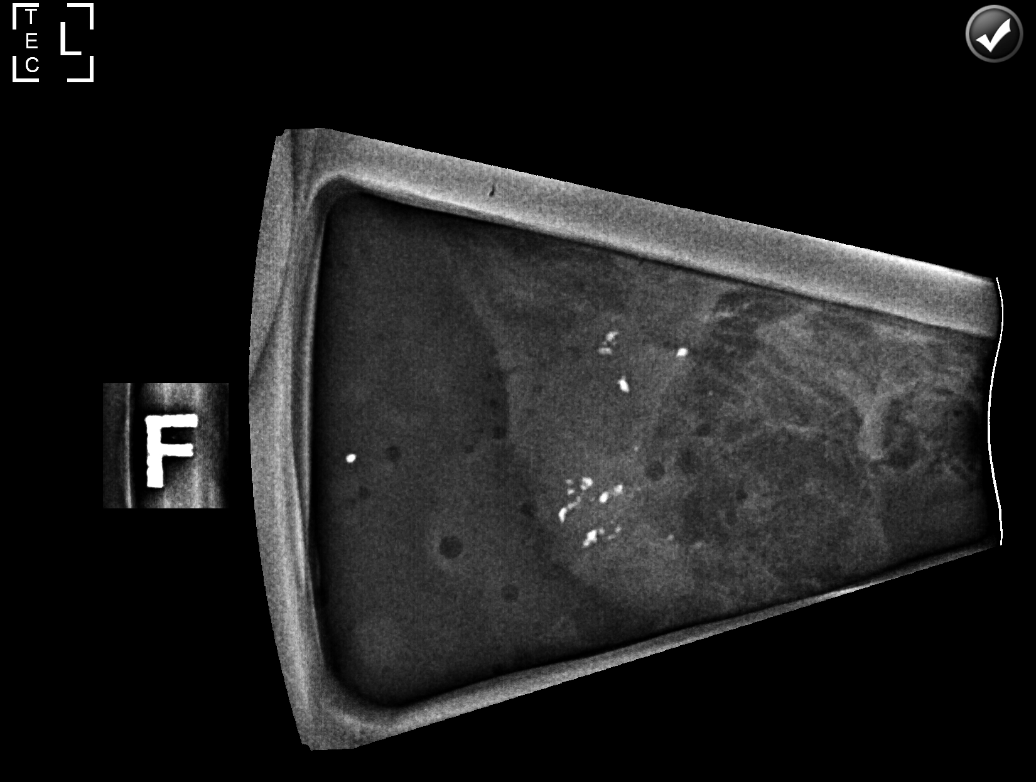

[L (4 of 6)]
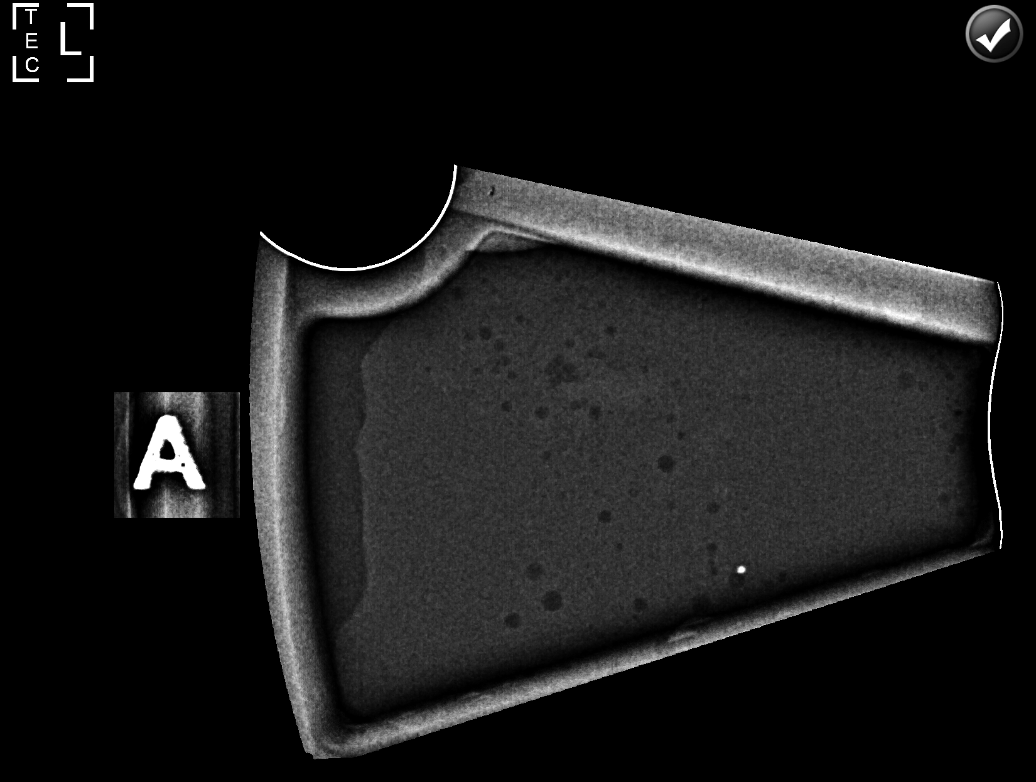

[L (5 of 6)]
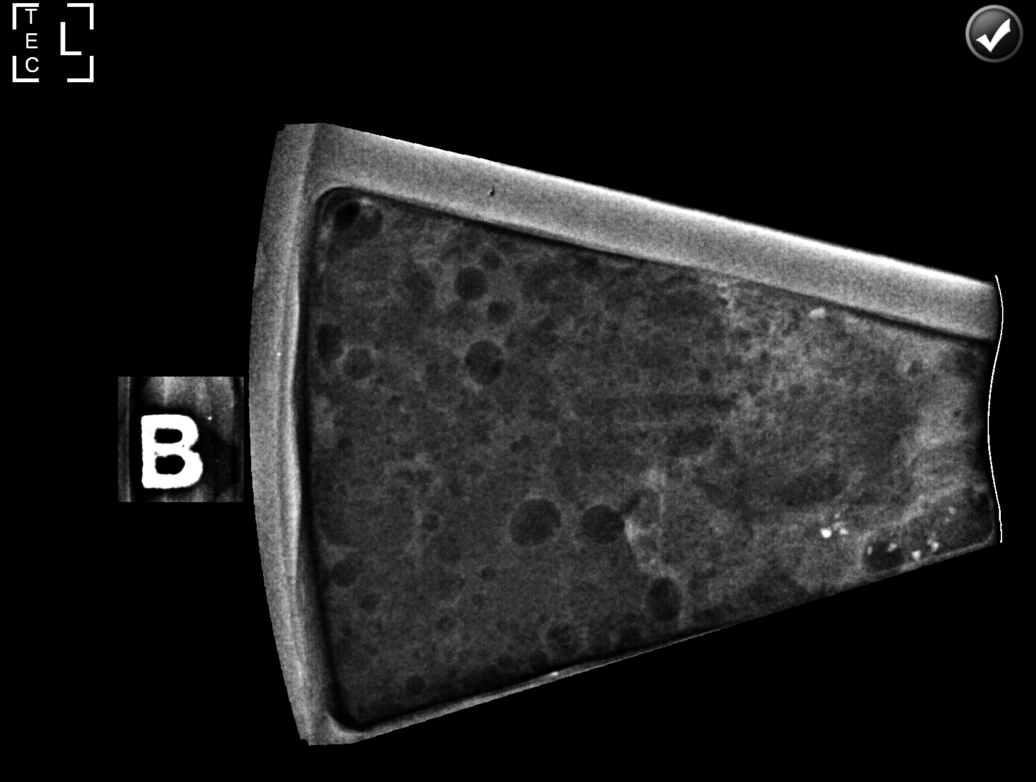

[L (6 of 6)]
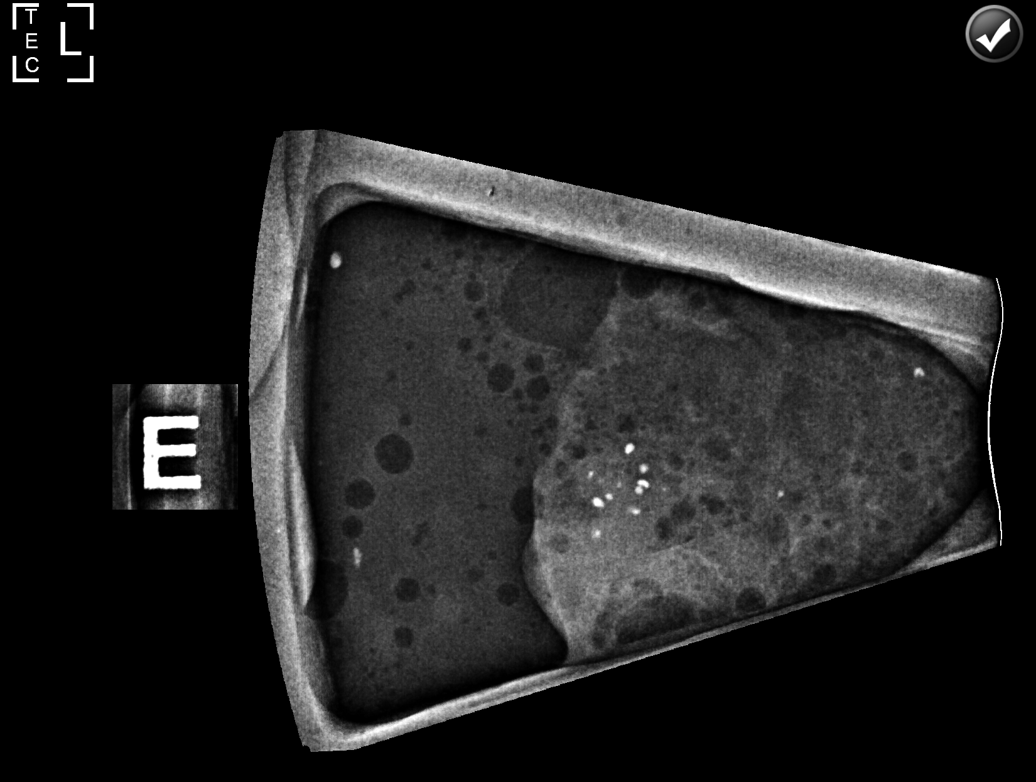

[6 of 6 positions shown; findings below may reference images not displayed]



Using sterile technique and 1% Lidocaine as local anesthetic, under
stereotactic guidance, a 9 gauge vacuum assisted device was used to
perform core needle biopsy of calcifications in the lower outer
quadrant of the left breast using a lateral approach. Specimen
radiograph was performed showing representative calcifications.
Specimens with calcifications are identified for pathology.

Lesion quadrant: Lower outer quadrant

At the conclusion of the procedure, a ribbon shaped tissue marker
clip was deployed into the biopsy cavity. Follow-up 2-view mammogram
was performed and dictated separately.
IMPRESSION: Stereotactic-guided biopsy of calcifications in the lower outer left
breast. No apparent complications.

ADDENDUM:
PATHOLOGY revealed: A. BREAST, LEFT LOWER OUTER QUADRANT;
STEREOTACTIC CORE NEEDLE BIOPSY: - DUCTAL CARCINOMA IN SITU,
HIGH-GRADE, WITH COMEDONECROSIS AND ASSOCIATED CALCIFICATIONS. -
NEGATIVE FOR INVASIVE CARCINOMA. Comment: Intact DCIS is identified
in [DATE] sampled tissue blocks, measuring up to 9 mm in
greatest linear extent. Immunohistochemical studies directed against
myoepithelial markers were performed to better clarify a focal area
initially thought suspicious for microinvasion. These stains
demonstrate an intact myoepithelial layer in this region, indicating
carcinoma is still in situ.

Pathology results are CONCORDANT with imaging findings, per Dr.
Jarell Gautam.

Pathology results and recommendations were discussed with patient
via telephone on 03/17/2021. Patient reported doing well after the
biopsy with no adverse symptoms, and only slight tenderness at the
site. Post biopsy care instructions were reviewed, questions were
answered and my direct phone number was provided. Patient was
instructed to call [HOSPITAL] for any additional
questions or concerns related to biopsy site.

RECOMMENDATIONS: 1. Surgical consultation. Request for surgical
consultation relayed to Juhani Jussi Felin RN and Rastio Forevar RN at
[HOSPITAL] [HOSPITAL] by Sofie Mah RN on 03/17/2021.

2.  Consider bilateral breast MRI given high grade DCIS.

Pathology results reported by Sofie Mah RN on 03/18/2021.



Using sterile technique and 1% Lidocaine as local anesthetic, under
stereotactic guidance, a 9 gauge vacuum assisted device was used to
perform core needle biopsy of calcifications in the lower outer
quadrant of the left breast using a lateral approach. Specimen
radiograph was performed showing representative calcifications.
Specimens with calcifications are identified for pathology.

Lesion quadrant: Lower outer quadrant

At the conclusion of the procedure, a ribbon shaped tissue marker
clip was deployed into the biopsy cavity. Follow-up 2-view mammogram
was performed and dictated separately.
IMPRESSION: Stereotactic-guided biopsy of calcifications in the lower outer left
breast. No apparent complications.

## 2023-03-20 ENCOUNTER — Ambulatory Visit: Payer: Managed Care, Other (non HMO) | Admitting: Cardiology

## 2023-03-21 LAB — COMPREHENSIVE METABOLIC PANEL
ALT: 14 IU/L (ref 0–32)
AST: 21 IU/L (ref 0–40)
Albumin: 4.2 g/dL (ref 3.9–4.9)
Alkaline Phosphatase: 121 IU/L (ref 44–121)
BUN/Creatinine Ratio: 14 (ref 12–28)
BUN: 12 mg/dL (ref 8–27)
Bilirubin Total: <0.2 mg/dL (ref 0.0–1.2)
CO2: 26 mmol/L (ref 20–29)
Calcium: 9.5 mg/dL (ref 8.7–10.3)
Chloride: 101 mmol/L (ref 96–106)
Creatinine, Ser: 0.87 mg/dL (ref 0.57–1.00)
Globulin, Total: 2.7 g/dL (ref 1.5–4.5)
Glucose: 86 mg/dL (ref 70–99)
Potassium: 4.3 mmol/L (ref 3.5–5.2)
Sodium: 139 mmol/L (ref 134–144)
Total Protein: 6.9 g/dL (ref 6.0–8.5)
eGFR: 76 mL/min/{1.73_m2} (ref >59)

## 2023-03-21 LAB — ATN PROFILE
A -- Beta-amyloid 42/40 Ratio: 0.127 (ref >0.102)
Beta-amyloid 40: 194.74 pg/mL
Beta-amyloid 42: 24.64 pg/mL
N -- NfL, Plasma: 2.05 pg/mL (ref 0.00–4.61)
T -- p-tau181: 0.8 pg/mL (ref 0.00–0.97)

## 2023-03-21 LAB — CBC WITH DIFFERENTIAL/PLATELET
Basophils Absolute: 0 10*3/uL (ref 0.0–0.2)
Basos: 1 %
EOS (ABSOLUTE): 0.1 10*3/uL (ref 0.0–0.4)
Eos: 2 %
Hematocrit: 40.1 % (ref 34.0–46.6)
Hemoglobin: 12.6 g/dL (ref 11.1–15.9)
Immature Grans (Abs): 0 10*3/uL (ref 0.0–0.1)
Immature Granulocytes: 0 %
Lymphocytes Absolute: 1.9 10*3/uL (ref 0.7–3.1)
Lymphs: 30 %
MCH: 28.6 pg (ref 26.6–33.0)
MCHC: 31.4 g/dL — ABNORMAL LOW (ref 31.5–35.7)
MCV: 91 fL (ref 79–97)
Monocytes Absolute: 0.5 10*3/uL (ref 0.1–0.9)
Monocytes: 8 %
Neutrophils Absolute: 3.8 10*3/uL (ref 1.4–7.0)
Neutrophils: 59 %
Platelets: 318 10*3/uL (ref 150–450)
RBC: 4.4 x10E6/uL (ref 3.77–5.28)
RDW: 13.4 % (ref 11.7–15.4)
WBC: 6.4 10*3/uL (ref 3.4–10.8)

## 2023-03-21 LAB — APOE ALZHEIMER'S RISK

## 2023-03-26 ENCOUNTER — Ambulatory Visit: Payer: Managed Care, Other (non HMO) | Admitting: Family Medicine

## 2023-03-28 ENCOUNTER — Other Ambulatory Visit: Payer: Managed Care, Other (non HMO)

## 2023-04-12 ENCOUNTER — Ambulatory Visit
Admission: RE | Admit: 2023-04-12 | Discharge: 2023-04-12 | Disposition: A | Discharge status: Home / Self Care | Payer: Managed Care, Other (non HMO) | Source: Ambulatory Visit | Attending: Family Medicine | Admitting: Family Medicine

## 2023-04-12 ENCOUNTER — Ambulatory Visit
Admission: RE | Admit: 2023-04-12 | Discharge: 2023-04-12 | Disposition: A | Discharge status: Home / Self Care | Payer: Managed Care, Other (non HMO) | Attending: Family Medicine | Admitting: Family Medicine

## 2023-04-12 ENCOUNTER — Ambulatory Visit: Payer: Managed Care, Other (non HMO) | Admitting: Family Medicine

## 2023-04-12 ENCOUNTER — Encounter: Payer: Self-pay | Admitting: Family Medicine

## 2023-04-12 VITALS — BP 136/74 | HR 80 | Ht 64.0 in | Wt 196.0 lb

## 2023-04-12 DIAGNOSIS — M25512 Pain in left shoulder: Secondary | ICD-10-CM

## 2023-04-12 DIAGNOSIS — E782 Mixed hyperlipidemia: Secondary | ICD-10-CM | POA: Diagnosis not present

## 2023-04-12 DIAGNOSIS — R1031 Right lower quadrant pain: Secondary | ICD-10-CM | POA: Diagnosis present

## 2023-04-12 DIAGNOSIS — I700 Atherosclerosis of aorta: Secondary | ICD-10-CM

## 2023-04-12 DIAGNOSIS — E0590 Thyrotoxicosis, unspecified without thyrotoxic crisis or storm: Secondary | ICD-10-CM | POA: Diagnosis not present

## 2023-04-12 DIAGNOSIS — G8929 Other chronic pain: Secondary | ICD-10-CM

## 2023-04-12 DIAGNOSIS — E1149 Type 2 diabetes mellitus with other diabetic neurological complication: Secondary | ICD-10-CM | POA: Diagnosis not present

## 2023-04-12 DIAGNOSIS — I2510 Atherosclerotic heart disease of native coronary artery without angina pectoris: Secondary | ICD-10-CM

## 2023-04-12 DIAGNOSIS — J439 Emphysema, unspecified: Secondary | ICD-10-CM

## 2023-04-12 DIAGNOSIS — Z23 Encounter for immunization: Secondary | ICD-10-CM | POA: Diagnosis not present

## 2023-04-12 DIAGNOSIS — R413 Other amnesia: Secondary | ICD-10-CM

## 2023-04-12 DIAGNOSIS — Z7985 Long-term (current) use of injectable non-insulin antidiabetic drugs: Secondary | ICD-10-CM

## 2023-04-12 MED ORDER — NORTRIPTYLINE HCL 50 MG PO CAPS: 50.0000 mg | ORAL_CAPSULE | Freq: Every day | ORAL | 0 refills | Status: DC

## 2023-04-12 MED ORDER — VENLAFAXINE HCL ER 150 MG PO CP24: 150.0000 mg | ORAL_CAPSULE | Freq: Every day | ORAL | 1 refills | Status: DC

## 2023-04-12 MED ORDER — MELOXICAM 15 MG PO TABS: 15.0000 mg | ORAL_TABLET | Freq: Every day | ORAL | 1 refills | Status: DC

## 2023-04-12 MED ORDER — SEMAGLUTIDE (2 MG/DOSE) 8 MG/3ML ~~LOC~~ SOPN: 2.0000 mg | PEN_INJECTOR | SUBCUTANEOUS | 1 refills | Status: DC

## 2023-04-12 MED ORDER — GABAPENTIN 800 MG PO TABS: ORAL_TABLET | ORAL | 1 refills | Status: DC

## 2023-04-12 NOTE — Assessment & Plan Note (Signed)
Will keep BP, cholesterol, and sugars under good control. Continue to monitor. Call with any concerns.

## 2023-04-12 NOTE — Assessment & Plan Note (Signed)
Rechecking labs today. Await results.  

## 2023-04-12 NOTE — Assessment & Plan Note (Signed)
Rechecking labs today. Await results. Treat as needed.  °

## 2023-04-12 NOTE — Assessment & Plan Note (Signed)
Continue to follow with endocrinology. Call with any concerns. Labs drawn today.

## 2023-04-12 NOTE — Assessment & Plan Note (Signed)
Under good control on current regimen. Continue current regimen. Continue to monitor. Call with any concerns. Refills given. Labs drawn today.   

## 2023-04-12 NOTE — Progress Notes (Signed)
BP 136/74   Pulse 80   Ht 5\' 4"  (1.626 m)   Wt 196 lb (88.9 kg)   SpO2 98%   BMI 33.64 kg/m    Subjective:    Patient ID: Carol Becker, female    DOB: 1960/07/29, 62 y.o.   MRN: [id number]  HPI: Carol Becker is a 62 y.o. female  Chief Complaint  Patient presents with   Hypertension   Diabetes   Hyperlipidemia   HIP PAIN Duration: 2 weeks Involved hip: R hip  Mechanism of injury: unknown Location: anterior Onset: sudden  Severity: severe  Quality: aching Frequency: constant Radiation: into the front of the leg Aggravating factors: walking   Alleviating factors: rest, tylenol  Status: stable Treatments attempted: tylenol   Relief with NSAIDs?: mild Weakness with weight bearing: no Weakness with walking: no Paresthesias / decreased sensation: no Swelling: no Redness:no Fevers: no  DIABETES Hypoglycemic episodes:no Polydipsia/polyuria: no Visual disturbance: no Chest pain: no Paresthesias: no Glucose Monitoring: yes  Accucheck frequency: Daily Taking Insulin?: no Blood Pressure Monitoring: not checking Retinal Examination: Up to Date Foot Exam: Up to Date Diabetic Education: Completed Pneumovax: Up to Date Influenza: Up to Date Aspirin: yes  HYPERLIPIDEMIA Hyperlipidemia status: excellent compliance Satisfied with current treatment?  yes Side effects:  yes Medication compliance: excellent compliance Past cholesterol meds: none Supplements: none Aspirin:  no The 10-year ASCVD risk score (Arnett DK, et al., 2019) is: 8.2%   Values used to calculate the score:     Age: 58 years     Sex: Female     Is Non-Hispanic African American: No     Diabetic: Yes     Tobacco smoker: No     Systolic Blood Pressure: 136 mmHg     Is BP treated: No     HDL Cholesterol: 51 mg/dL     Total Cholesterol: 200 mg/dL Chest pain:  no  HYPERTHYROIDISM Thyroid control status:controlled Satisfied with current treatment? yes Medication side effects:  no Medication compliance: excellent compliance Recent dose adjustment:no Fatigue: no Cold intolerance: no Heat intolerance: no Weight gain: no Weight loss: no Constipation: no Diarrhea/loose stools: no Palpitations: no Lower extremity edema: no Anxiety/depressed mood: no    Relevant past medical, surgical, family and social history reviewed and updated as indicated. Interim medical history since our last visit reviewed. Allergies and medications reviewed and updated.  Review of Systems  Constitutional: Negative.   Respiratory: Negative.    Cardiovascular: Negative.   Musculoskeletal:  Positive for arthralgias, myalgias, neck pain and neck stiffness. Negative for back pain, gait problem and joint swelling.  Skin: Negative.   Neurological: Negative.   Psychiatric/Behavioral: Negative.      Per HPI unless specifically indicated above     Objective:    BP 136/74   Pulse 80   Ht 5\' 4"  (1.626 m)   Wt 196 lb (88.9 kg)   SpO2 98%   BMI 33.64 kg/m   Wt Readings from Last 3 Encounters:  04/12/23 196 lb (88.9 kg)  03/13/23 196 lb 12.8 oz (89.3 kg)  12/08/22 198 lb (89.8 kg)    Physical Exam Vitals and nursing note reviewed.  Constitutional:      General: She is not in acute distress.    Appearance: Normal appearance. She is not ill-appearing, toxic-appearing or diaphoretic.  HENT:     Head: Normocephalic and atraumatic.     Right Ear: External ear normal.     Left Ear: External ear normal.  Nose: Nose normal.     Mouth/Throat:     Mouth: Mucous membranes are moist.     Pharynx: Oropharynx is clear.  Eyes:     General: No scleral icterus.       Right eye: No discharge.        Left eye: No discharge.     Extraocular Movements: Extraocular movements intact.     Conjunctiva/sclera: Conjunctivae normal.     Pupils: Pupils are equal, round, and reactive to light.  Cardiovascular:     Rate and Rhythm: Normal rate and regular rhythm.     Pulses: Normal pulses.      Heart sounds: Normal heart sounds. No murmur heard.    No friction rub. No gallop.  Pulmonary:     Effort: Pulmonary effort is normal. No respiratory distress.     Breath sounds: Normal breath sounds. No stridor. No wheezing, rhonchi or rales.  Chest:     Chest wall: No tenderness.  Musculoskeletal:        General: Normal range of motion.     Cervical back: Normal range of motion and neck supple.  Skin:    General: Skin is warm and dry.     Capillary Refill: Capillary refill takes less than 2 seconds.     Coloration: Skin is not jaundiced or pale.     Findings: No bruising, erythema, lesion or rash.  Neurological:     General: No focal deficit present.     Mental Status: She is alert and oriented to person, place, and time. Mental status is at baseline.  Psychiatric:        Mood and Affect: Mood normal.        Behavior: Behavior normal.        Thought Content: Thought content normal.        Judgment: Judgment normal.     Results for orders placed or performed in visit on 03/13/23  ATN PROFILE  Result Value Ref Range   A -- Beta-amyloid 42/40 Ratio 0.127 >0.102   Beta-amyloid 42 24.64 pg/mL   Beta-amyloid 40 194.74 pg/mL   T -- p-[id number] 0.80 0.00 - 0.97 pg/mL   N -- NfL, Plasma 2.05 0.00 - 4.61 pg/mL   ATN SUMMARY Comment    Information: Comment   CMP  Result Value Ref Range   Glucose 86 70 - 99 mg/dL   BUN 12 8 - 27 mg/dL   Creatinine, Ser 1.61 0.57 - 1.00 mg/dL   eGFR 76 >09 UE/AVW/0.98   BUN/Creatinine Ratio 14 12 - 28   Sodium 139 134 - 144 mmol/L   Potassium 4.3 3.5 - 5.2 mmol/L   Chloride 101 96 - 106 mmol/L   CO2 26 20 - 29 mmol/L   Calcium 9.5 8.7 - 10.3 mg/dL   Total Protein 6.9 6.0 - 8.5 g/dL   Albumin 4.2 3.9 - 4.9 g/dL   Globulin, Total 2.7 1.5 - 4.5 g/dL   Bilirubin Total <1.1 0.0 - 1.2 mg/dL   Alkaline Phosphatase 121 44 - 121 IU/L   AST 21 0 - 40 IU/L   ALT 14 0 - 32 IU/L  CBC with Differential/Platelets  Result Value Ref Range   WBC 6.4 3.4 -  10.8 x10E3/uL   RBC 4.40 3.77 - 5.28 x10E6/uL   Hemoglobin 12.6 11.1 - 15.9 g/dL   Hematocrit 91.4 78.2 - 46.6 %   MCV 91 79 - 97 fL   MCH 28.6 26.6 - 33.0 pg   MCHC  31.4 (L) 31.5 - 35.7 g/dL   RDW 40.9 81.1 - 91.4 %   Platelets 318 150 - 450 x10E3/uL   Neutrophils 59 Not Estab. %   Lymphs 30 Not Estab. %   Monocytes 8 Not Estab. %   Eos 2 Not Estab. %   Basos 1 Not Estab. %   Neutrophils Absolute 3.8 1.4 - 7.0 x10E3/uL   Lymphocytes Absolute 1.9 0.7 - 3.1 x10E3/uL   Monocytes Absolute 0.5 0.1 - 0.9 x10E3/uL   EOS (ABSOLUTE) 0.1 0.0 - 0.4 x10E3/uL   Basophils Absolute 0.0 0.0 - 0.2 x10E3/uL   Immature Granulocytes 0 Not Estab. %   Immature Grans (Abs) 0.0 0.0 - 0.1 x10E3/uL  APOE Alzheimer's Risk  Result Value Ref Range   Methodology: Comment    APO E Genotyping Result: E3/E3    Interpretation: Comment    Comment: Comment       Assessment & Plan:   Problem List Items Addressed This Visit       Cardiovascular and Mediastinum   Aortic atherosclerosis (HCC)    Will keep BP, cholesterol, and sugars under good control. Continue to monitor. Call with any concerns.       Relevant Medications   aspirin EC 81 MG tablet   Other Relevant Orders   CBC with Differential/Platelet   Comprehensive metabolic panel   CAD (coronary artery disease)    Will keep BP, cholesterol, and sugars under good control. Continue to monitor. Call with any concerns.       Relevant Medications   aspirin EC 81 MG tablet   Other Relevant Orders   CBC with Differential/Platelet   Comprehensive metabolic panel     Respiratory   COPD (chronic obstructive pulmonary disease) (HCC)    Under good control on current regimen. Continue current regimen. Continue to monitor. Call with any concerns. Refills given. Labs drawn today.        Relevant Orders   CBC with Differential/Platelet   Comprehensive metabolic panel     Endocrine   Hyperthyroidism    Continue to follow with endocrinology. Call with  any concerns. Labs drawn today.      Relevant Orders   CBC with Differential/Platelet   Comprehensive metabolic panel   TSH   Type 2 diabetes mellitus with neurological complications (HCC)   Relevant Medications   aspirin EC 81 MG tablet   Semaglutide, 2 MG/DOSE, 8 MG/3ML SOPN   Other Relevant Orders   CBC with Differential/Platelet   Comprehensive metabolic panel   Hgb A1c w/o eAG     Other   Hyperlipidemia - Primary    Rechecking labs today. Await results. Treat as needed.       Relevant Medications   aspirin EC 81 MG tablet   Other Relevant Orders   CBC with Differential/Platelet   Comprehensive metabolic panel   Lipid Panel w/o Chol/HDL Ratio   Other Visit Diagnoses     Right inguinal pain       Concern for hip/lumbar issue. Will start stretches and check x-ray. Continue meloxicam. Await results.   Relevant Orders   DG Hip Unilat W OR W/O Pelvis 2-3 Views Right   DG Lumbar Spine Complete   CBC with Differential/Platelet   Comprehensive metabolic panel   Chronic left shoulder pain       Referral to PT placed today   Relevant Medications   aspirin EC 81 MG tablet   gabapentin (NEURONTIN) 800 MG tablet   meloxicam (MOBIC) 15 MG tablet  nortriptyline (PAMELOR) 50 MG capsule   venlafaxine XR (EFFEXOR-XR) 150 MG 24 hr capsule   Other Relevant Orders   Ambulatory referral to Physical Therapy   Memory loss       Following with neurology. They recommended neurocognitive testing- referral placed today. Will check B12 and vitamin D. Await results.   Relevant Orders   Ambulatory referral to Neuropsychology   B12   VITAMIN D 25 Hydroxy (Vit-D Deficiency, Fractures)   Needs flu shot       Flu shot given today.   Relevant Orders   Flu vaccine trivalent PF, 6mos and older(Flulaval,Afluria,Fluarix,Fluzone) (Completed)        Follow up plan: Return in about 6 months (around 10/11/2023) for physical.

## 2023-04-13 ENCOUNTER — Telehealth: Payer: Self-pay | Admitting: Family Medicine

## 2023-04-13 LAB — CBC WITH DIFFERENTIAL/PLATELET
Basophils Absolute: 0.1 10*3/uL (ref 0.0–0.2)
Basos: 1 %
EOS (ABSOLUTE): 0.2 10*3/uL (ref 0.0–0.4)
Eos: 3 %
Hematocrit: 36.7 % (ref 34.0–46.6)
Hemoglobin: 11.8 g/dL (ref 11.1–15.9)
Immature Grans (Abs): 0 10*3/uL (ref 0.0–0.1)
Immature Granulocytes: 0 %
Lymphocytes Absolute: 2 10*3/uL (ref 0.7–3.1)
Lymphs: 33 %
MCH: 29.1 pg (ref 26.6–33.0)
MCHC: 32.2 g/dL (ref 31.5–35.7)
MCV: 91 fL (ref 79–97)
Monocytes Absolute: 0.6 10*3/uL (ref 0.1–0.9)
Monocytes: 9 %
Neutrophils Absolute: 3.3 10*3/uL (ref 1.4–7.0)
Neutrophils: 54 %
Platelets: 296 10*3/uL (ref 150–450)
RBC: 4.05 x10E6/uL (ref 3.77–5.28)
RDW: 13.8 % (ref 11.7–15.4)
WBC: 6.1 10*3/uL (ref 3.4–10.8)

## 2023-04-13 LAB — COMPREHENSIVE METABOLIC PANEL
ALT: 15 [IU]/L (ref 0–32)
AST: 20 [IU]/L (ref 0–40)
Albumin: 4.3 g/dL (ref 3.9–4.9)
Alkaline Phosphatase: 117 [IU]/L (ref 44–121)
BUN/Creatinine Ratio: 13 (ref 12–28)
BUN: 12 mg/dL (ref 8–27)
Bilirubin Total: <0.2 mg/dL (ref 0.0–1.2)
CO2: 25 mmol/L (ref 20–29)
Calcium: 9.6 mg/dL (ref 8.7–10.3)
Chloride: 101 mmol/L (ref 96–106)
Creatinine, Ser: 0.92 mg/dL (ref 0.57–1.00)
Globulin, Total: 2.5 g/dL (ref 1.5–4.5)
Glucose: 88 mg/dL (ref 70–99)
Potassium: 4.3 mmol/L (ref 3.5–5.2)
Sodium: 137 mmol/L (ref 134–144)
Total Protein: 6.8 g/dL (ref 6.0–8.5)
eGFR: 71 mL/min/{1.73_m2} (ref >59)

## 2023-04-13 LAB — LIPID PANEL W/O CHOL/HDL RATIO
Cholesterol, Total: 217 mg/dL — ABNORMAL HIGH (ref 100–199)
HDL: 55 mg/dL (ref >39)
LDL Chol Calc (NIH): 133 mg/dL — ABNORMAL HIGH (ref 0–99)
Triglycerides: 164 mg/dL — ABNORMAL HIGH (ref 0–149)
VLDL Cholesterol Cal: 29 mg/dL (ref 5–40)

## 2023-04-13 LAB — HGB A1C W/O EAG: Hgb A1c MFr Bld: 5.8 % — ABNORMAL HIGH (ref 4.8–5.6)

## 2023-04-13 LAB — VITAMIN D 25 HYDROXY (VIT D DEFICIENCY, FRACTURES): Vit D, 25-Hydroxy: 45.1 ng/mL (ref 30.0–100.0)

## 2023-04-13 LAB — TSH: TSH: 0.898 u[IU]/mL (ref 0.450–4.500)

## 2023-04-13 LAB — VITAMIN B12: Vitamin B-12: 548 pg/mL (ref 232–1245)

## 2023-04-13 NOTE — Telephone Encounter (Signed)
Ms. Ruddell states she brought in MRI & [place] for Dr. Laural Benes to review. I am placing envelope in providers folder for review.

## 2023-04-18 ENCOUNTER — Encounter: Payer: Self-pay | Admitting: Family Medicine

## 2023-05-14 NOTE — Progress Notes (Unsigned)
Cardiology Office Note:    Date:  05/17/2023   ID:  Hoyle Sauer, DOB [birth year], MRN [id number]  PCP:  Dorcas Carrow, DO   [facility] Providers Cardiologist:  None     Referring MD: Dorcas Carrow, DO   Chief Complaint  Patient presents with   Coronary Artery Disease    History of Present Illness:    Carol Becker is a 62 y.o. female seen at the request of Dr Laural Benes for evaluation of CAD. She has a history of thyroid disease and HLD. In May of this year she had a chest CT done for cancer screening which showed coronary and aortic calcification.   On evaluation today she feels well. Denies any chest pain or SOB. Rarely gets a flutter sensation that lasts seconds. May feel heart pounding if does more strenuous activity. Is sedentary. Reports intolerance to lipitor and Crestor in the past- made her achy and wiped out. Reports ETT years ago was normal. Echo last year was normal.   Past Medical History:  Diagnosis Date   Adrenal nodule (HCC)    Anxiety    Arthritis    knees, left thumb, shoulders   Breast cancer (HCC)    Left   Cervical spinal stenosis    Family history of breast cancer    GERD (gastroesophageal reflux disease)    History of hypothyroidism    Hyperlipidemia    poss history of   Hyperthyroidism    Hypothyroidism    Osteopenia    PONV (postoperative nausea and vomiting)    after wisdom teeth   Pre-diabetes    Prediabetes    Restless leg syndrome    Sleep apnea    tested ~2012-" lost a lot of weight, no longer has sleep apnea."- per patient   Tremors of nervous system    medication related   Vertigo    Vestibular migraine     Past Surgical History:  Procedure Laterality Date   ANTERIOR / POSTERIOR COMBINED FUSION CERVICAL SPINE     BREAST BIOPSY Left 03/15/2021   Stereo bx, Ribbon Clip, path pending   BREAST LUMPECTOMY Left    CHOLECYSTECTOMY N/A 09/01/2022   Procedure: LAPAROSCOPIC CHOLECYSTECTOMY;  Surgeon: Fritzi Mandes, MD;  [facility]: [facility];  Service: General;  Laterality: N/A;   COLONOSCOPY WITH PROPOFOL N/A 11/05/2020   Procedure: COLONOSCOPY WITH PROPOFOL;  Surgeon: Midge Minium, MD;  Location: [facility];  Service: Endoscopy;  Laterality: N/A;   CYSTECTOMY Left    KNEE ARTHROSCOPY WITH EXCISION BAKER'S CYST Left    LAPAROSCOPY     Exploratory   POLYPECTOMY  11/05/2020   Procedure: POLYPECTOMY;  Surgeon: Midge Minium, MD;  Location: [facility];  Service: Endoscopy;;   SHOULDER ARTHROSCOPY WITH ROTATOR CUFF REPAIR Left 12/01/2021   Procedure: Left shoulder arthroscopy, debridement, subacromial decompression, distal clavicle resection, chondroplasty;  Surgeon: Francena Hanly, MD;  Location: [facility];  Service: Orthopedics;  Laterality: Left;    TOTAL MASTECTOMY Bilateral    Per patient "Tummy tissue reconstructive surgery" same time   TOTAL SHOULDER ARTHROPLASTY Left 03/16/2022   Procedure: TOTAL SHOULDER ARTHROPLASTY;  Surgeon: Francena Hanly, MD;  Location: [facility];  Service: Orthopedics;  Laterality: Left;   trapeziectomy Left    done at [facility] 06/16/20   TUBAL LIGATION     WISDOM TOOTH EXTRACTION      Current Medications: Current Meds  Medication Sig   aspirin EC 81 MG tablet Take 81 mg by  mouth daily. Swallow whole.   ezetimibe (ZETIA) 10 MG tablet Take 1 tablet (10 mg total) by mouth daily.   gabapentin (NEURONTIN) 800 MG tablet Take 1 pill in the AM and 2 pills in the PM   meloxicam (MOBIC) 15 MG tablet Take 1 tablet (15 mg total) by mouth daily.   methimazole (TAPAZOLE) 5 MG tablet Take 1.5 tablets (7.5 mg total) by mouth daily.   Multiple Vitamin (MULTIVITAMIN PO) Take by mouth.   nortriptyline (PAMELOR) 50 MG capsule Take 1 capsule (50 mg total) by mouth at bedtime.   omeprazole (PRILOSEC) 20 MG capsule Take 20 mg by mouth daily.   Semaglutide, 2 MG/DOSE, 8 MG/3ML SOPN Inject 2 mg as directed once a week.   venlafaxine XR (EFFEXOR-XR) 150 MG 24 hr capsule Take 1 capsule  (150 mg total) by mouth at bedtime.   [DISCONTINUED] ezetimibe (ZETIA) 10 MG tablet Take 1 tablet (10 mg total) by mouth daily.     Allergies:   Crestor [rosuvastatin calcium], Sulfa antibiotics, and Zithromax [azithromycin]   Social History   Socioeconomic History   Marital status: Married    Spouse name: Not on file   Number of children: 2   Years of education: Not on file   Highest education level: 12th grade  Occupational History   Not on file  Tobacco Use   Smoking status: Former    Current packs/day: 0.00    Types: Cigarettes    Start date: 04/05/1981    Quit date: 04/06/2011    Years since quitting: 12.1   Smokeless tobacco: Never  Vaping Use   Vaping status: Former   Quit date: 04/05/2013  Substance and Sexual Activity   Alcohol use: Not Currently    Comment: On Occasion   Drug use: No   Sexual activity: Yes    Birth control/protection: Post-menopausal  Other Topics Concern   Not on file  Social History Narrative   Lives in [place] with husband; quit smoking- 30 years; no alcohol abuse. 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]   Social Determinants of Corporate investment banker Strain: Low Risk  (11/27/2022)   Overall Financial Resource Strain (CARDIA)    Difficulty of Paying Living Expenses: Not hard at all  Food Insecurity: No Food Insecurity (11/27/2022)   Hunger Vital Sign    Worried About Running Out of Food in the Last Year: Never true    Ran Out of Food in the Last Year: Never true  Transportation Needs: No Transportation Needs (11/27/2022)   PRAPARE - Administrator, Civil Service (Medical): No    Lack of Transportation (Non-Medical): No  Physical Activity: Unknown (11/27/2022)   Exercise Vital Sign    Days of Exercise per Week: Patient declined    Minutes of Exercise per Session: Not on file  Stress: No Stress Concern Present (11/27/2022)   [employer] of Occupational Health - Occupational Stress  Questionnaire    Feeling of Stress : Not at all  Social Connections: Unknown (11/27/2022)   Social Connection and Isolation Panel [NHANES]    Frequency of Communication with Friends and Family: More than three times a week    Frequency of Social Gatherings with Friends and Family: Once a week    Attends Religious Services: Patient declined    Database administrator or Organizations: No    Attends Engineer, structural: Not on file    Marital Status: Married     Family History:  The patient's family history includes Alzheimer's disease in her maternal grandfather; Bipolar disorder in her daughter; Breast cancer in her cousin; Breast cancer (age of onset: 60) in her paternal grandmother; Breast cancer (age of onset: 55) in her maternal grandmother; Cancer in her maternal grandmother and paternal grandmother; Heart disease in her cousin and father; Heart disease (age of onset: 40) in her sister; Parkinson's disease in her sister.  ROS:   Please see the history of present illness.     All other systems reviewed and are negative.  EKGs/Labs/Other Studies Reviewed:    The following studies were reviewed today: Echo 08/20/21: INTERPRETATION ---------------------------------------------------------------    NORMAL LEFT VENTRICULAR SYSTOLIC FUNCTION WITH MILD LVH    NORMAL RIGHT VENTRICULAR SYSTOLIC FUNCTION    VALVULAR REGURGITATION: TRIVIAL MR, TRIVIAL PR, TRIVIAL TR    NO VALVULAR STENOSIS    NO PRIOR STUDY FOR COMPARISON  EKG Interpretation Date/Time:  Thursday May 17 2023 08:36:33 EST Ventricular Rate:  88 PR Interval:  152 QRS Duration:  76 QT Interval:  386 QTC Calculation: 467 R Axis:   16  Text Interpretation: Normal sinus rhythm Normal ECG When compared with ECG of 01-Sep-2022 08:54, No significant change was found Confirmed by [place], Carol Becker [phone]) on 05/17/2023 8:44:29 AM   EKG Interpretation Date/Time:  Thursday May 17 2023 08:36:33 EST Ventricular Rate:   88 PR Interval:  152 QRS Duration:  76 QT Interval:  386 QTC Calculation: 467 R Axis:   16  Text Interpretation: Normal sinus rhythm Normal ECG When compared with ECG of 01-Sep-2022 08:54, No significant change was found Confirmed by [place], Tallin Hart [phone]) on 05/17/2023 8:44:29 AM   Recent Labs: 04/12/2023: ALT 15; BUN 12; Creatinine, Ser 0.92; Hemoglobin 11.8; Platelets 296; Potassium 4.3; Sodium 137; TSH 0.898  Recent Lipid Panel    Component Value Date/Time   CHOL 217 (H) 04/12/2023 1416   CHOL 155 06/13/2016 0817   TRIG 164 (H) 04/12/2023 1416   TRIG 155 (H) 06/13/2016 0817   HDL 55 04/12/2023 1416   VLDL 31 (H) 06/13/2016 0817   LDLCALC 133 (H) 04/12/2023 1416     Risk Assessment/Calculations:                Physical Exam:    VS:  BP 126/83 (BP Location: Right Arm, Patient Position: Sitting, Cuff Size: Normal)   Pulse 88   Ht 5\' 4"  (1.626 m)   Wt 202 lb (91.6 kg)   SpO2 95%   BMI 34.67 kg/m     Wt Readings from Last 3 Encounters:  05/17/23 202 lb (91.6 kg)  04/12/23 196 lb (88.9 kg)  03/13/23 196 lb 12.8 oz (89.3 kg)     GEN:  Well nourished, well developed in no acute distress HEENT: Normal NECK: No JVD; No carotid bruits LYMPHATICS: No lymphadenopathy CARDIAC: RRR, no murmurs, rubs, gallops RESPIRATORY:  Clear to auscultation without rales, wheezing or rhonchi  ABDOMEN: Soft, non-tender, non-distended MUSCULOSKELETAL:  No edema; No deformity  SKIN: Warm and dry NEUROLOGIC:  Alert and oriented x 3 PSYCHIATRIC:  Normal affect   ASSESSMENT:    1. Coronary artery calcification seen on CAT scan   2. Aortic atherosclerosis (HCC)   3. Mixed hyperlipidemia    PLAN:    In order of problems listed above:  Coronary artery calcification noted on CT. Mild calcification at ostium of left main. Otherwise no significant calcification seen. She is asymptomatic. Focus at this time on risk factor modification. Encourage increase aerobic activity and  heart  healthy diet. Would like to get LDL < 70. Sugar is currently well controlled and BP is good. HLD - intolerant to statins. Will try Zetia 10 mg daily. Repeat lab in 3 months. If not at goal would recommend Repatha.  Aortic atherosclerosis- as above.            Medication Adjustments/Labs and Tests Ordered: Current medicines are reviewed at length with the patient today.  Concerns regarding medicines are outlined above.  Orders Placed This Encounter  Procedures   Lipid panel   Hepatic function panel   EKG 12-Lead   Meds ordered this encounter  Medications   DISCONTD: ezetimibe (ZETIA) 10 MG tablet    Sig: Take 1 tablet (10 mg total) by mouth daily.    Dispense:  90 tablet    Refill:  3   ezetimibe (ZETIA) 10 MG tablet    Sig: Take 1 tablet (10 mg total) by mouth daily.    Dispense:  90 tablet    Refill:  3    Patient Instructions  Medication Instructions:  Start Zetia 10 mg daily Continue all other medications *If you need a refill on your cardiac medications before your next appointment, please call your pharmacy*   Lab Work: Fasting lipid and hepatic panels to be done in 3 months   Testing/Procedures: None ordered   Follow-Up: At [facility], you and your health needs are our priority.  As part of our continuing mission to provide you with exceptional heart care, we have created designated Provider Care Teams.  These Care Teams include your primary Cardiologist (physician) and Advanced Practice Providers (APPs -  Physician Assistants and Nurse Practitioners) who all work together to provide you with the care you need, when you need it.  We recommend signing up for the patient portal called "MyChart".  Sign up information is provided on this After Visit Summary.  MyChart is used to connect with patients for Virtual Visits (Telemedicine).  Patients are able to view lab/test results, encounter notes, upcoming appointments, etc.  Non-urgent messages can be sent to  your provider as well.   To learn more about what you can do with MyChart, go to [email or web address].    Your next appointment:  1 year   Call in July to schedule Nov appointment     Provider:  Dr.Eleuterio Dollar     Signed, Nansi Birmingham [place], MD  05/17/2023 9:06 AM    [facility]

## 2023-05-17 ENCOUNTER — Encounter: Payer: Self-pay | Admitting: Cardiology

## 2023-05-17 ENCOUNTER — Ambulatory Visit: Payer: Managed Care, Other (non HMO) | Attending: Cardiology | Admitting: Cardiology

## 2023-05-17 VITALS — BP 126/83 | HR 88 | Ht 64.0 in | Wt 202.0 lb

## 2023-05-17 DIAGNOSIS — I700 Atherosclerosis of aorta: Secondary | ICD-10-CM | POA: Diagnosis not present

## 2023-05-17 DIAGNOSIS — I2510 Atherosclerotic heart disease of native coronary artery without angina pectoris: Secondary | ICD-10-CM | POA: Diagnosis not present

## 2023-05-17 DIAGNOSIS — E782 Mixed hyperlipidemia: Secondary | ICD-10-CM

## 2023-05-17 MED ORDER — EZETIMIBE 10 MG PO TABS: 10.0000 mg | ORAL_TABLET | Freq: Every day | ORAL | 3 refills | Status: DC

## 2023-05-17 NOTE — Patient Instructions (Signed)
Medication Instructions:  Start Zetia 10 mg daily Continue all other medications *If you need a refill on your cardiac medications before your next appointment, please call your pharmacy*   Lab Work: Fasting lipid and hepatic panels to be done in 3 months   Testing/Procedures: None ordered   Follow-Up: At [facility], you and your health needs are our priority.  As part of our continuing mission to provide you with exceptional heart care, we have created designated Provider Care Teams.  These Care Teams include your primary Cardiologist (physician) and Advanced Practice Providers (APPs -  Physician Assistants and Nurse Practitioners) who all work together to provide you with the care you need, when you need it.  We recommend signing up for the patient portal called "MyChart".  Sign up information is provided on this After Visit Summary.  MyChart is used to connect with patients for Virtual Visits (Telemedicine).  Patients are able to view lab/test results, encounter notes, upcoming appointments, etc.  Non-urgent messages can be sent to your provider as well.   To learn more about what you can do with MyChart, go to [email or web address].    Your next appointment:  1 year   Call in July to schedule Nov appointment     Provider:  Dr.Jordan

## 2023-06-13 ENCOUNTER — Encounter: Payer: Self-pay | Admitting: Internal Medicine

## 2023-06-13 ENCOUNTER — Ambulatory Visit: Payer: Managed Care, Other (non HMO) | Admitting: Internal Medicine

## 2023-06-13 VITALS — BP 136/80 | HR 96 | Ht 64.0 in | Wt 196.0 lb

## 2023-06-13 DIAGNOSIS — E0500 Thyrotoxicosis with diffuse goiter without thyrotoxic crisis or storm: Secondary | ICD-10-CM | POA: Diagnosis not present

## 2023-06-13 DIAGNOSIS — E0590 Thyrotoxicosis, unspecified without thyrotoxic crisis or storm: Secondary | ICD-10-CM

## 2023-06-13 DIAGNOSIS — D3502 Benign neoplasm of left adrenal gland: Secondary | ICD-10-CM

## 2023-06-13 MED ORDER — METHIMAZOLE 5 MG PO TABS: 7.5000 mg | ORAL_TABLET | Freq: Every day | ORAL | 2 refills | Status: DC

## 2023-06-13 NOTE — Progress Notes (Addendum)
Name: Carol Becker  MRN/ DOB: [id number], [birth year]    Age/ Sex: 62 y.o., female    PCP: Dorcas Carrow, DO   Reason for Endocrinology Evaluation: Hyperthyroidism     Date of Initial Endocrinology Evaluation: 06/13/2023     HPI: [name] is a 62 y.o. 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 The patient presented for initial endocrinology clinic visit on 06/13/2023 for consultative assistance with her Hyperthyroidism.   Patient has been diagnosed with hypothyroidism > 15 yrs ago , she was started on LT-for replacement 25 mcg daily and stayed on it for years.  She discontinued LT-for replacement approximately 2021 due to side effects.  By November, 2021 the patient has been noted with hyperthyroidism and was started on thionamides therapy.  No prior XRT Granddaughter with thyroid disease  TRAb elevated     ADRENAL HISTORY:  During evaluation for breast cancer in 03/2021 she was noted with adrenal gland adenoma on CT imaging.  CT imaging 08/2021 showed a 1.5 cm indeterminant left adrenal nodule.   Of note, the patient follows with neurology and has chronic vertigo/vestibular migraines  Patient transitioned care from [facility] endocrinology   SUBJECTIVE:    Today (06/13/23): Ms. [name] is here for follow-up on hyperthyroidism and adrenal adenoma.  Patient follows with [facility] oncology for left breast ductal carcinoma in situ  Denies local neck swelling  Weight fluctuates- she is on Ozempic  Denies tremors  Continue  occasional palpitations Denies eye symptoms  Continues with constipation but no diarrhea  Bp well controlled  Has weakness in the upper extremities, has left shoulder injury 2021 , s/p replacement  Has also right hip/groin pain  NO biotin  She continues to complain of decreased memory, had brought this to her  neurologist    Methimazole 5 mg, 1.5 tabs daily       HISTORY:  Past Medical History:  Past Medical History:  Diagnosis Date   Adrenal nodule (HCC)    Anxiety    Arthritis    knees, left thumb, shoulders   Breast cancer (HCC)    Left   Cervical spinal stenosis    Family history of breast cancer    GERD (gastroesophageal reflux disease)    History of hypothyroidism    Hyperlipidemia    poss history of   Hyperthyroidism    Hypothyroidism    Osteopenia    PONV (postoperative nausea and vomiting)    after wisdom teeth   Pre-diabetes    Prediabetes    Restless leg syndrome    Sleep apnea    tested ~2012-" lost a lot of weight, no longer has sleep apnea."- per patient   Tremors of nervous system    medication related   Vertigo    Vestibular migraine    Past Surgical History:  Past Surgical History:  Procedure Laterality Date   ANTERIOR / POSTERIOR COMBINED FUSION CERVICAL SPINE     BREAST BIOPSY Left 03/15/2021   Stereo bx, Ribbon Clip, path pending   BREAST LUMPECTOMY Left    CHOLECYSTECTOMY N/A 09/01/2022   Procedure: LAPAROSCOPIC CHOLECYSTECTOMY;  Surgeon: Fritzi Mandes, MD;  Location: [facility];  Service: General;  Laterality: N/A;   COLONOSCOPY WITH PROPOFOL N/A 11/05/2020   Procedure: COLONOSCOPY WITH PROPOFOL;  Surgeon: Midge Minium, MD;  Location: [facility];  Service: Endoscopy;  Laterality: N/A;  CYSTECTOMY Left    KNEE ARTHROSCOPY WITH EXCISION BAKER'S CYST Left    LAPAROSCOPY     Exploratory   POLYPECTOMY  11/05/2020   Procedure: POLYPECTOMY;  Surgeon: Midge Minium, MD;  Location: [facility];  Service: Endoscopy;;   SHOULDER ARTHROSCOPY WITH ROTATOR CUFF REPAIR Left 12/01/2021   Procedure: Left shoulder arthroscopy, debridement, subacromial decompression, distal clavicle resection, chondroplasty;  Surgeon: Francena Hanly, MD;  Location: [facility];  Service: Orthopedics;  Laterality: Left;    TOTAL MASTECTOMY Bilateral    Per  patient "Tummy tissue reconstructive surgery" same time   TOTAL SHOULDER ARTHROPLASTY Left 03/16/2022   Procedure: TOTAL SHOULDER ARTHROPLASTY;  Surgeon: Francena Hanly, MD;  Location: [facility];  Service: Orthopedics;  Laterality: Left;   trapeziectomy Left    done at [facility] 06/16/20   TUBAL LIGATION     WISDOM TOOTH EXTRACTION      Social History:  reports that she quit smoking about 12 years ago. Her smoking use included cigarettes. She started smoking about 42 years ago. She has never used smokeless tobacco. She reports that she does not currently use alcohol. She reports that she does not use drugs. Family History: family history includes Alzheimer's disease in her maternal grandfather; Bipolar disorder in her daughter; Breast cancer in her cousin; Breast cancer (age of onset: 46) in her paternal grandmother; Breast cancer (age of onset: 35) in her maternal grandmother; Cancer in her maternal grandmother and paternal grandmother; Heart disease in her cousin and father; Heart disease (age of onset: 79) in her sister; Parkinson's disease in her sister.   HOME MEDICATIONS: Allergies as of 06/13/2023       Reactions   Crestor [rosuvastatin Calcium] Other (See Comments)   Indigestion   Sulfa Antibiotics Nausea Only   Zithromax [azithromycin] Other (See Comments)   Not effective        Medication List        Accurate as of June 13, 2023  8:04 AM. If you have any questions, ask your nurse or doctor.          aspirin EC 81 MG tablet Take 81 mg by mouth daily. Swallow whole.   ezetimibe 10 MG tablet Commonly known as: ZETIA Take 1 tablet (10 mg total) by mouth daily.   gabapentin 800 MG tablet Commonly known as: NEURONTIN Take 1 pill in the AM and 2 pills in the PM   meloxicam 15 MG tablet Commonly known as: MOBIC Take 1 tablet (15 mg total) by mouth daily.   methimazole 5 MG tablet Commonly known as: TAPAZOLE Take 1.5 tablets (7.5 mg total) by mouth daily.   MULTIVITAMIN  PO Take by mouth.   nortriptyline 50 MG capsule Commonly known as: PAMELOR Take 1 capsule (50 mg total) by mouth at bedtime.   omeprazole 20 MG capsule Commonly known as: PRILOSEC Take 20 mg by mouth daily.   Semaglutide (2 MG/DOSE) 8 MG/3ML Sopn Inject 2 mg as directed once a week.   venlafaxine XR 150 MG 24 hr capsule Commonly known as: EFFEXOR-XR Take 1 capsule (150 mg total) by mouth at bedtime.          REVIEW OF SYSTEMS: A comprehensive ROS was conducted with the patient and is negative except as per HPI     OBJECTIVE:  VS: BP 136/80 (BP Location: Right Arm, Patient Position: Sitting, Cuff Size: Large)   Pulse 96   Ht 5\' 4"  (1.626 m)   Wt 196 lb (88.9 kg)  SpO2 98%   BMI 33.64 kg/m    Wt Readings from Last 3 Encounters:  06/13/23 196 lb (88.9 kg)  05/17/23 202 lb (91.6 kg)  04/12/23 196 lb (88.9 kg)     EXAM: General: Pt appears well and is in NAD  Neck: General: Supple without adenopathy. Thyroid: Thyroid size normal.  No goiter or nodules appreciated.  Lungs: Clear with good BS bilat   Heart: Auscultation: RRR.  Abdomen: soft, nontender  Extremities:  BL LE: No pretibial edema   Mental Status: Judgment, insight: Intact Orientation: Oriented to time, place, and person Mood and affect: No depression, anxiety, or agitation     DATA REVIEWED:   Latest Reference Range & Units 04/12/23 14:16  TSH 0.450 - 4.500 uIU/mL 0.898      Latest Reference Range & Units 04/12/23 14:16  Sodium 134 - 144 mmol/L 137  Potassium 3.5 - 5.2 mmol/L 4.3  Chloride 96 - 106 mmol/L 101  CO2 20 - 29 mmol/L 25  Glucose 70 - 99 mg/dL 88  BUN 8 - 27 mg/dL 12  Creatinine 1.61 - 0.96 mg/dL 0.45  Calcium 8.7 - 40.9 mg/dL 9.6  BUN/Creatinine Ratio 12 - 28  13  eGFR >59 mL/min/1.73 71  Alkaline Phosphatase 44 - 121 IU/L 117  Albumin 3.9 - 4.9 g/dL 4.3  AST 0 - 40 IU/L 20  ALT 0 - 32 IU/L 15  Total Protein 6.0 - 8.5 g/dL 6.8  Total Bilirubin 0.0 - 1.2 mg/dL <8.1      Old records , labs and images have been reviewed.    ASSESSMENT/PLAN/RECOMMENDATIONS:   Hyperthyroidism:  -Secondary to Graves' disease - Pt is clinically euthyroid  -No local neck swelling  -I have attempted to decrease methimazole from 1.5 tabs to 1 tablet daily, but unfortunately her TSH suppressed to 0.04 uIU/mL by  01/2023 -Most recent TSH  normal, through PCPs office , no change  Medications : Continue methimazole 5 mg, 1.5 tab daily   2. Left adrenal Adenoma :  -Adrenal imaging shows stability in 2023 -Aldo, renin, Aldo/renin ratio normal 07/2022 -24-hour urine catecholamines and cortisol were normal 11/2022 -No clinical evidence of hyperaldosteronism, or hypercortisolism at this time   3.  Graves' disease:   -No extrathyroidal manifestations of Graves' disease   F/U in 6 months    Signed electronically by: Lyndle Herrlich, MD  [facility] Endocrinology  [facility] [address] [place] [place], [place] [address] Phone: [phone] FAX: [phone]   CC: Dorcas Carrow DO [address] [place] [address] Phone: [phone] Fax: [phone]   Return to Endocrinology clinic as below: Future Appointments  Date Time Provider Department Center  09/10/2023  7:45 AM Ihor Austin, NP [facility] None  10/11/2023  3:40 PM Olevia Perches P, DO [facility]

## 2023-08-02 ENCOUNTER — Other Ambulatory Visit: Payer: Self-pay | Admitting: Family Medicine

## 2023-08-02 NOTE — Telephone Encounter (Signed)
Requested Prescriptions  Pending Prescriptions Disp Refills   nortriptyline (PAMELOR) 50 MG capsule [Pharmacy Med Name: NORTRIPTYLINE CAPS 50MG ] 90 capsule 0    Sig: TAKE 1 CAPSULE AT BEDTIME     Psychiatry:  Antidepressants - Heterocyclics (TCAs) Passed - 08/02/2023 10:18 AM      Passed - Valid encounter within last 6 months    Recent Outpatient Visits           3 months ago Mixed hyperlipidemia   [place] [facility] [place], Megan P, DO   8 months ago Type 2 diabetes mellitus with neurological complications [facility])   [place] [facility] [place], Megan P, DO   11 months ago Routine general medical examination at a health care facility   [facility] [place], [place], DO   1 year ago Gall stones   [facility] [place], [place], DO   1 year ago COVID-19   [place] [facility] [place], Oralia Rud, DO       Future Appointments             In 2 months Laural Benes, Oralia Rud, DO [place] [facility], [facility]

## 2023-08-24 ENCOUNTER — Telehealth: Payer: Self-pay | Admitting: Family Medicine

## 2023-08-24 NOTE — Telephone Encounter (Signed)
Copied from CRM [id number]. Topic: General - Other >> Aug 24, 2023  9:31 AM Turkey B wrote: Reason for CRM: pt, says received call to have a diabetic eye test, but I dont show anything. Please cb

## 2023-08-31 ENCOUNTER — Other Ambulatory Visit
Admission: RE | Admit: 2023-08-31 | Discharge: 2023-08-31 | Disposition: A | Discharge status: Home / Self Care | Payer: Managed Care, Other (non HMO) | Attending: Cardiology | Admitting: Cardiology

## 2023-08-31 DIAGNOSIS — E782 Mixed hyperlipidemia: Secondary | ICD-10-CM | POA: Insufficient documentation

## 2023-08-31 DIAGNOSIS — I700 Atherosclerosis of aorta: Secondary | ICD-10-CM | POA: Diagnosis present

## 2023-08-31 LAB — HEPATIC FUNCTION PANEL
ALT: 23 U/L (ref 0–44)
AST: 25 U/L (ref 15–41)
Albumin: 4.3 g/dL (ref 3.5–5.0)
Alkaline Phosphatase: 87 U/L (ref 38–126)
Bilirubin, Direct: <0.1 mg/dL (ref 0.0–0.2)
Total Bilirubin: 0.5 mg/dL (ref 0.0–1.2)
Total Protein: 7.9 g/dL (ref 6.5–8.1)

## 2023-08-31 LAB — LIPID PANEL
Cholesterol: 194 mg/dL (ref 0–200)
HDL: 50 mg/dL (ref >40)
LDL Cholesterol: 120 mg/dL — ABNORMAL HIGH (ref 0–99)
Total CHOL/HDL Ratio: 3.9 {ratio}
Triglycerides: 120 mg/dL (ref <150)
VLDL: 24 mg/dL (ref 0–40)

## 2023-09-05 ENCOUNTER — Other Ambulatory Visit: Payer: Self-pay

## 2023-09-05 DIAGNOSIS — E782 Mixed hyperlipidemia: Secondary | ICD-10-CM

## 2023-09-05 DIAGNOSIS — I2510 Atherosclerotic heart disease of native coronary artery without angina pectoris: Secondary | ICD-10-CM

## 2023-09-10 ENCOUNTER — Encounter: Payer: Self-pay | Admitting: Adult Health

## 2023-09-10 ENCOUNTER — Ambulatory Visit: Payer: Managed Care, Other (non HMO) | Admitting: Adult Health

## 2023-09-10 VITALS — BP 110/79 | HR 79 | Ht 64.0 in | Wt 191.0 lb

## 2023-09-10 DIAGNOSIS — R4701 Aphasia: Secondary | ICD-10-CM | POA: Diagnosis not present

## 2023-09-10 DIAGNOSIS — G43809 Other migraine, not intractable, without status migrainosus: Secondary | ICD-10-CM

## 2023-09-10 DIAGNOSIS — R413 Other amnesia: Secondary | ICD-10-CM

## 2023-09-10 MED ORDER — MECLIZINE HCL 25 MG PO TABS: 25.0000 mg | ORAL_TABLET | Freq: Two times a day (BID) | ORAL | 5 refills | Status: DC | PRN

## 2023-09-10 NOTE — Progress Notes (Signed)
 Referring:  Dorcas Carrow, DO [address] [place],  [place] [address]  PCP: Dorcas Carrow, DO  Neurology was asked to evaluate Carol Becker, a 61 for a chief complaint of headaches.  Our recommendations of care will be communicated by shared medical record.    CC:  vertigo   History provided from self, husband  HPI:   Consult visit 03/07/2022 Dr. Delena Bali: Medical co-morbidities: DM2, hyperthyroidism, anxiety  The patient presents for evaluation of vertigo which began several years ago. This worsened when she had a lumpectomy in 2022. She has been diagnosed with vestibular migraine, though she reports she has never had a headache associated with it. She does report a history of "sinus headaches". Vertigo can last for several hours at a time. It is triggered by busy visual patterns, like grocery stores with multiple items in the aisles. Can be triggered by riding in the car when she does not know which direction it will turn. Vertigo can last several hours and she will sometimes need to sleep it off. Vertigo is associated with nausea, and she takes dramamine and meclizine as needed. Tried Zofran previously which was ineffective. She takes nortriptyline at bedtime which has helped reduce the frequency of her vertigo. She is also on Effexor for anxiety.  She has a history of BPPV which feels different from her current vertigo. This vertigo is positional and resolves with Epley maneuver. Did vestibular therapy previously including visual fixation exercises, which she did not find helpful.  She has also started to get intermittently disoriented. Has gotten lost while she was driving. Just last week went to the restroom at [facility] and completely forgot where she was.  Also notices a tremor with fine motor movements which occurs when she overexerts herself. Of note, recent TSH was <0.01 and she is currently having her thyroid medications adjusted.  Vision has gotten blurry over the past year.  Initially was intermittent but now it is all the time.   Update 06/15/2022 JM: Patient returns for 41-month follow-up accompanied by her husband.  At prior visit, venlafaxine dosage decreased to 150mg  and increase nortriptyline dosage to 50mg  for possible vestibular migraines, also provided Ubrelvy samples to take at onset of vertigo attacks.    Reports since prior visit, she has not experienced any migraine headaches or vertigo episodes.  She has been tolerating medication dosage adjustments well.  She has not tried [place] as she has not had any additional episodes.   Prior complaints of blurry vision, diagnosed with bilateral cataracts which were removed in October, no longer has blurred vision.  Does have continued cognitive complaints.  Continues to get disoriented while driving.  Has been present over the past 4 to 5 months.  She has been gradually decreasing Klonopin dosage and is hopeful to be able to completely stop by the end of this week.  She is also completely discontinued BuSpar.  Denies any progression since onset.  Of note, prior TSH was <0.01 and is currently awaiting to be seen by [facility] endocrinology for further management but does not have initial evaluation until May.    Update 03/13/2023 JM: Patient returns for follow-up visit accompanied by her husband.  At prior visit, no recent migraines or vertigo episodes, continued on nortriptyline and venlafaxine for preventative and Ubrelvy for rescue.  Reports doing very well up until 2 weeks ago, has had 3 vertigo episodes. Still triggered by visual overstimulation.  Associated with nausea/motion sickness sensation, use of Dramamine with some  benefit. Did try Bernita Raisin but no benefit. Will need to lay down.  Continues on nortriptyline 50 mg and venlafaxine 150 mg. Reports overall not feeling well over the past 2 weeks, has been having mid abdominal pain over the past 2 weeks, is scheduled to see PCP 10/3. Denies any changes to medication or  diet.   Reports continued cognitive issues with some worsening since prior visit. MMSE 30/30. Forgets words frequently, more frequent occurrences of getting lost while driving, forgetting what she is in the middle of doing or what she is looking for (more noticeable at work). Does report history of Alzheimer's in both grandparents and mother.  Has since establish care with endocrinology for hyperthyroidism - recent adjustments made to methimazole d/t TSH 0.04  Reports being diagnosed with COPD and CAD since prior visit.   Update 09/10/2023 JM: Patient returns for 48-month follow-up visit accompanied by her husband.  She has been having more episodes recently of vertigo and nausea, about 1x per week, will use Dramamine and symptoms were resolved usually by 60 minutes.  She has previously used meclizine with greater benefit.  Has noticed some issues with motion sickness while riding or driving in a car.  She has not had any actual headaches.  Remains on nortriptyline 50 mg nightly and venlafaxine 150 mg daily. She also mentions issues with "stomach spasming" sensation which causes nausea and only lasts for very short duration, has been present since she has her gallbladder removed.   She continues to experience memory difficulties such as easily losing her train of thought or word finding difficulty.  Previously ordered MRI brain but was declined by insurance as she did have an MRI completed in 2022 although was not having these issues at that time.  ATN profile was not consistent with Alzheimer's dementia.     Current Treatment: Abortive Dramamine  Preventative Nortriptyline 50 mg QHS Effexor 150 mg daily  Prior Therapies                                 Effexor 225 mg daily Nortriptyline 30 mg QHS Gabapentin 600/1200 Seroquel - tremor Ubrelvy - no benefit     LABS: CBC    Component Value Date/Time   WBC 6.1 04/12/2023 1416   WBC 8.2 09/01/2022 0905   RBC 4.05 04/12/2023 1416    RBC 4.32 09/01/2022 0905   HGB 11.8 04/12/2023 1416   HCT 36.7 04/12/2023 1416   PLT 296 04/12/2023 1416   MCV 91 04/12/2023 1416   MCH 29.1 04/12/2023 1416   MCH 28.2 09/01/2022 0905   MCHC 32.2 04/12/2023 1416   MCHC 32.3 09/01/2022 0905   RDW 13.8 04/12/2023 1416   LYMPHSABS 2.0 04/12/2023 1416   EOSABS 0.2 04/12/2023 1416   BASOSABS 0.1 04/12/2023 1416      Latest Ref Rng & Units 08/31/2023    9:09 AM 04/12/2023    2:16 PM 03/13/2023   11:09 AM  CMP  Glucose 70 - 99 mg/dL  88  86   BUN 8 - 27 mg/dL  12  12   Creatinine 1.61 - 1.00 mg/dL  0.96  0.45   Sodium 409 - 144 mmol/L  137  139   Potassium 3.5 - 5.2 mmol/L  4.3  4.3   Chloride 96 - 106 mmol/L  101  101   CO2 20 - 29 mmol/L  25  26   Calcium 8.7 - 10.3  mg/dL  9.6  9.5   Total Protein 6.5 - 8.1 g/dL 7.9  6.8  6.9   Total Bilirubin 0.0 - 1.2 mg/dL 0.5  <1.6  <1.0   Alkaline Phos 38 - 126 U/L 87  117  121   AST 15 - 41 U/L 25  20  21    ALT 0 - 44 U/L 23  15  14       IMAGING:  MRI brain 09/03/20: Brain unremarkable. 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         Current Outpatient Medications on File Prior to Visit  Medication Sig Dispense Refill   aspirin EC 81 MG tablet Take 81 mg by mouth daily. Swallow whole.     gabapentin (NEURONTIN) 800 MG tablet Take 1 pill in the AM and 2 pills in the PM 225 tablet 1   meloxicam (MOBIC) 15 MG tablet Take 1 tablet (15 mg total) by mouth daily. 90 tablet 1   methimazole (TAPAZOLE) 5 MG tablet Take 1.5 tablets (7.5 mg total) by mouth daily. 135 tablet 2   Multiple Vitamin (MULTIVITAMIN PO) Take by mouth.     nortriptyline (PAMELOR) 50 MG capsule TAKE 1 CAPSULE AT BEDTIME 90 capsule 0   omeprazole (PRILOSEC) 20 MG capsule Take 20 mg by mouth daily.     Semaglutide, 2 MG/DOSE, 8 MG/3ML SOPN Inject 2 mg as directed once a week. 9 mL 1   venlafaxine XR (EFFEXOR-XR) 150 MG 24 hr capsule Take 1 capsule (150 mg total) by  mouth at bedtime. 90 capsule 1   No current facility-administered medications on file prior to visit.     Allergies: Allergies  Allergen Reactions   Crestor [Rosuvastatin Calcium] Other (See Comments)    Indigestion/ increase fatigue   Ezetimibe     Increase fatigue   Sulfa Antibiotics Nausea Only   Zithromax [Azithromycin] Other (See Comments)    Not effective    Family History: Migraine or other headaches in the family:  no Aneurysms in a first degree relative:  no Brain tumors in the family:  no Other neurological illness in the family:   Alzheimer's  Past Medical History: Past Medical History:  Diagnosis Date   Adrenal nodule (HCC)    Anxiety    Arthritis    knees, left thumb, shoulders   Breast cancer (HCC)    Left   Cervical spinal stenosis    Family history of breast cancer    GERD (gastroesophageal reflux disease)    History of hypothyroidism    Hyperlipidemia    poss history of   Hyperthyroidism    Hypothyroidism    Osteopenia    PONV (postoperative nausea and vomiting)    after wisdom teeth   Pre-diabetes    Prediabetes    Restless leg syndrome    Sleep apnea    tested ~2012-" lost a lot of weight, no longer has sleep apnea."- per patient   Tremors of nervous system    medication related   Vertigo    Vestibular migraine     Past Surgical History Past Surgical History:  Procedure Laterality Date   ANTERIOR / POSTERIOR COMBINED FUSION CERVICAL SPINE     BREAST BIOPSY Left 03/15/2021   Stereo bx, Ribbon Clip, path pending   BREAST LUMPECTOMY Left    CHOLECYSTECTOMY N/A 09/01/2022   Procedure: LAPAROSCOPIC CHOLECYSTECTOMY;  Surgeon: Fritzi Mandes, MD;  Location: [facility] OR;  Service: General;  Laterality: N/A;   COLONOSCOPY  WITH PROPOFOL N/A 11/05/2020   Procedure: COLONOSCOPY WITH PROPOFOL;  Surgeon: Midge Minium, MD;  Location: [facility];  Service: Endoscopy;  Laterality: N/A;   CYSTECTOMY Left    KNEE ARTHROSCOPY WITH EXCISION BAKER'S  CYST Left    LAPAROSCOPY     Exploratory   POLYPECTOMY  11/05/2020   Procedure: POLYPECTOMY;  Surgeon: Midge Minium, MD;  Location: [facility];  Service: Endoscopy;;   SHOULDER ARTHROSCOPY WITH ROTATOR CUFF REPAIR Left 12/01/2021   Procedure: Left shoulder arthroscopy, debridement, subacromial decompression, distal clavicle resection, chondroplasty;  Surgeon: Francena Hanly, MD;  Location: [facility];  Service: Orthopedics;  Laterality: Left;    TOTAL MASTECTOMY Bilateral    Per patient "Tummy tissue reconstructive surgery" same time   TOTAL SHOULDER ARTHROPLASTY Left 03/16/2022   Procedure: TOTAL SHOULDER ARTHROPLASTY;  Surgeon: Francena Hanly, MD;  Location: [facility];  Service: Orthopedics;  Laterality: Left;   trapeziectomy Left    done at [facility] 06/16/20   TUBAL LIGATION     WISDOM TOOTH EXTRACTION      Social History: Social History   Tobacco Use   Smoking status: Former    Current packs/day: 0.00    Types: Cigarettes    Start date: 04/05/1981    Quit date: 04/06/2011    Years since quitting: 12.4   Smokeless tobacco: Never  Vaping Use   Vaping status: Former   Quit date: 04/05/2013  Substance Use Topics   Alcohol use: Not Currently    Comment: On Occasion   Drug use: No    ROS: Negative for fevers, chills. Positive for vertigo. All other systems reviewed and negative unless stated otherwise in HPI.   Physical Exam:   Vital Signs: BP 110/79   Pulse 79   Ht 5\' 4"  (1.626 m)   Wt 191 lb (86.6 kg)   BMI 32.79 kg/m  GENERAL: well appearing,in no acute distress,alert SKIN:  Color, texture, turgor normal. No rashes or lesions HEAD:  Normocephalic/atraumatic. CV:  RRR RESP: Normal respiratory effort MSK: no tenderness to palpation over occiput, neck, or shoulders  NEUROLOGICAL: Mental Status: Alert, oriented to person, place and time, subjective short-term memory loss, follows commands Cranial Nerves: PERRL, visual fields intact to confrontation, extraocular  movements intact, facial sensation intact, no facial droop or ptosis, hearing grossly intact, no dysarthria Motor: muscle strength 5/5 both upper and lower extremities. No tremor Reflexes: 2+ throughout Sensation: intact to light touch all 4 extremities Coordination: Finger-to- nose-finger intact bilaterally Gait: normal-based      03/13/2023   10:27 AM  MMSE - Mini Mental State Exam  Orientation to time 5  Orientation to Place 5  Registration 3  Attention/ Calculation 5  Recall 3  Language- name 2 objects 2  Language- repeat 1  Language- follow 3 step command 3  Language- read & follow direction 1  Write a sentence 1  Copy design 1  Total score 30        IMPRESSION: 63 year old female with a history of DM2, hyperthyroidism, anxiety who presents for evaluation of vertigo, initial consult visit with Dr. Delena Bali on 03/07/2022. Per Dr. Delena Bali "While vertigo is not associated with headache, she does report a history of 'sinus headaches'. Associated phonophobia and prolonged duration of vertigo episodes does raise suspicion for vestibular migraines."  Symptoms relatively well-controlled on nortriptyline and venlafaxine, can have vertigo with nausea about once per week, use of Dramamine with some benefit but prior use of meclizine with better benefit.  Continues  to complain of cognitive difficulties associated with word finding difficulty. Reports fm hx of Alzheimer's dementia although ATN profile not consistent with Alzheimer's disease.    PLAN: -will place new order for MRI Brain - prior imaging declined by insurance per patient d/t imaging in 2022 although was not having these symptoms at that time. Due to word finding difficulty, it is recommended to complete imaging to r/o vascular etiology -if MR brain and lab work unremarkable, can consider neurocognitive evaluation -Continue nortriptyline 50 mg nightly and venlafaxine 150 mg daily -Restart meclizine 12.5 mg to 25 mg BID  PRN -Continue to follow with endocrinology for hyperthyroidism  -Advised to follow-up with PCP regarding "stomach spasming" sensation, consider GI eval but will defer to PCP     Follow-up in 6 months or call earlier if needed     I spent 30 minutes of face-to-face and non-face-to-face time with patient and husband.  This included previsit chart review, lab review, study review, order entry, electronic health record documentation, patient and husband education and discussion regarding the above and answered all the questions to patient and husband satisfaction  Ihor Austin, AGNP-BC  [facility] [address] [place], [place] [address]  Phone [phone] Fax [phone] Note: This document was prepared with digital dictation and possible smart phrase technology. Any transcriptional errors that result from this process are unintentional.

## 2023-09-10 NOTE — Patient Instructions (Addendum)
 Your Plan:  Continue nortriptyline 50 mg and venlafaxine 150mg  for migraine prevention  Restart meclizine 12.5mg  - 25mg  as needed  Will place order again for MRI brain - please let me know if this gets denied again so I can follow up on this     Follow up in 6 months or call earlier if needed     Thank you for coming to see [place] at [facility]. I hope we have been able to provide you high quality care today.  You may receive a patient satisfaction survey over the next few weeks. We would appreciate your feedback and comments so that we may continue to improve ourselves and the health of our patients.

## 2023-09-26 ENCOUNTER — Ambulatory Visit
Admission: RE | Admit: 2023-09-26 | Discharge: 2023-09-26 | Disposition: A | Discharge status: Home / Self Care | Source: Ambulatory Visit | Attending: Adult Health | Admitting: Adult Health

## 2023-09-26 ENCOUNTER — Other Ambulatory Visit

## 2023-09-26 DIAGNOSIS — R413 Other amnesia: Secondary | ICD-10-CM | POA: Diagnosis not present

## 2023-09-26 DIAGNOSIS — R4701 Aphasia: Secondary | ICD-10-CM

## 2023-09-27 ENCOUNTER — Other Ambulatory Visit

## 2023-09-27 ENCOUNTER — Encounter: Payer: Self-pay | Admitting: Adult Health

## 2023-10-01 ENCOUNTER — Other Ambulatory Visit: Payer: Self-pay | Admitting: *Deleted

## 2023-10-01 DIAGNOSIS — R413 Other amnesia: Secondary | ICD-10-CM

## 2023-10-03 ENCOUNTER — Telehealth: Payer: Self-pay | Admitting: Adult Health

## 2023-10-03 NOTE — Telephone Encounter (Signed)
 Referral for neuropsychology fax to [facility]. [phone], Fax: [phone]

## 2023-10-11 ENCOUNTER — Encounter: Payer: Self-pay | Admitting: Family Medicine

## 2023-10-11 ENCOUNTER — Other Ambulatory Visit: Payer: Self-pay | Admitting: Family Medicine

## 2023-10-12 NOTE — Telephone Encounter (Signed)
 Courtesy refill given, please keep scheduled appointment to receive additional refills.  Requested Prescriptions  Pending Prescriptions Disp Refills   Semaglutide, 2 MG/DOSE, (OZEMPIC, 2 MG/DOSE,) 8 MG/3ML SOPN [Pharmacy Med Name: OZEMPIC PEN (2MG /DOSE) 8MG /3ML] 3 mL 0    Sig: INJECT 2 MG ONCE A WEEK AS DIRECTED     Endocrinology:  Diabetes - GLP-1 Receptor Agonists - semaglutide Failed - 10/12/2023  9:58 AM      Failed - HBA1C in normal range and within 180 days    HB A1C (BAYER DCA - WAIVED)  Date Value Ref Range Status  12/01/2022 5.8 (H) 4.8 - 5.6 % Final    Comment:             Prediabetes: 5.7 - 6.4          Diabetes: >6.4          Glycemic control for adults with diabetes: <7.0    Hgb A1c MFr Bld  Date Value Ref Range Status  04/12/2023 5.8 (H) 4.8 - 5.6 % Final    Comment:             Prediabetes: 5.7 - 6.4          Diabetes: >6.4          Glycemic control for adults with diabetes: <7.0          Failed - Valid encounter within last 6 months    Recent Outpatient Visits   None     Future Appointments             In 1 month Johnson, Megan P, DO [facility]            Passed - Cr in normal range and within 360 days    Creatinine, Ser  Date Value Ref Range Status  04/12/2023 0.92 0.57 - 1.00 mg/dL Final

## 2023-10-18 NOTE — Telephone Encounter (Signed)
 Spoke with Carol Becker informed her sent referral for neuropsychology on 10/03/23 and gave her the phone number [phone]. Carol Becker said she will call them.

## 2023-10-19 ENCOUNTER — Other Ambulatory Visit: Payer: Self-pay

## 2023-10-19 ENCOUNTER — Telehealth: Payer: Self-pay

## 2023-10-19 NOTE — Telephone Encounter (Signed)
 Routed to PCP as refill request for 90- days supply.

## 2023-10-19 NOTE — Telephone Encounter (Signed)
 Copied from CRM [id number]. Topic: Clinical - Prescription Issue >> Oct 17, 2023  2:16 PM Elizebeth Brooking wrote: Reason for CRM: Semaglutide, 2 MG/DOSE, (OZEMPIC, 2 MG/DOSE,) 8 MG/3ML SOPN ,  Was prescribed for only 28 days. For [facility] scripts the minimum is 3 month supply

## 2023-10-22 ENCOUNTER — Encounter: Payer: Self-pay | Admitting: Family Medicine

## 2023-10-26 ENCOUNTER — Telehealth: Admitting: Family Medicine

## 2023-10-26 ENCOUNTER — Encounter: Payer: Self-pay | Admitting: Family Medicine

## 2023-10-26 VITALS — BP 129/81 | HR 89 | Ht 64.0 in | Wt 180.0 lb

## 2023-10-26 DIAGNOSIS — M79602 Pain in left arm: Secondary | ICD-10-CM

## 2023-10-26 DIAGNOSIS — I700 Atherosclerosis of aorta: Secondary | ICD-10-CM | POA: Diagnosis not present

## 2023-10-26 DIAGNOSIS — E0590 Thyrotoxicosis, unspecified without thyrotoxic crisis or storm: Secondary | ICD-10-CM

## 2023-10-26 DIAGNOSIS — R61 Generalized hyperhidrosis: Secondary | ICD-10-CM | POA: Diagnosis not present

## 2023-10-26 DIAGNOSIS — I2510 Atherosclerotic heart disease of native coronary artery without angina pectoris: Secondary | ICD-10-CM

## 2023-10-26 DIAGNOSIS — E559 Vitamin D deficiency, unspecified: Secondary | ICD-10-CM

## 2023-10-26 DIAGNOSIS — E1149 Type 2 diabetes mellitus with other diabetic neurological complication: Secondary | ICD-10-CM

## 2023-10-26 DIAGNOSIS — R454 Irritability and anger: Secondary | ICD-10-CM

## 2023-10-26 LAB — MICROALBUMIN, URINE WAIVED
Creatinine, Urine Waived: 200 mg/dL (ref 10–300)
Microalb, Ur Waived: 150 mg/L — ABNORMAL HIGH (ref 0–19)

## 2023-10-26 LAB — BAYER DCA HB A1C WAIVED: HB A1C (BAYER DCA - WAIVED): 6.2 % — ABNORMAL HIGH (ref 4.8–5.6)

## 2023-10-26 MED ORDER — BUPROPION HCL ER (SR) 150 MG PO TB12: ORAL_TABLET | ORAL | 2 refills | Status: DC

## 2023-10-26 MED ORDER — CHOLESTYRAMINE 4 G PO PACK: 4.0000 g | PACK | Freq: Three times a day (TID) | ORAL | 2 refills | Status: DC

## 2023-10-26 NOTE — Assessment & Plan Note (Signed)
 Checking labs today. Await results. Treat as needed.

## 2023-10-26 NOTE — Progress Notes (Signed)
 BP 129/81 (BP Location: Left Arm, Cuff Size: Normal)   Pulse 89   Ht 5\' 4"  (1.626 m)   Wt 180 lb (81.6 kg)   BMI 30.90 kg/m    Subjective:    Patient ID: Carol Becker, female    DOB: Oct 03, 1960, 63 y.o.   MRN: [id number]  HPI: Carol Becker is a 63 y.o. female initially seen as a virtual visit and then asked to come in- visit done in person.  Chief Complaint  Patient presents with   Hot Flashes    Onset about 3 months. Occurs with exertion mostly. However, it does occur randomly at times.    Stress    Pt states that she has been very emotional. Feels like she getting more frustrated easily. Feels like she is having lack of patience. Onset about 3 weeks ago.    Fatigue   Has been having hot flashes for about 3 months- was happening very occasionally, now is happening daily for about 3 weeks. She notes that it seems to be happening with going up hills and exertion. She notes with any exertion she will turn bright read and sweat profusely. No CP or SOB but diaphoresis. She also notes that her L arm has been hurting and feeling weak. She had surgery done on her L shoulder, but notes that it's worse recently.  She has been really tired with this also.   ANXIETY/STRESS- doesn't feel like it was several years ago. She is seeing neurology and she is having neuropsych testing done, but hasn't had it yet. She notes that her she is getting really frustrated  Duration: about 3 weeks Status:exacerbated Anxious mood: no  Excessive worrying: no Irritability: yes  Sweating: yes Nausea: yes Palpitations:no Hyperventilation: no Panic attacks: no Agoraphobia: no  Obscessions/compulsions: no Depressed mood: no    10/26/2023    8:27 AM 08/31/2022    3:07 PM 02/24/2022    2:45 PM 11/18/2021   10:08 AM 05/20/2021    8:15 AM  Depression screen PHQ 2/9  Decreased Interest 0 0 0 0 0  Down, Depressed, Hopeless 0 0 0 0 0  PHQ - 2 Score 0 0 0 0 0  Altered sleeping 0 1 0 0 0  Tired,  decreased energy 3 0 1 1 2   Change in appetite 0 0 0 0 0  Feeling bad or failure about yourself  0 0 0 0 0  Trouble concentrating 0 0 0 0 0  Moving slowly or fidgety/restless 0 0 0 0 0  Suicidal thoughts 0 0 0 0 0  PHQ-9 Score 3 1 1 1 2   Difficult doing work/chores Not difficult at all Not difficult at all Not difficult at all Not difficult at all       10/26/2023    8:29 AM 08/31/2022    3:08 PM 02/24/2022    2:45 PM 11/18/2021   10:09 AM  GAD 7 : Generalized Anxiety Score  Nervous, Anxious, on Edge 0 1 1 1   Control/stop worrying 0 1 0 1  Worry too much - different things 0 0 1 1  Trouble relaxing  0 0 0  Restless 0 0 0 0  Easily annoyed or irritable 3 1 0 1  Afraid - awful might happen 0 0 0 1  Total GAD 7 Score  3 2 5   Anxiety Difficulty Not difficult at all Not difficult at all Not difficult at all Not difficult at all   Anhedonia: no Weight changes:  no Insomnia: no   Hypersomnia: no Fatigue/loss of energy: yes Feelings of worthlessness: no Feelings of guilt: no Impaired concentration/indecisiveness: no Suicidal ideations: no  Crying spells: no Recent Stressors/Life Changes: no   Relationship problems: no   Family stress: no     Financial stress: no    Job stress: no    Recent death/loss: no  Relevant past medical, surgical, family and social history reviewed and updated as indicated. Interim medical history since our last visit reviewed. Allergies and medications reviewed and updated.  Review of Systems  Constitutional:  Positive for diaphoresis and fatigue. Negative for activity change, appetite change, chills, fever and unexpected weight change.  Respiratory: Negative.    Cardiovascular: Negative.   Gastrointestinal:  Positive for nausea. Negative for abdominal distention, abdominal pain, anal bleeding, blood in stool, constipation, diarrhea, rectal pain and vomiting.  Endocrine: Negative.   Musculoskeletal:  Positive for myalgias. Negative for arthralgias, back  pain, gait problem, joint swelling, neck pain and neck stiffness.  Neurological: Negative.   Psychiatric/Behavioral:  Negative for agitation, behavioral problems, confusion, decreased concentration, dysphoric mood, hallucinations, self-injury, sleep disturbance and suicidal ideas. The patient is nervous/anxious. The patient is not hyperactive.     Per HPI unless specifically indicated above     Objective:    BP 129/81 (BP Location: Left Arm, Cuff Size: Normal)   Pulse 89   Ht 5\' 4"  (1.626 m)   Wt 180 lb (81.6 kg)   BMI 30.90 kg/m   Wt Readings from Last 3 Encounters:  10/26/23 180 lb (81.6 kg)  09/10/23 191 lb (86.6 kg)  06/13/23 196 lb (88.9 kg)    Physical Exam Vitals and nursing note reviewed.  Constitutional:      General: She is not in acute distress.    Appearance: Normal appearance. She is ill-appearing. She is not toxic-appearing or diaphoretic.  HENT:     Head: Normocephalic and atraumatic.     Right Ear: External ear normal.     Left Ear: External ear normal.     Nose: Nose normal.     Mouth/Throat:     Mouth: Mucous membranes are moist.     Pharynx: Oropharynx is clear.  Eyes:     General: No scleral icterus.       Right eye: No discharge.        Left eye: No discharge.     Extraocular Movements: Extraocular movements intact.     Conjunctiva/sclera: Conjunctivae normal.     Pupils: Pupils are equal, round, and reactive to light.  Cardiovascular:     Rate and Rhythm: Normal rate and regular rhythm.     Pulses: Normal pulses.     Heart sounds: Normal heart sounds. No murmur heard.    No friction rub. No gallop.  Pulmonary:     Effort: Pulmonary effort is normal. No respiratory distress.     Breath sounds: Normal breath sounds. No stridor. No wheezing, rhonchi or rales.  Chest:     Chest wall: No tenderness.  Musculoskeletal:        General: Normal range of motion.     Cervical back: Normal range of motion and neck supple.  Skin:    General: Skin is warm  and dry.     Capillary Refill: Capillary refill takes less than 2 seconds.     Coloration: Skin is pale. Skin is not jaundiced.     Findings: No bruising, erythema, lesion or rash.  Neurological:     General: No  focal deficit present.     Mental Status: She is alert and oriented to person, place, and time. Mental status is at baseline.  Psychiatric:        Mood and Affect: Mood normal.        Behavior: Behavior normal.        Thought Content: Thought content normal.        Judgment: Judgment normal.     Results for orders placed or performed in visit on 10/26/23  Microalbumin, Urine Waived   Collection Time: 10/26/23 10:04 AM  Result Value Ref Range   Microalb, Ur Waived 150 (H) 0 - 19 mg/L   Creatinine, Urine Waived 200 10 - 300 mg/dL   Microalb/Creat Ratio 30-300 (H) <30 mg/g  Bayer DCA Hb A1c Waived   Collection Time: 10/26/23 10:04 AM  Result Value Ref Range   HB A1C (BAYER DCA - WAIVED) 6.2 (H) 4.8 - 5.6 %      Assessment & Plan:   Problem List Items Addressed This Visit       Cardiovascular and Mediastinum   Aortic atherosclerosis (HCC)   Would like to see a new cardiologist. Will keep her blood pressure and cholesterol under good control. Continue to monitor. Call with any concerns.       Relevant Medications   cholestyramine  (QUESTRAN ) 4 g packet   Other Relevant Orders   Ambulatory referral to Cardiology   CAD (coronary artery disease)   Having diaphoresis on exertion for the last 3 weeks. Known CAD found incidentally on CT chest. Has not had a ETT in several years. Would like to see a new cardiologist- will get her in ASAP. Concern for anginal equivalent. Call with any concerns.        Relevant Medications   cholestyramine  (QUESTRAN ) 4 g packet   Other Relevant Orders   Ambulatory referral to Cardiology     Endocrine   Hyperthyroidism   Checking labs today. Await results. Treat as needed.       Relevant Orders   Thyroid  Panel With TSH   Type 2  diabetes mellitus with neurological complications (HCC)   Checking labs today. Await results. Treat as needed.       Relevant Orders   CBC with Differential/Platelet   Comprehensive metabolic panel with GFR   Lipid Panel w/o Chol/HDL Ratio   Microalbumin, Urine Waived (Completed)   Bayer DCA Hb A1c Waived (Completed)   Other Visit Diagnoses       Diaphoresis    -  Primary   EKG normal today- see discussion under CAD.   Relevant Orders   EKG 12-Lead   CBC with Differential/Platelet   Ambulatory referral to Cardiology     Vitamin D  deficiency       Checking labs today. Await results. Treat as needed.   Relevant Orders   VITAMIN D  25 Hydroxy (Vit-D Deficiency, Fractures)     Left arm pain       Likey due to MSK issues, but given other symptoms, mild concern for anginal equivalent- will get her into cardiology ASAP.   Relevant Orders   Ambulatory referral to Cardiology     Irritability       Will start wellbutrin  to help with brain fog and irritability until she sees neuropsych. Call with any concerns. Follow up in 1 month as scheduled.        Follow up plan: Return for As scheduled.

## 2023-10-26 NOTE — Assessment & Plan Note (Signed)
 Having diaphoresis on exertion for the last 3 weeks. Known CAD found incidentally on CT chest. Has not had a ETT in several years. Would like to see a new cardiologist- will get her in ASAP. Concern for anginal equivalent. Call with any concerns.

## 2023-10-26 NOTE — Assessment & Plan Note (Signed)
 Would like to see a new cardiologist. Will keep her blood pressure and cholesterol under good control. Continue to monitor. Call with any concerns.

## 2023-10-27 LAB — COMPREHENSIVE METABOLIC PANEL WITH GFR
ALT: 43 IU/L — ABNORMAL HIGH (ref 0–32)
AST: 35 IU/L (ref 0–40)
Albumin: 4.5 g/dL (ref 3.9–4.9)
Alkaline Phosphatase: 113 IU/L (ref 44–121)
BUN/Creatinine Ratio: 14 (ref 12–28)
BUN: 13 mg/dL (ref 8–27)
Bilirubin Total: 0.2 mg/dL (ref 0.0–1.2)
CO2: 24 mmol/L (ref 20–29)
Calcium: 9.7 mg/dL (ref 8.7–10.3)
Chloride: 100 mmol/L (ref 96–106)
Creatinine, Ser: 0.9 mg/dL (ref 0.57–1.00)
Globulin, Total: 2.7 g/dL (ref 1.5–4.5)
Glucose: 86 mg/dL (ref 70–99)
Potassium: 4.6 mmol/L (ref 3.5–5.2)
Sodium: 138 mmol/L (ref 134–144)
Total Protein: 7.2 g/dL (ref 6.0–8.5)
eGFR: 73 mL/min/{1.73_m2} (ref >59)

## 2023-10-27 LAB — THYROID PANEL WITH TSH
Free Thyroxine Index: 1.6 (ref 1.2–4.9)
T3 Uptake Ratio: 26 % (ref 24–39)
T4, Total: 6.3 ug/dL (ref 4.5–12.0)
TSH: 0.653 u[IU]/mL (ref 0.450–4.500)

## 2023-10-27 LAB — CBC WITH DIFFERENTIAL/PLATELET
Basophils Absolute: 0.1 10*3/uL (ref 0.0–0.2)
Basos: 1 %
EOS (ABSOLUTE): 0.1 10*3/uL (ref 0.0–0.4)
Eos: 1 %
Hematocrit: 39.8 % (ref 34.0–46.6)
Hemoglobin: 12.8 g/dL (ref 11.1–15.9)
Immature Grans (Abs): 0 10*3/uL (ref 0.0–0.1)
Immature Granulocytes: 0 %
Lymphocytes Absolute: 1.3 10*3/uL (ref 0.7–3.1)
Lymphs: 23 %
MCH: 28.7 pg (ref 26.6–33.0)
MCHC: 32.2 g/dL (ref 31.5–35.7)
MCV: 89 fL (ref 79–97)
Monocytes Absolute: 0.5 10*3/uL (ref 0.1–0.9)
Monocytes: 9 %
Neutrophils Absolute: 3.8 10*3/uL (ref 1.4–7.0)
Neutrophils: 66 %
Platelets: 355 10*3/uL (ref 150–450)
RBC: 4.46 x10E6/uL (ref 3.77–5.28)
RDW: 14.6 % (ref 11.7–15.4)
WBC: 5.8 10*3/uL (ref 3.4–10.8)

## 2023-10-27 LAB — VITAMIN D 25 HYDROXY (VIT D DEFICIENCY, FRACTURES): Vit D, 25-Hydroxy: 51.9 ng/mL (ref 30.0–100.0)

## 2023-10-27 LAB — LIPID PANEL W/O CHOL/HDL RATIO
Cholesterol, Total: 165 mg/dL (ref 100–199)
HDL: 55 mg/dL (ref >39)
LDL Chol Calc (NIH): 92 mg/dL (ref 0–99)
Triglycerides: 98 mg/dL (ref 0–149)
VLDL Cholesterol Cal: 18 mg/dL (ref 5–40)

## 2023-10-29 ENCOUNTER — Encounter: Payer: Self-pay | Admitting: Family Medicine

## 2023-10-30 MED ORDER — OZEMPIC (2 MG/DOSE) 8 MG/3ML ~~LOC~~ SOPN: 2.0000 mg | PEN_INJECTOR | SUBCUTANEOUS | 0 refills | Status: DC

## 2023-11-02 ENCOUNTER — Encounter: Payer: Self-pay | Admitting: Family Medicine

## 2023-11-02 ENCOUNTER — Other Ambulatory Visit: Payer: Self-pay | Admitting: Family Medicine

## 2023-11-02 NOTE — Telephone Encounter (Signed)
 Requested Prescriptions  Pending Prescriptions Disp Refills   nortriptyline  (PAMELOR ) 50 MG capsule [Pharmacy Med Name: NORTRIPTYLINE  CAPS 50MG ] 90 capsule 0    Sig: TAKE 1 CAPSULE AT BEDTIME     Psychiatry:  Antidepressants - Heterocyclics (TCAs) Failed - 11/02/2023  2:05 PM      Failed - Valid encounter within last 6 months    Recent Outpatient Visits           1 week ago Diaphoresis   [facility] [place], Jerilee Montane, DO       Future Appointments             In 3 weeks Lincoln Renshaw, Jerilee Montane, DO [facility]

## 2023-11-05 ENCOUNTER — Other Ambulatory Visit: Payer: Self-pay | Admitting: Family Medicine

## 2023-11-06 NOTE — Telephone Encounter (Signed)
 Requested Prescriptions  Pending Prescriptions Disp Refills   meloxicam  (MOBIC ) 15 MG tablet [Pharmacy Med Name: MELOXICAM  TABS 15MG ] 90 tablet 0    Sig: TAKE 1 TABLET DAILY     Analgesics:  COX2 Inhibitors Failed - 11/06/2023  3:41 PM      Failed - Manual Review: Labs are only required if the patient has taken medication for more than 8 weeks.      Failed - ALT in normal range and within 360 days    ALT  Date Value Ref Range Status  10/26/2023 43 (H) 0 - 32 IU/L Final         Failed - Valid encounter within last 12 months    Recent Outpatient Visits           1 week ago Diaphoresis   [facility] [place], Manuel Garcia, DO       Future Appointments             In 3 weeks Lincoln Renshaw, Jerilee Montane, DO [facility]            Passed - HGB in normal range and within 360 days    Hemoglobin  Date Value Ref Range Status  10/26/2023 12.8 11.1 - 15.9 g/dL Final         Passed - Cr in normal range and within 360 days    Creatinine, Ser  Date Value Ref Range Status  10/26/2023 0.90 0.57 - 1.00 mg/dL Final         Passed - HCT in normal range and within 360 days    Hematocrit  Date Value Ref Range Status  10/26/2023 39.8 34.0 - 46.6 % Final         Passed - AST in normal range and within 360 days    AST  Date Value Ref Range Status  10/26/2023 35 0 - 40 IU/L Final         Passed - eGFR is 30 or above and within 360 days    GFR calc Af Amer  Date Value Ref Range Status  08/27/2020 93 >59 mL/min/1.73 Final    Comment:    **In accordance with recommendations from the NKF-ASN Task force,**   [facility] is in the process of updating its eGFR calculation to the   2021 CKD-EPI creatinine equation that estimates kidney function   without a race variable.    GFR, Estimated  Date Value Ref Range Status  03/10/2022 >60 >60 mL/min Final    Comment:    (NOTE) Calculated using the CKD-EPI Creatinine Equation (2021)    GFR  Date  Value Ref Range Status  08/09/2022 67.70 >60.00 mL/min Final    Comment:    Calculated using the CKD-EPI Creatinine Equation (2021)   eGFR  Date Value Ref Range Status  10/26/2023 73 >59 mL/min/1.73 Final         Passed - Patient is not pregnant       venlafaxine  XR (EFFEXOR -XR) 150 MG 24 hr capsule [Pharmacy Med Name: VENLAFAXINE  HCL ER CAPS 150MG ] 90 capsule 0    Sig: TAKE 1 CAPSULE AT BEDTIME     Psychiatry: Antidepressants - SNRI - desvenlafaxine & venlafaxine  Failed - 11/06/2023  3:41 PM      Failed - Valid encounter within last 6 months    Recent Outpatient Visits           1 week ago Diaphoresis   [facility],  Jerilee Montane, DO       Future Appointments             In 3 weeks Lincoln Renshaw, Jerilee Montane, DO [facility]            Failed - Lipid Panel in normal range within the last 12 months    Cholesterol, Total  Date Value Ref Range Status  10/26/2023 165 100 - 199 mg/dL Final   Cholesterol Piccolo, Waived  Date Value Ref Range Status  06/13/2016 155 <200 mg/dL Final    Comment:                            Desirable                <200                         Borderline High      200- 239                         High                     >239    LDL Chol Calc (NIH)  Date Value Ref Range Status  10/26/2023 92 0 - 99 mg/dL Final   HDL  Date Value Ref Range Status  10/26/2023 55 >39 mg/dL Final   Triglycerides  Date Value Ref Range Status  10/26/2023 98 0 - 149 mg/dL Final   Triglycerides Piccolo,Waived  Date Value Ref Range Status  06/13/2016 155 (H) <150 mg/dL Final    Comment:                            Normal                   <150                         Borderline High     150 - 199                         High                200 - 499                         Very High                >499          Passed - Cr in normal range and within 360 days    Creatinine, Ser  Date Value Ref Range  Status  10/26/2023 0.90 0.57 - 1.00 mg/dL Final         Passed - Last BP in normal range    BP Readings from Last 1 Encounters:  10/26/23 129/81

## 2023-11-07 ENCOUNTER — Encounter: Payer: Self-pay | Admitting: Adult Health

## 2023-11-07 DIAGNOSIS — G43809 Other migraine, not intractable, without status migrainosus: Secondary | ICD-10-CM

## 2023-11-07 DIAGNOSIS — R413 Other amnesia: Secondary | ICD-10-CM

## 2023-11-08 NOTE — Telephone Encounter (Signed)
 Pt has called to f/u on the processing of her referral

## 2023-11-12 NOTE — Telephone Encounter (Incomplete Revision)
 Can refer to [facility] with new neuropsychology. Dr. Tama Fails schedule already scheduling into October

## 2023-11-12 NOTE — Telephone Encounter (Signed)
 Can refer to [facility] with new neuropsychology. Dr. Tama Fails schedule already scheduling into October. Would like referral sent there?

## 2023-11-15 NOTE — Addendum Note (Signed)
 Addended by: Elton Ham on: 11/15/2023 08:35 AM   Modules accepted: Orders

## 2023-11-16 ENCOUNTER — Telehealth: Payer: Self-pay

## 2023-11-16 NOTE — Telephone Encounter (Signed)
 Copied from CRM [id number]. Topic: Referral - Status >> Oct 26, 2023 11:11 AM Rennis Case wrote: Reason for CRM: Pt states she received message from Dr. Christophe Cram office but had told Dr. Lincoln Renshaw that she did not want her referral going to his office.  Advised pt referral sent to [facility] department. Attempted to reach out to [facility] to assist pt in scheduling. While on hold w/ [facility] [place], phone disconnected w/ patient.   Called pt back and lvm.    Forwarding as an FYI

## 2023-11-26 ENCOUNTER — Other Ambulatory Visit: Payer: Self-pay | Admitting: Family Medicine

## 2023-11-27 ENCOUNTER — Ambulatory Visit: Admitting: Family Medicine

## 2023-11-27 ENCOUNTER — Encounter: Payer: Self-pay | Admitting: Family Medicine

## 2023-11-27 VITALS — BP 131/80 | HR 83 | Ht 64.0 in | Wt 180.0 lb

## 2023-11-27 DIAGNOSIS — R413 Other amnesia: Secondary | ICD-10-CM

## 2023-11-27 DIAGNOSIS — Z0000 Encounter for general adult medical examination without abnormal findings: Secondary | ICD-10-CM

## 2023-11-27 DIAGNOSIS — E1149 Type 2 diabetes mellitus with other diabetic neurological complication: Secondary | ICD-10-CM

## 2023-11-27 DIAGNOSIS — E559 Vitamin D deficiency, unspecified: Secondary | ICD-10-CM

## 2023-11-27 DIAGNOSIS — E782 Mixed hyperlipidemia: Secondary | ICD-10-CM | POA: Diagnosis not present

## 2023-11-27 DIAGNOSIS — Z87891 Personal history of nicotine dependence: Secondary | ICD-10-CM

## 2023-11-27 DIAGNOSIS — E0590 Thyrotoxicosis, unspecified without thyrotoxic crisis or storm: Secondary | ICD-10-CM | POA: Diagnosis not present

## 2023-11-27 DIAGNOSIS — T466X5A Adverse effect of antihyperlipidemic and antiarteriosclerotic drugs, initial encounter: Secondary | ICD-10-CM

## 2023-11-27 DIAGNOSIS — F411 Generalized anxiety disorder: Secondary | ICD-10-CM

## 2023-11-27 DIAGNOSIS — Z7985 Long-term (current) use of injectable non-insulin antidiabetic drugs: Secondary | ICD-10-CM

## 2023-11-27 DIAGNOSIS — G720 Drug-induced myopathy: Secondary | ICD-10-CM

## 2023-11-27 MED ORDER — BUPROPION HCL ER (SR) 150 MG PO TB12: 150.0000 mg | ORAL_TABLET | Freq: Two times a day (BID) | ORAL | 1 refills | Status: DC

## 2023-11-27 MED ORDER — MELOXICAM 15 MG PO TABS: 15.0000 mg | ORAL_TABLET | Freq: Every day | ORAL | 1 refills | Status: DC

## 2023-11-27 MED ORDER — GABAPENTIN 800 MG PO TABS: ORAL_TABLET | ORAL | 1 refills | Status: DC

## 2023-11-27 MED ORDER — CHOLESTYRAMINE 4 G PO PACK: 4.0000 g | PACK | Freq: Three times a day (TID) | ORAL | 3 refills | Status: AC

## 2023-11-27 MED ORDER — OZEMPIC (2 MG/DOSE) 8 MG/3ML ~~LOC~~ SOPN: 2.0000 mg | PEN_INJECTOR | SUBCUTANEOUS | 1 refills | Status: DC

## 2023-11-27 MED ORDER — NORTRIPTYLINE HCL 50 MG PO CAPS: 50.0000 mg | ORAL_CAPSULE | Freq: Every day | ORAL | 1 refills | Status: DC

## 2023-11-27 MED ORDER — VENLAFAXINE HCL ER 150 MG PO CP24: 150.0000 mg | ORAL_CAPSULE | Freq: Every day | ORAL | 1 refills | Status: DC

## 2023-11-27 MED ORDER — SCOPOLAMINE 1 MG/3DAYS TD PT72: 1.0000 | MEDICATED_PATCH | TRANSDERMAL | 0 refills | Status: DC

## 2023-11-27 NOTE — Assessment & Plan Note (Signed)
 Under good control on current regimen. Continue current regimen. Continue to monitor. Call with any concerns. Refills given.

## 2023-11-27 NOTE — Progress Notes (Signed)
 BP 131/80 (BP Location: Left Arm, Patient Position: Sitting, Cuff Size: Normal)   Pulse 83   Ht 5\' 4"  (1.626 m)   Wt 180 lb (81.6 kg)   SpO2 96%   BMI 30.90 kg/m    Subjective:    Patient ID: Carol Becker, female    DOB: August 29, 1960, 63 y.o.   MRN: [id number]  HPI: Carol Becker is a 63 y.o. female presenting on 11/27/2023 for comprehensive medical examination. Current medical complaints include:  DIABETES Hypoglycemic episodes:no Polydipsia/polyuria: no Visual disturbance: no Chest pain: no Paresthesias: no Glucose Monitoring: no Taking Insulin?: no Blood Pressure Monitoring: not checking Retinal Examination: Not up to Date Foot Exam: Up to Date Diabetic Education: Completed Pneumovax: Not up to Date Influenza: Not up to Date Aspirin: yes  HYPERLIPIDEMIA Hyperlipidemia status: stable Satisfied with current treatment?  yes Side effects:  yes Medication compliance: not on anything  Past cholesterol meds: crestor, zetia , atorvastatin Supplements: none Aspirin:  no The 10-year ASCVD risk score (Arnett DK, et al., 2019) is: 6.3%   Values used to calculate the score:     Age: 76 years     Sex: Female     Is Non-Hispanic African American: No     Diabetic: Yes     Tobacco smoker: No     Systolic Blood Pressure: 131 mmHg     Is BP treated: No     HDL Cholesterol: 55 mg/dL     Total Cholesterol: 165 mg/dL Chest pain:  no Coronary artery disease:  yes Family history CAD:  yes  HYPERTHYROIDISM Thyroid  control status:controlled Satisfied with current treatment? yes Medication side effects: no Medication compliance: excellent compliance Recent dose adjustment:no Fatigue: no Cold intolerance: no Heat intolerance: no Weight gain: no Weight loss: no Constipation: yes Diarrhea/loose stools: no Palpitations: no Lower extremity edema: no Anxiety/depressed mood: no  DEPRESSION Mood status: controlled Satisfied with current treatment?: yes Symptom  severity: mild  Duration of current treatment : chronic Side effects: no Medication compliance: excellent compliance Psychotherapy/counseling: no  Previous psychiatric medications: effexor , wellbutrin  Depressed mood: no Anxious mood: yes Anhedonia: no Significant weight loss or gain: yes Insomnia: no  Fatigue: no Feelings of worthlessness or guilt: no Impaired concentration/indecisiveness: no Suicidal ideations: no Hopelessness: no Crying spells: no    10/26/2023    8:27 AM 08/31/2022    3:07 PM 02/24/2022    2:45 PM 11/18/2021   10:08 AM 05/20/2021    8:15 AM  Depression screen PHQ 2/9  Decreased Interest 0 0 0 0 0  Down, Depressed, Hopeless 0 0 0 0 0  PHQ - 2 Score 0 0 0 0 0  Altered sleeping 0 1 0 0 0  Tired, decreased energy 3 0 1 1 2   Change in appetite 0 0 0 0 0  Feeling bad or failure about yourself  0 0 0 0 0  Trouble concentrating 0 0 0 0 0  Moving slowly or fidgety/restless 0 0 0 0 0  Suicidal thoughts 0 0 0 0 0  PHQ-9 Score 3 1 1 1 2   Difficult doing work/chores Not difficult at all Not difficult at all Not difficult at all Not difficult at all     She currently lives with: husband Menopausal Symptoms: no  Depression Screen done today and results listed below:     10/26/2023    8:27 AM 08/31/2022    3:07 PM 02/24/2022    2:45 PM 11/18/2021   10:08 AM 05/20/2021  8:15 AM  Depression screen PHQ 2/9  Decreased Interest 0 0 0 0 0  Down, Depressed, Hopeless 0 0 0 0 0  PHQ - 2 Score 0 0 0 0 0  Altered sleeping 0 1 0 0 0  Tired, decreased energy 3 0 1 1 2   Change in appetite 0 0 0 0 0  Feeling bad or failure about yourself  0 0 0 0 0  Trouble concentrating 0 0 0 0 0  Moving slowly or fidgety/restless 0 0 0 0 0  Suicidal thoughts 0 0 0 0 0  PHQ-9 Score 3 1 1 1 2   Difficult doing work/chores Not difficult at all Not difficult at all Not difficult at all Not difficult at all     Past Medical History:  Past Medical History:  Diagnosis Date   Adrenal  nodule (HCC)    Anxiety    Arthritis    knees, left thumb, shoulders   Breast cancer (HCC)    Left   Cervical spinal stenosis    Family history of breast cancer    GERD (gastroesophageal reflux disease)    History of hypothyroidism    Hyperlipidemia    poss history of   Hyperthyroidism    Hypothyroidism    Osteopenia    PONV (postoperative nausea and vomiting)    after wisdom teeth   Pre-diabetes    Prediabetes    Restless leg syndrome    Sleep apnea    tested ~2012-" lost a lot of weight, no longer has sleep apnea."- per patient   Tremors of nervous system    medication related   Vertigo    Vestibular migraine     Surgical History:  Past Surgical History:  Procedure Laterality Date   ANTERIOR / POSTERIOR COMBINED FUSION CERVICAL SPINE     BREAST BIOPSY Left 03/15/2021   Stereo bx, Ribbon Clip, path pending   BREAST LUMPECTOMY Left    CHOLECYSTECTOMY N/A 09/01/2022   Procedure: LAPAROSCOPIC CHOLECYSTECTOMY;  Surgeon: Lujean Sake, MD;  Location: [facility] OR;  Service: General;  Laterality: N/A;   COLONOSCOPY WITH PROPOFOL  N/A 11/05/2020   Procedure: COLONOSCOPY WITH PROPOFOL ;  Surgeon: Marnee Sink, MD;  Location: [facility];  Service: Endoscopy;  Laterality: N/A;   CYSTECTOMY Left    KNEE ARTHROSCOPY WITH EXCISION BAKER'S CYST Left    LAPAROSCOPY     Exploratory   POLYPECTOMY  11/05/2020   Procedure: POLYPECTOMY;  Surgeon: Marnee Sink, MD;  Location: [facility];  Service: Endoscopy;;   SHOULDER ARTHROSCOPY WITH ROTATOR CUFF REPAIR Left 12/01/2021   Procedure: Left shoulder arthroscopy, debridement, subacromial decompression, distal clavicle resection, chondroplasty;  Surgeon: Ellard Gunning, MD;  Location: [facility];  Service: Orthopedics;  Laterality: Left;    TOTAL MASTECTOMY Bilateral    Per patient "Tummy tissue reconstructive surgery" same time   TOTAL SHOULDER ARTHROPLASTY Left 03/16/2022   Procedure: TOTAL SHOULDER ARTHROPLASTY;  Surgeon:  Ellard Gunning, MD;  Location: [facility];  Service: Orthopedics;  Laterality: Left;   trapeziectomy Left    done at [facility] 06/16/20   TUBAL LIGATION     WISDOM TOOTH EXTRACTION      Medications:  Current Outpatient Medications on File Prior to Visit  Medication Sig   aspirin EC 81 MG tablet Take 81 mg by mouth daily. Swallow whole.   meclizine  (ANTIVERT ) 25 MG tablet Take 1 tablet (25 mg total) by mouth 2 (two) times daily as needed for dizziness.   methimazole  (TAPAZOLE ) 5 MG  tablet Take 1.5 tablets (7.5 mg total) by mouth daily.   Multiple Vitamin (MULTIVITAMIN PO) Take by mouth.   omeprazole  (PRILOSEC) 20 MG capsule Take 20 mg by mouth daily.   No current facility-administered medications on file prior to visit.    Allergies:  Allergies  Allergen Reactions   Crestor [Rosuvastatin Calcium] Other (See Comments)    Indigestion/ increase fatigue   Ezetimibe      Increase fatigue   Sulfa Antibiotics Nausea Only   Zithromax [Azithromycin] Other (See Comments)    Not effective    Social History:  Social History   Socioeconomic History   Marital status: Married    Spouse name: Not on file   Number of children: 2   Years of education: Not on file   Highest education level: 12th grade  Occupational History   Not on file  Tobacco Use   Smoking status: Former    Current packs/day: 0.00    Types: Cigarettes    Start date: 04/05/1981    Quit date: 04/06/2011    Years since quitting: 12.6   Smokeless tobacco: Never  Vaping Use   Vaping status: Former   Quit date: 04/05/2013  Substance and Sexual Activity   Alcohol use: Not Currently    Comment: On Occasion   Drug use: No   Sexual activity: Yes    Birth control/protection: Post-menopausal  Other Topics Concern   Not on file  Social History Narrative   Lives in [place] with husband; quit smoking- 30 years; no alcohol abuse. 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]   Social Drivers of Manufacturing engineer Strain: Patient Declined (10/25/2023)   Overall Financial Resource Strain (CARDIA)    Difficulty of Paying Living Expenses: Patient declined  Food Insecurity: Patient Declined (10/25/2023)   Hunger Vital Sign    Worried About Running Out of Food in the Last Year: Patient declined    Ran Out of Food in the Last Year: Patient declined  Transportation Needs: Patient Declined (10/25/2023)   PRAPARE - Administrator, Civil Service (Medical): Patient declined    Lack of Transportation (Non-Medical): Patient declined  Physical Activity: Unknown (10/25/2023)   Exercise Vital Sign    Days of Exercise per Week: Patient declined    Minutes of Exercise per Session: Not on file  Stress: Patient Declined (10/25/2023)   [employer] of Occupational Health - Occupational Stress Questionnaire    Feeling of Stress : Patient declined  Social Connections: Unknown (10/25/2023)   Social Connection and Isolation Panel [NHANES]    Frequency of Communication with Friends and Family: Patient declined    Frequency of Social Gatherings with Friends and Family: Patient declined    Attends Religious Services: Patient declined    Database administrator or Organizations: Patient declined    Attends Engineer, structural: Not on file    Marital Status: Patient declined  Intimate Partner Violence: Not on file   Social History   Tobacco Use  Smoking Status Former   Current packs/day: 0.00   Types: Cigarettes   Start date: 04/05/1981   Quit date: 04/06/2011   Years since quitting: 12.6  Smokeless Tobacco Never   Social History   Substance and Sexual Activity  Alcohol Use Not Currently   Comment: On Occasion    Family History:  Family History  Problem Relation Age of Onset   Heart disease Father        CAD/VT  Heart disease Sister 24       CAD, multiple stents   Parkinson's disease Sister    Cancer Maternal Grandmother        breast   Breast cancer Maternal  Grandmother 43   Alzheimer's disease Maternal Grandfather    Cancer Paternal Grandmother        breast   Breast cancer Paternal Grandmother 68   Bipolar disorder Daughter    Heart disease Cousin    Breast cancer Cousin        dx 30s-40s, d. 70s-40s    Past medical history, surgical history, medications, allergies, family history and social history reviewed with patient today and changes made to appropriate areas of the chart.   Review of Systems  Constitutional: Negative.   HENT: Negative.         + dry mouth  Eyes: Negative.   Respiratory: Negative.    Cardiovascular:  Positive for palpitations. Negative for chest pain, orthopnea, claudication, leg swelling and PND.  Gastrointestinal:  Positive for constipation and heartburn. Negative for abdominal pain, blood in stool, diarrhea, melena, nausea and vomiting.  Genitourinary: Negative.   Musculoskeletal:  Positive for back pain, myalgias and neck pain. Negative for falls and joint pain.  Skin: Negative.   Neurological:  Positive for dizziness. Negative for tingling, tremors, sensory change, speech change, focal weakness, seizures, loss of consciousness, weakness and headaches.  Endo/Heme/Allergies:  Negative for environmental allergies and polydipsia. Bruises/bleeds easily.  Psychiatric/Behavioral: Negative.     All other ROS negative except what is listed above and in the HPI.      Objective:     BP 131/80 (BP Location: Left Arm, Patient Position: Sitting, Cuff Size: Normal)   Pulse 83   Ht 5\' 4"  (1.626 m)   Wt 180 lb (81.6 kg)   SpO2 96%   BMI 30.90 kg/m   Wt Readings from Last 3 Encounters:  11/27/23 180 lb (81.6 kg)  10/26/23 180 lb (81.6 kg)  09/10/23 191 lb (86.6 kg)    Physical Exam Vitals and nursing note reviewed.  Constitutional:      General: She is not in acute distress.    Appearance: Normal appearance. She is not ill-appearing, toxic-appearing or diaphoretic.  HENT:     Head: Normocephalic and  atraumatic.     Right Ear: Tympanic membrane, ear canal and external ear normal. There is no impacted cerumen.     Left Ear: Tympanic membrane, ear canal and external ear normal. There is no impacted cerumen.     Nose: Nose normal. No congestion or rhinorrhea.     Mouth/Throat:     Mouth: Mucous membranes are moist.     Pharynx: Oropharynx is clear. No oropharyngeal exudate or posterior oropharyngeal erythema.  Eyes:     General: No scleral icterus.       Right eye: No discharge.        Left eye: No discharge.     Extraocular Movements: Extraocular movements intact.     Conjunctiva/sclera: Conjunctivae normal.     Pupils: Pupils are equal, round, and reactive to light.  Neck:     Vascular: No carotid bruit.  Cardiovascular:     Rate and Rhythm: Normal rate and regular rhythm.     Pulses: Normal pulses.     Heart sounds: No murmur heard.    No friction rub. No gallop.  Pulmonary:     Effort: Pulmonary effort is normal. No respiratory distress.     Breath sounds: Normal  breath sounds. No stridor. No wheezing, rhonchi or rales.  Chest:     Chest wall: No tenderness.  Abdominal:     General: Abdomen is flat. Bowel sounds are normal. There is no distension.     Palpations: Abdomen is soft. There is no mass.     Tenderness: There is no abdominal tenderness. There is no right CVA tenderness, left CVA tenderness, guarding or rebound.     Hernia: No hernia is present.  Genitourinary:    Comments: Breast and pelvic exams deferred with shared decision making Musculoskeletal:        General: No swelling, tenderness, deformity or signs of injury.     Cervical back: Normal range of motion and neck supple. No rigidity. No muscular tenderness.     Right lower leg: No edema.     Left lower leg: No edema.  Lymphadenopathy:     Cervical: No cervical adenopathy.  Skin:    General: Skin is warm and dry.     Capillary Refill: Capillary refill takes less than 2 seconds.     Coloration: Skin is  not jaundiced or pale.     Findings: No bruising, erythema, lesion or rash.  Neurological:     General: No focal deficit present.     Mental Status: She is alert and oriented to person, place, and time. Mental status is at baseline.     Cranial Nerves: No cranial nerve deficit.     Sensory: No sensory deficit.     Motor: No weakness.     Coordination: Coordination normal.     Gait: Gait normal.     Deep Tendon Reflexes: Reflexes normal.  Psychiatric:        Mood and Affect: Mood normal.        Behavior: Behavior normal.        Thought Content: Thought content normal.        Judgment: Judgment normal.     Results for orders placed or performed in visit on 10/26/23  Microalbumin, Urine Waived   Collection Time: 10/26/23 10:04 AM  Result Value Ref Range   Microalb, Ur Waived 150 (H) 0 - 19 mg/L   Creatinine, Urine Waived 200 10 - 300 mg/dL   Microalb/Creat Ratio 30-300 (H) <30 mg/g  Bayer DCA Hb A1c Waived   Collection Time: 10/26/23 10:04 AM  Result Value Ref Range   HB A1C (BAYER DCA - WAIVED) 6.2 (H) 4.8 - 5.6 %  CBC with Differential/Platelet   Collection Time: 10/26/23 10:05 AM  Result Value Ref Range   WBC 5.8 3.4 - 10.8 x10E3/uL   RBC 4.46 3.77 - 5.28 x10E6/uL   Hemoglobin 12.8 11.1 - 15.9 g/dL   Hematocrit 40.1 02.7 - 46.6 %   MCV 89 79 - 97 fL   MCH 28.7 26.6 - 33.0 pg   MCHC 32.2 31.5 - 35.7 g/dL   RDW 25.3 66.4 - 40.3 %   Platelets 355 150 - 450 x10E3/uL   Neutrophils 66 Not Estab. %   Lymphs 23 Not Estab. %   Monocytes 9 Not Estab. %   Eos 1 Not Estab. %   Basos 1 Not Estab. %   Neutrophils Absolute 3.8 1.4 - 7.0 x10E3/uL   Lymphocytes Absolute 1.3 0.7 - 3.1 x10E3/uL   Monocytes Absolute 0.5 0.1 - 0.9 x10E3/uL   EOS (ABSOLUTE) 0.1 0.0 - 0.4 x10E3/uL   Basophils Absolute 0.1 0.0 - 0.2 x10E3/uL   Immature Granulocytes 0 Not Estab. %  Immature Grans (Abs) 0.0 0.0 - 0.1 x10E3/uL  Comprehensive metabolic panel with GFR   Collection Time: 10/26/23 10:05 AM   Result Value Ref Range   Glucose 86 70 - 99 mg/dL   BUN 13 8 - 27 mg/dL   Creatinine, Ser 6.96 0.57 - 1.00 mg/dL   eGFR 73 >29 BM/WUX/3.24   BUN/Creatinine Ratio 14 12 - 28   Sodium 138 134 - 144 mmol/L   Potassium 4.6 3.5 - 5.2 mmol/L   Chloride 100 96 - 106 mmol/L   CO2 24 20 - 29 mmol/L   Calcium 9.7 8.7 - 10.3 mg/dL   Total Protein 7.2 6.0 - 8.5 g/dL   Albumin 4.5 3.9 - 4.9 g/dL   Globulin, Total 2.7 1.5 - 4.5 g/dL   Bilirubin Total 0.2 0.0 - 1.2 mg/dL   Alkaline Phosphatase 113 44 - 121 IU/L   AST 35 0 - 40 IU/L   ALT 43 (H) 0 - 32 IU/L  Lipid Panel w/o Chol/HDL Ratio   Collection Time: 10/26/23 10:05 AM  Result Value Ref Range   Cholesterol, Total 165 100 - 199 mg/dL   Triglycerides 98 0 - 149 mg/dL   HDL 55 >40 mg/dL   VLDL Cholesterol Cal 18 5 - 40 mg/dL   LDL Chol Calc (NIH) 92 0 - 99 mg/dL  Thyroid  Panel With TSH   Collection Time: 10/26/23 10:05 AM  Result Value Ref Range   TSH 0.653 0.450 - 4.500 uIU/mL   T4, Total 6.3 4.5 - 12.0 ug/dL   T3 Uptake Ratio 26 24 - 39 %   Free Thyroxine Index 1.6 1.2 - 4.9  VITAMIN D  25 Hydroxy (Vit-D Deficiency, Fractures)   Collection Time: 10/26/23 10:05 AM  Result Value Ref Range   Vit D, 25-Hydroxy 51.9 30.0 - 100.0 ng/mL      Assessment & Plan:   Problem List Items Addressed This Visit       Endocrine   Hyperthyroidism   Continue to follow with endocrinology. Labs drawn today. Call with any concerns.       Relevant Orders   CBC with Differential/Platelet   Comprehensive metabolic panel with GFR   TSH   Type 2 diabetes mellitus with neurological complications (HCC)   Doing great with A1c of 5.3. Continue current regimen. Continue to monitor. Call with any concerns.       Relevant Medications   Semaglutide , 2 MG/DOSE, (OZEMPIC , 2 MG/DOSE,) 8 MG/3ML SOPN   Other Relevant Orders   Microalbumin, Urine Waived   Bayer DCA Hb A1c Waived   CBC with Differential/Platelet   Comprehensive metabolic panel with GFR      Musculoskeletal and Integument   Statin myopathy   Unable to tolerate statins.         Other   Hyperlipidemia   Under good control on current regimen. Continue current regimen. Continue to monitor. Call with any concerns. Refills given. Labs drawn today.        Relevant Medications   cholestyramine  (QUESTRAN ) 4 g packet   Other Relevant Orders   CBC with Differential/Platelet   Comprehensive metabolic panel with GFR   Lipid Panel w/o Chol/HDL Ratio   GAD (generalized anxiety disorder)   Under good control on current regimen. Continue current regimen. Continue to monitor. Call with any concerns. Refills given.        Relevant Medications   buPROPion  (WELLBUTRIN  SR) 150 MG 12 hr tablet   nortriptyline  (PAMELOR ) 50 MG capsule   venlafaxine  XR (  EFFEXOR -XR) 150 MG 24 hr capsule   Other Visit Diagnoses       Routine general medical examination at a health care facility    -  Primary   Vaccines up to date/declined. Screening labs checked today. Pap up to date. Mammo N/A. Colonoscopy up to date. Continue diet and exercise. Call with concerns.   Relevant Orders   Microalbumin, Urine Waived   Bayer DCA Hb A1c Waived   CBC with Differential/Platelet   Comprehensive metabolic panel with GFR   Lipid Panel w/o Chol/HDL Ratio   TSH   VITAMIN D  25 Hydroxy (Vit-D Deficiency, Fractures)     Vitamin D  deficiency       Labs drawn today. Await results.   Relevant Orders   CBC with Differential/Platelet   Comprehensive metabolic panel with GFR   VITAMIN D  25 Hydroxy (Vit-D Deficiency, Fractures)     Memory change       Has been trying to get into see neuropsych for several months. We will place new referral for her.   Relevant Orders   Ambulatory referral to Neuropsychology     Personal history of tobacco use, presenting hazards to health       Due for CT lung- ordered today.   Relevant Orders   CT CHEST LUNG CA SCREEN LOW DOSE W/O CM        Follow up plan: Return in about 6  months (around 05/29/2024).   LABORATORY TESTING:  - Pap smear: pap done  IMMUNIZATIONS:   - Tdap: Tetanus vaccination status reviewed: last tetanus booster within 10 years. - Influenza: Postponed to flu season - Pneumovax: Up to date - Prevnar: Refused - COVID: Refused - HPV: Not applicable - Shingrix  vaccine: Refused  SCREENING: -Mammogram: Not applicable  - Colonoscopy: Up to date   PATIENT COUNSELING:   Advised to take 1 mg of folate supplement per day if capable of pregnancy.   Sexuality: Discussed sexually transmitted diseases, partner selection, use of condoms, avoidance of unintended pregnancy  and contraceptive alternatives.   Advised to avoid cigarette smoking.  I discussed with the patient that most people either abstain from alcohol or drink within safe limits (<=14/week and <=4 drinks/occasion for males, <=7/weeks and <= 3 drinks/occasion for females) and that the risk for alcohol disorders and other health effects rises proportionally with the number of drinks per week and how often a drinker exceeds daily limits.  Discussed cessation/primary prevention of drug use and availability of treatment for abuse.   Diet: Encouraged to adjust caloric intake to maintain  or achieve ideal body weight, to reduce intake of dietary saturated fat and total fat, to limit sodium intake by avoiding high sodium foods and not adding table salt, and to maintain adequate dietary potassium and calcium preferably from fresh fruits, vegetables, and low-fat dairy products.    stressed the importance of regular exercise  Injury prevention: Discussed safety belts, safety helmets, smoke detector, smoking near bedding or upholstery.   Dental health: Discussed importance of regular tooth brushing, flossing, and dental visits.    NEXT PREVENTATIVE PHYSICAL DUE IN 1 YEAR. Return in about 6 months (around 05/29/2024).

## 2023-11-27 NOTE — Assessment & Plan Note (Signed)
Doing great with A1c of 5.3. Continue current regimen. Continue to monitor. Call with any concerns.

## 2023-11-27 NOTE — Patient Instructions (Addendum)
 Dr. Leaf[facility] [address], [place], [place] [address] Phone: [phone]  [facility] [address] [place], [place] [address] [phone]

## 2023-11-27 NOTE — Assessment & Plan Note (Signed)
 Continue to follow with endocrinology. Labs drawn today. Call with any concerns.

## 2023-11-27 NOTE — Assessment & Plan Note (Signed)
Unable to tolerate statins 

## 2023-11-27 NOTE — Assessment & Plan Note (Signed)
 Under good control on current regimen. Continue current regimen. Continue to monitor. Call with any concerns. Refills given. Labs drawn today.

## 2023-11-28 ENCOUNTER — Other Ambulatory Visit: Payer: Self-pay

## 2023-11-28 DIAGNOSIS — Z122 Encounter for screening for malignant neoplasm of respiratory organs: Secondary | ICD-10-CM

## 2023-11-28 DIAGNOSIS — Z87891 Personal history of nicotine dependence: Secondary | ICD-10-CM

## 2023-11-28 LAB — TSH: TSH: 4.98 u[IU]/mL — ABNORMAL HIGH (ref 0.450–4.500)

## 2023-11-28 LAB — COMPREHENSIVE METABOLIC PANEL WITH GFR
ALT: 17 IU/L (ref 0–32)
AST: 24 IU/L (ref 0–40)
Albumin: 4.5 g/dL (ref 3.9–4.9)
Alkaline Phosphatase: 122 IU/L — ABNORMAL HIGH (ref 44–121)
BUN/Creatinine Ratio: 14 (ref 12–28)
BUN: 13 mg/dL (ref 8–27)
Bilirubin Total: 0.3 mg/dL (ref 0.0–1.2)
CO2: 23 mmol/L (ref 20–29)
Calcium: 9.3 mg/dL (ref 8.7–10.3)
Chloride: 100 mmol/L (ref 96–106)
Creatinine, Ser: 0.91 mg/dL (ref 0.57–1.00)
Globulin, Total: 2.8 g/dL (ref 1.5–4.5)
Glucose: 85 mg/dL (ref 70–99)
Potassium: 3.7 mmol/L (ref 3.5–5.2)
Sodium: 138 mmol/L (ref 134–144)
Total Protein: 7.3 g/dL (ref 6.0–8.5)
eGFR: 72 mL/min/{1.73_m2} (ref >59)

## 2023-11-28 LAB — MICROALBUMIN, URINE WAIVED
Creatinine, Urine Waived: 50 mg/dL (ref 10–300)
Microalb, Ur Waived: 10 mg/L (ref 0–19)

## 2023-11-28 LAB — CBC WITH DIFFERENTIAL/PLATELET
Basophils Absolute: 0.1 10*3/uL (ref 0.0–0.2)
Basos: 1 %
EOS (ABSOLUTE): 0.1 10*3/uL (ref 0.0–0.4)
Eos: 2 %
Hematocrit: 38.8 % (ref 34.0–46.6)
Hemoglobin: 12.6 g/dL (ref 11.1–15.9)
Immature Grans (Abs): 0 10*3/uL (ref 0.0–0.1)
Immature Granulocytes: 0 %
Lymphocytes Absolute: 2 10*3/uL (ref 0.7–3.1)
Lymphs: 28 %
MCH: 29 pg (ref 26.6–33.0)
MCHC: 32.5 g/dL (ref 31.5–35.7)
MCV: 89 fL (ref 79–97)
Monocytes Absolute: 0.6 10*3/uL (ref 0.1–0.9)
Monocytes: 8 %
Neutrophils Absolute: 4.4 10*3/uL (ref 1.4–7.0)
Neutrophils: 61 %
Platelets: 306 10*3/uL (ref 150–450)
RBC: 4.35 x10E6/uL (ref 3.77–5.28)
RDW: 14.3 % (ref 11.7–15.4)
WBC: 7.1 10*3/uL (ref 3.4–10.8)

## 2023-11-28 LAB — LIPID PANEL W/O CHOL/HDL RATIO
Cholesterol, Total: 221 mg/dL — ABNORMAL HIGH (ref 100–199)
HDL: 61 mg/dL
LDL Chol Calc (NIH): 134 mg/dL — ABNORMAL HIGH (ref 0–99)
Triglycerides: 147 mg/dL (ref 0–149)
VLDL Cholesterol Cal: 26 mg/dL (ref 5–40)

## 2023-11-28 LAB — VITAMIN D 25 HYDROXY (VIT D DEFICIENCY, FRACTURES): Vit D, 25-Hydroxy: 42.1 ng/mL (ref 30.0–100.0)

## 2023-11-28 LAB — BAYER DCA HB A1C WAIVED: HB A1C (BAYER DCA - WAIVED): 5.3 % (ref 4.8–5.6)

## 2023-11-30 ENCOUNTER — Ambulatory Visit: Payer: Self-pay | Admitting: Family Medicine

## 2023-12-06 ENCOUNTER — Telehealth: Payer: Self-pay | Admitting: Adult Health

## 2023-12-06 ENCOUNTER — Encounter: Payer: Self-pay | Admitting: Psychology

## 2023-12-06 NOTE — Telephone Encounter (Signed)
 Pt called to find out where referral was sent . Informed Pt referral was sent to  [facility] Phone Number [phone]

## 2023-12-14 ENCOUNTER — Ambulatory Visit
Admission: RE | Admit: 2023-12-14 | Discharge: 2023-12-14 | Disposition: A | Discharge status: Home / Self Care | Source: Ambulatory Visit | Attending: Family Medicine | Admitting: Family Medicine

## 2023-12-14 DIAGNOSIS — Z122 Encounter for screening for malignant neoplasm of respiratory organs: Secondary | ICD-10-CM

## 2023-12-14 DIAGNOSIS — Z87891 Personal history of nicotine dependence: Secondary | ICD-10-CM

## 2023-12-21 ENCOUNTER — Encounter: Payer: Self-pay | Admitting: Internal Medicine

## 2023-12-21 ENCOUNTER — Ambulatory Visit: Payer: Managed Care, Other (non HMO) | Admitting: Internal Medicine

## 2023-12-21 VITALS — BP 120/80 | HR 84 | Ht 64.0 in | Wt 179.0 lb

## 2023-12-21 DIAGNOSIS — E0590 Thyrotoxicosis, unspecified without thyrotoxic crisis or storm: Secondary | ICD-10-CM

## 2023-12-21 DIAGNOSIS — D3502 Benign neoplasm of left adrenal gland: Secondary | ICD-10-CM

## 2023-12-21 DIAGNOSIS — E0500 Thyrotoxicosis with diffuse goiter without thyrotoxic crisis or storm: Secondary | ICD-10-CM | POA: Diagnosis not present

## 2023-12-21 MED ORDER — DEXAMETHASONE 1 MG PO TABS: 1.0000 mg | ORAL_TABLET | Freq: Once | ORAL | 0 refills | Status: AC

## 2023-12-21 MED ORDER — METHIMAZOLE 5 MG PO TABS: 7.5000 mg | ORAL_TABLET | ORAL | 2 refills | Status: DC

## 2023-12-21 NOTE — Progress Notes (Unsigned)
 Name: Carol Becker  MRN/ DOB: [id number], [birth year]    Age/ Sex: 63 y.o., female    PCP: Solomon Dupre, DO   Reason for Endocrinology Evaluation: Hyperthyroidism     Date of Initial Endocrinology Evaluation: 12/21/2023     HPI: [name] is a 63 y.o. 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 The patient presented for initial endocrinology clinic visit on 12/21/2023 for consultative assistance with her Hyperthyroidism.   Patient has been diagnosed with hypothyroidism > 15 yrs ago , she was started on LT-for replacement 25 mcg daily and stayed on it for years.  She discontinued LT-for replacement approximately 2021 due to side effects.  By November, 2021 the patient has been noted with hyperthyroidism and was started on thionamides therapy.  No prior XRT Granddaughter with thyroid  disease  TRAb elevated     ADRENAL HISTORY:  During evaluation for breast cancer in 03/2021 she was noted with adrenal gland adenoma on CT imaging.  CT imaging 08/2021 showed a 1.5 cm indeterminant left adrenal nodule.   Of note, the patient follows with neurology and has chronic vertigo/vestibular migraines  Patient transitioned care from [facility]   SUBJECTIVE:    Today (12/21/23): [name] is here for follow-up on hyperthyroidism and adrenal adenoma.  Patient follows with [facility] oncology for left breast ductal carcinoma in situ  Denies local neck swelling  Weight has been trending down she is on Ozempic   Has mild tremors  Denies  palpitations Denies eye symptoms  Continues with constipation - on fiber  Bp well controlled  No biotin  No eye symptoms   The patient does endorse results of flushing around the chest and face associated with sweating and near syncope, these episodes resolve spontaneously.  BP at the time is normal as well as BG  reading    Methimazole  5 mg, 1.5 tabs daily    HISTORY:  Past Medical History:  Past Medical History:  Diagnosis Date   Adrenal nodule (HCC)    Anxiety    Arthritis    knees, left thumb, shoulders   Breast cancer (HCC)    Left   Cervical spinal stenosis    Family history of breast cancer    GERD (gastroesophageal reflux disease)    History of hypothyroidism    Hyperlipidemia    poss history of   Hyperthyroidism    Hypothyroidism    Osteopenia    PONV (postoperative nausea and vomiting)    after wisdom teeth   Pre-diabetes    Prediabetes    Restless leg syndrome    Sleep apnea    tested ~2012- lost a lot of weight, no longer has sleep apnea.- per patient   Tremors of nervous system    medication related   Vertigo    Vestibular migraine    Past Surgical History:  Past Surgical History:  Procedure Laterality Date   ANTERIOR / POSTERIOR COMBINED FUSION CERVICAL SPINE     BREAST BIOPSY Left 03/15/2021   Stereo bx, Ribbon Clip, path pending   BREAST LUMPECTOMY Left    CHOLECYSTECTOMY N/A 09/01/2022   Procedure: LAPAROSCOPIC CHOLECYSTECTOMY;  Surgeon: Lujean Sake, MD;  Location: [facility] OR;  Service: General;  Laterality: N/A;   COLONOSCOPY WITH PROPOFOL  N/A 11/05/2020   Procedure: COLONOSCOPY WITH PROPOFOL ;  Surgeon: Marnee Sink, MD;  Location: [facility];  Service: Endoscopy;  Laterality: N/A;   CYSTECTOMY Left    KNEE ARTHROSCOPY WITH EXCISION BAKER'S CYST Left    LAPAROSCOPY     Exploratory   POLYPECTOMY  11/05/2020   Procedure: POLYPECTOMY;  Surgeon: Marnee Sink, MD;  Location: [facility];  Service: Endoscopy;;   SHOULDER ARTHROSCOPY WITH ROTATOR CUFF REPAIR Left 12/01/2021   Procedure: Left shoulder arthroscopy, debridement, subacromial decompression, distal clavicle resection, chondroplasty;  Surgeon: Ellard Gunning, MD;  Location: [facility];  Service: Orthopedics;  Laterality: Left;    TOTAL MASTECTOMY Bilateral    Per patient Tummy  tissue reconstructive surgery same time   TOTAL SHOULDER ARTHROPLASTY Left 03/16/2022   Procedure: TOTAL SHOULDER ARTHROPLASTY;  Surgeon: Ellard Gunning, MD;  Location: [facility];  Service: Orthopedics;  Laterality: Left;   trapeziectomy Left    done at [facility] 06/16/20   TUBAL LIGATION     WISDOM TOOTH EXTRACTION      Social History:  reports that she quit smoking about 12 years ago. Her smoking use included cigarettes. She started smoking about 42 years ago. She has never used smokeless tobacco. She reports that she does not currently use alcohol. She reports that she does not use drugs. Family History: family history includes Alzheimer's disease in her maternal grandfather; Bipolar disorder in her daughter; Breast cancer in her cousin; Breast cancer (age of onset: 26) in her paternal grandmother; Breast cancer (age of onset: 13) in her maternal grandmother; Cancer in her maternal grandmother and paternal grandmother; Heart disease in her cousin and father; Heart disease (age of onset: 34) in her sister; Parkinson's disease in her sister.   HOME MEDICATIONS: Allergies as of 12/21/2023       Reactions   Crestor [rosuvastatin Calcium] Other (See Comments)   Indigestion/ increase fatigue   Ezetimibe     Increase fatigue   Sulfa Antibiotics Nausea Only   Zithromax [azithromycin] Other (See Comments)   Not effective        Medication List        Accurate as of December 21, 2023  8:52 AM. If you have any questions, ask your nurse or doctor.          aspirin EC 81 MG tablet Take 81 mg by mouth daily. Swallow whole.   buPROPion  150 MG 12 hr tablet Commonly known as: Wellbutrin  SR Take 1 tablet (150 mg total) by mouth 2 (two) times daily.   cholestyramine  4 g packet Commonly known as: Questran  Take 1 packet (4 g total) by mouth 3 (three) times daily with meals.   gabapentin  800 MG tablet Commonly known as: NEURONTIN  Take 1 pill in the AM and 2 pills in the PM   meclizine  25 MG  tablet Commonly known as: ANTIVERT  Take 1 tablet (25 mg total) by mouth 2 (two) times daily as needed for dizziness.   meloxicam  15 MG tablet Commonly known as: MOBIC  Take 1 tablet (15 mg total) by mouth daily.   methimazole  5 MG tablet Commonly known as: TAPAZOLE  Take 1.5 tablets (7.5 mg total) by mouth daily.   MULTIVITAMIN PO Take by mouth.   nortriptyline  50 MG capsule Commonly known as: PAMELOR  Take 1 capsule (50 mg total) by mouth at bedtime.   omeprazole  20 MG capsule Commonly known as: PRILOSEC Take 20 mg by mouth daily.   Ozempic  (2 MG/DOSE) 8 MG/3ML Sopn Generic drug: Semaglutide  (2 MG/DOSE) Inject 2 mg into the skin once a week.   scopolamine  1 MG/3DAYS Commonly known as: TRANSDERM-SCOP Place  1 patch (1.5 mg total) onto the skin every 3 (three) days.   venlafaxine  XR 150 MG 24 hr capsule Commonly known as: EFFEXOR -XR Take 1 capsule (150 mg total) by mouth at bedtime.          REVIEW OF SYSTEMS: A comprehensive ROS was conducted with the patient and is negative except as per HPI     OBJECTIVE:  VS: BP 120/80 (BP Location: Left Arm, Patient Position: Sitting, Cuff Size: Normal)   Pulse 84   Ht 5' 4 (1.626 m)   Wt 179 lb (81.2 kg)   SpO2 96%   BMI 30.73 kg/m    Wt Readings from Last 3 Encounters:  12/21/23 179 lb (81.2 kg)  11/27/23 180 lb (81.6 kg)  10/26/23 180 lb (81.6 kg)     EXAM: General: Pt appears well and is in NAD  Neck: General: Supple without adenopathy. Thyroid : Thyroid  size normal.  No goiter or nodules appreciated.  Lungs: Clear with good BS bilat   Heart: Auscultation: RRR.  Abdomen: soft, nontender  Extremities:  BL LE: No pretibial edema   Mental Status: Judgment, insight: Intact Orientation: Oriented to time, place, and person Mood and affect: No depression, anxiety, or agitation     DATA REVIEWED: 12/21/23 09:05 Potassium: 4.1    Latest Reference Range & Units 11/27/23 16:29  Sodium 134 - 144 mmol/L 138   Potassium 3.5 - 5.2 mmol/L 3.7  Chloride 96 - 106 mmol/L 100  CO2 20 - 29 mmol/L 23  Glucose 70 - 99 mg/dL 85  BUN 8 - 27 mg/dL 13  Creatinine 1.61 - 0.96 mg/dL 0.45  Calcium 8.7 - 40.9 mg/dL 9.3  BUN/Creatinine Ratio 12 - 28  14  eGFR >59 mL/min/1.73 72  Alkaline Phosphatase 44 - 121 IU/L 122 (H)  Albumin 3.9 - 4.9 g/dL 4.5  AST 0 - 40 IU/L 24  ALT 0 - 32 IU/L 17  Total Protein 6.0 - 8.5 g/dL 7.3  Total Bilirubin 0.0 - 1.2 mg/dL 0.3    Latest Reference Range & Units 11/27/23 16:29  TSH 0.450 - 4.500 uIU/mL 4.980 (H)      Old records , labs and images have been reviewed.    ASSESSMENT/PLAN/RECOMMENDATIONS:   Hyperthyroidism:  -Secondary to Graves' disease - Pt is clinically euthyroid  -No local neck swelling  -I have attempted to decrease methimazole  from 1.5 tabs to 1 tablet daily, but unfortunately her TSH suppressed to 0.04 uIU/mL by  01/2023 -Most recent TSH is elevated, will decrease methimazole  as below - Will recheck TFTs in 2 months    Medications : Continue  methimazole  5 mg, 1.5 tab Monday through Friday, 1 tablet Saturdays and Sundays  2. Left adrenal Adenoma :  -Adrenal imaging shows stability in 2023 -Aldo, renin, Aldo/renin ratio, catecholamines and 24-hour urinary cortisol where normal in 2024 - The patient with episodes of flushing, excessive sweating in the chest/face area, will proceed with catecholamine testing - Aldo/renin pending today -Patient will return in 2 months for dexamethasone  suppression test  3.  Graves' disease:   -No extrathyroidal manifestations of Graves' disease   F/U in 6 months    Signed electronically by: Natale Bail, MD  [facility] Endocrinology  [facility] [address] [place] [place], [place] [address] Phone: [phone] FAX: [phone]   CC: Davis Esters [address] [place] [address] Phone: [phone] Fax: [phone]   Return to Endocrinology clinic as  below: Future Appointments  Date Time Provider Department Center  01/10/2024 10:00 AM Aleene Hurry [facility] CPR  02/20/2024 11:00 AM Constancia Delton, MD [facility] None  03/13/2024  7:45 AM Johny Nap, NP [facility] None  05/30/2024  8:20 AM Solomon Dupre, DO [facility]

## 2023-12-21 NOTE — Patient Instructions (Addendum)
 Take methimazole  5 mg, 1 tablet on Saturday and Sunday and 1.5 tablets Monday through Friday   Instructions for Dexamethasone  Suppression Test   Step 1: Choose a morning when you can come to our lab at 8:00 am for a blood draw.   Step 2: On the night before the blood draw, take one 1 mg tablet of dexamethasone  at 11:30 pm.  The timing is VERY important!   Step 3: The next morning, go to the lab for blood work at 8:00 am.  Carol Griffes do not have to be on an empty stomach, but the timing is VERY important!

## 2023-12-25 ENCOUNTER — Ambulatory Visit: Payer: Self-pay | Admitting: Internal Medicine

## 2023-12-25 DIAGNOSIS — R825 Elevated urine levels of drugs, medicaments and biological substances: Secondary | ICD-10-CM

## 2023-12-28 LAB — ALDOSTERONE + RENIN ACTIVITY W/ RATIO
ALDO / PRA Ratio: 3.6 ratio (ref 0.9–28.9)
Aldosterone: 5 ng/dL
Renin Activity: 1.38 ng/mL/h (ref 0.25–5.82)

## 2023-12-31 ENCOUNTER — Other Ambulatory Visit: Payer: Self-pay

## 2023-12-31 DIAGNOSIS — Z122 Encounter for screening for malignant neoplasm of respiratory organs: Secondary | ICD-10-CM

## 2023-12-31 DIAGNOSIS — Z87891 Personal history of nicotine dependence: Secondary | ICD-10-CM

## 2024-01-01 LAB — POTASSIUM: Potassium: 4.1 mmol/L (ref 3.5–5.3)

## 2024-01-01 LAB — CATECHOLAMINES, FRACTIONATED, PLASMA
Dopamine: 35 pg/mL — ABNORMAL HIGH
Epinephrine: 54 pg/mL
Norepinephrine: 987 pg/mL — ABNORMAL HIGH
Total Catecholamines: 1076 pg/mL — ABNORMAL HIGH

## 2024-01-04 NOTE — Telephone Encounter (Signed)
 Please contact the pt and schedule her for a lab appointment.      A portal message was sent to the pt    Thanks

## 2024-01-04 NOTE — Progress Notes (Signed)
 Pt has a lab appt on 01/16/24 at 8:00,do I need to schedule another one?

## 2024-01-10 ENCOUNTER — Encounter: Attending: Psychology | Admitting: Psychology

## 2024-01-10 DIAGNOSIS — F419 Anxiety disorder, unspecified: Secondary | ICD-10-CM | POA: Diagnosis not present

## 2024-01-10 DIAGNOSIS — R413 Other amnesia: Secondary | ICD-10-CM | POA: Diagnosis present

## 2024-01-10 NOTE — Progress Notes (Signed)
 NEUROPSYCHOLOGICAL EVALUATION [place]  Physical Medicine and Rehabilitation     Patient: Carol Becker  MRN: [id number] DOB: 1960/10/01  Age: 63 y.o. Sex: female  Race/Ethnicity: White or Caucasian  Years of Education: 12 Handedness: Right  Referring Provider: Vicci Duwaine SQUIBB, DO  Collateral information source: [name])  Provider/Clinical Neuropsychologist: Evalene DOROTHA Riff, PsyD  Date of Service: 01/10/2024 Start Time: 10 AM End Time: 12 PM  Location of Service:  [facility] Physical Medicine & Rehabilitation Department [place]. [facility] [address], [place]. [address] [place], [place] [address] Phone: [phone]  Billing Code/Service:            96116/96121  Individuals Present: Patient was seen, accompanied by her spouse with her permission, in-person, by the provider. 1 hour and 15 minutes spent in face-to-face clinical interview and remaining 45 minutes was spent in record review, documentation, and testing protocol construction.    PATIENT CONSENT AND CONFIDENTIALITY The patient's understanding of the reason for referral was intact. Discussed limits of confidentiality including, but not limited to, posting of final evaluation report in the patient's electronic medical record for both the patient and for the referring provider and appropriate medical professionals. Patient was given the opportunity to have their questions answered. The neuropsychological evaluation process was discussed with the patient and they consented to proceed with the evaluation.  Consent for Evaluation and Treatment: Signed: Yes Explanation of Privacy Policies: Signed: Yes Discussion of Confidentiality Limits: Yes  REASON FOR REFERRAL:The patient was referred by neurology for neuropsychological evaluation by her primary care provider (Dr. Vicci) due to concerns for difficulty with short term memory and word-finding. The patient was referred to  neurology due to headaches and vertigo and seen on 03/07/2022. She was diagnosed with vestibular migraine.  Neurology notes indicated patient report of intermittent disorientation and getting lost while driving in [address]. Records indicated patient report of worsening cognitive symptoms (9/24), MMSE 30/30. Word finding difficulty was reported in that visit as well. Patient reports history of AD in both grandparents and mother. 03/13/23 ATN profile was not suggestive of AD related pathology.  APOE genotype results (03/13/23) were negative for increased risk AD risk (E3/E3 genotype). B12 lab results from 04/12/23 were reportedly normal. In a neurology follow up visit (09/10/23) notes state that she continues to experience memory difficulties (losing train of thought, word-finding).  Clinic notes also indicated she has been having increased nausea and vertigo.  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  Upon interview, the patient indicated an interest in undergoing the evaluation to understand the nature of her memory difficulties and what to expect in the future.  HISTORY OF PRESENTING CONCERNS: The following was obtained from the patient and collateral (spouse) via clinical interview.  Cognitive Symptom Onset & Course: Gradual onset of cognitive difficulties around 2023 and progressive course, particularly within the last year. Notable events include 9 surgeries within 11 months, most occurring within 2023. She described significant work related stress that has been increased since early 2025.     Current Cognitive Complaints:  Memory: Increased difficulty misplacing things.  Generally remembers medications without issues but on rare occasion will forget whether she has taken a medication already that day.  No significant difficulties remembering conversations or recent events.  No significant unintentional repeating statements or  questions. Reports feeling lost about every time I go somewhere until seeing something familiar.  Processing Speed: Endorsement of  slowed processing speed, which can reportedly be a source of anxiety.  Attention & Concentration: Described frequently losing train of thought. Difficulties with internal distraction thinking ahead rather than distraction due to extraneous/external stimuli.  Language: Only occasional word finding difficulties. Some increased occurrence of mis-naming/paraphasic errors, but happens only once a week on average. No significant difficulties with receptive language. No difficulties with basic writing composition. Relies on calculator increasingly.   Visual-Spatial: No clear indications of impairments in visual-spatial abilities excluding occasional geographic disorientation.   Executive Functioning: Reduced frustration tolerance (she will give up on a task... lose concentration... more easily frustrated) as of at least one year ago. She described herself as being more emotional relative to her historical baseline. She described significant frustration with her cognitive difficulties. This has been present more so over the last year. No marked impulsivity, impairments in judgment, or clear indicatorssuggestive of personality changes / social disinhibition.   Motor/Sensory Complaints:   Sensory changes: No significant difficulties with sense of smell, taste, hearing, vision. Balance/coordination difficulties: Some balance difficulties approximately once per week. Frequent instances of dizziness/vertigo: Some dizziness and vertigo, and nausea, which has improved since 2023.  Other motor difficulties: None.   Emotional and Behavioral Functioning:  Depression: Denied significant depressive symptoms at present. Acknowledged maybe slight mood related difficulties.  Anxiety: Current and long-running difficulties with anxiety. Symptoms include worry, difficulty relaxing, feeling  tense. Difficulties are slightly increased relative to 10 years ago.  Other: The patient describes significant stress related to work and feeling overwhelmed.  She recognizes some contributions to her perfectionism and her stress. Has no history of engaging with individual therapy.   Sleep: Sleeps 6-8 hours per night. Feels rested in morning. No marked difficulties with onset or maintenance. Some talking in her sleep, but very rarely (once every couple years). Has been diagnosed with sleep apnea (10-12 years ago). Was on cpap for about five years. Not  Appetite: Stopped taking Ozempic  two weeks prior to date of the interview. Reported that she was on this for about three years and stopped taking it because of fatigue.  Caffeine: None.  Alcohol Use: No current alcohol consumption. Did have a history of heavy alcohol use.  Tobacco Use: Quit smoking around 12 years ago. Smoked 2 packs per day. Smoked for 30 years.  Recreational Substance Use: None.    Level of Functional Independence: The patient is intact with basic and instrumental activities of daily living.   Medical History/Record Review:  History of traumatic brain injury/concussion: Brief LOC from head injury around 2 or 63 years of age.   History of stroke: No   History of heart attack: No   History of cancer/chemotherapy:No chemo or radiation of the head   History of seizure activity: None   Symptoms of chronic pain: 2/10 in severity.   Experience of frequent headaches/migraines: None     Past Medical History:  Diagnosis Date   Adrenal nodule (HCC)    Anxiety    Arthritis    knees, left thumb, shoulders   Breast cancer (HCC)    Left   Cervical spinal stenosis    Family history of breast cancer    GERD (gastroesophageal reflux disease)    History of hypothyroidism    Hyperlipidemia    poss history of   Hyperthyroidism    Hypothyroidism    Osteopenia    PONV (postoperative nausea and vomiting)    after wisdom teeth    Pre-diabetes    Prediabetes  Restless leg syndrome    Sleep apnea    tested ~2012- lost a lot of weight, no longer has sleep apnea.- per patient   Tremors of nervous system    medication related   Vertigo    Vestibular migraine    Patient Active Problem List   Diagnosis Date Noted   Statin myopathy 11/27/2023   Graves disease 12/08/2022   Aortic atherosclerosis (HCC) 11/28/2022   CAD (coronary artery disease) 11/28/2022   COPD (chronic obstructive pulmonary disease) (HCC) 11/28/2022   Adrenal adenoma, left 08/09/2022   RLS (restless legs syndrome) 11/21/2021   Family history of breast cancer 04/12/2021   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/2021   Adhesive capsulitis of left shoulder 03/04/2021   Vestibular migraine 11/12/2020   Posterior vitreous detachment of both eyes 11/12/2020   Polyp of ascending colon    Pain in finger of left hand 08/30/2020   Vertiginous migraine 08/14/2020   Status post cervical spinal fusion 08/03/2020   Phonophobia 04/05/2020   Benign paroxysmal positional vertigo 04/05/2020   GAD (generalized anxiety disorder) 03/23/2020   Drug-induced parkinsonism (HCC) 03/23/2020   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   Prolapsed cervical intervertebral disc 01/21/2019   Rheumatoid factor positive 12/23/2018   Primary osteoarthritis of first carpometacarpal joint of left hand 09/01/2018   Pain in joints of left hand 09/01/2018   Chronic pain 01/07/2018   DJD (degenerative joint disease), cervical 12/19/2016   Controlled substance agreement signed 06/13/2016   Hyperlipidemia 03/23/2015   Hyperthyroidism 03/23/2015   Ulnar neuropathy 12/24/2014   Carpal tunnel syndrome 09/15/2014   Imaging/Lab Results:  03/13/23  "ATN SUMMARY Comment Comment:                        A- T- N- A normal beta-amyloid 42/40 ratio and normal concentrations of pTau181 and NfL were observed at this  time. These results are not consistent with the presence of Alzheimer's- related pathology. Plasma findings may be less precise than CSF or PET. Additional assessments may be necessary. These tests are intended to be used only in the context of clinical care."   EXAM: MRI brain without contrast  ... STUDY DATE: 09/26/2023  ... COMPARISON FILMS: CT head without 12/27/2004  ... FINDINGS:  The brain parenchyma shows mild age-related changes of chronic small vessel disease.  There is mild degree of generalized cerebral atrophy which is likely age disproportionate.  No structural lesion, tumor or infarct is noted.  Diffusion-weighted imaging is negative for acute ischemia.  SWI images do not show microhemorrhages.  Subarachnoid spaces and ventricular system show slight dilatation.  Extra-axial brain structures appear normal.  Calvarium shows no abnormalities.  Orbits appear unremarkable.  Paranasal sinuses show mild chronic mucosal thickening.  The pituitary gland and cerebellar tonsils appear normal.  Visualized portion of the cervical spine shows only minor disc degenerative changes.  Flow-voids of large vessels are intact and circulation appear to be patent   IMPRESSION: MRI scan of the brain without contrast showing mild age advanced changes of generalized cerebral atrophy.  No acute abnormalities.  09/03/2020 EXAM: MRI HEAD WITHOUT AND WITH CONTRAST COMPARISON: CT head December 27, 2004.  FINDINGS: Brain: No acute infarction, hemorrhage, hydrocephalus, extra-axial collection or mass lesion. Mild scattered T2/FLAIR hyperintensities within the white matter, most likely related to chronic microvascular ischemic disease and within normal limits for patient age. No abnormal enhancement. IMPRESSION: No evidence of acute  intracranial abnormality.  No acute infarct.  Family Neurologic/Medical Hx:  Family History  Problem Relation Age of Onset   Heart disease Father        CAD/VT   Heart  disease Sister 56       CAD, multiple stents   Parkinson's disease Sister    Cancer Maternal Grandmother        breast   Breast cancer Maternal Grandmother 56   Alzheimer's disease Maternal Grandfather    Cancer Paternal Grandmother        breast   Breast cancer Paternal Grandmother 44   Bipolar disorder Daughter    Heart disease Cousin    Breast cancer Cousin        dx 30s-40s, d. 30s-40s   Medications:  aspirin EC 81 MG tablet buPROPion  (WELLBUTRIN  SR) 150 MG 12 hr tablet cholestyramine  (QUESTRAN ) 4 g packe gabapentin  (NEURONTIN ) 800 MG tablet - since 2016.  meclizine  (ANTIVERT ) 25 MG tablet - PRN meloxicam  (MOBIC ) 15 MG tablet -  methimazole  (TAPAZOLE ) 5 MG tablet  Multiple Vitamin (MULTIVITAMIN PO) nortriptyline  (PAMELOR ) 50 MG capsule omeprazole  (PRILOSEC) 20 MG capsule scopolamine  (TRANSDERM-SCOP) 1 MG/3DAYS venlafaxine  XR (EFFEXOR -XR) 150 MG 24 hr capsule   Academic/Vocational History: Graduated high school. No learning difficulties or behavioral/attention related issues suggestive of ADHD.  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 Works about 9 hours per day 4 days a week.   Psychosocial: Marital Status: She has been with her husband for 46 years and married for 44 years.  Children/Grandchildren: Two adult children and two granddaughters.  Living Situation: Alone with her husband.  Daily Activities/Hobbies: Enjoys traveling and does so multiple times per year.   Mental Status/Behavioral Observations: The patient was seen on an outpatient basis in the [facility] PM&R office for the clinical interview accompanied by her husband.  Sensorium/Arousal: Alert. Hearing and vision adequate for the purpose of the visit.  Orientation: Full Appearance: Appropriate dress and hygiene Behavior: Unremarkable Speech/Language: Receptive language appeared grossly intact based on behavioral observation.  Expressive speech was prosodic, fluent, and  well-articulated. Motor: Within normal limits. Social Comportment: Appropriate for the setting. Mood: Neutral to anxious. Affect: Congruent. Thought Process/Content: Coherent, linear, goal directed. Ability to Participate in Interview: Readily answered all questions regarding personal history with adequate detail. Insight: WNL   SUMMARY / CLINICAL IMPRESSIONS The patient was referred by neurology for neuropsychological evaluation by her primary care provider (Dr. Vicci) due to concerns for difficulty with short term memory and word-finding. The patient was referred to neurology due to headaches and vertigo and seen on 03/07/2022. She was diagnosed with vestibular migraine.  Neurology notes indicated patient report of intermittent disorientation and getting lost while driving in [address]. Records indicated patient report of worsening cognitive symptoms (9/24), MMSE 30/30. Word finding difficulty was reported in that visit as well. Patient reports history of AD in both grandparents and mother. 03/13/23 ATN profile was not suggestive of AD related pathology.  APOE genotype results (03/13/23) were negative for increased risk AD risk (E3/E3 genotype). B12 lab results from 04/12/23 were reportedly normal. In a neurology follow up visit (09/10/23) notes state that she continues to experience memory difficulties (losing train of thought, word-finding).  Clinic notes also indicated she has been having increased nausea and vertigo.  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  Subjective cognitive complaints involve primarily difficulties with processing speed and attention and concentration (including working  memory).  She describes some mild difficulties with geographic disorientation.  She reports reduced frustration tolerance.  She has a history of difficulty with anxiety and recurrent work demands relatively significant and possibly  exacerbated by personality traits (perfectionism).  She is intact with instrumental and basic activities of daily living.  She describes some minor difficulties at work.  She has a history of multiple surgeries during 2023.   DISPOSITION / PLAN  The patient has been set up for a formal neuropsychological assessment to objectively assess her cognitive functioning across domains to establish the patient's cognitive profile. This data, in conjunction with information obtained via clinical interview and medical record review, will help clarify likely etiology and guide treatment recommendations. Once data collection and interpretation have been completed, the findings / diagnosis and recommendations will be reviewed and discussed with the patient during a feedback appointment with the neuropsychologist. Based on the collaborative dialogue with the patient during the feedback, recommendations may be adjusted / tailored as needed. A formal report will be produced and provided to the patient and the referring provider.   Diagnosis: Memory Loss    This report was generated using voice recognition software. While this document has been carefully reviewed, transcription errors may be present. I apologize in advance for any inconvenience. Please contact me if further clarification is needed.             Evalene DOROTHA Riff, PsyD             Neuropsychologist

## 2024-01-16 ENCOUNTER — Other Ambulatory Visit

## 2024-01-17 LAB — TSH: TSH: 2.12 m[IU]/L (ref 0.40–4.50)

## 2024-01-17 LAB — T4, FREE: Free T4: 1 ng/dL (ref 0.8–1.8)

## 2024-01-17 LAB — CORTISOL: Cortisol, Plasma: 1.2 ug/dL — ABNORMAL LOW

## 2024-01-21 ENCOUNTER — Encounter

## 2024-01-21 DIAGNOSIS — R413 Other amnesia: Secondary | ICD-10-CM | POA: Diagnosis not present

## 2024-01-21 DIAGNOSIS — F419 Anxiety disorder, unspecified: Secondary | ICD-10-CM | POA: Diagnosis not present

## 2024-01-21 NOTE — Progress Notes (Signed)
 Behavioral Observations:  The patient was oriented to self, place, and time. The patient's hearing and vision were adequate for testing. She ambulated independently and without issue. No hand tremor was noted during testing. Her speech was prosodic, fluent, and well-articulated. She displayed no clear indications of word-finding difficulties in conversational speech and no notable paraphasic errors were noted. Receptive language appeared intact. The patient's affect was congruent with mood and her mood was largely neutral to positive, though she appeared to become slightly anxious when put on the spot. The patient was alert and participated in testing as instructed. She was cooperative throughout the session. Her pace was steady. She showed no difficulties with frustration tolerance. The patient's social interactions were unremarkable and consistent with the setting. No frank attentional lapses were appreciated.  Neuropsychology Note  TEREN FRANCKOWIAK completed 120 minutes of neuropsychological testing with technician, Josue Ned, BA, under the supervision of Evalene Riff, PsyD., Clinical Neuropsychologist. The patient did not appear overtly distressed by the testing session, per behavioral observation or via self-report to the technician. Rest breaks were offered.   Clinical Decision Making: In considering the patient's current level of functioning, level of presumed impairment, nature of symptoms, emotional and behavioral responses during clinical interview, level of literacy, and observed level of motivation/effort, a battery of tests was selected by Dr. Riff during initial consultation on 01/10/2024. This was communicated to the technician. Communication between the neuropsychologist and technician was ongoing throughout the testing session and changes were made as deemed necessary based on patient performance on testing, technician observations and additional pertinent factors such as those  listed above.  Tests Administered: Automatic Data Edition (BNT-2) Brief Visuospatial Memory Test-Revised (BVMT-R) Benton Judgment of Line Orientation (JOLO) [place]  Verbal Learning Test-Third Edition (CVLT-3) Clock Drawing Test Controlled Oral Word Association Test (FAS & Animals) Delis-Kaplan Executive Function System (D-KEFS), select subtests Rey Complex Figure Test (RCFT), select subtests Trail Making Test (TMT; Part A & B) Wechsler Adult Intelligence Scale-Fourth Edition (WAIS-IV), select subtests Wechsler Abbreviated Scale of Intelligence [employer] fast streamer) Wechsler Memory Scale-Fourth Edition (WMS-IV) , select subtests Wechsler Memory Scale-Third Edition (WMS-III), select subtests  The Beck Depression Inventory-II (BDI-II) Beck Anxiety Inventory (BAI)  Results: Note: This summary of test scores accompanies the interpretive report and should not be interpreted by unqualified individuals or in isolation without reference to the report. Test scores are relative to age, gender, and educational history as available and appropriate.   Measurement properties of test scores: IQ, Index, and Standard Scores (SS): Mean = 100; Standard Deviation = 15; Scaled Scores (ss): Mean = 10; Standard Deviation = 3; Z scores (Z): Mean = 0; Standard Deviation = 1; T scores (T); Mean = 50; Standard Deviation = 10  Intellectual/Premorbid Functioning Estimate   Norm Score Percentile Range  WASI-II FSIQ-2  SS = 90 25 %ile Average   Vocabulary   t = 51 53 %ile Average   Matrix Reasoning   t = 38 12 %ile Low Average   ATTENTION AND WORKING MEMORY   Norm Score Percentile Range  WAIS-IV          Digit Span  ss = 13 84 %ile High Average   DSF  ss = 19 99.9 %ile Exceptionally High   Span:    9      DSB  ss = 10 50 %ile Average   Span:    5      DSS  ss = 11 63 %ile Average   Span:  6      Arithmetic  ss = 7 16 %ile Low Average  WMS-III          Spatial Span  ss = 11 63 %ile Average   SSF  ss = 12  75 %ile High Average   Span:    6      SSB  ss = 9 37 %ile Average   Span:    4      PROCESSING SPEED    Norm Score Percentile  Range  WAIS-IV          Coding  ss = 8 25 %ile Average   Symbol Search  ss = 11 63 %ile Average   LANGUAGE    Norm Score Percentile  Range  [place] Naming Test (BNT-2)  t = 48 42 %ile Average  COWAT          FAS  t = 53 61 %ile Average   Animals  t = 42 21 %ile Low Average  DKEFS - Verbal Fluency          Category Switching  ss = 7 16 %ile Low Average   Switching Accuracy  ss = 6 9 %ile Low Average   EXECUTIVE FUNCTIONING    Norm Score Percentile  Range  DKEFS - Color-Word Interference          Color Naming  ss = 11 63 %ile Average   Word Reading  ss = 13 84 %ile High Average   Inhibition  ss = 8 25 %ile Average   Errors  ss = 10 50 %ile Average   Inhibition Switching  ss = 13 84 %ile High Average   Errors  ss = 11 63 %ile Average  Trails A  t = 47 37 %ile Average  Trails B  t = 49 45 %ile Average   MEMORY    Norm Score Percentile  Range  BVMT-R          Trial 1  t = 28.0 2 %ile Exceptionally Low   Trial 2  t = 35 7 %ile Below Average   Trial 3  t = 39.0 13 %ile Low Average   Total Recall  t = 32.0 4 %ile Below Average   Learning  t = 63.0 91 %ile Above Average   Delayed Recall  t = 43.0 25 %ile Average   % Retained    100 >16 %ile WNL   Hits     1-2 %ile Below to Exceptionally Low   False Alarms     >16 %ile WNL   Recognition Discriminability     3-5 %ile Below Average  CVLT-III          Trial 1  ss = 11.0 63 %ile Average   Trial 2  ss = 12.0 75 %ile High Average   Trial 3  ss = 13.0 84 %ile High Average   Trial 4  ss = 14.0 91 %ile Above Average   Trial 5  ss = 12.0 75 %ile High Average   Trial B  ss = 11.0 63 %ile Average   Short Delay Free Recall  ss = 12.0 75 %ile High Average   Short Delay Cued Recall  ss = 13.0 84 %ile High Average   Long Delay Free Recall  ss = 9.0 37 %ile Average   Long Delay Cued Recall  ss = 12.0 75 %ile High  Average   Total Hits  ss = 10.0 50 %ile Average  Total False Positives  ss = 14.0 91 %ile Above Average   Recognition Discriminability  ss = 13.0 84 %ile High Average   Total Intrusions  ss = 14.0 91 %ile Above Average   Trials 1-5 Total Correct  SS = 114 82 %ile High Average   Total Repetitions  ss = 8.0 25 %ile Average   List B vs. Trial 1  ss = 11.0 63 %ile Average   SD (FR) vs. Trial 5 Correct  ss = 10.0 50 %ile Average   LD (FR)vs. SD (FR)  ss = 5.0 5 %ile Below Average  Wechsler Memory Scale, 4th Edition (WMS-4)         Log. Mem. Immediate Recall  ss = 1 0.1 %ile Exceptionally Low   Logical Memory Delayed Recall  ss = 1 0.1 %ile Exceptionally Low   Logical Recognition    <=2nd  %ile Exceptionally Low   VISUAL-SPATIAL    Norm Score Percentile  Range  WAIS-IV          Block Design  ss = 8 25 %ile Average  Benton JOLO  ss = 10 50 %ile Average  Rey Complex Figure Copy       11 to 16 %ile Low Average  Clock          PERSONALITY AND BEHAVIORAL FUNCTIONING      Score/Interpretation  BDI Raw       4  BDI Severity       Minimal.  BAI Raw       4  BAI Severity       Minimal.     Feedback to Patient: Carol Becker will return on 02/01/2024 for an interactive feedback session with Dr. Hayden at which time her test performances, clinical impressions and treatment recommendations will be reviewed in detail. The patient understands she can contact our office should she require our assistance before this time.  120 minutes spent face-to-face with patient administering standardized tests, 30 minutes spent scoring Radiographer, therapeutic). [CPT [id number], [id number]]  Full report to follow.

## 2024-01-24 ENCOUNTER — Encounter: Admitting: Psychology

## 2024-01-24 DIAGNOSIS — F419 Anxiety disorder, unspecified: Secondary | ICD-10-CM

## 2024-01-24 DIAGNOSIS — R4189 Other symptoms and signs involving cognitive functions and awareness: Secondary | ICD-10-CM | POA: Diagnosis not present

## 2024-01-24 DIAGNOSIS — R413 Other amnesia: Secondary | ICD-10-CM | POA: Diagnosis not present

## 2024-01-29 NOTE — Progress Notes (Signed)
 NEUROPSYCHOLOGICAL EVALUATION [place]. [facility]  Physical Medicine and Rehabilitation     Patient: Carol Becker  MRN: [id number] DOB: 23-Apr-1961  Age: 63 y.o. Sex: female  Race/Ethnicity: White or Caucasian  Years of Education: 12 Handedness: Right  Referring Provider: Vicci Duwaine SQUIBB, DO  Collateral information source: Husband Carol Becker)  Provider/Clinical Neuropsychologist: Evalene DOROTHA Riff, PsyD  Date of Service: 01/24/24  Start Time: 9 AM End Time: 10 AM  Location of Service:  [facility] Physical Medicine & Rehabilitation Department [place]. [facility] [address], [place]. [address] [place], [place] [address] Phone: [phone]  Billing Code/Service: [phone]  Individuals Present: Evalene Riff, PsyD 1 hour was spent on interpretation of patient data, interpretation of standardized test results and clinical data, clinical decision making, initial treatment planning/recommendations, and report writing. The report will be amended as needed based on any additional information collected during interactive feedback session.   REASON FOR REFERRAL:The patient was referred by neurology for neuropsychological evaluation by her primary care provider (Dr. Vicci) due to concerns for difficulty with short term memory and word-finding. The patient was referred to neurology due to headaches and vertigo and seen on 03/07/2022. She was diagnosed with vestibular migraine.  Neurology notes indicated patient report of intermittent disorientation and getting lost while driving in [id number]. Records indicated patient report of worsening cognitive symptoms (9/24), MMSE 30/30. Word finding difficulty was reported in that visit as well. Patient reports history of AD in both grandparents and mother. 03/13/23 ATN profile was not suggestive of AD related pathology.  APOE genotype results (03/13/23) were negative for increased risk AD risk (E3/E3 genotype). B12 lab results from 04/12/23 were  reportedly normal. In a neurology follow up visit (09/10/23) notes state that she continues to experience memory difficulties (losing train of thought, word-finding).  Clinic notes also indicated she has been having increased nausea and vertigo.  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  Upon interview, the patient indicated an interest in undergoing the evaluation to understand the nature of her memory difficulties and what to expect in the future.  HISTORY OF PRESENTING CONCERNS: The following was obtained from the patient and collateral (spouse) via clinical interview.  Cognitive Symptom Onset & Course: Gradual onset of cognitive difficulties around 2023 and progressive course, particularly within the last year. Notable events include 9 surgeries within 11 months, most occurring within 2023. She described significant work related stress that has been increased since early 2025.    Current Cognitive Complaints:  Memory: Increased difficulty misplacing things.  Generally remembers medications without issues but on rare occasion will forget whether she has taken a medication already that day.  No significant difficulties remembering conversations or recent events.  No significant unintentional repeating statements or questions. Reports feeling lost about every time I go somewhere until seeing something familiar.  Processing Speed: Endorsement of slowed processing speed, which can reportedly be a source of anxiety.  Attention & Concentration: Described frequently losing train of thought. Difficulties with internal distraction thinking ahead rather than distraction due to extraneous/external stimuli.  Language: Only occasional word finding difficulties. Some increased occurrence of mis-naming/paraphasic errors, but happens only once a week on average. No significant difficulties with receptive language. No difficulties with  basic writing composition. Relies on calculator increasingly.   Visual-Spatial: No clear indications of impairments in visual-spatial abilities excluding occasional geographic disorientation.   Executive Functioning: Reduced frustration tolerance (she will give up on  a task... lose concentration... more easily frustrated) as of at least one year ago. She described herself as being more emotional relative to her historical baseline. She described significant frustration with her cognitive difficulties. This has been present more so over the last year. No marked impulsivity, impairments in judgment, or clear indicatorssuggestive of personality changes / social disinhibition.   Motor/Sensory Complaints:   Sensory changes: No significant difficulties with sense of smell, taste, hearing, vision. Balance/coordination difficulties: Some balance difficulties approximately once per week. Frequent instances of dizziness/vertigo: Some dizziness and vertigo, and nausea, which has improved since 2023.  Other motor difficulties: None.   Emotional and Behavioral Functioning:  Depression: Denied significant depressive symptoms at present. Acknowledged maybe slight mood related difficulties.  Anxiety: Current and long-running difficulties with anxiety. Symptoms include worry, difficulty relaxing, feeling tense. Difficulties are slightly increased relative to 10 years ago.  Other: The patient describes significant stress related to work and feeling overwhelmed.  She recognizes some contributions to her perfectionism and her stress. Has no history of engaging with individual therapy.   Sleep: Sleeps 6-8 hours per night. Feels rested in morning. No marked difficulties with onset or maintenance. Some talking in her sleep, but very rarely (once every couple years). Has been diagnosed with sleep apnea (10-12 years ago). Was on cpap for about five years.  Appetite: Stopped taking Ozempic  two weeks prior to date of the  interview. Reported that she was on this for about three years and stopped taking it because of fatigue.  Caffeine: None.  Alcohol Use: No current alcohol consumption. Did have a history of heavy alcohol use.  Tobacco Use: Quit smoking around 12 years ago. Smoked 2 packs per day. Smoked for 30 years.  Recreational Substance Use: None.    Level of Functional Independence: The patient is intact with basic and instrumental activities of daily living.   Medical History/Record Review:  History of traumatic brain injury/concussion: Brief LOC from head injury around 34 or 63 years of age.   History of stroke: No   History of heart attack: No   History of cancer/chemotherapy:No chemo or radiation of the head   History of seizure activity: None   Symptoms of chronic pain: 2/10 in severity.   Experience of frequent headaches/migraines: None     Past Medical History:  Diagnosis Date   Adrenal nodule (HCC)    Anxiety    Arthritis    knees, left thumb, shoulders   Breast cancer (HCC)    Left   Cervical spinal stenosis    Family history of breast cancer    GERD (gastroesophageal reflux disease)    History of hypothyroidism    Hyperlipidemia    poss history of   Hyperthyroidism    Hypothyroidism    Osteopenia    PONV (postoperative nausea and vomiting)    after wisdom teeth   Pre-diabetes    Prediabetes    Restless leg syndrome    Sleep apnea    tested ~2012- lost a lot of weight, no longer has sleep apnea.- per patient   Tremors of nervous system    medication related   Vertigo    Vestibular migraine    Patient Active Problem List   Diagnosis Date Noted   Statin myopathy 11/27/2023   Graves disease 12/08/2022   Aortic atherosclerosis (HCC) 11/28/2022   CAD (coronary artery disease) 11/28/2022   COPD (chronic obstructive pulmonary disease) (HCC) 11/28/2022   Adrenal adenoma, left 08/09/2022   RLS (restless legs syndrome)  11/21/2021   Family history of breast cancer  04/12/2021   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/2021   Adhesive capsulitis of left shoulder 03/04/2021   Vestibular migraine 11/12/2020   Posterior vitreous detachment of both eyes 11/12/2020   Polyp of ascending colon    Pain in finger of left hand 08/30/2020   Vertiginous migraine 08/14/2020   Status post cervical spinal fusion 08/03/2020   Phonophobia 04/05/2020   Benign paroxysmal positional vertigo 04/05/2020   GAD (generalized anxiety disorder) 03/23/2020   Drug-induced parkinsonism (HCC) 03/23/2020   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   Prolapsed cervical intervertebral disc 01/21/2019   Rheumatoid factor positive 12/23/2018   Primary osteoarthritis of first carpometacarpal joint of left hand 09/01/2018   Pain in joints of left hand 09/01/2018   Chronic pain 01/07/2018   DJD (degenerative joint disease), cervical 12/19/2016   Controlled substance agreement signed 06/13/2016   Hyperlipidemia 03/23/2015   Hyperthyroidism 03/23/2015   Ulnar neuropathy 12/24/2014   Carpal tunnel syndrome 09/15/2014   Imaging/Lab Results:  03/13/23  "ATN SUMMARY Comment Comment:                        A- T- N- A normal beta-amyloid 42/40 ratio and normal concentrations of pTau181 and NfL were observed at this time. These results are not consistent with the presence of Alzheimer's- related pathology. Plasma findings may be less precise than CSF or PET. Additional assessments may be necessary. These tests are intended to be used only in the context of clinical care."   EXAM: MRI brain without contrast  ... STUDY DATE: 09/26/2023  ... COMPARISON FILMS: CT head without 12/27/2004  ... FINDINGS:  The brain parenchyma shows mild age-related changes of chronic small vessel disease.  There is mild degree of generalized cerebral atrophy which is likely age disproportionate.  No structural lesion, tumor or infarct is noted.   Diffusion-weighted imaging is negative for acute ischemia.  SWI images do not show microhemorrhages.  Subarachnoid spaces and ventricular system show slight dilatation.  Extra-axial brain structures appear normal.  Calvarium shows no abnormalities.  Orbits appear unremarkable.  Paranasal sinuses show mild chronic mucosal thickening.  The pituitary gland and cerebellar tonsils appear normal.  Visualized portion of the cervical spine shows only minor disc degenerative changes.  Flow-voids of large vessels are intact and circulation appear to be patent   IMPRESSION: MRI scan of the brain without contrast showing mild age advanced changes of generalized cerebral atrophy.  No acute abnormalities.  09/03/2020 EXAM: MRI HEAD WITHOUT AND WITH CONTRAST COMPARISON: CT head December 27, 2004.  FINDINGS: Brain: No acute infarction, hemorrhage, hydrocephalus, extra-axial collection or mass lesion. Mild scattered T2/FLAIR hyperintensities within the white matter, most likely related to chronic microvascular ischemic disease and within normal limits for patient age. No abnormal enhancement. IMPRESSION: No evidence of acute intracranial abnormality.  No acute infarct.  Family Neurologic/Medical Hx:  Family History  Problem Relation Age of Onset   Heart disease Father        CAD/VT   Heart disease Sister 44       CAD, multiple stents   Parkinson's disease Sister    Cancer Maternal Grandmother        breast   Breast cancer Maternal Grandmother 18   Alzheimer's disease Maternal Grandfather    Cancer Paternal Grandmother        breast   Breast cancer Paternal Grandmother  60   Bipolar disorder Daughter    Heart disease Cousin    Breast cancer Cousin        dx 30s-40s, d. 30s-40s   Medications:  aspirin EC 81 MG tablet buPROPion  (WELLBUTRIN  SR) 150 MG 12 hr tablet cholestyramine  (QUESTRAN ) 4 g packe gabapentin  (NEURONTIN ) 800 MG tablet - since 2016.  meclizine  (ANTIVERT ) 25 MG tablet -  PRN meloxicam  (MOBIC ) 15 MG tablet -  methimazole  (TAPAZOLE ) 5 MG tablet  Multiple Vitamin (MULTIVITAMIN PO) nortriptyline  (PAMELOR ) 50 MG capsule omeprazole  (PRILOSEC) 20 MG capsule scopolamine  (TRANSDERM-SCOP) 1 MG/3DAYS venlafaxine  XR (EFFEXOR -XR) 150 MG 24 hr capsule   Academic/Vocational History: Graduated high school. No learning difficulties or behavioral/attention related issues suggestive of ADHD.  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 Works about 9 hours per day 4 days a week.   Psychosocial: Marital Status: She has been with her husband for 46 years and married for 44 years.  Children/Grandchildren: Two adult children and two granddaughters.  Living Situation: Alone with her husband.  Daily Activities/Hobbies: Enjoys traveling and does so multiple times per year.   NEUROPSYCHODIAGNOSTIC FINDINGS: Behavioral Observations: The patient was oriented to self, place, and time. The patient's hearing and vision were adequate for testing. She ambulated independently and without issue. No hand tremor was noted during testing. Her speech was prosodic, fluent, and well-articulated. She displayed no clear indications of word-finding difficulties in conversational speech and no notable paraphasic errors were noted. Receptive language appeared intact. The patient's affect was congruent with mood and her mood was largely neutral to positive, though she appeared to become anxious when put on the spot. The patient was alert and participated in testing as instructed. She was cooperative throughout the session. Her pace was steady. She showed no difficulties with frustration tolerance. The patient's social interactions were unremarkable and consistent with the setting. No frank attentional lapses were appreciated.  Tests Administered: Automatic Data Edition (BNT-2) Brief Visuospatial Memory Test-Revised (BVMT-R) Benton Judgment of Line Orientation  (JOLO) [place]  Verbal Learning Test-Third Edition (CVLT-3) Clock Drawing Test Controlled Oral Word Association Test (FAS & Animals) Delis-Kaplan Executive Function System (D-KEFS), select subtests Rey Complex Figure Test (RCFT), select subtests Trail Making Test (TMT; Part A & B) Wechsler Adult Intelligence Scale-Fourth Edition (WAIS-IV), select subtests Wechsler Abbreviated Scale of Intelligence Civil Service fast streamer) Wechsler Memory Scale-Fourth Edition (WMS-IV) , select subtests Wechsler Memory Scale-Third Edition (WMS-III), select subtests  The Beck Depression Inventory-II (BDI-II) Beck Anxiety Inventory (BAI)  Results: Test scores are relative to age, gender, and educational history as available and appropriate. Measurement properties of test scores: IQ, Index, and Standard Scores (SS): Mean = 100; Standard Deviation = 15; Scaled Scores (ss): Mean = 10; Standard Deviation = 3; Z scores (Z): Mean = 0; Standard Deviation = 1; T scores (T); Mean = 50; Standard Deviation = 10  Performance Validity: The patient completed three measures with embedded performance validity metrics. Her scores were within normal limits on two. On the third, her scores were consistent with her overall performance on that measure and therefore not strongly suggestive of difficulties in task engagement. However, there were notable and unusual patterns in the patient's cognitive test profile which were clinically inconsistent and suggestive of likely interference from secondary factors. This is discussed in further detail in the summary/impressions section below, including implications for score interpretation.    Intellectual/Premorbid Functioning Estimate   Norm Score Percentile Range  WASI-II FSIQ-2  SS = 90 25 %ile Average  Vocabulary   t = 51 53 %ile Average   Matrix Reasoning   t = 38 12 %ile Low Average  Overall intellectual abilities are estimated to be within the average range based on an abbreviated IQ measure and  patient history.   ATTENTION AND WORKING MEMORY   Norm Score Percentile Range  WAIS-IV          Digit Span  ss = 13 84 %ile High Average   DSF  ss = 19 99.9 %ile Exceptionally High   Span:    9      DSB  ss = 10 50 %ile Average   Span:    5      DSS  ss = 11 63 %ile Average   Span:    6      Arithmetic  ss = 7 16 %ile Low Average  WMS-III          Spatial Span  ss = 11 63 %ile Average   SSF  ss = 12 75 %ile High Average   Span:    6      SSB  ss = 9 37 %ile Average   Span:    4     Auditory-verbal attention performance was scored in the high average range overall. Basic span of auditory-verbal attention was scored in the exceptionally high score range. Her performance was lower on subtests involving greater working-memory/mental manipulation demands, but scores were average by age.   The patient's performance on a mental arithmetic measure was scored in the low average range. Examination of her per-item responses and response times showed very fast response times, utilizing only a small portion of the allotted time, and this largely continued even on more difficult items and slightly concerning for rushed responding..  The patient's performance on a visual attention test was scored in the average range overall, and subtest performances were relatively consistent, ranging from high average to average. (Subtests on this measure do not assess working-memory, as the auditory-verbal test does).  PROCESSING SPEED    Norm Score Percentile  Range  WAIS-IV          Coding  ss = 8 25 %ile Average   Symbol Search  ss = 11 63 %ile Average  Processing speed performances were consistently within the average range. Her scores were slightly lower on the processing speed measure which has slightly greater working-memory demands, relative to the task with greater emphasis on rapid visual processing of novel stimuli.   LANGUAGE    Norm Score Percentile  Range  Boston Naming Test (BNT-2)  t = 48 42 %ile  Average  COWAT          FAS  t = 53 61 %ile Average   Animals  t = 42 21 %ile Low Average  As previously noted, the patient's score on a measure assessing word knowledge was scored in the average range.  The patient's performance on a confrontation naming/word retrieval task was average by age and education.  Within verbal fluency, she scored within the average range for phonemic verbal fluency (letter) and she scored within the low average range for semantic verbal fluency (category) by age and education.  EXECUTIVE FUNCTIONING    Norm Score Percentile  Range  DKEFS - Color-Word Interference          Color Naming  ss = 11 63 %ile Average   Word Reading  ss = 13 84 %ile High Average   Inhibition  ss =  8 25 %ile Average   Errors  ss = 10 50 %ile Average   Inhibition Switching  ss = 13 84 %ile High Average   Errors  ss = 11 63 %ile Average  Trails A  t = 47 37 %ile Average  Trails B  t = 49 45 %ile Average   DKEFS - Verbal Fluency          Category Switching  ss = 7 16 %ile Low Average   Switching Accuracy  ss = 6 9 %ile Low Average  The patient pleaded several measures of executive functioning.  Her scores on baseline trials showed average speed for rapid color naming and high average speed for rapid color-word reading.  Her speed and accuracy on a basic response inhibition trial were both average, although her speed was slightly low relative to her baseline scores.  Surprisingly, her speed improved considerably when set shifting was combined with response inhibition and she scored in the high average range.  Her accuracy did not suffer and she maintained an average range performance and total errors.  The patient had no difficulty on a basic sequencing task and scored in the average range for speed and made no errors.  When a set shifting component was added she continued to perform in the average range and also made no errors.  The patient's performance on a semantic verbal fluency task with a  set shifting element was low average for accuracy and total words, although this was consistent with her semantic verbal fluency performance without set shifting.  MEMORY    Norm Score Percentile  Range  BVMT-R          Trial 1  t = 28.0 2 %ile Exceptionally Low   Trial 2  t = 35 7 %ile Below Average   Trial 3  t = 39.0 13 %ile Low Average   Total Recall  t = 32.0 4 %ile Below Average   Learning  t = 63.0 91 %ile Above Average   Delayed Recall  t = 43.0 25 %ile Average   % Retained    100 >16 %ile WNL   Hits     1-2 %ile Below to Exceptionally Low   False Alarms     >16 %ile WNL   Recognition Discriminability     3-5 %ile Below Average  CVLT-III          Trial 1  ss = 11.0 63 %ile Average   Trial 2  ss = 12.0 75 %ile High Average   Trial 3  ss = 13.0 84 %ile High Average   Trial 4  ss = 14.0 91 %ile Above Average   Trial 5  ss = 12.0 75 %ile High Average   Trial B  ss = 11.0 63 %ile Average   Short Delay Free Recall  ss = 12.0 75 %ile High Average   Short Delay Cued Recall  ss = 13.0 84 %ile High Average   Long Delay Free Recall  ss = 9.0 37 %ile Average   Long Delay Cued Recall  ss = 12.0 75 %ile High Average   Total Hits  ss = 10.0 50 %ile Average   Total False Positives  ss = 14.0 91 %ile Above Average   Recognition Discriminability  ss = 13.0 84 %ile High Average   Total Intrusions  ss = 14.0 91 %ile Above Average   Trials 1-5 Total Correct  SS = 114 82 %ile High Average  Total Repetitions  ss = 8.0 25 %ile Average   List B vs. Trial 1  ss = 11.0 63 %ile Average   SD (FR) vs. Trial 5 Correct  ss = 10.0 50 %ile Average   LD (FR)vs. SD (FR)  ss = 5.0 5 %ile Below Average  Wechsler Memory Scale, 4th Edition (WMS-4)         Log. Mem. Immediate Recall  ss = 1 0.1 %ile Exceptionally Low   Logical Memory Delayed Recall  ss = 1 0.1 %ile Exceptionally Low   Logical Recognition    <=2nd  %ile Exceptionally Low  The patient's performance on memory testing showed significant  variability.  The patient completed one visual memory test into auditory-verbal memory tests.  On the visual memory test the patient showed weak initial encoding but considerable gains from repetition across the three learning trials that generated and above average range score in learning.  However, given the limited encoding upon initial exposure the total information encoded across the three learning trials was scored in the below average range.  However, following a distraction filled delay, the patient's free recall was average and she retained 100% of all information she initially learned.  Her endorsements on the delayed recognition task were low for true positives, intact for false positives, and below average for recognition discriminability.  However, based on examination of her per item performances and behavioral observations by the technician, her endorsements on the recognition task were actually in line with her initial encoding (some details were encoded incorrectly, which led to her not endorsing some true positive item on the recognition task).  On the prose memory task, involving single presentations of two short stories, her immediate recall was scored in the exceptionally low score range, her delayed recall and delayed recognition of story details was similarly within the exceptionally low score range.  By contrast, her performance on a word list memory test was quite strong.  The total number of words she learned across the five learning trials was scored in the high average range.  She had no difficulty with and interference list.  Her free and cued recall following short delay were both high average.  After a long delay, she scored average with free recall and high average for cued.  While the relative difference between her long delay and short delay free recall was technically below average, her intact performance in the cued recall showed more of a retrieval difficulty than indications  of information loss.  Qualitatively, she recalled 11 words freely after the short delay and nine words after a long delay.  She recalled 13 words after both short and long delays when cued.  On the delayed recognition task she was average for true positives, above average for false positives, and high average for overall discriminability.  She scored above average in terms of total intrusion errors and she was average in terms of repetition errors.  VISUAL-SPATIAL    Norm Score Percentile  Range  WAIS-IV          Block Design  ss = 8 25 %ile Average  Benton JOLO  ss = 10 50 %ile Average  Rey Complex Figure Copy       11 to 16 %ile Low Average  Clock       WNL  On visual spatial measures, the patient's performance on a visual construction task requiring her to replicate a two-dimensional figure using three-dimensional blocks was scored in the average range by age.  Her judgment of line orientation was average.  Her copy of a complex geometric figure was scored in the low average range.  She showed no gross visual-spatial impairments on the figure copy task but rather had a number of small errors that accumulated.  Her clock drawing was within normal limits.  PERSONALITY AND BEHAVIORAL FUNCTIONING      Score/Interpretation  BDI Raw       4  BDI Severity       Minimal.  BAI Raw       4  BAI Severity       Minimal.  Self-report endorsements on measures of mood and anxiety generated scores in the subclinical range.  SUMMARY / CLINICAL IMPRESSIONS The patient was referred by neurology for neuropsychological evaluation by her primary care provider (Dr. Vicci) due to concerns for difficulty with short term memory and word-finding. The patient was referred to neurology due to headaches and vertigo and seen on 03/07/2022. She was diagnosed with vestibular migraine.  Neurology notes indicated patient report of intermittent disorientation and getting lost while driving in [id number]. Records indicated patient  report of worsening cognitive symptoms (9/24), MMSE 30/30. Word finding difficulty was reported in that visit as well. Patient reports history of AD in both grandparents and mother. 03/13/23 ATN profile was not suggestive of AD related pathology.  APOE genotype results (03/13/23) were negative for increased risk AD risk (E3/E3 genotype). B12 lab results from 04/12/23 were reportedly normal. In a neurology follow up visit (09/10/23) notes state that she continues to experience memory difficulties (losing train of thought, word-finding).  Clinic notes also indicated she has been having increased nausea and vertigo.  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 Subjective cognitive complaints involve primarily difficulties with processing speed and attention and concentration (including working memory).  She describes some mild difficulties with geographic disorientation.  She reports reduced frustration tolerance.  She has a history of difficulty with anxiety and recurrent work demands relatively significant and possibly exacerbated by personality traits (perfectionism).  She is intact with instrumental and basic activities of daily living.  She describes some minor difficulties at work.  She has a history of multiple surgeries during 2023. Functionally, she is essentially intact with basic and instrumental activities of daily living.   The patient's cognitive test results show performances largely in line with premorbid estimates but some notable variability between measures, particularly within the domain of memory. The variability within memory testing was clinically inconsistent form a neurological perspective. For example, her scores on the verbal memory test involving two short stories, if interpreted at face value, would imply a severe and broad impairment in the patient's ability to learn, retain, and retrieve new verbal information.  However, her performance on the word-list memory test showed that intact (and even quite strong/better than the norm by age) abilities in those same areas. Neurologically, these findings are incongruent and suggestive of interference from a secondary factor (I.e., anxiety). Similarly, the patient's performance showed difficulty with encoding but essentially intact retrieval and maintenance on the visual memory test. The weak initial encoding fits with anxiety related interference, which impacts attention and concentration. Additionally, the prose memory test tends to pull for anxiety given it's format (long stories presented verbally and only once/without repetition). The patient's scores in other cognitive areas were largely in line with expectations. Her basic attention was high average, and she showed no frank impairments on any attention tasks. She  showed slight weakness in working memory, although response patterns in arithmetic were concerning for possible interference from rushed responding. Processing speed is intact, and verbal abilities were intact with only a slightly low performance in semantic verbal fluency (intact naming, and absence of semantic information loss on other measures [vocabulary, clock drawing] are reassuring, however). The patient's performances on executive functioning measures showed no indications of impairment, and some indications of improved performance when task difficulty was increased (Inhibition vs. Inhibition+set-shifting). Scores on visual-spatial tasks were largely in-line with expectations. Her performance on the figure copy appeared to be related to attention to detail rather than visual-spatial impairments. The performance on one abstract nonverbal reasoning task was a bit low, but absence of broader executive deficits or visual-spatial impairments, is not itself concerning. Overall, scores show that her cognitive capabilities appear in line with baseline estimates, but  that reliable execution of those abilities varies and is likely due to interference from other factors.   At this point, the patient's cognitive profile is not showing compelling evidence of neurologically based cognitive decline. This is consistent with imaging and other medical test results. The data suggests likely interference from secondary factors such as anxiety/stress. While endorsements on self-report measures of anxiety are low, this is inconsistent with other data sources and is suspected to be an underestimate/under-report of anxiety related difficulties. Behavioral observations noted some anxiety during testing, data from the initial clinical interview, and follow-up interviewing during the feedback session suggest significant anxiety and stress related struggles. The difficulties appear to be related to a combination of long-standing generalized anxiety, combined with perfectionistic tendencies, exacerbated by marked increases in workplace stress (and self-doubt). The levels of anxiety/stress difficulty observed are more than capable of impacting cognitive functioning. Addressing these are similarly likely to lead to improvements in cognitive struggles. At this point, no formal cognitive diagnosis can be made. Difficulties on testing are inconsistent and suggestive of interference from mental health rather than neurological change, and the patient's functioning (despite challenges) does not align with a neurocognitive disorder diagnosis.   In discussion during the feedback, out of an abundance of caution, a re-evaluation can be conducted (approximately 12 months) if desired. This can better assess for change over time through comparing objective test scores at that time with those from the current evaluation. Given suspicion of anxiety/stress related interference, I recommend the patient engage with providers for treatment in the interim.    Diagnosis: No formal cognitive diagnosis at this  time Unspecified anxiety disorder  Recommendations:  Follow up with the referring provider as planned.  As discussed, the provider has submitted a referral for individual therapy with behavioral health providers at [facility]. There can sometimes be a significant wait-list for these services. If the patient decides she would like to meet with someone sooner, [email or web address] has a Proofreader of mental health providers. Results can be filtered by location, insurances accepted, specialty, and many other variables. Providers also post a brief description of who they are and the services they provide, which can be helpful when looking for someone who is a good fit for the patient. No referral is needed with these providers.  Continued engagement with medical health providers for ongoing management of chronic medical conditions is strongly recommended. Management of cardiovascular health is of particular importance given close relationship between cardiovascular health (heart health) and cerebrovascular health (brain health).  The patient is not currently utilizing a cPAP. If there are any concerns for OSA it would be beneficial to have  a re-evaluation to confirm this is not present or in need of treatment. Untreated sleep apnea impacts sleep quality and can significantly impact cognitive functioning. It also has a connection to cardiovascular health which can impact cerebrovascular health.  Engaging in cognitively stimulating activities is beneficial for cognitive health and reducing risk of increased cognitive decline. Cognitively stimulating activities are essentially anything that requires your active participation and some level of concentration. It can take many forms. Social interaction is particularly stimulating, for example. Reading, puzzles, and even day to day problem solving are also some examples. Find what works for you! Novelty/variety is helpful and ideally the tasks are things  you enjoy. The MIND diet, a modified mediterranean diet, has been found to be beneficial for reducing risk of cognitive decline and may be beneficial for promoting brain health. Regular exercise (if approved and safe per the patient's treating physicians) can be beneficial for both overall health but also for managing anxiety and promoting cognitive health.  The current evaluation provides a cognitive baseline from which future comparisons can be made via re-evaluation. A re-evaluation is tentatively planned for 78-months from the current evaluation. If difficulties improve and concerns for cognitive decline are not longer present, a re-evaluation is not required. I defer to the patient and her treating providers in this respect. If there are sudden and unexpected cognitive declines prior to then, an evaluation can be conducted sooner if felt beneficial by the referring.  It was a pleasure meeting with the patient and her husband. Please reach out if any new questions emerge upon review of the report.               Evalene DOROTHA Riff, PsyD             Neuropsychologist   This report was generated using voice recognition software. While this document has been carefully reviewed, transcription errors may be present. I apologize in advance for any inconvenience. Please contact me if further clarification is needed.

## 2024-02-01 ENCOUNTER — Encounter: Admitting: Psychology

## 2024-02-01 ENCOUNTER — Encounter: Payer: Self-pay | Admitting: Psychology

## 2024-02-01 DIAGNOSIS — F419 Anxiety disorder, unspecified: Secondary | ICD-10-CM | POA: Diagnosis not present

## 2024-02-01 DIAGNOSIS — R4189 Other symptoms and signs involving cognitive functions and awareness: Secondary | ICD-10-CM

## 2024-02-05 ENCOUNTER — Encounter: Payer: Self-pay | Admitting: Family Medicine

## 2024-02-14 ENCOUNTER — Telehealth: Payer: Self-pay | Admitting: *Deleted

## 2024-02-14 NOTE — Telephone Encounter (Signed)
 LMOVM to verify card hx.

## 2024-02-18 ENCOUNTER — Ambulatory Visit: Attending: Cardiology | Admitting: Cardiology

## 2024-02-18 ENCOUNTER — Encounter: Payer: Self-pay | Admitting: Cardiology

## 2024-02-18 VITALS — BP 122/72 | HR 79 | Ht 64.0 in | Wt 184.2 lb

## 2024-02-18 DIAGNOSIS — E782 Mixed hyperlipidemia: Secondary | ICD-10-CM

## 2024-02-18 DIAGNOSIS — R0609 Other forms of dyspnea: Secondary | ICD-10-CM

## 2024-02-18 DIAGNOSIS — R071 Chest pain on breathing: Secondary | ICD-10-CM | POA: Diagnosis not present

## 2024-02-18 DIAGNOSIS — I2089 Other forms of angina pectoris: Secondary | ICD-10-CM

## 2024-02-18 MED ORDER — SEMAGLUTIDE (1 MG/DOSE) 4 MG/3ML ~~LOC~~ SOPN
1.0000 mg | PEN_INJECTOR | SUBCUTANEOUS | 0 refills | Status: DC
Start: 2024-04-19 — End: 2024-04-09

## 2024-02-18 MED ORDER — OZEMPIC (0.25 OR 0.5 MG/DOSE) 2 MG/3ML ~~LOC~~ SOPN: PEN_INJECTOR | SUBCUTANEOUS | 1 refills | Status: AC

## 2024-02-18 MED ORDER — METOPROLOL TARTRATE 100 MG PO TABS: 100.0000 mg | ORAL_TABLET | Freq: Once | ORAL | 0 refills | Status: DC

## 2024-02-18 NOTE — Progress Notes (Signed)
 Cardiology Office Note:    Date:  02/18/2024   ID:  Carol Becker, DOB 1960-12-30, MRN [id number]  PCP:  Vicci Duwaine SQUIBB, DO   [facility] Providers Cardiologist:  None     Referring MD: Vicci Duwaine SQUIBB, DO   Chief Complaint  Patient presents with   Follow-up   Establish Care    New pt has been doing well with no complaints of chest pain, chest pressure or SOB, medciation reviewed verbally with patient   Carol Becker is a 63 y.o. female who is being seen today for the evaluation of fatigue at the request of Vicci Duwaine SQUIBB, DO.   History of Present Illness:    Carol Becker is a 63 y.o. female with a hx of hyperlipidemia, anxiety, former smoker x 20 years, who presents with exertional fatigue.  States having symptoms of fatigue and flushing with exertion about 4 months ago while walking with her husband.  Has had 1 other such episode since then.  Sister had several stents in her 64s, patient's father had CABG in his 59s.  Patient endorses he history of hyperlipidemia, did not tolerate statins or Zetia .  Denies overt chest pain.  Wanted to be checked due to strong family history.  Past Medical History:  Diagnosis Date   Adrenal nodule (HCC)    Anxiety    Arthritis    knees, left thumb, shoulders   Breast cancer (HCC)    Left   Cervical spinal stenosis    Family history of breast cancer    GERD (gastroesophageal reflux disease)    History of hypothyroidism    Hyperlipidemia    poss history of   Hyperthyroidism    Hypothyroidism    Osteopenia    PONV (postoperative nausea and vomiting)    after wisdom teeth   Pre-diabetes    Prediabetes    Restless leg syndrome    Sleep apnea    tested ~2012- lost a lot of weight, no longer has sleep apnea.- per patient   Tremors of nervous system    medication related   Vertigo    Vestibular migraine     Past Surgical History:  Procedure Laterality Date   ANTERIOR / POSTERIOR COMBINED FUSION  CERVICAL SPINE     BREAST BIOPSY Left 03/15/2021   Stereo bx, Ribbon Clip, path pending   BREAST LUMPECTOMY Left    CHOLECYSTECTOMY N/A 09/01/2022   Procedure: LAPAROSCOPIC CHOLECYSTECTOMY;  Surgeon: Dasie Leonor CROME, MD;  Location: [facility] OR;  Service: General;  Laterality: N/A;   COLONOSCOPY WITH PROPOFOL  N/A 11/05/2020   Procedure: COLONOSCOPY WITH PROPOFOL ;  Surgeon: Jinny Carmine, MD;  Location: [facility];  Service: Endoscopy;  Laterality: N/A;   CYSTECTOMY Left    KNEE ARTHROSCOPY WITH EXCISION BAKER'S CYST Left    LAPAROSCOPY     Exploratory   POLYPECTOMY  11/05/2020   Procedure: POLYPECTOMY;  Surgeon: Jinny Carmine, MD;  Location: [facility];  Service: Endoscopy;;   SHOULDER ARTHROSCOPY WITH ROTATOR CUFF REPAIR Left 12/01/2021   Procedure: Left shoulder arthroscopy, debridement, subacromial decompression, distal clavicle resection, chondroplasty;  Surgeon: Melita Drivers, MD;  Location: [facility] ORS;  Service: Orthopedics;  Laterality: Left;    TOTAL MASTECTOMY Bilateral    Per patient Tummy tissue reconstructive surgery same time   TOTAL SHOULDER ARTHROPLASTY Left 03/16/2022   Procedure: TOTAL SHOULDER ARTHROPLASTY;  Surgeon: Melita Drivers, MD;  Location: [facility];  Service: Orthopedics;  Laterality: Left;   trapeziectomy Left  done at [facility] 06/16/20   TUBAL LIGATION     WISDOM TOOTH EXTRACTION      Current Medications: Current Meds  Medication Sig   aspirin EC 81 MG tablet Take 81 mg by mouth daily. Swallow whole.   buPROPion  (WELLBUTRIN  SR) 150 MG 12 hr tablet Take 1 tablet (150 mg total) by mouth 2 (two) times daily.   cholestyramine  (QUESTRAN ) 4 g packet Take 1 packet (4 g total) by mouth 3 (three) times daily with meals.   gabapentin  (NEURONTIN ) 800 MG tablet Take 1 pill in the AM and 2 pills in the PM   meclizine  (ANTIVERT ) 25 MG tablet Take 1 tablet (25 mg total) by mouth 2 (two) times daily as needed for dizziness.   meloxicam  (MOBIC ) 15 MG tablet Take 1  tablet (15 mg total) by mouth daily.   methimazole  (TAPAZOLE ) 5 MG tablet Take 1.5 tablets (7.5 mg total) by mouth as directed. 1.5 tablets Monday through Friday and 1 tablet on Saturday and Sunday   metoprolol  tartrate (LOPRESSOR ) 100 MG tablet Take 1 tablet (100 mg total) by mouth once for 1 dose. Take 90 - 120 min prior to procedure   Multiple Vitamin (MULTIVITAMIN PO) Take by mouth.   nortriptyline  (PAMELOR ) 50 MG capsule Take 1 capsule (50 mg total) by mouth at bedtime.   omeprazole  (PRILOSEC) 20 MG capsule Take 20 mg by mouth daily.   [START ON 04/19/2024] Semaglutide , 1 MG/DOSE, 4 MG/3ML SOPN Inject 1 mg as directed once a week.   Semaglutide , 2 MG/DOSE, (OZEMPIC , 2 MG/DOSE,) 8 MG/3ML SOPN Inject 2 mg into the skin once a week.   Semaglutide ,0.25 or 0.5MG /DOS, (OZEMPIC , 0.25 OR 0.5 MG/DOSE,) 2 MG/3ML SOPN Inject 0.25 mg into the skin once a week for 28 days, THEN 0.5 mg once a week for 28 days.   venlafaxine  XR (EFFEXOR -XR) 150 MG 24 hr capsule Take 1 capsule (150 mg total) by mouth at bedtime.     Allergies:   Crestor [rosuvastatin calcium], Ezetimibe , Sulfa antibiotics, and Zithromax [azithromycin]   Social History   Socioeconomic History   Marital status: Married    Spouse name: Not on file   Number of children: 2   Years of education: Not on file   Highest education level: 12th grade  Occupational History   Not on file  Tobacco Use   Smoking status: Former    Current packs/day: 0.00    Types: Cigarettes    Start date: 04/05/1981    Quit date: 04/06/2011    Years since quitting: 12.8   Smokeless tobacco: Never  Vaping Use   Vaping status: Former   Quit date: 04/05/2013  Substance and Sexual Activity   Alcohol use: Not Currently    Comment: On Occasion   Drug use: No   Sexual activity: Yes    Birth control/protection: Post-menopausal  Other Topics Concern   Not on file  Social History Narrative   Lives in [place] with husband; quit smoking- 30 years; no alcohol  abuse. 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]   Social Drivers of Corporate investment banker Strain: Patient Declined (11/28/2023)   Overall Financial Resource Strain (CARDIA)    Difficulty of Paying Living Expenses: Patient declined  Food Insecurity: Patient Declined (11/28/2023)   Hunger Vital Sign    Worried About Running Out of Food in the Last Year: Patient declined    Ran Out of Food in the Last Year: Patient declined  Transportation Needs: Patient Declined (  11/28/2023)   PRAPARE - Transportation    Lack of Transportation (Medical): Patient declined    Lack of Transportation (Non-Medical): Patient declined  Physical Activity: Patient Declined (11/28/2023)   Exercise Vital Sign    Days of Exercise per Week: Patient declined    Minutes of Exercise per Session: Patient declined  Stress: Patient Declined (11/28/2023)   [employer] of Occupational Health - Occupational Stress Questionnaire    Feeling of Stress : Patient declined  Social Connections: Patient Declined (11/28/2023)   Social Connection and Isolation Panel    Frequency of Communication with Friends and Family: Patient declined    Frequency of Social Gatherings with Friends and Family: Patient declined    Attends Religious Services: Patient declined    Database administrator or Organizations: Patient declined    Attends Engineer, structural: Patient declined    Marital Status: Patient declined     Family History: The patient's family history includes Alzheimer's disease in her maternal grandfather; Bipolar disorder in her daughter; Breast cancer in her cousin; Breast cancer (age of onset: 93) in her paternal grandmother; Breast cancer (age of onset: 45) in her maternal grandmother; Cancer in her maternal grandmother and paternal grandmother; Heart disease in her cousin and father; Heart disease (age of onset: 44) in her sister; Parkinson's disease in her sister.  ROS:   Please  see the history of present illness.     All other systems reviewed and are negative.  EKGs/Labs/Other Studies Reviewed:    The following studies were reviewed today:  EKG Interpretation Date/Time:  Monday February 18 2024 14:22:46 EDT Ventricular Rate:  79 PR Interval:  158 QRS Duration:  78 QT Interval:  404 QTC Calculation: 463 R Axis:   2  Text Interpretation: Normal sinus rhythm Normal ECG Confirmed by Darliss Rogue ([id number]) on 02/18/2024 2:34:52 PM    Recent Labs: 11/27/2023: ALT 17; BUN 13; Creatinine, Ser 0.91; Hemoglobin 12.6; Platelets 306; Sodium 138 12/21/2023: Potassium 4.1 01/16/2024: TSH 2.12  Recent Lipid Panel    Component Value Date/Time   CHOL 221 (H) 11/27/2023 1629   CHOL 155 06/13/2016 0817   TRIG 147 11/27/2023 1629   TRIG 155 (H) 06/13/2016 0817   HDL 61 11/27/2023 1629   CHOLHDL 3.9 08/31/2023 0909   VLDL 24 08/31/2023 0909   VLDL 31 (H) 06/13/2016 0817   LDLCALC 134 (H) 11/27/2023 1629     Risk Assessment/Calculations:             Physical Exam:    VS:  BP 122/72 (BP Location: Left Arm, Patient Position: Sitting, Cuff Size: Normal)   Pulse 79   Ht 5' 4 (1.626 m)   Wt 184 lb 3.2 oz (83.6 kg)   SpO2 99%   BMI 31.62 kg/m     Wt Readings from Last 3 Encounters:  02/18/24 184 lb 3.2 oz (83.6 kg)  12/21/23 179 lb (81.2 kg)  11/27/23 180 lb (81.6 kg)     GEN:  Well nourished, well developed in no acute distress HEENT: Normal NECK: No JVD; No carotid bruits CARDIAC: RRR, no murmurs, rubs, gallops RESPIRATORY:  Clear to auscultation without rales, wheezing or rhonchi  ABDOMEN: Soft, non-tender, non-distended MUSCULOSKELETAL:  No edema; No deformity  SKIN: Warm and dry NEUROLOGIC:  Alert and oriented x 3 PSYCHIATRIC:  Normal affect   ASSESSMENT:    1. Dyspnea on exertion   2. Anginal equivalent (HCC)   3. Mixed hyperlipidemia   4. Chest pain on breathing  PLAN:    In order of problems listed above:  Dyspnea on exertion,  anginal equivalent, several risk factors including hyperlipidemia, family history of early CAD.  Get echocardiogram, get coronary CTA to rule out CAD. Hyperlipidemia, did not tolerate statins on Zetia .  Continue low-cholesterol diet CCTA as above.  Will consider Repatha or Vascepa based on cardiac CT results.   Follow-up after cardiac testing      Medication Adjustments/Labs and Tests Ordered: Current medicines are reviewed at length with the patient today.  Concerns regarding medicines are outlined above.  Orders Placed This Encounter  Procedures   CT CORONARY MORPH W/CTA COR W/SCORE W/CA W/CM &/OR WO/CM   Basic metabolic panel with GFR   EKG 87-Ozji   ECHOCARDIOGRAM COMPLETE   Meds ordered this encounter  Medications   metoprolol  tartrate (LOPRESSOR ) 100 MG tablet    Sig: Take 1 tablet (100 mg total) by mouth once for 1 dose. Take 90 - 120 min prior to procedure    Dispense:  1 tablet    Refill:  0    Patient Instructions  Medication Instructions:  Your physician recommends that you continue on your current medications as directed. Please refer to the Current Medication list given to you today.   *If you need a refill on your cardiac medications before your next appointment, please call your pharmacy*  Lab Work: Your provider would like for you to have following labs drawn today BMP.   If you have labs (blood work) drawn today and your tests are completely normal, you will receive your results only by: MyChart Message (if you have MyChart) OR A paper copy in the mail If you have any lab test that is abnormal or we need to change your treatment, we will call you to review the results.  Testing/Procedures: Your physician has requested that you have an echocardiogram. Echocardiography is a painless test that uses sound waves to create images of your heart. It provides your doctor with information about the size and shape of your heart and how well your heart's chambers and valves  are working.   You may receive an ultrasound enhancing agent through an IV if needed to better visualize your heart during the echo. This procedure takes approximately one hour.  There are no restrictions for this procedure.  This will take place at [address] [facility] [address]) [address], [place] [address]  Please note: We ask at that you not bring children with you during ultrasound (echo/ vascular) testing. Due to room size and safety concerns, children are not allowed in the ultrasound rooms during exams. Our front office staff cannot provide observation of children in our lobby area while testing is being conducted. An adult accompanying a patient to their appointment will only be allowed in the ultrasound room at the discretion of the ultrasound technician under special circumstances. We apologize for any inconvenience.     Your cardiac CT will be scheduled at one of the below locations:   [facility] [address] [place], [place] [address] [phone]  If scheduled at [facility], please arrive to the Heart and Vascular Center 15 mins early for check-in and test prep.  There is spacious parking and easy access to the radiology department from the [facility] Heart and Vascular entrance. Please enter here and check-in with the desk attendant.   Please follow these instructions carefully (unless otherwise directed):  An IV will be required for this test and Nitroglycerin   will be given.  Hold all erectile dysfunction medications at least 3 days (72 hrs) prior to test. (Ie viagra, cialis, sildenafil, tadalafil, etc)   On the Night Before the Test: Be sure to Drink plenty of water . Do not consume any caffeinated/decaffeinated beverages or chocolate 12 hours prior to your test. Do not take any antihistamines 12 hours prior to your test.   On the Day of the Test: Drink plenty of water  until 1 hour prior to the test. Do not eat any  food 1 hour prior to test. You may take your regular medications prior to the test.  Take metoprolol  (Lopressor ) two hours prior to test. If you take Furosemide/Hydrochlorothiazide/Spironolactone/Chlorthalidone, please HOLD on the morning of the test. Patients who wear a continuous glucose monitor MUST remove the device prior to scanning. FEMALES- please wear underwire-free bra if available, avoid dresses & tight clothing       After the Test: Drink plenty of water . After receiving IV contrast, you may experience a mild flushed feeling. This is normal. On occasion, you may experience a mild rash up to 24 hours after the test. This is not dangerous. If this occurs, you can take Benadryl 25 mg, Zyrtec, Claritin, or Allegra and increase your fluid intake. (Patients taking Tikosyn should avoid Benadryl, and may take Zyrtec, Claritin, or Allegra) If you experience trouble breathing, this can be serious. If it is severe call 911 IMMEDIATELY. If it is mild, please call our office.  We will call to schedule your test 2-4 weeks out understanding that some insurance companies will need an authorization prior to the service being performed.   For more information and frequently asked questions, please visit our website : [email or web address]  For non-scheduling related questions, please contact the cardiac imaging nurse navigator should you have any questions/concerns: Cardiac Imaging Nurse Navigators Direct Office Dial: [phone]   For scheduling needs, including cancellations and rescheduling, please call [place], [phone].   Follow-Up: At [facility], you and your health needs are our priority.  As part of our continuing mission to provide you with exceptional heart care, our providers are all part of one team.  This team includes your primary Cardiologist (physician) and Advanced Practice Providers or APPs (Physician Assistants and Nurse Practitioners) who all work  together to provide you with the care you need, when you need it.  Your next appointment:   2 - 3 month(s)  Provider:   You may see Redell Cave, MD or one of the following Advanced Practice Providers on your designated Care Team:   Lonni Meager, NP Lesley Maffucci, PA-C Bernardino Bring, PA-C Cadence [place], PA-C Tylene Lunch, NP Carol Hila, NP      Signed, Redell Cave, MD  02/18/2024 3:14 PM    [facility]

## 2024-02-18 NOTE — Patient Instructions (Addendum)
 Medication Instructions:  Your physician recommends that you continue on your current medications as directed. Please refer to the Current Medication list given to you today.   *If you need a refill on your cardiac medications before your next appointment, please call your pharmacy*  Lab Work: Your provider would like for you to have following labs drawn today BMP.   If you have labs (blood work) drawn today and your tests are completely normal, you will receive your results only by: MyChart Message (if you have MyChart) OR A paper copy in the mail If you have any lab test that is abnormal or we need to change your treatment, we will call you to review the results.  Testing/Procedures: Your physician has requested that you have an echocardiogram. Echocardiography is a painless test that uses sound waves to create images of your heart. It provides your doctor with information about the size and shape of your heart and how well your heart's chambers and valves are working.   You may receive an ultrasound enhancing agent through an IV if needed to better visualize your heart during the echo. This procedure takes approximately one hour.  There are no restrictions for this procedure.  This will take place at [address] [facility] [address] [facility]) [address], [place] [address]  Please note: We ask at that you not bring children with you during ultrasound (echo/ vascular) testing. Due to room size and safety concerns, children are not allowed in the ultrasound rooms during exams. Our front office staff cannot provide observation of children in our lobby area while testing is being conducted. An adult accompanying a patient to their appointment will only be allowed in the ultrasound room at the discretion of the ultrasound technician under special circumstances. We apologize for any inconvenience.     Your cardiac CT will be scheduled at one of the below locations:   [facility] [address] [place], [place] [address] [phone]  If scheduled at [facility], please arrive to the Heart and Vascular Center 15 mins early for check-in and test prep.  There is spacious parking and easy access to the radiology department from the [facility] Heart and Vascular entrance. Please enter here and check-in with the desk attendant.   Please follow these instructions carefully (unless otherwise directed):  An IV will be required for this test and Nitroglycerin  will be given.  Hold all erectile dysfunction medications at least 3 days (72 hrs) prior to test. (Ie viagra, cialis, sildenafil, tadalafil, etc)   On the Night Before the Test: Be sure to Drink plenty of water . Do not consume any caffeinated/decaffeinated beverages or chocolate 12 hours prior to your test. Do not take any antihistamines 12 hours prior to your test.   On the Day of the Test: Drink plenty of water  until 1 hour prior to the test. Do not eat any food 1 hour prior to test. You may take your regular medications prior to the test.  Take metoprolol  (Lopressor ) two hours prior to test. If you take Furosemide/Hydrochlorothiazide/Spironolactone/Chlorthalidone, please HOLD on the morning of the test. Patients who wear a continuous glucose monitor MUST remove the device prior to scanning. FEMALES- please wear underwire-free bra if available, avoid dresses & tight clothing       After the Test: Drink plenty of water . After receiving IV contrast, you may experience a mild flushed feeling. This is normal. On occasion, you may experience a mild rash up to 24  hours after the test. This is not dangerous. If this occurs, you can take Benadryl 25 mg, Zyrtec, Claritin, or Allegra and increase your fluid intake. (Patients taking Tikosyn should avoid Benadryl, and may take Zyrtec, Claritin, or Allegra) If you experience trouble breathing, this can be serious. If it is severe call 911  IMMEDIATELY. If it is mild, please call our office.  We will call to schedule your test 2-4 weeks out understanding that some insurance companies will need an authorization prior to the service being performed.   For more information and frequently asked questions, please visit our website : [email or web address]  For non-scheduling related questions, please contact the cardiac imaging nurse navigator should you have any questions/concerns: Cardiac Imaging Nurse Navigators Direct Office Dial: [phone]   For scheduling needs, including cancellations and rescheduling, please call [place], [phone].   Follow-Up: At [facility], you and your health needs are our priority.  As part of our continuing mission to provide you with exceptional heart care, our providers are all part of one team.  This team includes your primary Cardiologist (physician) and Advanced Practice Providers or APPs (Physician Assistants and Nurse Practitioners) who all work together to provide you with the care you need, when you need it.  Your next appointment:   2 - 3 month(s)  Provider:   You may see Redell Cave, MD or one of the following Advanced Practice Providers on your designated Care Team:   Lonni Meager, NP Lesley Maffucci, PA-C Bernardino Bring, PA-C Cadence [place], PA-C Tylene Lunch, NP Barnie Hila, NP

## 2024-02-19 LAB — BASIC METABOLIC PANEL WITH GFR
BUN/Creatinine Ratio: 15 (ref 12–28)
BUN: 14 mg/dL (ref 8–27)
CO2: 19 mmol/L — ABNORMAL LOW (ref 20–29)
Calcium: 9 mg/dL (ref 8.7–10.3)
Chloride: 101 mmol/L (ref 96–106)
Creatinine, Ser: 0.91 mg/dL (ref 0.57–1.00)
Glucose: 74 mg/dL (ref 70–99)
Potassium: 4.5 mmol/L (ref 3.5–5.2)
Sodium: 137 mmol/L (ref 134–144)
eGFR: 72 mL/min/1.73 (ref >59)

## 2024-02-20 ENCOUNTER — Ambulatory Visit: Admitting: Cardiology

## 2024-03-04 ENCOUNTER — Encounter (HOSPITAL_COMMUNITY): Payer: Self-pay

## 2024-03-05 ENCOUNTER — Telehealth (HOSPITAL_COMMUNITY): Payer: Self-pay | Admitting: *Deleted

## 2024-03-05 NOTE — Telephone Encounter (Signed)

## 2024-03-06 ENCOUNTER — Other Ambulatory Visit (HOSPITAL_COMMUNITY): Payer: Self-pay

## 2024-03-06 ENCOUNTER — Ambulatory Visit
Admission: RE | Admit: 2024-03-06 | Discharge: 2024-03-06 | Disposition: A | Discharge status: Home / Self Care | Source: Ambulatory Visit | Attending: Cardiology | Admitting: Cardiology

## 2024-03-06 DIAGNOSIS — R0609 Other forms of dyspnea: Secondary | ICD-10-CM | POA: Diagnosis present

## 2024-03-06 MED ORDER — IOHEXOL 350 MG/ML SOLN
100.0000 mL | Freq: Once | INTRAVENOUS | Status: AC | PRN
  Administered 2024-03-06: 100 mL via INTRAVENOUS

## 2024-03-06 MED ORDER — NITROGLYCERIN 0.4 MG SL SUBL
0.8000 mg | SUBLINGUAL_TABLET | Freq: Once | SUBLINGUAL | Status: AC
  Administered 2024-03-06: 0.8 mg via SUBLINGUAL

## 2024-03-06 NOTE — Progress Notes (Signed)
 Patient tolerated procedure well. W/C to lobby.  Ambulate w/o difficulty. Denies light headedness or being dizzy. Encouraged to drink extra water today and reasoning explained. Verbalized understanding. All questions answered. ABC intact. No further needs. Discharge from procedure area w/o issues.

## 2024-03-10 NOTE — Progress Notes (Signed)
   NEUROPSYCHOLOGICAL EVALUATION [facility]  Physical Medicine and Rehabilitation     Patient: Carol Becker  MRN: [id number] DOB: 1961/05/27   Service Provider/Clinical Neuropsychologist: Evalene DOROTHA Riff, PsyD  Date of Service: 02/01/24 Start Time: 11 AM End Time: 12 PM  Location of Service:  [facility] Physical Medicine & Rehabilitation Department [place]. [facility] [address], [place] [place], [place] [address] Phone: [phone]   Billing Code/Service: [id number]    Individuals present: Patient, spouse, Provider Laurier DOROTHA Riff, PsyD)  Provider conducted the 60-minute interactive feedback appointment in-person with the patient, accompanied by her spouse with her permission.  The provider reviewed and discussed the results of neuropsychological evaluation. Follow-up interviewing was conducted as needed to refine interpretation of findings as needed. Review of results included overall findings, diagnosis, and treatment planning/recommendations that were derived from integration of patient data, interpretation of standardized rest results and clinical data, and clinical decision making, which are documented in the patient's electronic medical record with the full report (date listed below). A copy of the full report will also be mailed to the patient.   The patient expressed understanding of the information reviewed. The patient was provided opportunity to ask questions which were then answered by the provider. The provider worked collaboratively to tailor treatment recommendations to the patient when possible. The patient was informed they could reach out to the provider should additional questions related to the evaluation arise.    The final neuropsychological evaluation report, documented in the patient's chart (DATE 01/24/24), was amended to reflect any additional information obtained during the feedback appointment including treatment  planning collaboration.    This report was generated using voice recognition software. While this document has been carefully reviewed, transcription errors may be present. I apologize in advance for any inconvenience. Please contact me if further clarification is needed.             Evalene DOROTHA Riff, PsyD             Neuropsychologist

## 2024-03-11 ENCOUNTER — Other Ambulatory Visit (HOSPITAL_COMMUNITY): Payer: Self-pay

## 2024-03-12 ENCOUNTER — Telehealth: Payer: Self-pay

## 2024-03-12 ENCOUNTER — Other Ambulatory Visit (HOSPITAL_COMMUNITY): Payer: Self-pay

## 2024-03-12 NOTE — Telephone Encounter (Signed)
 CURRENT PA IS ACTIVE UNTIL 03/15/2024  PA RENEWAL: Pharmacy Patient Advocate Encounter   Received notification from Onbase that prior authorization for Ozempic  (1 MG/DOSE) 4MG /3ML pen-injectors is required/requested.   Insurance verification completed.   The patient is insured through [employer] .   Per test claim: PA required; PA submitted to above mentioned insurance via Latent Key/confirmation #/EOC [id number] Status is pending

## 2024-03-12 NOTE — Progress Notes (Unsigned)
 Referring:  Vicci Duwaine SQUIBB, DO [address] [place],  [place] [address]  PCP: Vicci Duwaine SQUIBB, DO  Neurology was asked to evaluate Carol Becker, a 63 for a chief complaint of headaches.  Our recommendations of care will be communicated by shared medical record.    CC:  vertigo   History provided from self, husband  HPI:   Consult visit 03/07/2022 Dr. Rush: Medical co-morbidities: DM2, hyperthyroidism, anxiety  The patient presents for evaluation of vertigo which began several years ago. This worsened when she had a lumpectomy in 2022. She has been diagnosed with vestibular migraine, though she reports she has never had a headache associated with it. She does report a history of sinus headaches. Vertigo can last for several hours at a time. It is triggered by busy visual patterns, like grocery stores with multiple items in the aisles. Can be triggered by riding in the car when she does not know which direction it will turn. Vertigo can last several hours and she will sometimes need to sleep it off. Vertigo is associated with nausea, and she takes dramamine and meclizine  as needed. Tried Zofran  previously which was ineffective. She takes nortriptyline  at bedtime which has helped reduce the frequency of her vertigo. She is also on Effexor  for anxiety.  She has a history of BPPV which feels different from her current vertigo. This vertigo is positional and resolves with Epley maneuver. Did vestibular therapy previously including visual fixation exercises, which she did not find helpful.  She has also started to get intermittently disoriented. Has gotten lost while she was driving. Just last week went to the restroom at [facility] and completely forgot where she was.  Also notices a tremor with fine motor movements which occurs when she overexerts herself. Of note, recent TSH was <0.01 and she is currently having her thyroid  medications adjusted.  Vision has gotten blurry over the past year.  Initially was intermittent but now it is all the time.   Update 06/15/2022 JM: Patient returns for 62-month follow-up accompanied by her husband.  At prior visit, venlafaxine  dosage decreased to 150mg  and increase nortriptyline  dosage to 50mg  for possible vestibular migraines, also provided Ubrelvy samples to take at onset of vertigo attacks.    Reports since prior visit, she has not experienced any migraine headaches or vertigo episodes.  She has been tolerating medication dosage adjustments well.  She has not tried [place] as she has not had any additional episodes.   Prior complaints of blurry vision, diagnosed with bilateral cataracts which were removed in October, no longer has blurred vision.  Does have continued cognitive complaints.  Continues to get disoriented while driving.  Has been present over the past 4 to 5 months.  She has been gradually decreasing Klonopin dosage and is hopeful to be able to completely stop by the end of this week.  She is also completely discontinued BuSpar .  Denies any progression since onset.  Of note, prior TSH was <0.01 and is currently awaiting to be seen by [facility] endocrinology for further management but does not have initial evaluation until May.    Update 03/13/2023 JM: Patient returns for follow-up visit accompanied by her husband.  At prior visit, no recent migraines or vertigo episodes, continued on nortriptyline  and venlafaxine  for preventative and Ubrelvy for rescue.  Reports doing very well up until 2 weeks ago, has had 3 vertigo episodes. Still triggered by visual overstimulation.  Associated with nausea/motion sickness sensation, use of Dramamine with some  benefit. Did try [place] but no benefit. Will need to lay down.  Continues on nortriptyline  50 mg and venlafaxine  150 mg. Reports overall not feeling well over the past 2 weeks, has been having mid abdominal pain over the past 2 weeks, is scheduled to see PCP 10/3. Denies any changes to medication or  diet.   Reports continued cognitive issues with some worsening since prior visit. MMSE 30/30. Forgets words frequently, more frequent occurrences of getting lost while driving, forgetting what she is in the middle of doing or what she is looking for (more noticeable at work). Does report history of Alzheimer's in both grandparents and mother.  Has since establish care with endocrinology for hyperthyroidism - recent adjustments made to methimazole  d/t TSH 0.04  Reports being diagnosed with COPD and CAD since prior visit.   Update 09/10/2023 JM: Patient returns for 62-month follow-up visit accompanied by her husband.  She has been having more episodes recently of vertigo and nausea, about 1x per week, will use Dramamine and symptoms were resolved usually by 60 minutes.  She has previously used meclizine  with greater benefit.  Has noticed some issues with motion sickness while riding or driving in a car.  She has not had any actual headaches.  Remains on nortriptyline  50 mg nightly and venlafaxine  150 mg daily. She also mentions issues with stomach spasming sensation which causes nausea and only lasts for very short duration, has been present since she has her gallbladder removed.   She continues to experience memory difficulties such as easily losing her train of thought or word finding difficulty.  Previously ordered MRI brain but was declined by insurance as she did have an MRI completed in 2022 although was not having these issues at that time.  ATN profile was not consistent with Alzheimer's dementia.    Update 03/13/2024 JM: Patient returns for 62-month follow-up visit.    Previously complained of memory difficulties associated with word finding difficulty.  Repeat MRI brain showed generalized cerebral atrophy.  Underwent neurocognitive testing with Dr. Hayden in 01/2024 and noted inconsistencies and suggestive of interference from mental health rather than neurological changes and does not align with  neurocognitive disorder diagnosis.         Current Treatment: Abortive Dramamine  Preventative Nortriptyline  50 mg QHS Effexor  150 mg daily  Prior Therapies                                 Effexor  225 mg daily Nortriptyline  30 mg QHS Gabapentin  600/1200 Seroquel  - tremor Ubrelvy - no benefit     LABS: CBC    Component Value Date/Time   WBC 7.1 11/27/2023 1629   WBC 8.2 09/01/2022 0905   RBC 4.35 11/27/2023 1629   RBC 4.32 09/01/2022 0905   HGB 12.6 11/27/2023 1629   HCT 38.8 11/27/2023 1629   PLT 306 11/27/2023 1629   MCV 89 11/27/2023 1629   MCH 29.0 11/27/2023 1629   MCH 28.2 09/01/2022 0905   MCHC 32.5 11/27/2023 1629   MCHC 32.3 09/01/2022 0905   RDW 14.3 11/27/2023 1629   LYMPHSABS 2.0 11/27/2023 1629   EOSABS 0.1 11/27/2023 1629   BASOSABS 0.1 11/27/2023 1629      Latest Ref Rng & Units 02/18/2024    3:04 PM 12/21/2023    9:05 AM 11/27/2023    4:29 PM  CMP  Glucose 70 - 99 mg/dL 74   85   BUN  8 - 27 mg/dL 14   13   Creatinine 9.42 - 1.00 mg/dL 9.08   9.08   Sodium 865 - 144 mmol/L 137   138   Potassium 3.5 - 5.2 mmol/L 4.5  4.1  3.7   Chloride 96 - 106 mmol/L 101   100   CO2 20 - 29 mmol/L 19   23   Calcium 8.7 - 10.3 mg/dL 9.0   9.3   Total Protein 6.0 - 8.5 g/dL   7.3   Total Bilirubin 0.0 - 1.2 mg/dL   0.3   Alkaline Phos 44 - 121 IU/L   122   AST 0 - 40 IU/L   24   ALT 0 - 32 IU/L   17      IMAGING:  MRI brain 09/03/20: Brain unremarkable. 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         Current Outpatient Medications on File Prior to Visit  Medication Sig Dispense Refill   aspirin EC 81 MG tablet Take 81 mg by mouth daily. Swallow whole.     buPROPion  (WELLBUTRIN  SR) 150 MG 12 hr tablet Take 1 tablet (150 mg total) by mouth 2 (two) times daily. 180 tablet 1   cholestyramine  (QUESTRAN ) 4 g packet Take 1 packet (4 g total) by mouth 3 (three) times daily with meals. 180 each  3   gabapentin  (NEURONTIN ) 800 MG tablet Take 1 pill in the AM and 2 pills in the PM 225 tablet 1   meclizine  (ANTIVERT ) 25 MG tablet Take 1 tablet (25 mg total) by mouth 2 (two) times daily as needed for dizziness. 60 tablet 5   meloxicam  (MOBIC ) 15 MG tablet Take 1 tablet (15 mg total) by mouth daily. 90 tablet 1   methimazole  (TAPAZOLE ) 5 MG tablet Take 1.5 tablets (7.5 mg total) by mouth as directed. 1.5 tablets Monday through Friday and 1 tablet on Saturday and Sunday 130 tablet 2   metoprolol  tartrate (LOPRESSOR ) 100 MG tablet Take 1 tablet (100 mg total) by mouth once for 1 dose. Take 90 - 120 min prior to procedure 1 tablet 0   Multiple Vitamin (MULTIVITAMIN PO) Take by mouth.     nortriptyline  (PAMELOR ) 50 MG capsule Take 1 capsule (50 mg total) by mouth at bedtime. 90 capsule 1   omeprazole  (PRILOSEC) 20 MG capsule Take 20 mg by mouth daily.     scopolamine  (TRANSDERM-SCOP) 1 MG/3DAYS Place 1 patch (1.5 mg total) onto the skin every 3 (three) days. (Patient not taking: Reported on 02/18/2024) 10 patch 0   [START ON 04/19/2024] Semaglutide , 1 MG/DOSE, 4 MG/3ML SOPN Inject 1 mg as directed once a week. 3 mL 0   Semaglutide , 2 MG/DOSE, (OZEMPIC , 2 MG/DOSE,) 8 MG/3ML SOPN Inject 2 mg into the skin once a week. 9 mL 1   Semaglutide ,0.25 or 0.5MG /DOS, (OZEMPIC , 0.25 OR 0.5 MG/DOSE,) 2 MG/3ML SOPN Inject 0.25 mg into the skin once a week for 28 days, THEN 0.5 mg once a week for 28 days. 3 mL 1   venlafaxine  XR (EFFEXOR -XR) 150 MG 24 hr capsule Take 1 capsule (150 mg total) by mouth at bedtime. 90 capsule 1   No current facility-administered medications on file prior to visit.     Allergies: Allergies  Allergen Reactions   Crestor [Rosuvastatin Calcium] Other (See Comments)    Indigestion/ increase fatigue   Ezetimibe      Increase fatigue   Sulfa Antibiotics Nausea Only   Zithromax [  Azithromycin] Other (See Comments)    Not effective    Family History: Migraine or other headaches in  the family:  no Aneurysms in a first degree relative:  no Brain tumors in the family:  no Other neurological illness in the family:   Alzheimer's  Past Medical History: Past Medical History:  Diagnosis Date   Adrenal nodule (HCC)    Anxiety    Arthritis    knees, left thumb, shoulders   Breast cancer (HCC)    Left   Cervical spinal stenosis    Family history of breast cancer    GERD (gastroesophageal reflux disease)    History of hypothyroidism    Hyperlipidemia    poss history of   Hyperthyroidism    Hypothyroidism    Osteopenia    PONV (postoperative nausea and vomiting)    after wisdom teeth   Pre-diabetes    Prediabetes    Restless leg syndrome    Sleep apnea    tested ~2012- lost a lot of weight, no longer has sleep apnea.- per patient   Tremors of nervous system    medication related   Vertigo    Vestibular migraine     Past Surgical History Past Surgical History:  Procedure Laterality Date   ANTERIOR / POSTERIOR COMBINED FUSION CERVICAL SPINE     BREAST BIOPSY Left 03/15/2021   Stereo bx, Ribbon Clip, path pending   BREAST LUMPECTOMY Left    CHOLECYSTECTOMY N/A 09/01/2022   Procedure: LAPAROSCOPIC CHOLECYSTECTOMY;  Surgeon: Dasie Leonor CROME, MD;  Location: [facility] OR;  Service: General;  Laterality: N/A;   COLONOSCOPY WITH PROPOFOL  N/A 11/05/2020   Procedure: COLONOSCOPY WITH PROPOFOL ;  Surgeon: Jinny Carmine, MD;  Location: [facility];  Service: Endoscopy;  Laterality: N/A;   CYSTECTOMY Left    KNEE ARTHROSCOPY WITH EXCISION BAKER'S CYST Left    LAPAROSCOPY     Exploratory   POLYPECTOMY  11/05/2020   Procedure: POLYPECTOMY;  Surgeon: Jinny Carmine, MD;  Location: [facility];  Service: Endoscopy;;   SHOULDER ARTHROSCOPY WITH ROTATOR CUFF REPAIR Left 12/01/2021   Procedure: Left shoulder arthroscopy, debridement, subacromial decompression, distal clavicle resection, chondroplasty;  Surgeon: Melita Drivers, MD;  Location: [facility];  Service:  Orthopedics;  Laterality: Left;    TOTAL MASTECTOMY Bilateral    Per patient Tummy tissue reconstructive surgery same time   TOTAL SHOULDER ARTHROPLASTY Left 03/16/2022   Procedure: TOTAL SHOULDER ARTHROPLASTY;  Surgeon: Melita Drivers, MD;  Location: [facility];  Service: Orthopedics;  Laterality: Left;   trapeziectomy Left    done at [facility] 06/16/20   TUBAL LIGATION     WISDOM TOOTH EXTRACTION      Social History: Social History   Tobacco Use   Smoking status: Former    Current packs/day: 0.00    Types: Cigarettes    Start date: 04/05/1981    Quit date: 04/06/2011    Years since quitting: 12.9   Smokeless tobacco: Never  Vaping Use   Vaping status: Former   Quit date: 04/05/2013  Substance Use Topics   Alcohol use: Not Currently    Comment: On Occasion   Drug use: No    ROS: Negative for fevers, chills. Positive for vertigo. All other systems reviewed and negative unless stated otherwise in HPI.   Physical Exam:   Vital Signs: There were no vitals taken for this visit. GENERAL: well appearing,in no acute distress,alert SKIN:  Color, texture, turgor normal. No rashes or lesions HEAD:  Normocephalic/atraumatic. CV:  RRR RESP: Normal respiratory effort MSK: no tenderness to palpation over occiput, neck, or shoulders  NEUROLOGICAL: Mental Status: Alert, oriented to person, place and time, subjective short-term memory loss, follows commands Cranial Nerves: PERRL, visual fields intact to confrontation, extraocular movements intact, facial sensation intact, no facial droop or ptosis, hearing grossly intact, no dysarthria Motor: muscle strength 5/5 both upper and lower extremities. No tremor Reflexes: 2+ throughout Sensation: intact to light touch all 4 extremities Coordination: Finger-to- nose-finger intact bilaterally Gait: normal-based      03/13/2023   10:27 AM  MMSE - Mini Mental State Exam  Orientation to time 5  Orientation to Place 5  Registration 3   Attention/ Calculation 5  Recall 3  Language- name 2 objects 2  Language- repeat 1  Language- follow 3 step command 3  Language- read & follow direction 1  Write a sentence 1  Copy design 1  Total score 30        IMPRESSION: 63 year old female with a history of DM2, hyperthyroidism, anxiety who returns for follow-up  vertigo. Per Dr. Rush While vertigo is not associated with headache, she does report a history of 'sinus headaches'. Associated phonophobia and prolonged duration of vertigo episodes does raise suspicion for vestibular migraines.  Symptoms relatively well-controlled on nortriptyline  and venlafaxine , can have vertigo with nausea about once per week, he is on meclizine  with benefit.  Continues to complain of cognitive difficulties associated with word finding difficulty, repeat MRI brain 09/2023 with mild age advanced changes of generalized atrophy. Reports fm hx of Alzheimer's dementia although biomarkers in 03/2023 not consistent with Alzheimer's disease.    PLAN:  -if MR brain and lab work unremarkable, can consider neurocognitive evaluation -Continue nortriptyline  50 mg nightly and venlafaxine  150 mg daily -Restart meclizine  12.5 mg to 25 mg BID PRN -Continue to follow with endocrinology for hyperthyroidism  -Advised to follow-up with PCP regarding stomach spasming sensation, consider GI eval but will defer to PCP     Follow-up in 6 months or call earlier if needed     I personally spent a total of *** minutes in the care of the patient today including {Time Based Coding:210964241}.   Harlene Bogaert, AGNP-BC  [facility] [address] [place], [place] [address]  Phone [phone] Fax [phone] Note: This document was prepared with digital dictation and possible smart phrase technology. Any transcriptional errors that result from this process are unintentional.

## 2024-03-13 ENCOUNTER — Encounter: Payer: Self-pay | Admitting: Adult Health

## 2024-03-13 ENCOUNTER — Encounter: Payer: Self-pay | Admitting: Family Medicine

## 2024-03-13 ENCOUNTER — Ambulatory Visit: Payer: Self-pay | Admitting: Cardiology

## 2024-03-13 ENCOUNTER — Ambulatory Visit: Admitting: Adult Health

## 2024-03-13 VITALS — BP 146/85 | HR 82 | Ht 64.0 in | Wt 186.0 lb

## 2024-03-13 DIAGNOSIS — G43809 Other migraine, not intractable, without status migrainosus: Secondary | ICD-10-CM

## 2024-03-13 DIAGNOSIS — R419 Unspecified symptoms and signs involving cognitive functions and awareness: Secondary | ICD-10-CM

## 2024-03-13 DIAGNOSIS — R42 Dizziness and giddiness: Secondary | ICD-10-CM | POA: Diagnosis not present

## 2024-03-13 MED ORDER — RIZATRIPTAN BENZOATE 10 MG PO TBDP: 10.0000 mg | ORAL_TABLET | ORAL | 11 refills | Status: AC | PRN

## 2024-03-13 MED ORDER — RIZATRIPTAN BENZOATE 10 MG PO TBDP: 10.0000 mg | ORAL_TABLET | ORAL | 11 refills | Status: DC | PRN

## 2024-03-13 MED ORDER — MECLIZINE HCL 25 MG PO TABS: 25.0000 mg | ORAL_TABLET | Freq: Two times a day (BID) | ORAL | 11 refills | Status: AC | PRN

## 2024-03-13 NOTE — Patient Instructions (Addendum)
 Your Plan:  Continue nortriptyline  and venlafaxine  at current dosages  Continue meclizine  as needed for vertigo/migraine  Recommend trial of rizatriptan  as needed for migraine rescue, can repeat after 2 hours if needed      Follow-up in 1 year or call earlier if needed      Thank you for coming to see us  at [facility]. I hope we have been able to provide you high quality care today.  You may receive a patient satisfaction survey over the next few weeks. We would appreciate your feedback and comments so that we may continue to improve ourselves and the health of our patients.

## 2024-03-14 ENCOUNTER — Ambulatory Visit: Attending: Cardiology

## 2024-03-14 DIAGNOSIS — R0609 Other forms of dyspnea: Secondary | ICD-10-CM

## 2024-03-14 LAB — ECHOCARDIOGRAM COMPLETE
AR max vel: 3.21 cm2
AV Area VTI: 3.16 cm2
AV Area mean vel: 2.74 cm2
AV Mean grad: 4 mmHg
AV Peak grad: 7.7 mmHg
Ao pk vel: 1.39 m/s
Area-P 1/2: 3.34 cm2
Calc EF: 55.8 %
MV VTI: 3.08 cm2
S' Lateral: 2.9 cm
Single Plane A2C EF: 56 %
Single Plane A4C EF: 55.2 %

## 2024-03-16 MED ORDER — SCOPOLAMINE 1 MG/3DAYS TD PT72: 1.0000 | MEDICATED_PATCH | TRANSDERMAL | 0 refills | Status: AC

## 2024-03-18 NOTE — Telephone Encounter (Signed)
 Pharmacy Patient Advocate Encounter  Received notification from [employer] that Prior Authorization for Ozempic  has been APPROVED from 03/12/2024 to 03/17/2024   PA #/Case ID/Reference #: [id number]

## 2024-03-25 ENCOUNTER — Telehealth: Admitting: Family Medicine

## 2024-03-25 DIAGNOSIS — Z538 Procedure and treatment not carried out for other reasons: Secondary | ICD-10-CM

## 2024-03-25 NOTE — Progress Notes (Signed)
 Has been back on her ozempic  for about a month doing well. Will hold on appointment today and recheck as scheduled on November

## 2024-03-31 ENCOUNTER — Encounter: Payer: Self-pay | Admitting: Family Medicine

## 2024-04-07 ENCOUNTER — Other Ambulatory Visit: Payer: Self-pay | Admitting: Family Medicine

## 2024-04-07 NOTE — Telephone Encounter (Unsigned)
 Copied from CRM #[id number]. Topic: Clinical - Medication Refill >> Apr 07, 2024  2:58 PM Mia F wrote: Medication: Semaglutide ,0.25 or 0.5MG /DOS, (OZEMPIC , 0.25 OR 0.5 MG/DOSE,) 2 MG/3ML SOPN   Has the patient contacted their pharmacy? No (Agent: If no, request that the patient contact the pharmacy for the refill. If patient does not wish to contact the pharmacy document the reason why and proceed with request.) (Agent: If yes, when and what did the pharmacy advise?)  This is the patient's preferred pharmacy:  [id number] [place], [place] - [address] AT [facility] OF [address] [place] [address] Phone: [phone] Fax: [phone]  Is this the correct pharmacy for this prescription? Yes If no, delete pharmacy and type the correct one.   Has the prescription been filled recently? Yes  Is the patient out of the medication? Yes  Has the patient been seen for an appointment in the last year OR does the patient have an upcoming appointment? Yes  Can we respond through MyChart? Yes  Agent: Please be advised that Rx refills may take up to 3 business days. We ask that you follow-up with your pharmacy.

## 2024-04-09 MED ORDER — SEMAGLUTIDE (1 MG/DOSE) 4 MG/3ML ~~LOC~~ SOPN: 1.0000 mg | PEN_INJECTOR | SUBCUTANEOUS | 0 refills | Status: DC

## 2024-04-09 NOTE — Telephone Encounter (Signed)
 Too soon for refill, LRF 02/18/24.  Requested Prescriptions  Pending Prescriptions Disp Refills   Semaglutide ,0.25 or 0.5MG /DOS, (OZEMPIC , 0.25 OR 0.5 MG/DOSE,) 2 MG/3ML SOPN 3 mL 1    Sig: Inject 0.25 mg into the skin once a week for 28 days, THEN 0.5 mg once a week for 28 days.     Endocrinology:  Diabetes - GLP-1 Receptor Agonists - semaglutide  Passed - 04/09/2024 12:21 PM      Passed - HBA1C in normal range and within 180 days    HB A1C (BAYER DCA - WAIVED)  Date Value Ref Range Status  11/27/2023 5.3 4.8 - 5.6 % Final    Comment:             Prediabetes: 5.7 - 6.4          Diabetes: >6.4          Glycemic control for adults with diabetes: <7.0          Passed - Cr in normal range and within 360 days    Creatinine, Ser  Date Value Ref Range Status  02/18/2024 0.91 0.57 - 1.00 mg/dL Final         Passed - Valid encounter within last 6 months    Recent Outpatient Visits           2 weeks ago Appointment canceled by hospital   [facility] [facility] [place], Megan P, DO   4 months ago Routine general medical examination at a health care facility   [facility] [place], [place], DO   5 months ago Diaphoresis   [place] [facility] [place], Duwaine SQUIBB, DO       Future Appointments             In 3 weeks Agbor-Etang, Red[place], MD [facility]

## 2024-04-09 NOTE — Addendum Note (Signed)
 Addended by: Kagan Hietpas M on: 04/09/2024 12:59 PM   Modules accepted: Orders

## 2024-05-02 ENCOUNTER — Ambulatory Visit: Attending: Cardiology | Admitting: Cardiology

## 2024-05-02 ENCOUNTER — Encounter: Payer: Self-pay | Admitting: Cardiology

## 2024-05-02 VITALS — BP 125/70 | HR 90 | Ht 64.0 in | Wt 191.8 lb

## 2024-05-02 DIAGNOSIS — E782 Mixed hyperlipidemia: Secondary | ICD-10-CM | POA: Diagnosis not present

## 2024-05-02 DIAGNOSIS — R0609 Other forms of dyspnea: Secondary | ICD-10-CM

## 2024-05-02 MED ORDER — ICOSAPENT ETHYL 1 G PO CAPS: 2.0000 g | ORAL_CAPSULE | Freq: Two times a day (BID) | ORAL | 1 refills | Status: AC

## 2024-05-02 NOTE — Patient Instructions (Signed)
 Medication Instructions:  - START vascepa 2 grams twice daily   *If you need a refill on your cardiac medications before your next appointment, please call your pharmacy*  Lab Work: Your provider would like for you to return in 5 months to have the following labs drawn: lipid panel.   Please go to [facility] [address] [address], [place] [address] You do not need an appointment.  They are open from 8 am- 4:30 pm.  Lunch from 1:00 pm- 2:00 pm You do need to be fasting. If you have labs (blood work) drawn today and your tests are completely normal, you will receive your results only by: MyChart Message (if you have MyChart) OR A paper copy in the mail If you have any lab test that is abnormal or we need to change your treatment, we will call you to review the results.  Testing/Procedures: No test ordered today   Follow-Up: At [facility], you and your health needs are our priority.  As part of our continuing mission to provide you with exceptional heart care, our providers are all part of one team.  This team includes your primary Cardiologist (physician) and Advanced Practice Providers or APPs (Physician Assistants and Nurse Practitioners) who all work together to provide you with the care you need, when you need it.  Your next appointment:   6 month(s)  Provider:   You may see Dr Darliss or one of the following Advanced Practice Providers on your designated Care Team:   Lonni Meager, NP Lesley Maffucci, PA-C Bernardino Bring, PA-C Cadence [place], PA-C Tylene Lunch, NP Barnie Hila, NP    We recommend signing up for the patient portal called MyChart.  Sign up information is provided on this After Visit Summary.  MyChart is used to connect with patients for Virtual Visits (Telemedicine).  Patients are able to view lab/test results, encounter notes, upcoming appointments, etc.  Non-urgent messages can be sent to your provider as  well.   To learn more about what you can do with MyChart, go to [email or web address].

## 2024-05-02 NOTE — Progress Notes (Signed)
 Cardiology Office Note:    Date:  05/02/2024   ID:  Carol Becker, DOB 1960-12-13, MRN [id number]  PCP:  Vicci Duwaine SQUIBB, DO   [facility] Providers Cardiologist:  None     Referring MD: Vicci Duwaine SQUIBB, DO   Chief Complaint  Patient presents with   Follow-up    Pt fatigue.    History of Present Illness:    Carol Becker is a 63 y.o. female with a hx of hyperlipidemia, anxiety, former smoker x 20 years, who presents for follow-up.  She was previously seen for exertional fatigue.  Echo and coronary CTA obtained to evaluate cardiac etiology.  States feeling the same.  Denies chest pain.  Complains of left arm weakness, pain, history of left shoulder surgery.  Prior notes/testing Sister had several stents in her 19s, patient's father had CABG in his 44s.  Intolerance to statins or Zetia .  Denies overt chest pain.    Past Medical History:  Diagnosis Date   Adrenal nodule    Anxiety    Arthritis    knees, left thumb, shoulders   Breast cancer (HCC)    Left   Cervical spinal stenosis    Family history of breast cancer    GERD (gastroesophageal reflux disease)    History of hypothyroidism    Hyperlipidemia    poss history of   Hyperthyroidism    Hypothyroidism    Osteopenia    PONV (postoperative nausea and vomiting)    after wisdom teeth   Pre-diabetes    Prediabetes    Restless leg syndrome    Sleep apnea    tested ~2012- lost a lot of weight, no longer has sleep apnea.- per patient   Tremors of nervous system    medication related   Vertigo    Vestibular migraine     Past Surgical History:  Procedure Laterality Date   ANTERIOR / POSTERIOR COMBINED FUSION CERVICAL SPINE     BREAST BIOPSY Left 03/15/2021   Stereo bx, Ribbon Clip, path pending   BREAST LUMPECTOMY Left    CHOLECYSTECTOMY N/A 09/01/2022   Procedure: LAPAROSCOPIC CHOLECYSTECTOMY;  Surgeon: Dasie Leonor CROME, MD;  Location: [facility] OR;  Service: General;  Laterality: N/A;    COLONOSCOPY WITH PROPOFOL  N/A 11/05/2020   Procedure: COLONOSCOPY WITH PROPOFOL ;  Surgeon: Jinny Carmine, MD;  Location: [facility];  Service: Endoscopy;  Laterality: N/A;   CYSTECTOMY Left    KNEE ARTHROSCOPY WITH EXCISION BAKER'S CYST Left    LAPAROSCOPY     Exploratory   POLYPECTOMY  11/05/2020   Procedure: POLYPECTOMY;  Surgeon: Jinny Carmine, MD;  Location: [facility];  Service: Endoscopy;;   SHOULDER ARTHROSCOPY WITH ROTATOR CUFF REPAIR Left 12/01/2021   Procedure: Left shoulder arthroscopy, debridement, subacromial decompression, distal clavicle resection, chondroplasty;  Surgeon: Melita Drivers, MD;  Location: [facility] ORS;  Service: Orthopedics;  Laterality: Left;    TOTAL MASTECTOMY Bilateral    Per patient Tummy tissue reconstructive surgery same time   TOTAL SHOULDER ARTHROPLASTY Left 03/16/2022   Procedure: TOTAL SHOULDER ARTHROPLASTY;  Surgeon: Melita Drivers, MD;  Location: [facility] OR[facility];  Service: Orthopedics;  Laterality: Left;   trapeziectomy Left    done at [facility] 06/16/20   TUBAL LIGATION     WISDOM TOOTH EXTRACTION      Current Medications: Current Meds  Medication Sig   aspirin EC 81 MG tablet Take 81 mg by mouth daily. Swallow whole.   buPROPion  (WELLBUTRIN  SR) 150 MG 12 hr tablet  Take 1 tablet (150 mg total) by mouth 2 (two) times daily.   cholestyramine  (QUESTRAN ) 4 g packet Take 1 packet (4 g total) by mouth 3 (three) times daily with meals.   icosapent Ethyl (VASCEPA) 1 g capsule Take 2 capsules (2 g total) by mouth 2 (two) times daily.   meclizine  (ANTIVERT ) 25 MG tablet Take 1 tablet (25 mg total) by mouth 2 (two) times daily as needed for dizziness.   meloxicam  (MOBIC ) 15 MG tablet Take 1 tablet (15 mg total) by mouth daily.   methimazole  (TAPAZOLE ) 5 MG tablet Take 1.5 tablets (7.5 mg total) by mouth as directed. 1.5 tablets Monday through Friday and 1 tablet on Saturday and Sunday   Multiple Vitamin (MULTIVITAMIN PO) Take by mouth.   nortriptyline   (PAMELOR ) 50 MG capsule Take 1 capsule (50 mg total) by mouth at bedtime.   omeprazole  (PRILOSEC) 20 MG capsule Take 20 mg by mouth daily.   rizatriptan  (MAXALT -MLT) 10 MG disintegrating tablet Take 1 tablet (10 mg total) by mouth as needed for migraine. May repeat in 2 hours if needed   scopolamine  (TRANSDERM-SCOP) 1 MG/3DAYS Place 1 patch (1 mg total) onto the skin every 3 (three) days.   Semaglutide , 1 MG/DOSE, 4 MG/3ML SOPN Inject 1 mg as directed once a week.   Semaglutide , 2 MG/DOSE, (OZEMPIC , 2 MG/DOSE,) 8 MG/3ML SOPN Inject 2 mg into the skin once a week.   venlafaxine  XR (EFFEXOR -XR) 150 MG 24 hr capsule Take 1 capsule (150 mg total) by mouth at bedtime.     Allergies:   Crestor [rosuvastatin calcium], Ezetimibe , Sulfa antibiotics, and Zithromax [azithromycin]   Social History   Socioeconomic History   Marital status: Married    Spouse name: Not on file   Number of children: 2   Years of education: Not on file   Highest education level: 12th grade  Occupational History   Not on file  Tobacco Use   Smoking status: Former    Current packs/day: 0.00    Types: Cigarettes    Start date: 04/05/1981    Quit date: 04/06/2011    Years since quitting: 13.0   Smokeless tobacco: Never  Vaping Use   Vaping status: Former   Quit date: 04/05/2013  Substance and Sexual Activity   Alcohol use: Not Currently    Comment: On Occasion   Drug use: No   Sexual activity: Yes    Birth control/protection: Post-menopausal  Other Topics Concern   Not on file  Social History Narrative   Lives in [place] with husband; quit smoking- 30 years; no alcohol abuse. 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]   Social Drivers of Corporate investment banker Strain: Patient Declined (03/24/2024)   Overall Financial Resource Strain (CARDIA)    Difficulty of Paying Living Expenses: Patient declined  Food Insecurity: Patient Declined (03/24/2024)   Hunger Vital Sign     Worried About Running Out of Food in the Last Year: Patient declined    Ran Out of Food in the Last Year: Patient declined  Transportation Needs: No Transportation Needs (03/24/2024)   PRAPARE - Administrator, Civil Service (Medical): No    Lack of Transportation (Non-Medical): No  Physical Activity: Insufficiently Active (03/24/2024)   Exercise Vital Sign    Days of Exercise per Week: 2 days    Minutes of Exercise per Session: 10 min  Stress: Stress Concern Present (03/24/2024)   [employer] of Occupational Health - Occupational Stress  Questionnaire    Feeling of Stress: To some extent  Social Connections: Unknown (03/24/2024)   Social Connection and Isolation Panel    Frequency of Communication with Friends and Family: Patient declined    Frequency of Social Gatherings with Friends and Family: Patient declined    Attends Religious Services: Patient declined    Database administrator or Organizations: Patient declined    Attends Engineer, structural: Not on file    Marital Status: Patient declined     Family History: The patient's family history includes Alzheimer's disease in her maternal grandfather; Bipolar disorder in her daughter; Breast cancer in her cousin; Breast cancer (age of onset: 83) in her paternal grandmother; Breast cancer (age of onset: 86) in her maternal grandmother; Cancer in her maternal grandmother and paternal grandmother; Heart disease in her cousin and father; Heart disease (age of onset: 16) in her sister; Parkinson's disease in her sister.  ROS:   Please see the history of present illness.     All other systems reviewed and are negative.  EKGs/Labs/Other Studies Reviewed:    The following studies were reviewed today:       Recent Labs: 11/27/2023: ALT 17; Hemoglobin 12.6; Platelets 306 01/16/2024: TSH 2.12 02/18/2024: BUN 14; Creatinine, Ser 0.91; Potassium 4.5; Sodium 137  Recent Lipid Panel    Component Value Date/Time   CHOL  221 (H) 11/27/2023 1629   CHOL 155 06/13/2016 0817   TRIG 147 11/27/2023 1629   TRIG 155 (H) 06/13/2016 0817   HDL 61 11/27/2023 1629   CHOLHDL 3.9 08/31/2023 0909   VLDL 24 08/31/2023 0909   VLDL 31 (H) 06/13/2016 0817   LDLCALC 134 (H) 11/27/2023 1629     Risk Assessment/Calculations:             Physical Exam:    VS:  BP 125/70 (BP Location: Right Arm, Patient Position: Sitting, Cuff Size: Normal)   Pulse 90   Ht 5' 4 (1.626 m)   Wt 191 lb 12.8 oz (87 kg)   SpO2 98%   BMI 32.92 kg/m     Wt Readings from Last 3 Encounters:  05/02/24 191 lb 12.8 oz (87 kg)  03/13/24 186 lb (84.4 kg)  02/18/24 184 lb 3.2 oz (83.6 kg)     GEN:  Well nourished, well developed in no acute distress HEENT: Normal NECK: No JVD; No carotid bruits CARDIAC: RRR, no murmurs, rubs, gallops RESPIRATORY:  Clear to auscultation without rales, wheezing or rhonchi  ABDOMEN: Soft, non-tender, non-distended MUSCULOSKELETAL:  No edema; No deformity  SKIN: Warm and dry NEUROLOGIC:  Alert and oriented x 3 PSYCHIATRIC:  Normal affect   ASSESSMENT:    1. Dyspnea on exertion   2. Mixed hyperlipidemia     PLAN:    In order of problems listed above:  Dyspnea on exertion, CCTA 8/25 minimal LAD calcification, less than 25% stenosis.  Echo 9/25 EF 60 to 65%.  Patient made aware of results, reassured.  Dyspnea likely from deconditioning.  Increase activity/exercise advised. Hyperlipidemia, did not tolerate statins or Zetia .  Start Vascepa 2 g twice daily.  Repeat lipid panel in 5 months. Left arm pain, weakness, difficulty lifting arm.  History of left shoulder surgery.  Advised patient to ask for input from neurology, neurosurgery which she already has appointment with.  Follow-up in 6 months.      Medication Adjustments/Labs and Tests Ordered: Current medicines are reviewed at length with the patient today.  Concerns regarding medicines are  outlined above.  Orders Placed This Encounter   Procedures   Lipid panel   Meds ordered this encounter  Medications   icosapent Ethyl (VASCEPA) 1 g capsule    Sig: Take 2 capsules (2 g total) by mouth 2 (two) times daily.    Dispense:  360 capsule    Refill:  1    Patient Instructions  Medication Instructions:  - START vascepa 2 grams twice daily   *If you need a refill on your cardiac medications before your next appointment, please call your pharmacy*  Lab Work: Your provider would like for you to return in 5 months to have the following labs drawn: lipid panel.   Please go to [facility] [address] ([facility] [address], [place] [address] You do not need an appointment.  They are open from 8 am- 4:30 pm.  Lunch from 1:00 pm- 2:00 pm You do need to be fasting. If you have labs (blood work) drawn today and your tests are completely normal, you will receive your results only by: MyChart Message (if you have MyChart) OR A paper copy in the mail If you have any lab test that is abnormal or we need to change your treatment, we will call you to review the results.  Testing/Procedures: No test ordered today   Follow-Up: At [facility], you and your health needs are our priority.  As part of our continuing mission to provide you with exceptional heart care, our providers are all part of one team.  This team includes your primary Cardiologist (physician) and Advanced Practice Providers or APPs (Physician Assistants and Nurse Practitioners) who all work together to provide you with the care you need, when you need it.  Your next appointment:   6 month(s)  Provider:   You may see Dr Darliss or one of the following Advanced Practice Providers on your designated Care Team:   Lonni Meager, NP Lesley Maffucci, PA-C Bernardino Bring, PA-C Cadence Camden-on-Gauley, PA-C Tylene Lunch, NP Barnie Hila, NP    We recommend signing up for the patient portal called MyChart.  Sign up information is  provided on this After Visit Summary.  MyChart is used to connect with patients for Virtual Visits (Telemedicine).  Patients are able to view lab/test results, encounter notes, upcoming appointments, etc.  Non-urgent messages can be sent to your provider as well.   To learn more about what you can do with MyChart, go to [email or web address].             Signed, Redell Darliss, MD  05/02/2024 10:35 AM    [facility]

## 2024-05-30 ENCOUNTER — Ambulatory Visit: Admitting: Family Medicine

## 2024-05-30 ENCOUNTER — Encounter: Payer: Self-pay | Admitting: Family Medicine

## 2024-05-30 VITALS — BP 115/76 | HR 79 | Temp 98.0°F | Ht 64.0 in | Wt 182.0 lb

## 2024-05-30 DIAGNOSIS — I2510 Atherosclerotic heart disease of native coronary artery without angina pectoris: Secondary | ICD-10-CM | POA: Diagnosis not present

## 2024-05-30 DIAGNOSIS — I700 Atherosclerosis of aorta: Secondary | ICD-10-CM

## 2024-05-30 DIAGNOSIS — J439 Emphysema, unspecified: Secondary | ICD-10-CM

## 2024-05-30 DIAGNOSIS — G720 Drug-induced myopathy: Secondary | ICD-10-CM

## 2024-05-30 DIAGNOSIS — E1149 Type 2 diabetes mellitus with other diabetic neurological complication: Secondary | ICD-10-CM | POA: Diagnosis not present

## 2024-05-30 DIAGNOSIS — E782 Mixed hyperlipidemia: Secondary | ICD-10-CM

## 2024-05-30 DIAGNOSIS — F411 Generalized anxiety disorder: Secondary | ICD-10-CM

## 2024-05-30 DIAGNOSIS — E0590 Thyrotoxicosis, unspecified without thyrotoxic crisis or storm: Secondary | ICD-10-CM | POA: Diagnosis not present

## 2024-05-30 DIAGNOSIS — Z23 Encounter for immunization: Secondary | ICD-10-CM | POA: Diagnosis not present

## 2024-05-30 DIAGNOSIS — Z7985 Long-term (current) use of injectable non-insulin antidiabetic drugs: Secondary | ICD-10-CM

## 2024-05-30 DIAGNOSIS — E559 Vitamin D deficiency, unspecified: Secondary | ICD-10-CM

## 2024-05-30 DIAGNOSIS — T466X5A Adverse effect of antihyperlipidemic and antiarteriosclerotic drugs, initial encounter: Secondary | ICD-10-CM

## 2024-05-30 LAB — BAYER DCA HB A1C WAIVED: HB A1C (BAYER DCA - WAIVED): 5.5 % (ref 4.8–5.6)

## 2024-05-30 MED ORDER — NORTRIPTYLINE HCL 50 MG PO CAPS: 50.0000 mg | ORAL_CAPSULE | Freq: Every day | ORAL | 1 refills | Status: AC

## 2024-05-30 MED ORDER — BUPROPION HCL ER (SR) 150 MG PO TB12: 150.0000 mg | ORAL_TABLET | Freq: Two times a day (BID) | ORAL | 1 refills | Status: AC

## 2024-05-30 MED ORDER — GABAPENTIN 800 MG PO TABS
1600.0000 mg | ORAL_TABLET | Freq: Every evening | ORAL | 1 refills | Status: AC | PRN
Start: 2024-05-30 — End: ?

## 2024-05-30 MED ORDER — MELOXICAM 15 MG PO TABS: 15.0000 mg | ORAL_TABLET | Freq: Every day | ORAL | 1 refills | Status: AC

## 2024-05-30 MED ORDER — VENLAFAXINE HCL ER 150 MG PO CP24: 150.0000 mg | ORAL_CAPSULE | Freq: Every day | ORAL | 1 refills | Status: AC

## 2024-05-30 MED ORDER — SEMAGLUTIDE (1 MG/DOSE) 4 MG/3ML ~~LOC~~ SOPN: 1.0000 mg | PEN_INJECTOR | SUBCUTANEOUS | 1 refills | Status: AC

## 2024-05-30 NOTE — Assessment & Plan Note (Signed)
Will keep BP, sugars and cholesterol under good control. Continue to monitor. Call with any concerns.  

## 2024-05-30 NOTE — Assessment & Plan Note (Signed)
 Doing great with A1c of 5.5. Continue current regimen. Continue to monitor. Call with any concerns.

## 2024-05-30 NOTE — Assessment & Plan Note (Signed)
 Rechecking labs today. Await results. Treat as needed.

## 2024-05-30 NOTE — Assessment & Plan Note (Signed)
Doing well off medicine. Continue to monitor. Call with any concerns.  

## 2024-05-30 NOTE — Assessment & Plan Note (Signed)
 Labs drawn today. Await results. Treat as needed.

## 2024-05-30 NOTE — Progress Notes (Signed)
 BP 115/76   Pulse 79   Temp 98 F (36.7 C) (Oral)   Ht 5' 4 (1.626 m)   Wt 182 lb (82.6 kg)   SpO2 98%   BMI 31.24 kg/m    Subjective:    Patient ID: Carol Becker, female    DOB: [birth year], 63 y.o.   MRN: [id number]  HPI: Carol Becker is a 63 y.o. female  Chief Complaint  Patient presents with   Diabetes   Hyperlipidemia   Anxiety   Restless Legs    Fatigue   DIABETES Hypoglycemic episodes:no Polydipsia/polyuria: no Visual disturbance: no Chest pain: no Paresthesias: no Glucose Monitoring: no Taking Insulin?: no Blood Pressure Monitoring: rarely Retinal Examination: Not up to Date Foot Exam: Up to Date Diabetic Education: Completed Pneumovax: Up to Date Influenza: Up to Date Aspirin: yes  HYPERLIPIDEMIA Hyperlipidemia status: excellent compliance Satisfied with current treatment?  yes Side effects:  no Medication compliance: excellent compliance Past cholesterol meds: statins cause myalgias, lovaza Supplements: fish oil Aspirin:  no The 10-year ASCVD risk score (Arnett DK, et al., 2019) is: 6.4%   Values used to calculate the score:     Age: 48 years     Clincally relevant sex: Female     Is Non-Hispanic African American: No     Diabetic: Yes     Tobacco smoker: No     Systolic Blood Pressure: 115 mmHg     Is BP treated: No     HDL Cholesterol: 61 mg/dL     Total Cholesterol: 221 mg/dL Chest pain:  no Coronary artery disease:  yes  DEPRESSION Mood status: controlled Satisfied with current treatment?: yes Symptom severity: mild  Duration of current treatment : chronic Side effects: no Medication compliance: excellent compliance Psychotherapy/counseling: no  Previous psychiatric medications: effexor , wellbutrin  Depressed mood: no Anxious mood: no Anhedonia: no Significant weight loss or gain: no Insomnia: no  Fatigue: no Feelings of worthlessness or guilt: no Impaired concentration/indecisiveness: no Suicidal ideations:  no Hopelessness: no Crying spells: no    05/30/2024    9:22 AM 11/28/2023    8:58 AM 10/26/2023    8:27 AM 08/31/2022    3:07 PM 02/24/2022    2:45 PM  Depression screen PHQ 2/9  Decreased Interest 0 0 0 0 0  Down, Depressed, Hopeless 0 0 0 0 0  PHQ - 2 Score 0 0 0 0 0  Altered sleeping 0 0 0 1 0  Tired, decreased energy 0 3 3 0 1  Change in appetite 0 0 0 0 0  Feeling bad or failure about yourself  0 0 0 0 0  Trouble concentrating 0 0 0 0 0  Moving slowly or fidgety/restless 0 0 0 0 0  Suicidal thoughts 0 0 0 0 0  PHQ-9 Score 0 3  3  1  1    Difficult doing work/chores  Not difficult at all Not difficult at all Not difficult at all Not difficult at all     Data saved with a previous flowsheet row definition     Relevant past medical, surgical, family and social history reviewed and updated as indicated. Interim medical history since our last visit reviewed. Allergies and medications reviewed and updated.  Review of Systems  Constitutional: Negative.   Respiratory: Negative.    Cardiovascular: Negative.   Musculoskeletal: Negative.   Skin: Negative.   Psychiatric/Behavioral: Negative.      Per HPI unless specifically indicated above     Objective:  BP 115/76   Pulse 79   Temp 98 F (36.7 C) (Oral)   Ht 5' 4 (1.626 m)   Wt 182 lb (82.6 kg)   SpO2 98%   BMI 31.24 kg/m   Wt Readings from Last 3 Encounters:  05/30/24 182 lb (82.6 kg)  05/02/24 191 lb 12.8 oz (87 kg)  03/13/24 186 lb (84.4 kg)    Physical Exam Vitals and nursing note reviewed.  Constitutional:      General: She is not in acute distress.    Appearance: Normal appearance. She is not ill-appearing, toxic-appearing or diaphoretic.  HENT:     Head: Normocephalic and atraumatic.     Right Ear: External ear normal.     Left Ear: External ear normal.     Nose: Nose normal.     Mouth/Throat:     Mouth: Mucous membranes are moist.     Pharynx: Oropharynx is clear.  Eyes:     General: No scleral  icterus.       Right eye: No discharge.        Left eye: No discharge.     Extraocular Movements: Extraocular movements intact.     Conjunctiva/sclera: Conjunctivae normal.     Pupils: Pupils are equal, round, and reactive to light.  Cardiovascular:     Rate and Rhythm: Normal rate and regular rhythm.     Pulses: Normal pulses.     Heart sounds: Normal heart sounds. No murmur heard.    No friction rub. No gallop.  Pulmonary:     Effort: Pulmonary effort is normal. No respiratory distress.     Breath sounds: Normal breath sounds. No stridor. No wheezing, rhonchi or rales.  Chest:     Chest wall: No tenderness.  Musculoskeletal:        General: Normal range of motion.     Cervical back: Normal range of motion and neck supple.  Skin:    General: Skin is warm and dry.     Capillary Refill: Capillary refill takes less than 2 seconds.     Coloration: Skin is not jaundiced or pale.     Findings: No bruising, erythema, lesion or rash.  Neurological:     General: No focal deficit present.     Mental Status: She is alert and oriented to person, place, and time. Mental status is at baseline.  Psychiatric:        Mood and Affect: Mood normal.        Behavior: Behavior normal.        Thought Content: Thought content normal.        Judgment: Judgment normal.     Results for orders placed or performed in visit on 05/30/24  Bayer DCA Hb A1c Waived   Collection Time: 05/30/24  8:41 AM  Result Value Ref Range   HB A1C (BAYER DCA - WAIVED) 5.5 4.8 - 5.6 %      Assessment & Plan:   Problem List Items Addressed This Visit       Cardiovascular and Mediastinum   Aortic atherosclerosis   Will keep BP, sugars and cholesterol under good control. Continue to monitor. Call with any concerns.       CAD (coronary artery disease)   Will keep her BP, sugars, and cholesterol under good control. Continue current regimen. Continue to monitor.       Relevant Orders   Comprehensive metabolic  panel with GFR   CBC with Differential/Platelet     Respiratory  COPD (chronic obstructive pulmonary disease) (HCC)   Doing well off medicine. Continue to monitor. Call with any concerns.       Relevant Orders   Comprehensive metabolic panel with GFR   CBC with Differential/Platelet     Endocrine   Hyperthyroidism   Rechecking labs today. Await results. Treat as needed.       Relevant Orders   Comprehensive metabolic panel with GFR   CBC with Differential/Platelet   TSH   Type 2 diabetes mellitus with neurological complications (HCC)   Doing great with A1c of 5.5. Continue current regimen. Continue to monitor. Call with any concerns.       Relevant Medications   Semaglutide , 1 MG/DOSE, 4 MG/3ML SOPN   Other Relevant Orders   Comprehensive metabolic panel with GFR   Bayer DCA Hb A1c Waived (Completed)   CBC with Differential/Platelet     Musculoskeletal and Integument   Statin myopathy   Unable to tolerate statins. Continue lovaza. Labs drawn today. Await results.         Other   Hyperlipidemia - Primary   Rechecking labs today. Await results. Treat as needed.       Relevant Orders   Comprehensive metabolic panel with GFR   CBC with Differential/Platelet   Lipid Panel w/o Chol/HDL Ratio   GAD (generalized anxiety disorder)   Under good control on current regimen. Continue current regimen. Continue to monitor. Call with any concerns. Refills given.       Relevant Medications   buPROPion  (WELLBUTRIN  SR) 150 MG 12 hr tablet   nortriptyline  (PAMELOR ) 50 MG capsule   venlafaxine  XR (EFFEXOR -XR) 150 MG 24 hr capsule   Vitamin D  deficiency   Labs drawn today. Await results. Treat as needed.       Relevant Orders   VITAMIN D  25 Hydroxy (Vit-D Deficiency, Fractures)   Other Visit Diagnoses       Needs flu shot       Flu shot given today.   Relevant Orders   Flu vaccine trivalent PF, 6mos and older(Flulaval,Afluria,Fluarix,Fluzone) (Completed)         Follow up plan: Return in about 6 months (around 11/27/2024) for physical.

## 2024-05-30 NOTE — Assessment & Plan Note (Signed)
 Will keep her BP, sugars, and cholesterol under good control. Continue current regimen. Continue to monitor.

## 2024-05-30 NOTE — Assessment & Plan Note (Addendum)
 Under good control on current regimen. Continue current regimen. Continue to monitor. Call with any concerns. Refills given.

## 2024-05-30 NOTE — Assessment & Plan Note (Signed)
 Unable to tolerate statins. Continue lovaza. Labs drawn today. Await results.

## 2024-05-31 LAB — COMPREHENSIVE METABOLIC PANEL WITH GFR
ALT: 21 IU/L (ref 0–32)
AST: 20 IU/L (ref 0–40)
Albumin: 4.3 g/dL (ref 3.9–4.9)
Alkaline Phosphatase: 97 IU/L (ref 49–135)
BUN/Creatinine Ratio: 20 (ref 12–28)
BUN: 16 mg/dL (ref 8–27)
Bilirubin Total: 0.3 mg/dL (ref 0.0–1.2)
CO2: 25 mmol/L (ref 20–29)
Calcium: 9.3 mg/dL (ref 8.7–10.3)
Chloride: 102 mmol/L (ref 96–106)
Creatinine, Ser: 0.82 mg/dL (ref 0.57–1.00)
Globulin, Total: 2.8 g/dL (ref 1.5–4.5)
Glucose: 111 mg/dL — ABNORMAL HIGH (ref 70–99)
Potassium: 4.6 mmol/L (ref 3.5–5.2)
Sodium: 139 mmol/L (ref 134–144)
Total Protein: 7.1 g/dL (ref 6.0–8.5)
eGFR: 81 mL/min/1.73 (ref >59)

## 2024-05-31 LAB — CBC WITH DIFFERENTIAL/PLATELET
Basophils Absolute: 0 x10E3/uL (ref 0.0–0.2)
Basos: 1 %
EOS (ABSOLUTE): 0.1 x10E3/uL (ref 0.0–0.4)
Eos: 3 %
Hematocrit: 36.9 % (ref 34.0–46.6)
Hemoglobin: 11.6 g/dL (ref 11.1–15.9)
Immature Grans (Abs): 0 x10E3/uL (ref 0.0–0.1)
Immature Granulocytes: 0 %
Lymphocytes Absolute: 1.3 x10E3/uL (ref 0.7–3.1)
Lymphs: 25 %
MCH: 28 pg (ref 26.6–33.0)
MCHC: 31.4 g/dL — ABNORMAL LOW (ref 31.5–35.7)
MCV: 89 fL (ref 79–97)
Monocytes Absolute: 0.4 x10E3/uL (ref 0.1–0.9)
Monocytes: 7 %
Neutrophils Absolute: 3.4 x10E3/uL (ref 1.4–7.0)
Neutrophils: 64 %
Platelets: 275 x10E3/uL (ref 150–450)
RBC: 4.14 x10E6/uL (ref 3.77–5.28)
RDW: 13.3 % (ref 11.7–15.4)
WBC: 5.3 x10E3/uL (ref 3.4–10.8)

## 2024-05-31 LAB — TSH: TSH: 1.19 u[IU]/mL (ref 0.450–4.500)

## 2024-05-31 LAB — LIPID PANEL W/O CHOL/HDL RATIO
Cholesterol, Total: 178 mg/dL (ref 100–199)
HDL: 67 mg/dL (ref >39)
LDL Chol Calc (NIH): 91 mg/dL (ref 0–99)
Triglycerides: 116 mg/dL (ref 0–149)
VLDL Cholesterol Cal: 20 mg/dL (ref 5–40)

## 2024-05-31 LAB — VITAMIN D 25 HYDROXY (VIT D DEFICIENCY, FRACTURES): Vit D, 25-Hydroxy: 40.5 ng/mL (ref 30.0–100.0)

## 2024-06-03 ENCOUNTER — Ambulatory Visit: Payer: Self-pay | Admitting: Family Medicine

## 2024-07-09 ENCOUNTER — Ambulatory Visit: Admitting: Internal Medicine

## 2024-07-24 NOTE — Progress Notes (Unsigned)
 "   Name: Carol Becker  MRN/ DOB: [id number], 1960-12-13    Age/ Sex: 64 y.o., female    PCP: Vicci Duwaine SQUIBB, DO   Reason for Endocrinology Evaluation: Hyperthyroidism     Date of Initial Endocrinology Evaluation: 08/09/2022    HPI: Carol Becker is a 64 y.o. 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 The patient presented for initial endocrinology clinic visit on 08/09/2022 for consultative assistance with her Hyperthyroidism.   Patient has been diagnosed with hypothyroidism > 15 yrs ago , she was started on LT-for replacement 25 mcg daily and stayed on it for years.  She discontinued LT-for replacement approximately 2021 due to side effects.  By November, 2021 the patient has been noted with hyperthyroidism and was started on thionamides therapy.  No prior XRT Granddaughter with thyroid  disease  TRAb elevated     ADRENAL HISTORY:  During evaluation for breast cancer in 03/2021 she was noted with adrenal gland adenoma on CT imaging.  CT imaging 08/2021 showed a 1.5 cm indeterminant left adrenal nodule.   Of note, the patient follows with neurology and has chronic vertigo/vestibular migraine    Patient transitioned care from [facility] endocrinology  In 2025 she had normal aldosterone, renin, Aldo/PRA ratio normal, she had appropriate dexamethasone  suppression test with a serum cortisol of 1.2 mcg/DL, she did have elevated plasma norepinephrine (reference [id number]) and dopamine at 35 (reference< 35), in June, 2025    SUBJECTIVE:    Today (07/25/24): Carol Becker is here for follow-up on hyperthyroidism and adrenal adenoma.  Patient follows with [facility] for left breast ductal carcinoma in situ Patient follows with cardiology for hyperlipidemia and dyspnea on exertion, she did have minimal LAD calcifications, and her dyspnea was suspected to be  more deconditioning Patient follows with neurology for vestibular migraines , neuropsychology testing revealed neurocognitive complaints to be more due to underlying anxiety    Patient continues with hot flashes, they have been improving, she initially attributed this to Ozempic  and was off for approximately 6 months which has resulted in weight gain, patient is back on Ozempic  No local neck swelling  No palpitations  Continues with chronic constipation, OTC laxatives aren't always helpful   Continues with constipation - on fiber  No Biotin   Granddaughter who is 87 years old was recently diagnosed with hypothyroidism  Methimazole  5 mg,1.5 tab Monday through Friday, 1 tablet Saturdays and Sundays    HISTORY:  Past Medical History:  Past Medical History:  Diagnosis Date   Adrenal nodule    Anxiety    Arthritis    knees, left thumb, shoulders   Breast cancer (HCC) 03/17/2021   Left   Cervical spinal stenosis    Depression    Family history of breast cancer    GERD (gastroesophageal reflux disease)    History of hypothyroidism    Hyperlipidemia    poss history of   Hyperthyroidism    Hypothyroidism    Osteopenia    PONV (postoperative nausea and vomiting)    after wisdom teeth   Pre-diabetes    Prediabetes    Restless leg syndrome    Sleep apnea    tested ~2012- lost a lot of weight, no longer has sleep apnea.- per patient   Tremors of nervous system    medication related   Ulcer    Vertigo  Vestibular migraine    Past Surgical History:  Past Surgical History:  Procedure Laterality Date   ANTERIOR / POSTERIOR COMBINED FUSION CERVICAL SPINE     BREAST BIOPSY Left 03/15/2021   Stereo bx, Ribbon Clip, path pending   BREAST LUMPECTOMY Left    CHOLECYSTECTOMY N/A 09/01/2022   Procedure: LAPAROSCOPIC CHOLECYSTECTOMY;  Surgeon: Dasie Leonor CROME, MD;  Location: [facility] OR;  Service: General;  Laterality: N/A;   COLONOSCOPY WITH PROPOFOL  N/A 11/05/2020   Procedure:  COLONOSCOPY WITH PROPOFOL ;  Surgeon: Jinny Carmine, MD;  Location[facility];  Service: Endoscopy;  Laterality: N/A;   CYSTECTOMY Left    KNEE ARTHROSCOPY WITH EXCISION BAKER'S CYST Left    LAPAROSCOPY     Exploratory   POLYPECTOMY  11/05/2020   Procedure: POLYPECTOMY;  Surgeon: Jinny Carmine, MD;  Location: [facility];  Service: Endoscopy;;   SHOULDER ARTHROSCOPY WITH ROTATOR CUFF REPAIR Left 12/01/2021   Procedure: Left shoulder arthroscopy, debridement, subacromial decompression, distal clavicle resection, chondroplasty;  Surgeon: Melita Drivers, MD;  Location: [facility];  Service: Orthopedics;  Laterality: Left;    TOTAL MASTECTOMY Bilateral    Per patient Tummy tissue reconstructive surgery same time   TOTAL SHOULDER ARTHROPLASTY Left 03/16/2022   Procedure: TOTAL SHOULDER ARTHROPLASTY;  Surgeon: Melita Drivers, MD;  Location: [facility];  Service: Orthopedics;  Laterality: Left;   trapeziectomy Left    done at [facility] 06/16/20   TUBAL LIGATION  1985   WISDOM TOOTH EXTRACTION      Social History:  reports that she quit smoking about 13 years ago. Her smoking use included cigarettes. She started smoking about 43 years ago. She has never used smokeless tobacco. She reports that she does not currently use alcohol. She reports that she does not use drugs. Family History: family history includes Alzheimer's disease in her maternal grandfather; Bipolar disorder in her daughter; Breast cancer in her cousin; Breast cancer (age of onset: 65) in her paternal grandmother; Breast cancer (age of onset: 88) in her maternal grandmother; Cancer in her maternal grandmother and paternal grandmother; Heart disease in her cousin and father; Heart disease (age of onset: 39) in her sister; Parkinson's disease in her sister.   HOME MEDICATIONS: Allergies as of 07/25/2024       Reactions   Crestor [rosuvastatin Calcium] Other (See Comments)   Indigestion/ increase fatigue   Ezetimibe     Increase  fatigue   Sulfa Antibiotics Nausea Only   Zithromax [azithromycin] Other (See Comments)   Not effective        Medication List        Accurate as of July 25, 2024  8:36 AM. If you have any questions, ask your nurse or doctor.          aspirin EC 81 MG tablet Take 81 mg by mouth daily. Swallow whole.   buPROPion  150 MG 12 hr tablet Commonly known as: Wellbutrin  SR Take 1 tablet (150 mg total) by mouth 2 (two) times daily.   cholestyramine  4 g packet Commonly known as: Questran  Take 1 packet (4 g total) by mouth 3 (three) times daily with meals.   gabapentin  800 MG tablet Commonly known as: Neurontin  Take 2 tablets (1,600 mg total) by mouth at bedtime as needed.   icosapent  Ethyl 1 g capsule Commonly known as: Vascepa  Take 2 capsules (2 g total) by mouth 2 (two) times daily.   meclizine  25 MG tablet Commonly known as: ANTIVERT  Take 1 tablet (25 mg total) by mouth 2 (  two) times daily as needed for dizziness.   meloxicam  15 MG tablet Commonly known as: MOBIC  Take 1 tablet (15 mg total) by mouth daily.   methimazole  5 MG tablet Commonly known as: TAPAZOLE  Take 1.5 tablets (7.5 mg total) by mouth as directed. 1.5 tablets Monday through Friday and 1 tablet on Saturday and Sunday   MULTIVITAMIN PO Take by mouth.   nortriptyline  50 MG capsule Commonly known as: PAMELOR  Take 1 capsule (50 mg total) by mouth at bedtime.   omeprazole  20 MG capsule Commonly known as: PRILOSEC Take 20 mg by mouth daily.   rizatriptan  10 MG disintegrating tablet Commonly known as: MAXALT -MLT Take 1 tablet (10 mg total) by mouth as needed for migraine. May repeat in 2 hours if needed   scopolamine  1 MG/3DAYS Commonly known as: TRANSDERM-SCOP Place 1 patch (1 mg total) onto the skin every 3 (three) days. What changed: additional instructions   Semaglutide  (1 MG/DOSE) 4 MG/3ML Sopn Inject 1 mg as directed once a week.   venlafaxine  XR 150 MG 24 hr capsule Commonly known as:  EFFEXOR -XR Take 1 capsule (150 mg total) by mouth at bedtime.          REVIEW OF SYSTEMS: A comprehensive ROS was conducted with the patient and is negative except as per HPI     OBJECTIVE:  VS: BP 120/70   Pulse 89   Ht 5' 4 (1.626 m)   Wt 197 lb (89.4 kg)   SpO2 97%   BMI 33.81 kg/m    Wt Readings from Last 3 Encounters:  07/25/24 197 lb (89.4 kg)  05/30/24 182 lb (82.6 kg)  05/02/24 191 lb 12.8 oz (87 kg)     EXAM: General: Pt appears well and is in NAD  Neck: General: Supple without adenopathy. Thyroid : Thyroid  size normal.  No goiter or nodules appreciated.  Lungs: Clear with good BS bilat   Heart: Auscultation: RRR.  Abdomen: soft, nontender  Extremities:  BL LE: No pretibial edema   Mental Status: Judgment, insight: Intact Orientation: Oriented to time, place, and person Mood and affect: No depression, anxiety, or agitation     DATA REVIEWED:    Latest Reference Range & Units 07/25/24 09:09  TSH 0.40 - 4.50 mIU/L 0.30 (L)  Triiodothyronine,Free,Serum 2.3 - 4.2 pg/mL 5.0 (H)  T4,Free(Direct) 0.8 - 1.8 ng/dL 1.2      Latest Reference Range & Units 05/30/24 08:41  Sodium 134 - 144 mmol/L 139  Potassium 3.5 - 5.2 mmol/L 4.6  Chloride 96 - 106 mmol/L 102  CO2 20 - 29 mmol/L 25  Glucose 70 - 99 mg/dL 888 (H)  BUN 8 - 27 mg/dL 16  Creatinine 9.42 - 8.99 mg/dL 9.17  Calcium 8.7 - 89.6 mg/dL 9.3  BUN/Creatinine Ratio 12 - 28  20  eGFR >59 mL/min/1.73 81  Alkaline Phosphatase 49 - 135 IU/L 97  Albumin 3.9 - 4.9 g/dL 4.3  AST 0 - 40 IU/L 20  ALT 0 - 32 IU/L 21  Total Protein 6.0 - 8.5 g/dL 7.1  Total Bilirubin 0.0 - 1.2 mg/dL 0.3     Old records , labs and images have been reviewed.    ASSESSMENT/PLAN/RECOMMENDATIONS:   Hyperthyroidism:  -Secondary to Graves' disease - Pt is clinically euthyroid  -No local neck swelling  -I have attempted to decrease methimazole  from 1.5 tabs to 1 tablet daily, but unfortunately her TSH suppressed to  0.04 uIU/mL by  01/2023 -Most recent TSH is low, I will adjust her  methimazole  as below    Medications : Change methimazole  5 mg, 1.5 tab daily 2. Left adrenal Adenoma :  -Adrenal imaging shows stability in 2023 -Aldo, renin, Aldo/renin ratio, catecholamines and 24-hour urinary cortisol where normal in 2024 - The patient with episodes of flushing, excessive sweating in the chest/face area, will proceed with catecholamine testing - Aldo/renin pending today -Patient will return in 2 months for dexamethasone  suppression test  3.  Graves' disease:   -No extrathyroidal manifestations of Graves' disease   F/U in 6 months    Signed electronically by: Stefano Redgie Butts, MD  [facility] Endocrinology  [facility] [address] [place] [address] [place], [place] [address] Phone: [phone] FAX: [phone]   CC: Vicci Duwaine SQUIBB DO [address] [place] [address] Phone: [phone] Fax: [phone]   Return to Endocrinology clinic as below: Future Appointments  Date Time Provider Department Center  11/28/2024  8:40 AM Vicci Duwaine SQUIBB, DO [facility] [address]  03/19/2025  8:45 AM Whitfield Raisin, NP [facility] None        "

## 2024-07-25 ENCOUNTER — Encounter: Payer: Self-pay | Admitting: Internal Medicine

## 2024-07-25 ENCOUNTER — Ambulatory Visit: Admitting: Internal Medicine

## 2024-07-25 ENCOUNTER — Other Ambulatory Visit

## 2024-07-25 VITALS — BP 120/70 | HR 89 | Ht 64.0 in | Wt 197.0 lb

## 2024-07-25 DIAGNOSIS — D3502 Benign neoplasm of left adrenal gland: Secondary | ICD-10-CM

## 2024-07-25 DIAGNOSIS — E0590 Thyrotoxicosis, unspecified without thyrotoxic crisis or storm: Secondary | ICD-10-CM | POA: Diagnosis not present

## 2024-07-25 DIAGNOSIS — E0500 Thyrotoxicosis with diffuse goiter without thyrotoxic crisis or storm: Secondary | ICD-10-CM

## 2024-07-25 LAB — T3, FREE: T3, Free: 5 pg/mL — ABNORMAL HIGH (ref 2.3–4.2)

## 2024-07-25 LAB — T4, FREE: Free T4: 1.2 ng/dL (ref 0.8–1.8)

## 2024-07-25 LAB — TSH: TSH: 0.3 m[IU]/L — ABNORMAL LOW (ref 0.40–4.50)

## 2024-07-25 NOTE — Patient Instructions (Signed)
 24-Hour Urine Collection   You will be collecting your urine for a 24-hour period of time.  Your timer starts with your first urine of the morning (For example - If you first pee at 9AM, your timer will start at 9AM)  Throw away your first urine of the morning  Collect your urine every time you pee for the next 24 hours STOP your urine collection 24 hours after you started the collection (For example - You would stop at 9AM the day after you started)

## 2024-07-28 ENCOUNTER — Ambulatory Visit: Payer: Self-pay | Admitting: Internal Medicine

## 2024-07-28 MED ORDER — METHIMAZOLE 5 MG PO TABS: 7.5000 mg | ORAL_TABLET | Freq: Every day | ORAL | 2 refills | Status: AC

## 2024-11-28 ENCOUNTER — Encounter: Admitting: Family Medicine

## 2025-01-23 ENCOUNTER — Ambulatory Visit: Admitting: Internal Medicine

## 2025-03-19 ENCOUNTER — Ambulatory Visit: Admitting: Adult Health
